# Patient Record
Sex: Male | Born: 1975 | Race: Black or African American | Hispanic: No | Marital: Single | Smoking: Current some day smoker
Health system: Southern US, Community
[De-identification: ages and names within clinical notes are randomized; demographics above are authoritative.]

## PROBLEM LIST (undated history)

## (undated) DIAGNOSIS — F1910 Other psychoactive substance abuse, uncomplicated: Secondary | ICD-10-CM

## (undated) DIAGNOSIS — F32A Depression, unspecified: Secondary | ICD-10-CM

## (undated) DIAGNOSIS — E669 Obesity, unspecified: Secondary | ICD-10-CM

## (undated) DIAGNOSIS — R7989 Other specified abnormal findings of blood chemistry: Secondary | ICD-10-CM

## (undated) DIAGNOSIS — F419 Anxiety disorder, unspecified: Secondary | ICD-10-CM

## (undated) DIAGNOSIS — F1410 Cocaine abuse, uncomplicated: Secondary | ICD-10-CM

## (undated) DIAGNOSIS — G35 Multiple sclerosis: Secondary | ICD-10-CM

## (undated) DIAGNOSIS — R079 Chest pain, unspecified: Secondary | ICD-10-CM

## (undated) DIAGNOSIS — G4730 Sleep apnea, unspecified: Secondary | ICD-10-CM

## (undated) DIAGNOSIS — I509 Heart failure, unspecified: Secondary | ICD-10-CM

## (undated) DIAGNOSIS — F17200 Nicotine dependence, unspecified, uncomplicated: Secondary | ICD-10-CM

## (undated) HISTORY — DX: Cocaine abuse, uncomplicated: F14.10

## (undated) HISTORY — DX: Sleep apnea, unspecified: G47.30

## (undated) HISTORY — DX: Other specified abnormal findings of blood chemistry: R79.89

## (undated) HISTORY — DX: Chest pain, unspecified: R07.9

## (undated) HISTORY — DX: Heart failure, unspecified: I50.9

## (undated) HISTORY — DX: Nicotine dependence, unspecified, uncomplicated: F17.200

## (undated) HISTORY — DX: Depression, unspecified: F32.A

## (undated) HISTORY — DX: Anxiety disorder, unspecified: F41.9

---

## 2001-06-05 ENCOUNTER — Emergency Department (HOSPITAL_COMMUNITY): Admission: EM | Admit: 2001-06-05 | Discharge: 2001-06-06 | Payer: Self-pay | Admitting: Emergency Medicine

## 2001-06-05 ENCOUNTER — Encounter: Payer: Self-pay | Admitting: Emergency Medicine

## 2001-08-30 ENCOUNTER — Emergency Department (HOSPITAL_COMMUNITY): Admission: EM | Admit: 2001-08-30 | Discharge: 2001-08-31 | Payer: Self-pay | Admitting: Emergency Medicine

## 2001-08-31 ENCOUNTER — Other Ambulatory Visit (HOSPITAL_COMMUNITY): Admission: RE | Admit: 2001-08-31 | Discharge: 2001-09-14 | Payer: Self-pay | Admitting: Psychiatry

## 2002-01-28 ENCOUNTER — Emergency Department (HOSPITAL_COMMUNITY): Admission: EM | Admit: 2002-01-28 | Discharge: 2002-01-28 | Payer: Self-pay | Admitting: Emergency Medicine

## 2002-01-28 ENCOUNTER — Encounter: Payer: Self-pay | Admitting: Emergency Medicine

## 2009-05-20 ENCOUNTER — Inpatient Hospital Stay: Payer: Self-pay | Admitting: Internal Medicine

## 2014-12-01 ENCOUNTER — Emergency Department: Payer: Self-pay | Admitting: Emergency Medicine

## 2015-05-25 ENCOUNTER — Encounter: Payer: Self-pay | Admitting: Emergency Medicine

## 2015-05-25 ENCOUNTER — Inpatient Hospital Stay
Admission: EM | Admit: 2015-05-25 | Discharge: 2015-05-28 | DRG: 287 | Disposition: A | Discharge status: Home / Self Care | Payer: Self-pay | Attending: Internal Medicine | Admitting: Internal Medicine

## 2015-05-25 ENCOUNTER — Inpatient Hospital Stay: Admit: 2015-05-25 | Payer: MEDICAID

## 2015-05-25 ENCOUNTER — Inpatient Hospital Stay (HOSPITAL_COMMUNITY)
Admit: 2015-05-25 | Discharge: 2015-05-25 | Disposition: A | Discharge status: Home / Self Care | Payer: Self-pay | Attending: Internal Medicine | Admitting: Internal Medicine

## 2015-05-25 ENCOUNTER — Emergency Department: Payer: Self-pay

## 2015-05-25 DIAGNOSIS — I739 Peripheral vascular disease, unspecified: Principal | ICD-10-CM | POA: Diagnosis present

## 2015-05-25 DIAGNOSIS — I214 Non-ST elevation (NSTEMI) myocardial infarction: Secondary | ICD-10-CM | POA: Diagnosis present

## 2015-05-25 DIAGNOSIS — J9801 Acute bronchospasm: Secondary | ICD-10-CM | POA: Diagnosis present

## 2015-05-25 DIAGNOSIS — Z8249 Family history of ischemic heart disease and other diseases of the circulatory system: Secondary | ICD-10-CM

## 2015-05-25 DIAGNOSIS — F17200 Nicotine dependence, unspecified, uncomplicated: Secondary | ICD-10-CM | POA: Insufficient documentation

## 2015-05-25 DIAGNOSIS — R079 Chest pain, unspecified: Secondary | ICD-10-CM | POA: Insufficient documentation

## 2015-05-25 DIAGNOSIS — R778 Other specified abnormalities of plasma proteins: Secondary | ICD-10-CM | POA: Insufficient documentation

## 2015-05-25 DIAGNOSIS — I209 Angina pectoris, unspecified: Secondary | ICD-10-CM | POA: Insufficient documentation

## 2015-05-25 DIAGNOSIS — I7389 Other specified peripheral vascular diseases: Secondary | ICD-10-CM | POA: Diagnosis present

## 2015-05-25 DIAGNOSIS — R7989 Other specified abnormal findings of blood chemistry: Secondary | ICD-10-CM | POA: Insufficient documentation

## 2015-05-25 DIAGNOSIS — F17210 Nicotine dependence, cigarettes, uncomplicated: Secondary | ICD-10-CM | POA: Diagnosis present

## 2015-05-25 DIAGNOSIS — Z6834 Body mass index (BMI) 34.0-34.9, adult: Secondary | ICD-10-CM

## 2015-05-25 DIAGNOSIS — R739 Hyperglycemia, unspecified: Secondary | ICD-10-CM | POA: Diagnosis present

## 2015-05-25 DIAGNOSIS — I10 Essential (primary) hypertension: Secondary | ICD-10-CM | POA: Diagnosis present

## 2015-05-25 DIAGNOSIS — G35 Multiple sclerosis: Secondary | ICD-10-CM | POA: Diagnosis present

## 2015-05-25 DIAGNOSIS — R748 Abnormal levels of other serum enzymes: Secondary | ICD-10-CM | POA: Diagnosis present

## 2015-05-25 DIAGNOSIS — Z716 Tobacco abuse counseling: Secondary | ICD-10-CM | POA: Diagnosis present

## 2015-05-25 DIAGNOSIS — F1410 Cocaine abuse, uncomplicated: Secondary | ICD-10-CM | POA: Diagnosis present

## 2015-05-25 DIAGNOSIS — F1210 Cannabis abuse, uncomplicated: Secondary | ICD-10-CM | POA: Diagnosis present

## 2015-05-25 HISTORY — DX: Obesity, unspecified: E66.9

## 2015-05-25 HISTORY — DX: Other psychoactive substance abuse, uncomplicated: F19.10

## 2015-05-25 HISTORY — DX: Multiple sclerosis: G35

## 2015-05-25 HISTORY — DX: Non-ST elevation (NSTEMI) myocardial infarction: I21.4

## 2015-05-25 LAB — COMPREHENSIVE METABOLIC PANEL
ALT: 22 U/L (ref 17–63)
AST: 26 U/L (ref 15–41)
Albumin: 3.4 g/dL — ABNORMAL LOW (ref 3.5–5.0)
Alkaline Phosphatase: 51 U/L (ref 38–126)
Anion gap: 13 (ref 5–15)
BUN: 12 mg/dL (ref 6–20)
CO2: 21 mmol/L — ABNORMAL LOW (ref 22–32)
Calcium: 8.7 mg/dL — ABNORMAL LOW (ref 8.9–10.3)
Chloride: 105 mmol/L (ref 101–111)
Creatinine, Ser: 1.07 mg/dL (ref 0.61–1.24)
GFR calc Af Amer: >60 mL/min (ref >60)
GFR calc non Af Amer: >60 mL/min (ref >60)
Glucose, Bld: 168 mg/dL — ABNORMAL HIGH (ref 65–99)
Potassium: 3.2 mmol/L — ABNORMAL LOW (ref 3.5–5.1)
Sodium: 139 mmol/L (ref 135–145)
Total Bilirubin: 0.3 mg/dL (ref 0.3–1.2)
Total Protein: 6.4 g/dL — ABNORMAL LOW (ref 6.5–8.1)

## 2015-05-25 LAB — URINE DRUG SCREEN, QUALITATIVE (ARMC ONLY)
Amphetamines, Ur Screen: NOT DETECTED
BARBITURATES, UR SCREEN: NOT DETECTED
Benzodiazepine, Ur Scrn: NOT DETECTED
COCAINE METABOLITE, UR ~~LOC~~: POSITIVE — AB
Cannabinoid 50 Ng, Ur ~~LOC~~: POSITIVE — AB
MDMA (ECSTASY) UR SCREEN: NOT DETECTED
Methadone Scn, Ur: NOT DETECTED
OPIATE, UR SCREEN: NOT DETECTED
Phencyclidine (PCP) Ur S: NOT DETECTED
Tricyclic, Ur Screen: NOT DETECTED

## 2015-05-25 LAB — TROPONIN I
TROPONIN I: 7.85 ng/mL — AB (ref <0.031)
Troponin I: <0.03 ng/mL (ref <0.031)
Troponin I: 0.13 ng/mL — ABNORMAL HIGH (ref <0.031)

## 2015-05-25 LAB — CBC
HCT: 46.9 % (ref 40.0–52.0)
Hemoglobin: 15.5 g/dL (ref 13.0–18.0)
MCH: 28.1 pg (ref 26.0–34.0)
MCHC: 33 g/dL (ref 32.0–36.0)
MCV: 85 fL (ref 80.0–100.0)
Platelets: 274 10*3/uL (ref 150–440)
RBC: 5.52 MIL/uL (ref 4.40–5.90)
RDW: 14.2 % (ref 11.5–14.5)
WBC: 14.3 10*3/uL — ABNORMAL HIGH (ref 3.8–10.6)

## 2015-05-25 LAB — HEMOGLOBIN A1C: HEMOGLOBIN A1C: 5.2 % (ref 4.0–6.0)

## 2015-05-25 LAB — HEPARIN LEVEL (UNFRACTIONATED): Heparin Unfractionated: 0.18 IU/mL — ABNORMAL LOW (ref 0.30–0.70)

## 2015-05-25 LAB — GLUCOSE, CAPILLARY
Glucose-Capillary: 100 mg/dL — ABNORMAL HIGH (ref 65–99)
Glucose-Capillary: 93 mg/dL (ref 65–99)

## 2015-05-25 LAB — PROTIME-INR
INR: 0.9
PROTHROMBIN TIME: 12.4 s (ref 11.4–15.0)

## 2015-05-25 LAB — APTT: aPTT: 34 seconds (ref 24–36)

## 2015-05-25 MED ORDER — METOPROLOL SUCCINATE ER 25 MG PO TB24
25.0000 mg | ORAL_TABLET | Freq: Every day | ORAL | Status: DC
  Administered 2015-05-25 (×2): 25 mg via ORAL
  Filled 2015-05-25 (×2): qty 1

## 2015-05-25 MED ORDER — ACETAMINOPHEN 650 MG RE SUPP: 650.0000 mg | Freq: Four times a day (QID) | RECTAL | Status: DC | PRN

## 2015-05-25 MED ORDER — POTASSIUM CHLORIDE CRYS ER 20 MEQ PO TBCR
40.0000 meq | EXTENDED_RELEASE_TABLET | ORAL | Status: AC
  Administered 2015-05-25 (×2): 40 meq via ORAL
  Filled 2015-05-25 (×2): qty 2

## 2015-05-25 MED ORDER — NITROGLYCERIN 2 % TD OINT
1.0000 [in_us] | TOPICAL_OINTMENT | Freq: Once | TRANSDERMAL | Status: AC
  Administered 2015-05-25: 1 [in_us] via TOPICAL
  Filled 2015-05-25: qty 1

## 2015-05-25 MED ORDER — HEPARIN BOLUS VIA INFUSION
4000.0000 [IU] | Freq: Once | INTRAVENOUS | Status: AC
  Administered 2015-05-25: 4000 [IU] via INTRAVENOUS
  Filled 2015-05-25: qty 4000

## 2015-05-25 MED ORDER — ACETAMINOPHEN 325 MG PO TABS: 650.0000 mg | ORAL_TABLET | Freq: Four times a day (QID) | ORAL | Status: DC | PRN

## 2015-05-25 MED ORDER — HEPARIN (PORCINE) IN NACL 100-0.45 UNIT/ML-% IJ SOLN
1850.0000 [IU]/h | INTRAMUSCULAR | Status: DC
  Administered 2015-05-25 (×2): 1450 [IU]/h via INTRAVENOUS
  Filled 2015-05-25 (×5): qty 250

## 2015-05-25 MED ORDER — ASPIRIN EC 81 MG PO TBEC
81.0000 mg | DELAYED_RELEASE_TABLET | Freq: Every day | ORAL | Status: DC
  Administered 2015-05-26 – 2015-05-28 (×3): 81 mg via ORAL
  Filled 2015-05-25 (×3): qty 1

## 2015-05-25 MED ORDER — DOCUSATE SODIUM 100 MG PO CAPS
100.0000 mg | ORAL_CAPSULE | Freq: Two times a day (BID) | ORAL | Status: DC
  Administered 2015-05-25 – 2015-05-28 (×3): 100 mg via ORAL
  Filled 2015-05-25 (×4): qty 1

## 2015-05-25 MED ORDER — ZOLPIDEM TARTRATE 5 MG PO TABS
5.0000 mg | ORAL_TABLET | Freq: Every evening | ORAL | Status: DC | PRN
  Administered 2015-05-25: 5 mg via ORAL
  Filled 2015-05-25: qty 1

## 2015-05-25 MED ORDER — SODIUM CHLORIDE 0.9 % IJ SOLN
3.0000 mL | Freq: Two times a day (BID) | INTRAMUSCULAR | Status: DC
  Administered 2015-05-28: 3 mL via INTRAVENOUS

## 2015-05-25 MED ORDER — FAMOTIDINE 20 MG PO TABS
20.0000 mg | ORAL_TABLET | Freq: Once | ORAL | Status: AC
  Administered 2015-05-25: 20 mg via ORAL
  Filled 2015-05-25: qty 1

## 2015-05-25 MED ORDER — INSULIN ASPART 100 UNIT/ML ~~LOC~~ SOLN
0.0000 [IU] | Freq: Three times a day (TID) | SUBCUTANEOUS | Status: DC
  Filled 2015-05-25: qty 3

## 2015-05-25 MED ORDER — GI COCKTAIL ~~LOC~~
30.0000 mL | Freq: Once | ORAL | Status: AC
  Administered 2015-05-25: 30 mL via ORAL
  Filled 2015-05-25: qty 30

## 2015-05-25 MED ORDER — MORPHINE SULFATE 2 MG/ML IJ SOLN: 2.0000 mg | INTRAMUSCULAR | Status: DC | PRN

## 2015-05-25 MED ORDER — HEPARIN BOLUS VIA INFUSION
3400.0000 [IU] | Freq: Once | INTRAVENOUS | Status: AC
  Administered 2015-05-25: 3400 [IU] via INTRAVENOUS
  Filled 2015-05-25: qty 3400

## 2015-05-25 MED ORDER — SODIUM CHLORIDE 0.9 % IV SOLN: 250.0000 mL | INTRAVENOUS | Status: DC | PRN

## 2015-05-25 MED ORDER — NITROGLYCERIN 0.4 MG SL SUBL: 0.4000 mg | SUBLINGUAL_TABLET | SUBLINGUAL | Status: DC | PRN

## 2015-05-25 MED ORDER — ONDANSETRON HCL 4 MG PO TABS: 4.0000 mg | ORAL_TABLET | Freq: Four times a day (QID) | ORAL | Status: DC | PRN

## 2015-05-25 MED ORDER — SODIUM CHLORIDE 0.9 % IJ SOLN: 3.0000 mL | INTRAMUSCULAR | Status: DC | PRN

## 2015-05-25 MED ORDER — PRAVASTATIN SODIUM 20 MG PO TABS
40.0000 mg | ORAL_TABLET | Freq: Every day | ORAL | Status: DC
  Administered 2015-05-25 – 2015-05-26 (×2): 40 mg via ORAL
  Filled 2015-05-25 (×2): qty 2

## 2015-05-25 MED ORDER — ONDANSETRON HCL 4 MG/2ML IJ SOLN: 4.0000 mg | Freq: Four times a day (QID) | INTRAMUSCULAR | Status: DC | PRN

## 2015-05-25 MED ORDER — ASPIRIN 81 MG PO CHEW
324.0000 mg | CHEWABLE_TABLET | Freq: Once | ORAL | Status: AC
  Administered 2015-05-25: 324 mg via ORAL
  Filled 2015-05-25: qty 4

## 2015-05-25 MED ORDER — ALBUTEROL SULFATE (2.5 MG/3ML) 0.083% IN NEBU: 2.5000 mg | INHALATION_SOLUTION | RESPIRATORY_TRACT | Status: DC | PRN

## 2015-05-25 NOTE — ED Provider Notes (Addendum)
[facility] Emergency Department Provider Note  ____________________________________________  Time seen: 8:15 AM  I have reviewed the triage vital signs and the nursing notes.   HISTORY  Chief Complaint Chest Pain     HPI Ricardo Yang is a 38 y.o. male who awoke with chest pain at 5 AM this morning.He was feeling well before bed last night as well as yesterday. The pain is in the center of his chest. It radiates to his back. It is waxing and waning some, but remains present since onset this morning. The severity waxes and wanes between mild and moderate. The patient describes this as a burning and dull ache. It became worse when he lay back on his bed flat. It is not worsened by being upright, ambulating, or moving his arms. He does have some mild nausea but no shortness of breath.  The patient reports he was diagnosed with "possible multiple sclerosis" 5 years ago when he had paresthesias for a few days time. He has had no symptoms since.  Past Medical History  Diagnosis Date  . Multiple sclerosis     There are no active problems to display for this patient.   History reviewed. No pertinent past surgical history.  No current outpatient prescriptions on file.  Allergies Review of patient's allergies indicates no known allergies.  History reviewed. No pertinent family history.  Social History History  Substance Use Topics  . Smoking status: Current Every Day Smoker  . Smokeless tobacco: Not on file  . Alcohol Use: No    Review of Systems  Constitutional: Negative for fever. ENT: Negative for sore throat. Cardiovascular: Positive for chest pain. See history of present illness Respiratory: Negative for shortness of breath. Gastrointestinal: Negative for abdominal pain, vomiting and diarrhea. Genitourinary: Negative for dysuria. Musculoskeletal: No myalgias or injuries. Skin: Negative for rash. Neurological: Negative for  headaches   10-point ROS otherwise negative.  ____________________________________________   PHYSICAL EXAM:  VITAL SIGNS: ED Triage Vitals  Enc Vitals Group     BP 05/25/15 0627 154/94 mmHg     Pulse Rate 05/25/15 0627 81     Resp 05/25/15 0627 28     Temp 05/25/15 0627 97.3 F (36.3 C)     Temp Source 05/25/15 0627 Oral     SpO2 05/25/15 0627 100 %     Weight 05/25/15 0627 281 lb (127.461 kg)     Height 05/25/15 0627  (1.93 m)     Head Cir --      Peak Flow --      Pain Score 05/25/15 0628 8     Pain Loc --      Pain Edu? --      Excl. in GC? --     Constitutional:  Alert and oriented. Calm and communicative with mild discomfort.Marland Kitchen ENT   Head: Normocephalic and atraumatic.   Nose: No congestion/rhinnorhea.   Mouth/Throat: Mucous membranes are moist. Cardiovascular: Normal rate, regular rhythm, no murmur noted Respiratory:  Normal respiratory effort, no tachypnea.    Breath sounds are clear and equal bilaterally.  Chest wall: Nontender. Gastrointestinal: Soft and nontender. No distention.  Back: No muscle spasm, no tenderness, no CVA tenderness. Musculoskeletal: No deformity noted. Nontender with normal range of motion in all extremities.  No noted edema. Neurologic:  Normal speech and language. No gross focal neurologic deficits are appreciated.  Skin:  Skin is warm, dry. No rash noted. Psychiatric: Mood and affect are normal. Speech and behavior are normal.  ____________________________________________    LABS (pertinent positives/negatives)  Labs Reviewed  CBC - Abnormal; Notable for the following:    WBC 14.3 (*)    All other components within normal limits  COMPREHENSIVE METABOLIC PANEL - Abnormal; Notable for the following:    Potassium 3.2 (*)    CO2 21 (*)    Glucose, Bld 168 (*)    Calcium 8.7 (*)    Total Protein 6.4 (*)    Albumin 3.4 (*)    All other components within normal limits  TROPONIN I - Abnormal; Notable for the  following:    Troponin I 0.13 (*)    All other components within normal limits  TROPONIN I     ____________________________________________   EKG  ED ECG REPORT I, Makaelyn Aponte W, the attending physician, personally viewed and interpreted this ECG.   Date: 05/25/2015  EKG Time: 6:33 AM  Rate: 77  Rhythm: Normal sinus rhythm with sinus arrhythmia and incomplete right bundle-branch block  Axis: Normal  Intervals: Normal  ST&T Change: None noted   ED ECG REPORT I, Deniz Eskridge W, the attending physician, personally viewed and interpreted this ECG.   Date: 05/25/2015  EKG Time: 10:54 AM  Rate: 65  Rhythm: Normal sinus rhythm  Axis: Normal  Intervals: Normal  ST&T Change: None noted   ____________________________________________    RADIOLOGY  Chest x-ray:  IMPRESSION: No acute pulmonary process.  ____________________________________________   PROCEDURES  CRITICAL CARE Performed by: Darien Ramus   Total critical care time: 30 min due to the increase involvement and critical care of a non-STEMI situation including discussions with cardiology, internal medicine, and arrangements for admission.  Critical care time was exclusive of separately billable procedures and treating other patients.  Critical care was necessary to treat or prevent imminent or life-threatening deterioration.  Critical care was time spent personally by me on the following activities: development of treatment plan with patient and/or surrogate as well as nursing, discussions with consultants, evaluation of patient's response to treatment, examination of patient, obtaining history from patient or surrogate, ordering and performing treatments and interventions, ordering and review of laboratory studies, ordering and review of radiographic studies, pulse oximetry and re-evaluation of patient's condition.   ____________________________________________   INITIAL IMPRESSION / ASSESSMENT  AND PLAN / ED COURSE  Pertinent labs & imaging results that were available during my care of the patient were reviewed by me and considered in my medical decision making (see chart for details).  39 year old male with chest pain that began while he was sleeping and noted when he awoke at 5 AM. It was worse when he lay down again. It is not worse with activity or ambulation. I suspect the patient may have esophageal irritation.  The patient does smoke cigarettes. We have discussed this as a risk factor for cardiac disease and I have encouraged him to quit.  The patient's father had some heart problems began when he was 41 years old.  The patient's initial EKG did not show any worrisome signs for cardiac ischemia. His initial troponin was negative. We will obtain a second troponin, 6 hours after the onset of symptoms, at 11 AM this morning.  We will treat the patient with a GI cocktail for the suspected esophageal irritation.   ----------------------------------------- 11:46 AM on 05/25/2015 -----------------------------------------  The patient felt better initially after GI cocktail. He slept for a little bit. I reassessed him last at approximately 11:15. At that time he said his chest pain began to come back after  swallowing the famotidine tablet. A repeat EKG at 1054 was normal. A serial troponin measurement was pending at that time.  The second troponin has now returned elevated compared to the first at 0.13.  We will provide aspirin and moved towards admission for this chest pain situation which now more clearly appears to be cardiac.  ----------------------------------------- 12:02 PM on 05/25/2015 -----------------------------------------  Discussed case with Dr. Rachel Moulds. He advises to start heparin and admitted to the hospital as planned. He will discuss the case with Dr. Mariah Milling.  I've called and spoke with Dr. Marlene Bast. He will see the patient the emergency department for  admission. ____________________________________________   FINAL CLINICAL IMPRESSION(S) / ED DIAGNOSES  Final diagnoses:  Non-STEMI (non-ST elevated myocardial infarction)  Ischemic chest pain      Darien Ramus, MD 05/25/15 1204  Darien Ramus, MD 05/25/15 [phone]

## 2015-05-25 NOTE — Consult Note (Signed)
ANTICOAGULATION CONSULT NOTE - Initial Consult  Pharmacy Consult for Heparin Indication: chest pain/ACS  No Known Allergies  Patient Measurements: Height: 6\' 4"  (193 cm) Weight: 281 lb (127.461 kg) IBW/kg (Calculated) : 86.8 Heparin Dosing Weight: 114kg  Vital Signs: Temp: 97.3 F (36.3 C) (07/29 0627) Temp Source: Oral (07/29 0627) BP: 155/94 mmHg (07/29 1153) Pulse Rate: 62 (07/29 1153)  Labs:  Recent Labs  05/25/15 0637 05/25/15 1059  HGB 15.5  --   HCT 46.9  --   PLT 274  --   CREATININE 1.07  --   TROPONINI <0.03 0.13*    Estimated Creatinine Clearance: 132.5 mL/min (by C-G formula based on Cr of 1.07).   Medical History: Past Medical History  Diagnosis Date  . Multiple sclerosis     Medications:  Scheduled:  . [START ON 05/26/2015] aspirin EC  81 mg Oral Daily  . docusate sodium  100 mg Oral BID  . metoprolol succinate  25 mg Oral Daily  . potassium chloride  40 mEq Oral 6 times per day  . pravastatin  40 mg Oral q1800  . sodium chloride  3 mL Intravenous Q12H   Infusions:  . sodium chloride      Assessment: Pt is a 39 year old male with ACS/STEMI Goal of Therapy:  Heparin level 0.3-0.7 units/ml Monitor platelets by anticoagulation protocol: Yes   Plan:  Give 4000 units bolus x 1 Start heparin infusion at 1450 units/hr Check anti-Xa level in 6 hours and daily while on heparin Continue to monitor H&H and platelets  Ricardo Yang D Ricardo Yang 05/25/2015,12:05 PM

## 2015-05-25 NOTE — Progress Notes (Signed)
*  PRELIMINARY RESULTS* Echocardiogram 2D Echocardiogram has been performed.  Garrel Ridgel Stills 05/25/2015, 3:57 PM

## 2015-05-25 NOTE — Progress Notes (Signed)
Notified Dr. Clint Guy of troponin of 7.85. Patient already on heparin drip.

## 2015-05-25 NOTE — Consult Note (Addendum)
Cardiology Consultation Note  Patient ID: Ricardo Yang, MRN: [id number], DOB/AGE: 41/16/1977 41 y.o. Admit date: 05/25/2015   Date of Consult: 05/25/2015 Primary Physician: No PCP Per Patient Primary Cardiologist: New to [facility]  Chief Complaint: Chest pain Reason for Consult: NSTEMI  HPI: 39 y.o. male with h/o polysubstance abuse, obesity, and possible MS who presented to [facility] ED on 7/29 with sudden onset of chest pain and was found to have NSTEMI.   He does not have any previously known cardiac history and has never seen a cardiologist before. No prior echo, stress test, or cardiac catheterization. 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 He does have a history of polysubstance abuse with history of crack/cocaine abuse, most recently snorting cocaine 2 weeks ago. He also has a history ongoing marijuana abuse, most recently using this morning. He has a remote history of ecstasy and pills and reports he would previously use anything other than injection drugs. He previously smoked 2 packs of cigarettes daily and now smokes less than 1 pack daily. He has not seen a doctor in the outpatient setting since at least his high school years.   He was woken up this morning with substernal chest pain that radiated to his left arm. There was associated nausea, vomiting, and diaphoresis. Pain has been at a constant 3/10 and will intermittently increase to an 8/10. Pain is described as a dull ache. He also reports  Some mild nasal congestion that is now improved. Because of this he presented to [facility].   Upon his arrival to [facility] ED he was found to have a troponin of <0.03-->0.13. EKG NSR, 77 bpm, incomplete RBBB, TWI III, aVF, nonspecific st/t changes lateral leads, K+ 3.2, SCr 1.07, WBC 14.3. CXR no acute process. He was given potassium repletion and started on heparin gtt. He as also started on metoprolol, aspirin,  pravastatin, and SSI. His chest pain has currently resolved.       Past Medical History  Diagnosis Date  . Multiple sclerosis     a. question possible MS  . Obesity   . Polysubstance abuse     a. tobacco, cocaine, crack, ecstasy, pills, "anything but injections"      Most Recent Cardiac Studies: None   Surgical History: History reviewed. No pertinent past surgical history.   Home Meds: Prior to Admission medications   Not on File    Inpatient Medications:  . [START ON 05/26/2015] aspirin EC  81 mg Oral Daily  . docusate sodium  100 mg Oral BID  . heparin  4,000 Units Intravenous Once  . insulin aspart  0-9 Units Subcutaneous TID WC  . metoprolol succinate  25 mg Oral Daily  . potassium chloride  40 mEq Oral 6 times per day  . pravastatin  40 mg Oral q1800  . sodium chloride  3 mL Intravenous Q12H   . heparin      Allergies: No Known Allergies  History   Social History  . Marital Status: Single    Spouse Name: N/A  . Number of Children: N/A  . Years of Education: N/A   Occupational History  . Not on file.   Social History Main Topics  . Smoking status: Current Every Day Smoker  . Smokeless tobacco: Not on file  . Alcohol Use: No  . Drug Use: 1.00 per week    Special: "Crack" cocaine, Amphetamines, Cocaine, Hashish, Marijuana,  MDMA (Ecstacy)  . Sexual Activity:    Partners: Female   Other Topics Concern  . Not on file   Social History Narrative     Family History  Problem Relation Age of Onset  . Heart disease Father      Review of Systems: Review of Systems  Constitutional: Positive for weight loss, malaise/fatigue and diaphoresis. Negative for fever and chills.  HENT: Positive for congestion.   Eyes: Negative for blurred vision, discharge and redness.  Respiratory: Negative for cough, hemoptysis, sputum production, shortness of breath and wheezing.   Cardiovascular: Positive for chest pain. Negative for palpitations, orthopnea, claudication,  leg swelling and PND.  Gastrointestinal: Positive for nausea and vomiting. Negative for heartburn and abdominal pain.  Musculoskeletal: Negative for myalgias and falls.  Skin: Negative for rash.  Neurological: Positive for tingling and weakness. Negative for dizziness, tremors, sensory change, speech change, focal weakness, seizures, loss of consciousness and headaches.       Bilateral upper extremity paraesthesias   Endo/Heme/Allergies: Does not bruise/bleed easily.  Psychiatric/Behavioral: Positive for substance abuse. The patient is not nervous/anxious.   All other systems reviewed and are negative.    Labs:  Recent Labs  05/25/15 0637 05/25/15 1059  TROPONINI <0.03 0.13*   Lab Results  Component Value Date   WBC 14.3* 05/25/2015   HGB 15.5 05/25/2015   HCT 46.9 05/25/2015   MCV 85.0 05/25/2015   PLT 274 05/25/2015     Recent Labs Lab 05/25/15 0637  NA 139  K 3.2*  CL 105  CO2 21*  BUN 12  CREATININE 1.07  CALCIUM 8.7*  PROT 6.4*  BILITOT 0.3  ALKPHOS 51  ALT 22  AST 26  GLUCOSE 168*   No results found for: CHOL, HDL, LDLCALC, TRIG No results found for: DDIMER  Radiology/Studies:  Dg Chest 2 View  05/25/2015   CLINICAL DATA:  Chest pain for 2-1/2 hours. Pain radiating to the back. Left arm weakness.  EXAM: CHEST  2 VIEW  COMPARISON:  05/20/2009  FINDINGS: The cardiomediastinal contours are normal. The lungs are clear. Pulmonary vasculature is normal. No consolidation, pleural effusion, or pneumothorax. No acute osseous abnormalities are seen.  IMPRESSION: No acute pulmonary process.   Electronically Signed   By: Rubye Oaks M.D.   On: 05/25/2015 06:58    EKG: NSR, 77 bpm, incomplete RBBB, TWI III, aVF, nonspecific st/t changes lateral leads  Weights: Filed Weights   05/25/15 0627  Weight: 281 lb (127.461 kg)     Physical Exam: Blood pressure 157/90, pulse 83, temperature 98.2 F (36.8 C), temperature source Oral, resp. rate 17, height 6\' 4"   (1.93 m), weight 281 lb (127.461 kg), SpO2 99 %. Body mass index is 34.22 kg/(m^2). General: Well developed, well nourished, in no acute distress. Head: Normocephalic, atraumatic, sclera non-icteric, no xanthomas, nares are without discharge.  Neck: Negative for carotid bruits. JVD not elevated. Lungs: Clear bilaterally to auscultation without wheezes, rales, or rhonchi. Breathing is unlabored. Heart: RRR with S1 S2. No murmurs, rubs, or gallops appreciated. Abdomen: Obese,soft, non-tender, non-distended with normoactive bowel sounds. No hepatomegaly. No rebound/guarding. No obvious abdominal masses. Msk:  Strength and tone appear normal for age. Extremities: No clubbing or cyanosis. No edema.  Distal pedal pulses are 2+ and equal bilaterally. Neuro: Alert and oriented X 3. No facial asymmetry. No focal deficit. Moves all extremities spontaneously. Psych:  Responds to questions appropriately with a normal affect.    Assessment and Plan:  39 y.o. male with  h/o polysubstance abuse, obesity, and possible MS who presented to [facility] ED on 7/29 with sudden onset of chest pain and was found to have NSTEMI.  1. NSTEMI: -Troponin <0.03-->0.13 -Patient with history of cocaine abuse as recent as 2 weeks ago and ongoing marijuana abuse -Discussed with Dr. Kirke Corin, plan for cardiac catheterization per Dr. Windell Hummingbird schedule to evaluate for ACS given patient's presenting symptoms -Other possible etiologies include his polysubstance abuse with remote cocaine abuse and ongoing marijuana abuse and possible cocaine laced marijuana cigarette  -Check stat urine drug screen -Check echo to evaluate LV function and wall motion  -Heparin gtt -Aspirin  -Currently chest pain free -Toprol XL 25 mg daily, titrate as pulse allows  -Add ACEi post cardiac cath -Continue pravastatin  -Check fasting lipid panel and A1C  2. Polysubstance abuse: -Long discussion with patient regarding cessation, he agrees it is time to  quit -Including tobacco, cocaine, marijuana, and remote MDMA, and pills  3. HTN: -Uncontrolled -Toprol XL 25 mg has been added this afternoon, titrate as pulse allows -Follow  4. Hyperglycemia: -A1C -SSI  5. Leukocytosis: -Likely inflammatory  -No signs of infection   6. Obesity: -Weight loss advised   Elinor Dodge, PA-C Pager: [phone] 05/25/2015, 12:50 PM    Attending Note Patient seen and examined, agree with detailed note above,  Patient presentation and plan discussed on rounds.   Acute onset of chest pain starting at 4:30 this morning Minimal elevation of his cardiac enzymes Positive tox screen for marijuana and cocaine Currently pain-free Discussed the various treatment options with him. He prefers examination of his heart is stress testing. He prefers to avoid a invasive procedure. He does have risk factors including more than 20 years of smoking 2 packs a day No significant family history. Denies having diabetes. --Third set of cardiac enzymes pending --We will arrange a treadmill Myoview for tomorrow to rule out ischemia by suspect bump in his cardiac enzymes possibly from drug abuse/cocaine and coronary spasm --Strongly recommended abstaining from drug use. Unable to provide him the details of his positive cocaine study given family in the room  Signed: Dossie Arbour  M.D., Ph.D. [facility]

## 2015-05-25 NOTE — ED Notes (Signed)
Pt states hx of drug abuse, reports smoked weed this morning.

## 2015-05-25 NOTE — H&P (Signed)
[facility] - [place] at [facility]   PATIENT NAME: Ricardo Yang    MR#:  [id number]  DATE OF BIRTH:  31-Aug-1976  DATE OF ADMISSION:  05/25/2015  PRIMARY CARE PHYSICIAN: No PCP Per Patient   REQUESTING/REFERRING PHYSICIAN: Dr. Carollee Massed - ED  CHIEF COMPLAINT:   Chief Complaint  Patient presents with  . Chest Pain    Pt presents to ER alert and in NAD. Pt states SOB this morning and chest pain that started this morning. Pt states he thinks he might be having a panic attack. Pt breathing rapidly.    HISTORY OF PRESENT ILLNESS:  Ricardo Prosser  is a 39 y.o. male with a known history of multiple sclerosis and tobacco abuse presented to the emergency room with on and off chest pain since morning. Symptoms seem to have improved with a GI cocktail. But with persistent symptoms second troponin was done after his first troponin was normal. This has increased to 0.13 suggesting an NSTEMI and patient is being admitted to the hospital. Positive for shortness of breath/nausea and vomiting. No history of CAD. Patient initially thought it was a panic attack. Uses cocaine and marijuana. Last use of cocaine 2 weeks back.  PAST MEDICAL HISTORY:   Past Medical History  Diagnosis Date  . Multiple sclerosis     PAST SURGICAL HISTORY:  History reviewed. No pertinent past surgical history.  SOCIAL HISTORY:   History  Substance Use Topics  . Smoking status: Current Every Day Smoker  . Smokeless tobacco: Not on file  . Alcohol Use: No   Cocaine and Marijuana FAMILY HISTORY:  History reviewed. No pertinent family history.  Father- heart problems - Unknown kind. DRUG ALLERGIES:  No Known Allergies  REVIEW OF SYSTEMS:   Review of Systems  Constitutional: Positive for malaise/fatigue. Negative for fever, chills and weight loss.  HENT: Negative for hearing loss and nosebleeds.   Eyes: Negative for blurred vision, double vision and pain.  Respiratory: Positive for  shortness of breath. Negative for cough, hemoptysis, sputum production and wheezing.   Cardiovascular: Positive for chest pain. Negative for palpitations, orthopnea and leg swelling.  Gastrointestinal: Negative for nausea, vomiting, abdominal pain, diarrhea and constipation.  Genitourinary: Negative for dysuria and hematuria.  Musculoskeletal: Negative for myalgias, back pain and falls.  Skin: Negative for rash.  Neurological: Negative for dizziness, tremors, sensory change, speech change, focal weakness, seizures and headaches.  Endo/Heme/Allergies: Does not bruise/bleed easily.  Psychiatric/Behavioral: Negative for depression and memory loss. The patient is not nervous/anxious.     MEDICATIONS AT HOME:   Prior to Admission medications   Not on File   No home meds    VITAL SIGNS:  Blood pressure 155/94, pulse 62, temperature 97.3 F (36.3 C), temperature source Oral, resp. rate 16, height  (1.93 m), weight 127.461 kg (281 lb), SpO2 96 %.  PHYSICAL EXAMINATION:  Physical Exam  GENERAL:  39 y.o.-year-old patient lying in the bed with no acute distress. Obese. EYES: Pupils equal, round, reactive to light and accommodation. No scleral icterus. Extraocular muscles intact.  HEENT: Head atraumatic, normocephalic. Oropharynx and nasopharynx clear. No oropharyngeal erythema, moist oral mucosa  NECK:  Supple, no jugular venous distention. No thyroid enlargement, no tenderness.  LUNGS: Normal breath sounds bilaterally, no wheezing, rales, rhonchi. No use of accessory muscles of respiration.  CARDIOVASCULAR: S1, S2 normal. No murmurs, rubs, or gallops.  ABDOMEN: Soft, nontender, nondistended. Bowel sounds present. No organomegaly or mass.  EXTREMITIES: No pedal edema, cyanosis,  or clubbing. + 2 pedal & radial pulses b/l.   NEUROLOGIC: Cranial nerves II through XII are intact. No focal Motor or sensory deficits appreciated b/l PSYCHIATRIC: The patient is alert and oriented x 3. Good  affect.  SKIN: No obvious rash, lesion, or ulcer.   LABORATORY PANEL:   CBC  Recent Labs Lab 05/25/15 0637  WBC 14.3*  HGB 15.5  HCT 46.9  PLT 274   ------------------------------------------------------------------------------------------------------------------  Chemistries   Recent Labs Lab 05/25/15 0637  NA 139  K 3.2*  CL 105  CO2 21*  GLUCOSE 168*  BUN 12  CREATININE 1.07  CALCIUM 8.7*  AST 26  ALT 22  ALKPHOS 51  BILITOT 0.3   ------------------------------------------------------------------------------------------------------------------  Cardiac Enzymes  Recent Labs Lab 05/25/15 1059  TROPONINI 0.13*   ------------------------------------------------------------------------------------------------------------------  RADIOLOGY:  Dg Chest 2 View  05/25/2015   CLINICAL DATA:  Chest pain for 2-1/2 hours. Pain radiating to the back. Left arm weakness.  EXAM: CHEST  2 VIEW  COMPARISON:  05/20/2009  FINDINGS: The cardiomediastinal contours are normal. The lungs are clear. Pulmonary vasculature is normal. No consolidation, pleural effusion, or pneumothorax. No acute osseous abnormalities are seen.  IMPRESSION: No acute pulmonary process.   Electronically Signed   By: Rubye Oaks M.D.   On: 05/25/2015 06:58     IMPRESSION AND PLAN:   * NSTEMI ASA, Statin, Toprol, Heparin drip. Echo Consult cardiology for cath.  Cardiac rehabilitation consultation after discharge.  * HTN Start Toprol.  * Hyperglycemia Check HbA1c SSI  * Obesity Needs weight loss. Lifestyle changes counseled  * Leukocytosis, mild Likely reactive No signs of infection  * Tobacco abuse Counseled to quit > 3 minutes  All the records are reviewed and case discussed with ED provider. Management plans discussed with the patient, family and they are in agreement.  CODE STATUS: FULL  TOTAL TIME TAKING CARE OF THIS PATIENT: 40 minutes.    Milagros Loll R M.D on  05/25/2015 at 12:18 PM  Between 7am to 6pm - Pager - [phone]  After 6pm go to [email or web address] - password [email or web address] [facility]  [place]  [facility]  Office  [phone]  CC: Primary care physician; No PCP Per Patient

## 2015-05-25 NOTE — ED Notes (Signed)
MD at the bedside upon arrival of the pt to exam room 25

## 2015-05-25 NOTE — Progress Notes (Signed)
ANTICOAGULATION CONSULT NOTE - Follow Up Consult  Pharmacy Consult for Heparin Indication: chest pain/ACS  No Known Allergies  Patient Measurements: Height: 6\' 4"  (193 cm) Weight: 283 lb 11.2 oz (128.685 kg) IBW/kg (Calculated) : 86.8 Heparin Dosing Weight: 114 kg  Vital Signs: Temp: 98.8 F (37.1 C) (07/29 1917) Temp Source: Oral (07/29 1917) BP: 121/67 mmHg (07/29 1917) Pulse Rate: 75 (07/29 1917)  Labs:  Recent Labs  05/25/15 0637 05/25/15 1059 05/25/15 1235 05/25/15 1854  HGB 15.5  --   --   --   HCT 46.9  --   --   --   PLT 274  --   --   --   APTT  --   --  34  --   LABPROT  --   --  12.4  --   INR  --   --  0.90  --   HEPARINUNFRC  --   --   --  0.18*  CREATININE 1.07  --   --   --   TROPONINI <0.03 0.13*  --   --     Estimated Creatinine Clearance: 133.1 mL/min (by C-G formula based on Cr of 1.07).   Medications:  Scheduled:  . [START ON 05/26/2015] aspirin EC  81 mg Oral Daily  . docusate sodium  100 mg Oral BID  . heparin  3,400 Units Intravenous Once  . insulin aspart  0-9 Units Subcutaneous TID WC  . metoprolol succinate  25 mg Oral Daily  . pravastatin  40 mg Oral q1800  . sodium chloride  3 mL Intravenous Q12H   Infusions:  . heparin 1,450 Units/hr (05/25/15 1300)   PRN: sodium chloride, acetaminophen **OR** acetaminophen, albuterol, morphine injection, nitroGLYCERIN, ondansetron **OR** ondansetron (ZOFRAN) IV, sodium chloride  Assessment: 39 y/o M admitted with ACS/STEMI with heparin level below goal.   Goal of Therapy:  Heparin level 0.3-0.7 units/ml Monitor platelets by anticoagulation protocol: Yes   Plan:  Will bolus heparin 3400 units and increase infusion to 1840 units/hr. Will recheck a HL in 6 hours.   Luisa Hart D 05/25/2015,7:31 PM

## 2015-05-25 NOTE — ED Notes (Signed)
Pt presents to ER alert and in NAD. Pt states SOB this morning and chest pain that started this morning. Pt states he thinks he might be having a panic attack. Pt breathing rapidly.

## 2015-05-25 NOTE — Plan of Care (Signed)
Problem: Consults Goal: Diabetes Guidelines if Diabetic/Glucose > 140 If diabetic or lab glucose is > 140 mg/dl - Initiate Diabetes/Hyperglycemia Guidelines & Document Interventions  Outcome: Completed/Met Date Met:  05/25/15 Pt is alert and oriented x 4, denies chest pain, heparin drip infusing, up in room independently, lives at home with fiance, admits to daily marijuana usage, good appetite, vital signs stable, 2nd troponin elevated, awaiting on third troponin results. A1c within normal range, pt reports that he is otherwise healthy. Fiance at bedside, awaiting on cardiology to consult.

## 2015-05-26 DIAGNOSIS — I214 Non-ST elevation (NSTEMI) myocardial infarction: Secondary | ICD-10-CM

## 2015-05-26 DIAGNOSIS — R079 Chest pain, unspecified: Secondary | ICD-10-CM | POA: Insufficient documentation

## 2015-05-26 LAB — GLUCOSE, CAPILLARY
GLUCOSE-CAPILLARY: 119 mg/dL — AB (ref 65–99)
GLUCOSE-CAPILLARY: 93 mg/dL (ref 65–99)
GLUCOSE-CAPILLARY: 102 mg/dL — AB (ref 65–99)
Glucose-Capillary: 101 mg/dL — ABNORMAL HIGH (ref 65–99)

## 2015-05-26 LAB — HEPARIN LEVEL (UNFRACTIONATED)
Heparin Unfractionated: 0.68 IU/mL (ref 0.30–0.70)
Heparin Unfractionated: 0.78 IU/mL — ABNORMAL HIGH (ref 0.30–0.70)
Heparin Unfractionated: 0.48 IU/mL (ref 0.30–0.70)

## 2015-05-26 LAB — LIPID PANEL
CHOL/HDL RATIO: 4.5 ratio
Cholesterol: 138 mg/dL (ref 0–200)
HDL: 31 mg/dL — ABNORMAL LOW (ref >40)
LDL Cholesterol: 85 mg/dL (ref 0–99)
Triglycerides: 109 mg/dL (ref <150)
VLDL: 22 mg/dL (ref 0–40)

## 2015-05-26 LAB — CBC
HCT: 42.8 % (ref 40.0–52.0)
HEMOGLOBIN: 13.9 g/dL (ref 13.0–18.0)
MCH: 28 pg (ref 26.0–34.0)
MCHC: 32.6 g/dL (ref 32.0–36.0)
MCV: 85.9 fL (ref 80.0–100.0)
PLATELETS: 257 10*3/uL (ref 150–440)
RBC: 4.98 MIL/uL (ref 4.40–5.90)
RDW: 14 % (ref 11.5–14.5)
WBC: 13.6 10*3/uL — ABNORMAL HIGH (ref 3.8–10.6)

## 2015-05-26 LAB — HEMOGLOBIN A1C: HEMOGLOBIN A1C: 5.7 % (ref 4.0–6.0)

## 2015-05-26 MED ORDER — SODIUM CHLORIDE 0.9 % IJ SOLN: 3.0000 mL | INTRAMUSCULAR | Status: DC | PRN

## 2015-05-26 MED ORDER — SODIUM CHLORIDE 0.9 % WEIGHT BASED INFUSION
1.0000 mL/kg/h | INTRAVENOUS | Status: DC
  Administered 2015-05-28: 1 mL/kg/h via INTRAVENOUS

## 2015-05-26 MED ORDER — ASPIRIN 81 MG PO CHEW: 81.0000 mg | CHEWABLE_TABLET | ORAL | Status: AC

## 2015-05-26 MED ORDER — SODIUM CHLORIDE 0.9 % IJ SOLN
3.0000 mL | Freq: Two times a day (BID) | INTRAMUSCULAR | Status: DC
  Administered 2015-05-28: 3 mL via INTRAVENOUS

## 2015-05-26 MED ORDER — HEPARIN (PORCINE) IN NACL 100-0.45 UNIT/ML-% IJ SOLN
1950.0000 [IU]/h | INTRAMUSCULAR | Status: DC
  Administered 2015-05-27 – 2015-05-28 (×2): 1700 [IU]/h via INTRAVENOUS
  Filled 2015-05-26 (×8): qty 250

## 2015-05-26 MED ORDER — SODIUM CHLORIDE 0.9 % IV SOLN: 250.0000 mL | INTRAVENOUS | Status: DC | PRN

## 2015-05-26 MED ORDER — SODIUM CHLORIDE 0.9 % WEIGHT BASED INFUSION
3.0000 mL/kg/h | INTRAVENOUS | Status: AC
  Administered 2015-05-28: 3 mL/kg/h via INTRAVENOUS

## 2015-05-26 NOTE — Progress Notes (Addendum)
Patient: Ricardo Yang / Admit Date: 05/25/2015 / Date of Encounter: 05/26/2015, 11:50 AM   Subjective: No further chest pain. Troponin up to 7.85 this morning. Stress test canceled. Echo normal study. On heparin gtt. Drug screen positive for marijuana and cocaine.   Review of Systems: Review of Systems  Constitutional: Negative.  Negative for fever, chills, weight loss, malaise/fatigue and diaphoresis.  Eyes: Negative for discharge and redness.  Respiratory: Negative.  Negative for cough, hemoptysis, sputum production, shortness of breath and wheezing.   Cardiovascular: Negative.  Negative for chest pain, palpitations, orthopnea, claudication, leg swelling and PND.  Gastrointestinal: Negative.  Negative for heartburn, nausea, vomiting, blood in stool and melena.  Genitourinary: Negative for hematuria.  Musculoskeletal: Negative.  Negative for falls.  Skin: Negative for rash.  Neurological: Negative.  Negative for sensory change, speech change, focal weakness and weakness.  Endo/Heme/Allergies: Does not bruise/bleed easily.  Psychiatric/Behavioral: Positive for substance abuse. The patient is not nervous/anxious.   All other systems reviewed and are negative.    Objective: Telemetry: NSR70's Physical Exam: Blood pressure 132/79, pulse 69, temperature 98.5 F (36.9 C), temperature source Oral, resp. rate 20, height 6\' 4"  (1.93 m), weight 283 lb 4.8 oz (128.504 kg), SpO2 99 %. Body mass index is 34.5 kg/(m^2). General: Well developed, well nourished, in no acute distress. Head: Normocephalic, atraumatic, sclera non-icteric, no xanthomas, nares are without discharge. Neck: Negative for carotid bruits. JVP not elevated. Lungs: Clear bilaterally to auscultation without wheezes, rales, or rhonchi. Breathing is unlabored. Heart: RRR S1 S2 without murmurs, rubs, or gallops.  Abdomen: Soft, non-tender, non-distended with normoactive bowel sounds. No rebound/guarding. Extremities: No  clubbing or cyanosis. No edema. Distal pedal pulses are 2+ and equal bilaterally. Neuro: Alert and oriented X 3. Moves all extremities spontaneously. Psych:  Responds to questions appropriately with a normal affect.   Intake/Output Summary (Last 24 hours) at 05/26/15 1150 Last data filed at 05/26/15 0830  Gross per 24 hour  Intake 649.91 ml  Output    550 ml  Net  99.91 ml    Inpatient Medications:  . aspirin EC  81 mg Oral Daily  . docusate sodium  100 mg Oral BID  . insulin aspart  0-9 Units Subcutaneous TID WC  . pravastatin  40 mg Oral q1800  . sodium chloride  3 mL Intravenous Q12H   Infusions:  . heparin 1,850 Units/hr (05/25/15 1931)    Labs:  Recent Labs  05/25/15 0637  NA 139  K 3.2*  CL 105  CO2 21*  GLUCOSE 168*  BUN 12  CREATININE 1.07  CALCIUM 8.7*    Recent Labs  05/25/15 0637  AST 26  ALT 22  ALKPHOS 51  BILITOT 0.3  PROT 6.4*  ALBUMIN 3.4*    Recent Labs  05/25/15 0637 05/26/15 0505  WBC 14.3* 13.6*  HGB 15.5 13.9  HCT 46.9 42.8  MCV 85.0 85.9  PLT 274 257    Recent Labs  05/25/15 0637 05/25/15 1059 05/25/15 2116  TROPONINI <0.03 0.13* 7.85*   Invalid input(s): POCBNP  Recent Labs  05/25/15 0637  HGBA1C 5.2     Weights: Filed Weights   05/25/15 0627 05/25/15 1253 05/26/15 0600  Weight: 281 lb (127.461 kg) 283 lb 11.2 oz (128.685 kg) 283 lb 4.8 oz (128.504 kg)     Radiology/Studies:  Dg Chest 2 View  05/25/2015   CLINICAL DATA:  Chest pain for 2-1/2 hours. Pain radiating to the back. Left arm weakness.  EXAM: CHEST  2 VIEW  COMPARISON:  05/20/2009  FINDINGS: The cardiomediastinal contours are normal. The lungs are clear. Pulmonary vasculature is normal. No consolidation, pleural effusion, or pneumothorax. No acute osseous abnormalities are seen.  IMPRESSION: No acute pulmonary process.   Electronically Signed   By: Rubye Oaks M.D.   On: 05/25/2015 06:58     Assessment and Plan  39 y.o. male with h/o  polysubstance abuse, obesity, and possible MS who presented to [facility] ED on 7/29 with sudden onset of chest pain and was found to have NSTEMI.  1. NSTEMI: -Troponin <0.03-->0.13-->7.85 -Plan for cardiac cath on 8/1 -Urine drug screen positive for cocaine and marijuana  -Patient with history of cocaine abuse as recent as 2 weeks ago and ongoing marijuana abuse -Possible etiologies include ACS/ischemia vs coronary vasospasm in the setting of cocaine abuse- -Echo normal  -Heparin gtt -Aspirin  -Currently chest pain free -Toprol XL 25 mg daily, titrate as pulse allows  -Add ACEi post cardiac cath -Continue pravastatin  -Lipid panel with LDL of 85 -A1C pending -Risks and benefits of cardiac catheterization have been discussed with the patient including risks of bleeding, bruising, infection, kidney damage, stroke, heart attack, and death. The patient understands these risks and is willing to proceed with the procedure. All questions have been answered and concerns listened to.   2. Polysubstance abuse: -Long discussion with patient regarding cessation, he agrees it is time to quit -Including tobacco, cocaine, marijuana, and remote MDMA, and pills  3. HTN: -Improving -Toprol XL 25 mg has been added this afternoon, titrate as pulse allows -Follow  4. Hyperglycemia: -A1C -SSI  5. Leukocytosis: -Likely inflammatory  -No signs of infection   6. Obesity: -Weight loss advised   SignedEula Listen, PA-C Pager: [phone] 05/26/2015, 11:50 AM    Attending Note Patient seen and examined, agree with detailed note above,  Patient presentation and plan discussed on rounds.    Third troponin of 7 Stress test for this morning was canceled given the climb in cardiac enzyme Currently with no significant chest pain overnight or this morning Scheduled for cardiac catheterization on Monday, August 1 first case We'll make npo that morning Risk and benefit discussed with him. He  is aware of risk of stroke and heart attack. Previously discussed risk and benefit yesterday as well --Suspect he may have underlying coronary disease, exacerbated by recent cocaine use. --Would continue heparin until cardiac enzymes trending down   Signed: Dossie Arbour  M.D., Ph.D. [facility]

## 2015-05-26 NOTE — Progress Notes (Signed)
ANTICOAGULATION CONSULT NOTE - Follow Up Consult  Pharmacy Consult for Heparin Indication: chest pain/ACS  No Known Allergies  Patient Measurements: Height:  (193 cm) Weight: 283 lb 11.2 oz (128.685 kg) IBW/kg (Calculated) : 86.8 Heparin Dosing Weight: 114 kg  Vital Signs: Temp: 98.8 F (37.1 C) (07/29 1917) Temp Source: Oral (07/29 1917) BP: 121/67 mmHg (07/29 1917) Pulse Rate: 75 (07/29 1917)  Labs:  Recent Labs  05/25/15 0637 05/25/15 1059 05/25/15 1235 05/25/15 1854 05/25/15 2116 05/26/15 0505  HGB 15.5  --   --   --   --  13.9  HCT 46.9  --   --   --   --  42.8  PLT 274  --   --   --   --  257  APTT  --   --  34  --   --   --   LABPROT  --   --  12.4  --   --   --   INR  --   --  0.90  --   --   --   HEPARINUNFRC  --   --   --  0.18*  --  0.68  CREATININE 1.07  --   --   --   --   --   TROPONINI <0.03 0.13*  --   --  7.85*  --     Estimated Creatinine Clearance: 133.1 mL/min (by C-G formula based on Cr of 1.07).   Medications:  Scheduled:  . aspirin EC  81 mg Oral Daily  . docusate sodium  100 mg Oral BID  . insulin aspart  0-9 Units Subcutaneous TID WC  . pravastatin  40 mg Oral q1800  . sodium chloride  3 mL Intravenous Q12H   Infusions:  . heparin 1,850 Units/hr (05/25/15 1931)   PRN: sodium chloride, acetaminophen **OR** acetaminophen, albuterol, morphine injection, nitroGLYCERIN, ondansetron **OR** ondansetron (ZOFRAN) IV, sodium chloride, zolpidem  Assessment: 39 y/o M admitted with ACS/STEMI with heparin level below goal.   Goal of Therapy:  Heparin level 0.3-0.7 units/ml Monitor platelets by anticoagulation protocol: Yes   Plan:  Will bolus heparin 3400 units and increase infusion to 1840 units/hr. Will recheck a HL in 6 hours.   7/30 AM anti-Xa 0.68.  Continue current regimen. Recheck in 6 hours to confirm.  Vue Pavon S 05/26/2015,5:34 AM

## 2015-05-26 NOTE — Progress Notes (Addendum)
ANTICOAGULATION CONSULT NOTE - Follow Up Consult  Pharmacy Consult for Heparin Indication: chest pain/ACS  No Known Allergies  Patient Measurements: Height:  (193 cm) Weight: 283 lb 4.8 oz (128.504 kg) IBW/kg (Calculated) : 86.8 Heparin Dosing Weight: 114 kg  Vital Signs: Temp: 98.2 F (36.8 C) (07/30 1939) Temp Source: Oral (07/30 1939) BP: 141/88 mmHg (07/30 1939) Pulse Rate: 76 (07/30 1939)  Labs:  Recent Labs  05/25/15 0637 05/25/15 1059 05/25/15 1235  05/25/15 2116 05/26/15 0505 05/26/15 1258 05/26/15 2045  HGB 15.5  --   --   --   --  13.9  --   --   HCT 46.9  --   --   --   --  42.8  --   --   PLT 274  --   --   --   --  257  --   --   APTT  --   --  34  --   --   --   --   --   LABPROT  --   --  12.4  --   --   --   --   --   INR  --   --  0.90  --   --   --   --   --   HEPARINUNFRC  --   --   --   < >  --  0.68 0.78* 0.48  CREATININE 1.07  --   --   --   --   --   --   --   TROPONINI <0.03 0.13*  --   --  7.85*  --   --   --   < > = values in this interval not displayed.  Estimated Creatinine Clearance: 133 mL/min (by C-G formula based on Cr of 1.07).   Medications:  Scheduled:  . [START ON 05/27/2015] aspirin  81 mg Oral Pre-Cath  . aspirin EC  81 mg Oral Daily  . docusate sodium  100 mg Oral BID  . insulin aspart  0-9 Units Subcutaneous TID WC  . pravastatin  40 mg Oral q1800  . sodium chloride  3 mL Intravenous Q12H  . [START ON 05/28/2015] sodium chloride  3 mL Intravenous Q12H   Infusions:  . [START ON 05/28/2015] sodium chloride     Followed by  . [START ON 05/28/2015] sodium chloride    . heparin 1,700 Units/hr (05/26/15 1415)   PRN: sodium chloride, [START ON 05/28/2015] sodium chloride, acetaminophen **OR** acetaminophen, albuterol, morphine injection, nitroGLYCERIN, ondansetron **OR** ondansetron (ZOFRAN) IV, sodium chloride, sodium chloride, zolpidem  Assessment: 39 y/o M admitted with ACS/STEMI with heparin level below goal.   Goal of  Therapy:  Heparin level 0.3-0.7 units/ml Monitor platelets by anticoagulation protocol: Yes   Plan:  Will bolus heparin 3400 units and increase infusion to 1840 units/hr. Will recheck a HL in 6 hours.   7/30 @ 12:58, anti-Xa = 0.78. Decreased drip rate to 1700 units/hr.  Will recheck in 6 hours at 20:15.       HL @ 20:15 = 0.48.   Will continue this pt on current drip rate on 1700 units/hr and draw confirmation level on 7/31 @ 2:00.  Robbins,Jason D 05/26/2015,9:32 PM     7/31 anti-Xa 0.4. Continue current regimen. CBC and anti-Xa tomorrow AM.  Fulton Reek, PharmD, BCPS  05/27/2015

## 2015-05-26 NOTE — Progress Notes (Signed)
[facility] Physicians - [place] at [facility]                                                                                                                                                                                            Patient Demographics   Ricardo Yang, is a 39 y.o. male, DOB - 1976-07-13, FAO:[id number]  Admit date - 05/25/2015   Admitting Physician Milagros Loll, MD  Outpatient Primary MD for the patient is No PCP Per Patient   LOS - 1  Subjective: Patient admitted with chest pain noted to have troponin in the 7 range. He was also cocaine positive No further chest pains    Review of Systems:   CONSTITUTIONAL: No documented fever. No fatigue, weakness. No weight gain, no weight loss.  EYES: No blurry or double vision.  ENT: No tinnitus. No postnasal drip. No redness of the oropharynx.  RESPIRATORY: No cough, no wheeze, no hemoptysis. No dyspnea.  CARDIOVASCULAR: No chest pain. No orthopnea. No palpitations. No syncope.  GASTROINTESTINAL: No nausea, no vomiting or diarrhea. No abdominal pain. No melena or hematochezia.  GENITOURINARY: No dysuria or hematuria.  ENDOCRINE: No polyuria or nocturia. No heat or cold intolerance.  HEMATOLOGY: No anemia. No bruising. No bleeding.  INTEGUMENTARY: No rashes. No lesions.  MUSCULOSKELETAL: No arthritis. No swelling. No gout.  NEUROLOGIC: No numbness, tingling, or ataxia. No seizure-type activity.  PSYCHIATRIC: No anxiety. No insomnia. No ADD.    Vitals:   Filed Vitals:   05/26/15 0600 05/26/15 0640 05/26/15 0745 05/26/15 1201  BP:  120/77 132/79 140/80  Pulse:  70 69 71  Temp:  98.5 F (36.9 C)  98 F (36.7 C)  TempSrc:  Oral  Oral  Resp:  Height:      Weight: 128.504 kg (283 lb 4.8 oz)     SpO2:  96% 99% 100%    Wt Readings from Last 3 Encounters:  05/26/15 128.504 kg (283 lb 4.8 oz)     Intake/Output Summary (Last 24 hours) at 05/26/15 1224 Last data filed at 05/26/15 1100  Gross  per 24 hour  Intake 649.91 ml  Output    550 ml  Net  99.91 ml    Physical Exam:   GENERAL: Pleasant-appearing in no apparent distress.  HEAD, EYES, EARS, NOSE AND THROAT: Atraumatic, normocephalic. Extraocular muscles are intact. Pupils equal and reactive to light. Sclerae anicteric. No conjunctival injection. No oro-pharyngeal erythema.  NECK: Supple. There is no jugular venous distention. No bruits, no lymphadenopathy, no thyromegaly.  HEART: Regular rate and rhythm, tachycardic. No murmurs, no rubs, no clicks.  LUNGS: Clear to auscultation bilaterally. No rales or rhonchi. No wheezes.  ABDOMEN: Soft, flat, nontender, nondistended. Has good bowel sounds. No hepatosplenomegaly appreciated.  EXTREMITIES: No evidence of any cyanosis, clubbing, or peripheral edema.  +2 pedal and radial pulses bilaterally.  NEUROLOGIC: The patient is alert, awake, and oriented x3 with no focal motor or sensory deficits appreciated bilaterally.  SKIN: Moist and warm with no rashes appreciated.  Psych: Not anxious, depressed LN: No inguinal LN enlargement    Antibiotics   Anti-infectives    None      Medications   Scheduled Meds: . [START ON 05/27/2015] aspirin  81 mg Oral Pre-Cath  . aspirin EC  81 mg Oral Daily  . docusate sodium  100 mg Oral BID  . insulin aspart  0-9 Units Subcutaneous TID WC  . pravastatin  40 mg Oral q1800  . sodium chloride  3 mL Intravenous Q12H  . [START ON 05/28/2015] sodium chloride  3 mL Intravenous Q12H   Continuous Infusions: . [START ON 05/28/2015] sodium chloride     Followed by  . [START ON 05/28/2015] sodium chloride    . heparin 1,850 Units/hr (05/25/15 1931)   PRN Meds:.sodium chloride, [START ON 05/28/2015] sodium chloride, acetaminophen **OR** acetaminophen, albuterol, morphine injection, nitroGLYCERIN, ondansetron **OR** ondansetron (ZOFRAN) IV, sodium chloride, sodium chloride, zolpidem   Data Review:   Micro Results No results found for this or any  previous visit (from the past 240 hour(s)).  Radiology Reports Dg Chest 2 View  05/25/2015   CLINICAL DATA:  Chest pain for 2-1/2 hours. Pain radiating to the back. Left arm weakness.  EXAM: CHEST  2 VIEW  COMPARISON:  05/20/2009  FINDINGS: The cardiomediastinal contours are normal. The lungs are clear. Pulmonary vasculature is normal. No consolidation, pleural effusion, or pneumothorax. No acute osseous abnormalities are seen.  IMPRESSION: No acute pulmonary process.   Electronically Signed   By: Rubye Oaks M.D.   On: 05/25/2015 06:58     CBC  Recent Labs Lab 05/25/15 0637 05/26/15 0505  WBC 14.3* 13.6*  HGB 15.5 13.9  HCT 46.9 42.8  PLT 274 257  MCV 85.0 85.9  MCH 28.1 28.0  MCHC 33.0 32.6  RDW 14.2 14.0    Chemistries   Recent Labs Lab 05/25/15 0637  NA 139  K 3.2*  CL 105  CO2 21*  GLUCOSE 168*  BUN 12  CREATININE 1.07  CALCIUM 8.7*  AST 26  ALT 22  ALKPHOS 51  BILITOT 0.3   ------------------------------------------------------------------------------------------------------------------ estimated creatinine clearance is 133 mL/min (by C-G formula based on Cr of 1.07). ------------------------------------------------------------------------------------------------------------------  Recent Labs  05/25/15 0637  HGBA1C 5.2   ------------------------------------------------------------------------------------------------------------------  Recent Labs  05/26/15 0505  CHOL 138  HDL 31*  LDLCALC 85  TRIG 078  CHOLHDL 4.5   ------------------------------------------------------------------------------------------------------------------ No results for input(s): TSH, T4TOTAL, T3FREE, THYROIDAB in the last 72 hours.  Invalid input(s): FREET3 ------------------------------------------------------------------------------------------------------------------ No results for input(s): VITAMINB12, FOLATE, FERRITIN, TIBC, IRON, RETICCTPCT in the last 72  hours.  Coagulation profile  Recent Labs Lab 05/25/15 1235  INR 0.90    No results for input(s): DDIMER in the last 72 hours.  Cardiac Enzymes  Recent Labs Lab 05/25/15 6754 05/25/15 1059 05/25/15 2116  TROPONINI <0.03 0.13* 7.85*   ------------------------------------------------------------------------------------------------------------------ Invalid input(s): POCBNP    Assessment & Plan   * NSTEMI Continue ASA, Statin, Toprol, Heparin drip. Echo shows normal EF Cardiac catheter on Monday Possible cocaine induced vasospasm  * HTN Continue Toprol.  *  Hyperglycemia Hemoglobin A1c pending SSI  * Obesity Needs weight loss. Lifestyle changes counseled  * Leukocytosis, mild Likely reactive No signs of infection  * Tobacco abuse     Code Status Orders        Start     Ordered   05/25/15 1204  Full code   Continuous     05/25/15 1205           Consults cardiology   DVT Prophylaxis heparin  Lab Results  Component Value Date   PLT 257 05/26/2015     Time Spent in minutes   Greater than 50% of time spent in care coordination and counseling.   [name] M.D on 05/26/2015 at 12:24 PM  Between 7am to 6pm - Pager - [phone]  After 6pm go to [email or web address] - password [email or web address] [facility]  [facility] Hospitalists   Office  [phone]

## 2015-05-26 NOTE — Progress Notes (Signed)
ANTICOAGULATION CONSULT NOTE - Follow Up Consult  Pharmacy Consult for Heparin Indication: chest pain/ACS  No Known Allergies  Patient Measurements: Height:  (193 cm) Weight: 283 lb 4.8 oz (128.504 kg) IBW/kg (Calculated) : 86.8 Heparin Dosing Weight: 114 kg  Vital Signs: Temp: 98 F (36.7 C) (07/30 1201) Temp Source: Oral (07/30 1201) BP: 140/80 mmHg (07/30 1201) Pulse Rate: 71 (07/30 1201)  Labs:  Recent Labs  05/25/15 0637 05/25/15 1059 05/25/15 1235 05/25/15 1854 05/25/15 2116 05/26/15 0505 05/26/15 1258  HGB 15.5  --   --   --   --  13.9  --   HCT 46.9  --   --   --   --  42.8  --   PLT 274  --   --   --   --  257  --   APTT  --   --  34  --   --   --   --   LABPROT  --   --  12.4  --   --   --   --   INR  --   --  0.90  --   --   --   --   HEPARINUNFRC  --   --   --  0.18*  --  0.68 0.78*  CREATININE 1.07  --   --   --   --   --   --   TROPONINI <0.03 0.13*  --   --  7.85*  --   --     Estimated Creatinine Clearance: 133 mL/min (by C-G formula based on Cr of 1.07).   Medications:  Scheduled:  . [START ON 05/27/2015] aspirin  81 mg Oral Pre-Cath  . aspirin EC  81 mg Oral Daily  . docusate sodium  100 mg Oral BID  . insulin aspart  0-9 Units Subcutaneous TID WC  . pravastatin  40 mg Oral q1800  . sodium chloride  3 mL Intravenous Q12H  . [START ON 05/28/2015] sodium chloride  3 mL Intravenous Q12H   Infusions:  . [START ON 05/28/2015] sodium chloride     Followed by  . [START ON 05/28/2015] sodium chloride    . heparin     PRN: sodium chloride, [START ON 05/28/2015] sodium chloride, acetaminophen **OR** acetaminophen, albuterol, morphine injection, nitroGLYCERIN, ondansetron **OR** ondansetron (ZOFRAN) IV, sodium chloride, sodium chloride, zolpidem  Assessment: 39 y/o M admitted with ACS/STEMI with heparin level below goal.   Goal of Therapy:  Heparin level 0.3-0.7 units/ml Monitor platelets by anticoagulation protocol: Yes   Plan:  Will bolus  heparin 3400 units and increase infusion to 1840 units/hr. Will recheck a HL in 6 hours.   7/30 @ 12:58, anti-Xa = 0.78. Decreased drip rate to 1700 units/hr.  Will recheck in 6 hours at 20:15.  Ira Busbin K 05/26/2015,2:12 PM

## 2015-05-26 NOTE — Progress Notes (Signed)
A&O. Independent. No complaints through the night. Consent for stress test in chart. Heparin drip infusing. RA. SR on tele. Telemetry box verified.

## 2015-05-27 LAB — GLUCOSE, CAPILLARY
GLUCOSE-CAPILLARY: 97 mg/dL (ref 65–99)
Glucose-Capillary: 91 mg/dL (ref 65–99)
Glucose-Capillary: 105 mg/dL — ABNORMAL HIGH (ref 65–99)
Glucose-Capillary: 108 mg/dL — ABNORMAL HIGH (ref 65–99)

## 2015-05-27 LAB — TROPONIN I: Troponin I: 2.09 ng/mL — ABNORMAL HIGH (ref <0.031)

## 2015-05-27 LAB — HEPARIN LEVEL (UNFRACTIONATED): HEPARIN UNFRACTIONATED: 0.4 [IU]/mL (ref 0.30–0.70)

## 2015-05-27 NOTE — Progress Notes (Signed)
Patient: Ricardo Yang / Admit Date: 05/25/2015 / Date of Encounter: 05/27/2015, 7:34 AM   Subjective: Feels well this morning. No further chest pain. Troponin pending this morning. He is for cardiac cath on 8/1. On heparin gtt.   Review of Systems: Review of Systems  Constitutional: Negative for fever, chills, weight loss, malaise/fatigue and diaphoresis.  HENT: Negative for congestion.   Eyes: Negative for blurred vision, double vision, photophobia, discharge and redness.  Respiratory: Negative for cough, hemoptysis, sputum production, shortness of breath and wheezing.   Cardiovascular: Negative for chest pain, palpitations, orthopnea, claudication, leg swelling and PND.  Gastrointestinal: Negative for heartburn, nausea, vomiting, abdominal pain, blood in stool and melena.  Genitourinary: Negative for hematuria.  Musculoskeletal: Negative for myalgias and falls.  Skin: Negative for rash.  Neurological: Negative for sensory change, speech change, focal weakness, weakness and headaches.  Endo/Heme/Allergies: Does not bruise/bleed easily.  Psychiatric/Behavioral: Positive for substance abuse. The patient is not nervous/anxious.   All other systems reviewed and are negative.    Objective: Telemetry: NSR, 70's, 4 beats NSVT Physical Exam: Blood pressure 117/61, pulse 70, temperature 98 F (36.7 C), temperature source Oral, resp. rate 22, height 6\' 4"  (1.93 m), weight 287 lb 1.6 oz (130.228 kg), SpO2 99 %. Body mass index is 34.96 kg/(m^2). General: Well developed, well nourished, in no acute distress. Head: Normocephalic, atraumatic, sclera non-icteric, no xanthomas, nares are without discharge. Neck: Negative for carotid bruits. JVP not elevated. Lungs: Clear bilaterally to auscultation without wheezes, rales, or rhonchi. Breathing is unlabored. Heart: RRR S1 S2 without murmurs, rubs, or gallops.  Abdomen: Soft, non-tender, non-distended with normoactive bowel sounds. No  rebound/guarding. Extremities: No clubbing or cyanosis. No edema. Distal pedal pulses are 2+ and equal bilaterally. Neuro: Alert and oriented X 3. Moves all extremities spontaneously. Psych:  Responds to questions appropriately with a normal affect.   Intake/Output Summary (Last 24 hours) at 05/27/15 0734 Last data filed at 05/27/15 0640  Gross per 24 hour  Intake 582.02 ml  Output   1000 ml  Net -417.98 ml    Inpatient Medications:  . aspirin  81 mg Oral Pre-Cath  . aspirin EC  81 mg Oral Daily  . docusate sodium  100 mg Oral BID  . insulin aspart  0-9 Units Subcutaneous TID WC  . pravastatin  40 mg Oral q1800  . sodium chloride  3 mL Intravenous Q12H  . [START ON 05/28/2015] sodium chloride  3 mL Intravenous Q12H   Infusions:  . [START ON 05/28/2015] sodium chloride     Followed by  . [START ON 05/28/2015] sodium chloride    . heparin 1,700 Units/hr (05/26/15 1415)    Labs:  Recent Labs  05/25/15 0637  NA 139  K 3.2*  CL 105  CO2 21*  GLUCOSE 168*  BUN 12  CREATININE 1.07  CALCIUM 8.7*    Recent Labs  05/25/15 0637  AST 26  ALT 22  ALKPHOS 51  BILITOT 0.3  PROT 6.4*  ALBUMIN 3.4*    Recent Labs  05/25/15 0637 05/26/15 0505  WBC 14.3* 13.6*  HGB 15.5 13.9  HCT 46.9 42.8  MCV 85.0 85.9  PLT 274 257    Recent Labs  05/25/15 0637 05/25/15 1059 05/25/15 2116  TROPONINI <0.03 0.13* 7.85*   Invalid input(s): POCBNP  Recent Labs  05/26/15 0505  HGBA1C 5.7     Weights: Filed Weights   05/25/15 1253 05/26/15 0600 05/27/15 0422  Weight: 283 lb 11.2  oz (128.685 kg) 283 lb 4.8 oz (128.504 kg) 287 lb 1.6 oz (130.228 kg)     Radiology/Studies:  Dg Chest 2 View  05/25/2015   CLINICAL DATA:  Chest pain for 2-1/2 hours. Pain radiating to the back. Left arm weakness.  EXAM: CHEST  2 VIEW  COMPARISON:  05/20/2009  FINDINGS: The cardiomediastinal contours are normal. The lungs are clear. Pulmonary vasculature is normal. No consolidation, pleural  effusion, or pneumothorax. No acute osseous abnormalities are seen.  IMPRESSION: No acute pulmonary process.   Electronically Signed   By: Rubye Oaks M.D.   On: 05/25/2015 06:58     Assessment and Plan  39 y.o. male with h/o polysubstance abuse, obesity, and possible MS who presented to [facility] ED on 7/29 with sudden onset of chest pain and was found to have NSTEMI.  1. NSTEMI: -Troponin <0.03-->0.13-->7.85-->pending -Plan for cardiac cath on 8/1 -Urine drug screen positive for cocaine and marijuana  -Patient with history of cocaine abuse as recent as 2 weeks ago and ongoing marijuana abuse -Possible etiologies include ACS/ischemia vs coronary vasospasm in the setting of cocaine abuse- -Echo normal  -Heparin gtt, continue at this time -Aspirin  -Currently chest pain free -Toprol XL 25 mg daily, titrate as pulse allows  -Add ACEi post cardiac cath -Continue pravastatin  -Lipid panel with LDL of 85 -A1C pending -Risks and benefits of cardiac catheterization have been discussed with the patient including risks of bleeding, bruising, infection, kidney damage, stroke, heart attack, and death. The patient understands these risks and is willing to proceed with the procedure. All questions have been answered and concerns listened to.   2. Polysubstance abuse: -Long discussion with patient regarding cessation, he agrees it is time to quit -Including tobacco, cocaine, marijuana, and remote MDMA, and pills  3. HTN: -Improving -Toprol XL 25 mg has been added this afternoon, titrate as pulse allows -Follow  4. Hyperglycemia: -A1C -SSI  5. Leukocytosis: -Likely inflammatory  -No signs of infection   6. Obesity: -Weight loss advised  Elinor Dodge, PA-C Pager: [phone] 05/27/2015, 7:34 AM

## 2015-05-27 NOTE — Progress Notes (Signed)
[facility] Physicians - [place] at [facility]                                                                                                                                                                                            Patient Demographics   Ricardo Yang, is a 39 y.o. male, DOB - 1976/04/30, ZOX:[id number]  Admit date - 05/25/2015   Admitting Physician Milagros Loll, MD  Outpatient Primary MD for the patient is No PCP Per Patient   LOS - 2  Subjective: Denies any chest pain or shortness of breath  Review of Systems:   CONSTITUTIONAL: No documented fever. No fatigue, weakness. No weight gain, no weight loss.  EYES: No blurry or double vision.  ENT: No tinnitus. No postnasal drip. No redness of the oropharynx.  RESPIRATORY: No cough, no wheeze, no hemoptysis. No dyspnea.  CARDIOVASCULAR: No chest pain. No orthopnea. No palpitations. No syncope.  GASTROINTESTINAL: No nausea, no vomiting or diarrhea. No abdominal pain. No melena or hematochezia.  GENITOURINARY: No dysuria or hematuria.  ENDOCRINE: No polyuria or nocturia. No heat or cold intolerance.  HEMATOLOGY: No anemia. No bruising. No bleeding.  INTEGUMENTARY: No rashes. No lesions.  MUSCULOSKELETAL: No arthritis. No swelling. No gout.  NEUROLOGIC: No numbness, tingling, or ataxia. No seizure-type activity.  PSYCHIATRIC: No anxiety. No insomnia. No ADD.    Vitals:   Filed Vitals:   05/26/15 0745 05/26/15 1201 05/26/15 1939 05/27/15 0422  BP: 132/79 140/80 141/88 117/61  Pulse: 69 71 76 70  Temp:  98 F (36.7 C) 98.2 F (36.8 C) 98 F (36.7 C)  TempSrc:  Oral Oral Oral  Resp: 20 18 18 22   Height:      Weight:    130.228 kg (287 lb 1.6 oz)  SpO2: 99% 100% 100% 99%    Wt Readings from Last 3 Encounters:  05/27/15 130.228 kg (287 lb 1.6 oz)     Intake/Output Summary (Last 24 hours) at 05/27/15 1100 Last data filed at 05/27/15 0830  Gross per 24 hour  Intake 462.02 ml  Output   1000 ml   Net -537.98 ml    Physical Exam:   GENERAL: Pleasant-appearing in no apparent distress.  HEAD, EYES, EARS, NOSE AND THROAT: Atraumatic, normocephalic. Extraocular muscles are intact. Pupils equal and reactive to light. Sclerae anicteric. No conjunctival injection. No oro-pharyngeal erythema.  NECK: Supple. There is no jugular venous distention. No bruits, no lymphadenopathy, no thyromegaly.  HEART: Regular rate and rhythm, tachycardic. No murmurs, no rubs, no clicks.  LUNGS: Clear to auscultation bilaterally. No rales or rhonchi. No wheezes.  ABDOMEN: Soft, flat,  nontender, nondistended. Has good bowel sounds. No hepatosplenomegaly appreciated.  EXTREMITIES: No evidence of any cyanosis, clubbing, or peripheral edema.  +2 pedal and radial pulses bilaterally.  NEUROLOGIC: The patient is alert, awake, and oriented x3 with no focal motor or sensory deficits appreciated bilaterally.  SKIN: Moist and warm with no rashes appreciated.  Psych: Not anxious, depressed LN: No inguinal LN enlargement    Antibiotics   Anti-infectives    None      Medications   Scheduled Meds: . aspirin  81 mg Oral Pre-Cath  . aspirin EC  81 mg Oral Daily  . docusate sodium  100 mg Oral BID  . insulin aspart  0-9 Units Subcutaneous TID WC  . pravastatin  40 mg Oral q1800  . sodium chloride  3 mL Intravenous Q12H  . [START ON 05/28/2015] sodium chloride  3 mL Intravenous Q12H   Continuous Infusions: . [START ON 05/28/2015] sodium chloride     Followed by  . [START ON 05/28/2015] sodium chloride    . heparin 1,700 Units/hr (05/27/15 0812)   PRN Meds:.sodium chloride, [START ON 05/28/2015] sodium chloride, acetaminophen **OR** acetaminophen, albuterol, morphine injection, nitroGLYCERIN, ondansetron **OR** ondansetron (ZOFRAN) IV, sodium chloride, sodium chloride, zolpidem   Data Review:   Micro Results No results found for this or any previous visit (from the past 240 hour(s)).  Radiology Reports Dg Chest  2 View  05/25/2015   CLINICAL DATA:  Chest pain for 2-1/2 hours. Pain radiating to the back. Left arm weakness.  EXAM: CHEST  2 VIEW  COMPARISON:  05/20/2009  FINDINGS: The cardiomediastinal contours are normal. The lungs are clear. Pulmonary vasculature is normal. No consolidation, pleural effusion, or pneumothorax. No acute osseous abnormalities are seen.  IMPRESSION: No acute pulmonary process.   Electronically Signed   By: Rubye Oaks M.D.   On: 05/25/2015 06:58     CBC  Recent Labs Lab 05/25/15 0637 05/26/15 0505  WBC 14.3* 13.6*  HGB 15.5 13.9  HCT 46.9 42.8  PLT 274 257  MCV 85.0 85.9  MCH 28.1 28.0  MCHC 33.0 32.6  RDW 14.2 14.0    Chemistries   Recent Labs Lab 05/25/15 0637  NA 139  K 3.2*  CL 105  CO2 21*  GLUCOSE 168*  BUN 12  CREATININE 1.07  CALCIUM 8.7*  AST 26  ALT 22  ALKPHOS 51  BILITOT 0.3   ------------------------------------------------------------------------------------------------------------------ estimated creatinine clearance is 133.9 mL/min (by C-G formula based on Cr of 1.07). ------------------------------------------------------------------------------------------------------------------  Recent Labs  05/25/15 0637 05/26/15 0505  HGBA1C 5.2 5.7   ------------------------------------------------------------------------------------------------------------------  Recent Labs  05/26/15 0505  CHOL 138  HDL 31*  LDLCALC 85  TRIG 409  CHOLHDL 4.5   ------------------------------------------------------------------------------------------------------------------ No results for input(s): TSH, T4TOTAL, T3FREE, THYROIDAB in the last 72 hours.  Invalid input(s): FREET3 ------------------------------------------------------------------------------------------------------------------ No results for input(s): VITAMINB12, FOLATE, FERRITIN, TIBC, IRON, RETICCTPCT in the last 72 hours.  Coagulation profile  Recent Labs Lab  05/25/15 1235  INR 0.90    No results for input(s): DDIMER in the last 72 hours.  Cardiac Enzymes  Recent Labs Lab 05/25/15 1059 05/25/15 2116 05/27/15 0752  TROPONINI 0.13* 7.85* 2.09*   ------------------------------------------------------------------------------------------------------------------ Invalid input(s): POCBNP    Assessment & Plan   * NSTEMI Continue ASA, Statin, Toprol, Heparin drip. Echo shows normal EF Cardiac catheter tomorrow Possible cocaine induced vasospasm  * HTN Continue Toprol.  * Hyperglycemia Hemoglobin A1c 5.7 SSI  * Obesity Needs weight loss. Lifestyle changes counseled  *  Leukocytosis, mild Likely reactive no fevers No signs of infection  * Tobacco abuse     Code Status Orders        Start     Ordered   05/25/15 1204  Full code   Continuous     05/25/15 1205           Consults cardiology   DVT Prophylaxis heparin  Lab Results  Component Value Date   PLT 257 05/26/2015     Time Spent in minutes  to 32 min    [name] M.D on 05/27/2015 at 11:00 AM  Between 7am to 6pm - Pager - [phone]  After 6pm go to [email or web address] - password [email or web address] [facility]  [facility] Hospital[facility]   Office  [phone]

## 2015-05-28 ENCOUNTER — Encounter
Admission: EM | Disposition: A | Discharge status: Home / Self Care | Payer: Self-pay | Source: Home / Self Care | Attending: Internal Medicine

## 2015-05-28 ENCOUNTER — Encounter: Payer: Self-pay | Admitting: *Deleted

## 2015-05-28 HISTORY — PX: CARDIAC CATHETERIZATION: SHX172

## 2015-05-28 LAB — BASIC METABOLIC PANEL
Anion gap: 6 (ref 5–15)
BUN: 11 mg/dL (ref 6–20)
CO2: 26 mmol/L (ref 22–32)
Calcium: 8.6 mg/dL — ABNORMAL LOW (ref 8.9–10.3)
Chloride: 106 mmol/L (ref 101–111)
Creatinine, Ser: 0.88 mg/dL (ref 0.61–1.24)
GLUCOSE: 95 mg/dL (ref 65–99)
Potassium: 3.6 mmol/L (ref 3.5–5.1)
SODIUM: 138 mmol/L (ref 135–145)

## 2015-05-28 LAB — HEPARIN LEVEL (UNFRACTIONATED): Heparin Unfractionated: 0.24 IU/mL — ABNORMAL LOW (ref 0.30–0.70)

## 2015-05-28 LAB — CBC
HCT: 44.2 % (ref 40.0–52.0)
HEMOGLOBIN: 14.5 g/dL (ref 13.0–18.0)
MCH: 28.3 pg (ref 26.0–34.0)
MCHC: 32.7 g/dL (ref 32.0–36.0)
MCV: 86.3 fL (ref 80.0–100.0)
Platelets: 260 10*3/uL (ref 150–440)
RBC: 5.12 MIL/uL (ref 4.40–5.90)
RDW: 13.9 % (ref 11.5–14.5)
WBC: 10.9 10*3/uL — AB (ref 3.8–10.6)

## 2015-05-28 LAB — GLUCOSE, CAPILLARY: Glucose-Capillary: 76 mg/dL (ref 65–99)

## 2015-05-28 SURGERY — LEFT HEART CATH AND CORONARY ANGIOGRAPHY
Anesthesia: Moderate Sedation

## 2015-05-28 MED ORDER — IOHEXOL 300 MG/ML  SOLN
INTRAMUSCULAR | Status: DC | PRN
  Administered 2015-05-28: 100 mL via INTRA_ARTERIAL

## 2015-05-28 MED ORDER — SODIUM CHLORIDE 0.9 % IJ SOLN: 3.0000 mL | INTRAMUSCULAR | Status: DC | PRN

## 2015-05-28 MED ORDER — FENTANYL CITRATE (PF) 100 MCG/2ML IJ SOLN
INTRAMUSCULAR | Status: DC | PRN
  Administered 2015-05-28: 50 ug via INTRAVENOUS

## 2015-05-28 MED ORDER — ACETAMINOPHEN 325 MG PO TABS: 650.0000 mg | ORAL_TABLET | ORAL | Status: DC | PRN

## 2015-05-28 MED ORDER — MIDAZOLAM HCL 2 MG/2ML IJ SOLN
INTRAMUSCULAR | Status: AC
  Filled 2015-05-28: qty 2

## 2015-05-28 MED ORDER — SODIUM CHLORIDE 0.9 % IJ SOLN: 3.0000 mL | Freq: Two times a day (BID) | INTRAMUSCULAR | Status: DC

## 2015-05-28 MED ORDER — HEPARIN BOLUS VIA INFUSION
1700.0000 [IU] | Freq: Once | INTRAVENOUS | Status: AC
  Administered 2015-05-28: 1700 [IU] via INTRAVENOUS
  Filled 2015-05-28: qty 1700

## 2015-05-28 MED ORDER — ONDANSETRON HCL 4 MG/2ML IJ SOLN: 4.0000 mg | Freq: Four times a day (QID) | INTRAMUSCULAR | Status: DC | PRN

## 2015-05-28 MED ORDER — SODIUM CHLORIDE 0.9 % WEIGHT BASED INFUSION: 3.0000 mL/kg/h | INTRAVENOUS | Status: DC

## 2015-05-28 MED ORDER — HEPARIN (PORCINE) IN NACL 2-0.9 UNIT/ML-% IJ SOLN
INTRAMUSCULAR | Status: AC
  Filled 2015-05-28: qty 1000

## 2015-05-28 MED ORDER — MIDAZOLAM HCL 2 MG/2ML IJ SOLN
INTRAMUSCULAR | Status: DC | PRN
  Administered 2015-05-28: 1 mg via INTRAVENOUS

## 2015-05-28 MED ORDER — SODIUM CHLORIDE 0.9 % IV SOLN: 250.0000 mL | INTRAVENOUS | Status: DC | PRN

## 2015-05-28 MED ORDER — FENTANYL CITRATE (PF) 100 MCG/2ML IJ SOLN
INTRAMUSCULAR | Status: AC
  Filled 2015-05-28: qty 2

## 2015-05-28 SURGICAL SUPPLY — 10 items
CATH INFINITI 5FR ANG PIGTAIL (CATHETERS) ×3 IMPLANT
CATH INFINITI 5FR JL4 (CATHETERS) ×3 IMPLANT
CATH INFINITI JR4 5F (CATHETERS) ×3 IMPLANT
DEVICE CLOSURE MYNXGRIP 5F (Vascular Products) ×3 IMPLANT
KIT MANI 3VAL PERCEP (MISCELLANEOUS) ×3 IMPLANT
NEEDLE PERC 18GX7CM (NEEDLE) ×3 IMPLANT
NEEDLE SMART 18G ACCESS (NEEDLE) IMPLANT
PACK CARDIAC CATH (CUSTOM PROCEDURE TRAY) ×3 IMPLANT
SHEATH AVANTI 5FR X 11CM (SHEATH) ×3 IMPLANT
WIRE EMERALD 3MM-J .035X150CM (WIRE) ×3 IMPLANT

## 2015-05-28 NOTE — Progress Notes (Signed)
Report to simone telemetry.  Check right groin for bleeding or hematoma.  Patient will be on bedrest for 2 hours post sheath pull---out of bed at 09:50.  Bilateral pulses are 1's DP's.Marland Kitchen

## 2015-05-28 NOTE — Progress Notes (Signed)
Patient was discharge home as per order, discharge instruction provided, IV removed tele removed  

## 2015-05-28 NOTE — Progress Notes (Signed)
Patient returned to unit from specials, alert and oriented, denies any pain , VSS , right groin minx closure dry and intact , will continue to monitor.

## 2015-05-28 NOTE — Progress Notes (Signed)
[facility] Physicians - [place] at [facility]        [name] was admitted to the Hospital on 05/25/2015 and Discharged  05/28/2015 and should be excused from work/school   for7  days starting 05/25/2015 , may return to work/school without any restrictions.  Call  [employer] with questions.  [name] M.D on 05/28/2015,at 10:36 AM  [facility] Physicians - [place] at [facility]  [phone]

## 2015-05-28 NOTE — Progress Notes (Signed)
ANTICOAGULATION CONSULT NOTE - Follow Up Consult  Pharmacy Consult for Heparin Indication: chest pain/ACS  No Known Allergies  Patient Measurements: Height:  (193 cm) Weight: 287 lb 1.6 oz (130.228 kg) IBW/kg (Calculated) : 86.8 Heparin Dosing Weight: 114 kg  Vital Signs: Temp: 98.1 F (36.7 C) (08/01 0355) Temp Source: Oral (07/31 1942) BP: 121/66 mmHg (08/01 0355) Pulse Rate: 69 (08/01 0355)  Labs:  Recent Labs  05/25/15 1610 05/25/15 1059 05/25/15 1235  05/25/15 2116 05/26/15 0505  05/26/15 2045 05/27/15 0309 05/27/15 0752 05/28/15 0348  HGB 15.5  --   --   --   --  13.9  --   --   --   --  14.5  HCT 46.9  --   --   --   --  42.8  --   --   --   --  44.2  PLT 274  --   --   --   --  257  --   --   --   --  260  APTT  --   --  34  --   --   --   --   --   --   --   --   LABPROT  --   --  12.4  --   --   --   --   --   --   --   --   INR  --   --  0.90  --   --   --   --   --   --   --   --   HEPARINUNFRC  --   --   --   < >  --  0.68  < > 0.48 0.40  --  0.24*  CREATININE 1.07  --   --   --   --   --   --   --   --   --   --   TROPONINI <0.03 0.13*  --   --  7.85*  --   --   --   --  2.09*  --   < > = values in this interval not displayed.  Estimated Creatinine Clearance: 133.9 mL/min (by C-G formula based on Cr of 1.07).   Medications:  Scheduled:  . aspirin  81 mg Oral Pre-Cath  . aspirin EC  81 mg Oral Daily  . docusate sodium  100 mg Oral BID  . heparin  1,700 Units Intravenous Once  . insulin aspart  0-9 Units Subcutaneous TID WC  . pravastatin  40 mg Oral q1800  . sodium chloride  3 mL Intravenous Q12H  . sodium chloride  3 mL Intravenous Q12H   Infusions:  . sodium chloride 1 mL/kg/hr (05/28/15 0324)  . heparin 1,700 Units/hr (05/28/15 0201)   PRN: sodium chloride, sodium chloride, acetaminophen **OR** acetaminophen, albuterol, morphine injection, nitroGLYCERIN, ondansetron **OR** ondansetron (ZOFRAN) IV, sodium chloride, sodium chloride,  zolpidem  Assessment: 39 y/o M admitted with ACS/STEMI with heparin level below goal.   Goal of Therapy:  Heparin level 0.3-0.7 units/ml Monitor platelets by anticoagulation protocol: Yes   Plan:  Will bolus heparin 3400 units and increase infusion to 1840 units/hr. Will recheck a HL in 6 hours.   7/30 @ 12:58, anti-Xa = 0.78. Decreased drip rate to 1700 units/hr.  Will recheck in 6 hours at 20:15.       HL @ 20:15 = 0.48.   Will continue this pt on  current drip rate on 1700 units/hr and draw confirmation level on 7/31 @ 2:00.  Tacara Hadlock S 05/28/2015,4:51 AM     7/31 anti-Xa 0.4. Continue current regimen. CBC and anti-Xa tomorrow AM.  8/1 AM anti-Xa 0.24. 1700 unit bolus and increase rate to 1950 units/hr. Recheck in 6 hours.   Fulton Reek, PharmD, BCPS  05/28/2015

## 2015-05-28 NOTE — Discharge Summary (Signed)
Ricardo Yang, 39 y.o., DOB [birth year], MRN [id number]. Admission date: 05/25/2015 Discharge Date 05/28/2015 Primary MD No PCP Per Patient Admitting Physician Milagros Loll, MD  Admission Diagnosis  Non-STEMI (non-ST elevated myocardial infarction) [I21.4] Ischemic chest pain [I20.9]  Discharge Diagnosis   Active Problems:    Elevated troponin felt to be due to bronchospasm from cocaine use no evidence of obstructive disease on cardiac catheter   Elevated troponin   Vasospasm due to cocaine induced chest pain   Cocaine abuse   Heavy smoker  Abnormal lipid profile  Morbid obesity Multiple sclerosis      Hospital Course  Ricardo Yang is a 39 y.o. male with a known history of multiple sclerosis and tobacco abuse as well as cocaine use presented with the complaint of chest pain. Patient's troponin was elevated in the emergency room. His troponin continued to trend upwards it was some concern about non-ST MI. Patient was admitted to the hospital and and was seen by cardiology. He underwent cardiac catheterization this morning. Which shows no significant coronary artery disease to explain the elevated troponin. His chest pain and troponin elevation was felt to be due to vasospasm as result of cocaine use. Patient was counseled strongly regarding this cocaine abuse and nicotine addiction. He was also recommended for weight loss           Consults  cardiology  Significant Tests:  See full reports for all details    Dg Chest 2 View  05/25/2015   CLINICAL DATA:  Chest pain for 2-1/2 hours. Pain radiating to the back. Left arm weakness.  EXAM: CHEST  2 VIEW  COMPARISON:  05/20/2009  FINDINGS: The cardiomediastinal contours are normal. The lungs are clear. Pulmonary vasculature is normal. No consolidation, pleural effusion, or pneumothorax. No acute osseous abnormalities are seen.  IMPRESSION: No acute pulmonary process.   Electronically Signed   By: Rubye Oaks M.D.   On: 05/25/2015 06:58        Today   Subjective:   Ricardo Yang  feels well today denies any chest pain or shortness of breath  Objective:   Blood pressure 156/97, pulse 60, temperature 98.1 F (36.7 C), temperature source Oral, resp. rate 19, height  (1.93 m), weight 129.729 kg (286 lb), SpO2 100 %.  .  Intake/Output Summary (Last 24 hours) at 05/28/15 1151 Last data filed at 05/27/15 1748  Gross per 24 hour  Intake    240 ml  Output    300 ml  Net    -60 ml    Exam VITAL SIGNS: Blood pressure 156/97, pulse 60, temperature 98.1 F (36.7 C), temperature source Oral, resp. rate 19, height  (1.93 m), weight 129.729 kg (286 lb), SpO2 100 %.  GENERAL:  39 y.o.-year-old patient lying in the bed with no acute distress.  EYES: Pupils equal, round, reactive to light and accommodation. No scleral icterus. Extraocular muscles intact.  HEENT: Head atraumatic, normocephalic. Oropharynx and nasopharynx clear.  NECK:  Supple, no jugular venous distention. No thyroid enlargement, no tenderness.  LUNGS: Normal breath sounds bilaterally, no wheezing, rales,rhonchi or crepitation. No use of accessory muscles of respiration.  CARDIOVASCULAR: S1, S2 normal. No murmurs, rubs, or gallops.  ABDOMEN: Soft, nontender, nondistended. Bowel sounds present. No organomegaly or mass.  EXTREMITIES: No pedal edema, cyanosis, or clubbing.  NEUROLOGIC: Cranial nerves II through XII are intact. Muscle strength 5/5 in all extremities. Sensation intact. Gait not checked.  PSYCHIATRIC: The patient is alert and oriented x 3.  SKIN: No obvious rash, lesion, or ulcer.   Data Review     CBC w Diff: Lab Results  Component Value Date   WBC 10.9* 05/28/2015   HGB 14.5 05/28/2015   HCT 44.2 05/28/2015   PLT 260 05/28/2015   CMP: Lab Results  Component Value Date   NA 138 05/28/2015   K 3.6 05/28/2015   CL 106 05/28/2015   CO2 26 05/28/2015   BUN 11 05/28/2015   CREATININE 0.88 05/28/2015   PROT 6.4* 05/25/2015    ALBUMIN 3.4* 05/25/2015   BILITOT 0.3 05/25/2015   ALKPHOS 51 05/25/2015   AST 26 05/25/2015   ALT 22 05/25/2015  .  Micro Results No results found for this or any previous visit (from the past 240 hour(s)).      Code Status Orders        Start     Ordered   05/28/15 1016  Full code   Continuous     05/28/15 1020          Follow-up Information    Follow up with pcp In 7 days.      Discharge Medications     Medication List    Notice    You have not been prescribed any medications.         Total Time in preparing paper work, data evaluation and todays exam - 35 minutes  [name] M.D on 05/28/2015 at 11:51 AM  [facility]   Office  [phone]

## 2015-05-28 NOTE — Discharge Instructions (Signed)
°  DIET:  Regular diet  DISCHARGE CONDITION:  Good  ACTIVITY:  Activity as tolerated  OXYGEN:  Home Oxygen: No.   Oxygen Delivery: room air  DISCHARGE LOCATION:  home    ADDITIONAL DISCHARGE INSTRUCTION:stay off work for 5 days   If you experience worsening of your admission symptoms, develop shortness of breath, life threatening emergency, suicidal or homicidal thoughts you must seek medical attention immediately by calling 911 or calling your MD immediately  if symptoms less severe.  You Must read complete instructions/literature along with all the possible adverse reactions/side effects for all the Medicines you take and that have been prescribed to you. Take any new Medicines after you have completely understood and accpet all the possible adverse reactions/side effects.   Please note  You were cared for by a hospitalist during your hospital stay. If you have any questions about your discharge medications or the care you received while you were in the hospital after you are discharged, you can call the unit and asked to speak with the hospitalist on call if the hospitalist that took care of you is not available. Once you are discharged, your primary care physician will handle any further medical issues. Please note that NO REFILLS for any discharge medications will be authorized once you are discharged, as it is imperative that you return to your primary care physician (or establish a relationship with a primary care physician if you do not have one) for your aftercare needs so that they can reassess your need for medications and monitor your lab values.

## 2015-05-28 NOTE — Progress Notes (Signed)
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 Referring from smoking and recreational drugs as explained to him. Outpatient follow-up with primary care

## 2015-05-29 ENCOUNTER — Encounter: Payer: Self-pay | Admitting: Cardiovascular Disease

## 2017-11-09 ENCOUNTER — Other Ambulatory Visit: Payer: Self-pay

## 2017-11-09 ENCOUNTER — Encounter: Payer: Self-pay | Admitting: Emergency Medicine

## 2017-11-09 ENCOUNTER — Emergency Department
Admission: EM | Admit: 2017-11-09 | Discharge: 2017-11-09 | Disposition: A | Discharge status: Home / Self Care | Payer: Self-pay | Attending: Emergency Medicine | Admitting: Emergency Medicine

## 2017-11-09 DIAGNOSIS — I252 Old myocardial infarction: Secondary | ICD-10-CM | POA: Insufficient documentation

## 2017-11-09 DIAGNOSIS — F17200 Nicotine dependence, unspecified, uncomplicated: Secondary | ICD-10-CM | POA: Insufficient documentation

## 2017-11-09 DIAGNOSIS — K047 Periapical abscess without sinus: Secondary | ICD-10-CM | POA: Insufficient documentation

## 2017-11-09 MED ORDER — IBUPROFEN 600 MG PO TABS: 600.0000 mg | ORAL_TABLET | Freq: Three times a day (TID) | ORAL | 0 refills | Status: DC | PRN

## 2017-11-09 MED ORDER — AMOXICILLIN 500 MG PO CAPS: 500.0000 mg | ORAL_CAPSULE | Freq: Three times a day (TID) | ORAL | 0 refills | Status: DC

## 2017-11-09 NOTE — ED Provider Notes (Signed)
[facility] Emergency Department Provider Note  ____________________________________________   First MD Initiated Contact with Patient 11/09/17 [phone]     (approximate)  I have reviewed the triage vital signs and the nursing notes.   HISTORY  Chief Complaint No chief complaint on file.   HPI Ricardo Yang is a 42 y.o. male is here with complaint of left upper facial and dental pain.  He also complains of his upper lip being swollen with his facial pain.  He states that he broke his front tooth a couple weeks ago and the swelling began today.  He denies any fever or chills.   Past Medical History:  Diagnosis Date  . Multiple sclerosis (HCC)    a. question possible MS  . Obesity   . Polysubstance abuse (HCC)    a. tobacco, cocaine, crack, ecstasy, pills, "anything but injections"    Patient Active Problem List   Diagnosis Date Noted  . Chest pain   . NSTEMI (non-ST elevated myocardial infarction) (HCC) 05/25/2015  . Elevated troponin   . Ischemic chest pain   . Cocaine abuse (HCC)   . Heavy smoker     Past Surgical History:  Procedure Laterality Date  . CARDIAC CATHETERIZATION N/A 05/28/2015   Procedure: Left Heart Cath and Coronary Angiography;  Surgeon: Antonieta Iba, MD;  Location: [facility] INVASIVE CV LAB;  Service: Cardiovascular;  Laterality: N/A;    Prior to Admission medications   Medication Sig Start Date End Date Taking? Authorizing Provider  amoxicillin (AMOXIL) 500 MG capsule Take 1 capsule (500 mg total) by mouth 3 (three) times daily. 11/09/17   Tommi Rumps, PA-C  ibuprofen (ADVIL,MOTRIN) 600 MG tablet Take 1 tablet (600 mg total) by mouth every 8 (eight) hours as needed. 11/09/17   Tommi Rumps, PA-C    Allergies Patient has no known allergies.  Family History  Problem Relation Age of Onset  . Heart disease Father     Social History Social History   Tobacco Use  . Smoking status: Current Every Day Smoker  .  Smokeless tobacco: Never Used  Substance Use Topics  . Alcohol use: No    Alcohol/week: 0.0 oz  . Drug use: Yes    Frequency: 1.0 times per week    Types: "Crack" cocaine, Amphetamines, Cocaine, Hashish, Marijuana, MDMA (Ecstacy)    Review of Systems Constitutional: No fever/chills Eyes: No visual changes. ENT: Positive dental pain. Cardiovascular: Denies chest pain. Respiratory: Denies shortness of breath. ____________________________________________   PHYSICAL EXAM:  VITAL SIGNS: ED Triage Vitals  Enc Vitals Group     BP 11/09/17 0815 (!) 181/110     Pulse Rate 11/09/17 0815 76     Resp 11/09/17 0815 20     Temp 11/09/17 0815 98.4 F (36.9 C)     Temp Source 11/09/17 0815 Oral     SpO2 11/09/17 0815 97 %     Weight 11/09/17 0815 270 lb (122.5 kg)     Height 11/09/17 0815 6' 3.75" (1.924 m)     Head Circumference --      Peak Flow --      Pain Score 11/09/17 0824 0     Pain Loc --      Pain Edu? --      Excl. in GC? --    Constitutional: Alert and oriented. Well appearing and in no acute distress. Eyes: Conjunctivae are normal.  Head: Atraumatic. Nose: No congestion/rhinnorhea. Mouth/Throat: Mucous membranes are moist.  Oropharynx  non-erythematous.  #8 tooth is broken off at the gum.  Gum is swollen but no obvious drainage is noted. Neck: No stridor.  Hematological/Lymphatic/Immunilogical: No cervical lymphadenopathy. Cardiovascular: Normal rate, regular rhythm. Grossly normal heart sounds.  Good peripheral circulation. Respiratory: Normal respiratory effort.  No retractions. Lungs CTAB. Neurologic:  Normal speech and language. No gross focal neurologic deficits are appreciated.  Skin:  Skin is warm, dry and intact.  Psychiatric: Mood and affect are normal. Speech and behavior are normal.  ____________________________________________   LABS (all labs ordered are listed, but only abnormal results are displayed)  Labs Reviewed - No data to  display  PROCEDURES  Procedure(s) performed: None  Procedures  Critical Care performed: No  ____________________________________________   INITIAL IMPRESSION / ASSESSMENT AND PLAN / ED COURSE Patient was given a list of dental clinics to follow-up with.  He was discharged with a prescription for amoxicillin 500 mg 3 times daily for 10 days and ibuprofen 600 mg 3 times daily with food.  Patient's blood pressure was elevated and he is instructed [facility] or 1 of the many clinics listed on his discharge papers for follow-up of his blood pressure.  Patient currently does not take any medication. ____________________________________________   FINAL CLINICAL IMPRESSION(S) / ED DIAGNOSES  Final diagnoses:  Dental abscess     ED Discharge Orders        Ordered    amoxicillin (AMOXIL) 500 MG capsule  3 times daily     11/09/17 0921    ibuprofen (ADVIL,MOTRIN) 600 MG tablet  Every 8 hours PRN     11/09/17 9191       Note:  This document was prepared using Dragon voice recognition software and may include unintentional dictation errors.    Tommi Rumps, PA-C 11/09/17 1235    Tommi Rumps, PA-C 11/09/17 1235    Schaevitz, Myra Rude, MD 11/09/17 1330

## 2017-11-09 NOTE — Discharge Instructions (Signed)
[facility] to have your [facility] rechecked. Begin antibiotics until completely finished and ibuprofen 3 times daily with food. Contact 1 of the dental clinics listed on your discharge papers.  Also [facility] takes walk-in patients.  These clinics are based on your income.  OPTIONS FOR DENTAL FOLLOW UP CARE  [place] [facility] - Local Safety Net Dental Clinics [email or web address]   [facility] [phone])  [facility] [phone])  [place] [phone])  [facility] [phone])  [facility] [phone]) This clinic caters to the indigent population and is on a lottery system. Location: [employer] [facility], [employer], [address], [place] Clinic Hours: Wednesdays from 6pm - 9pm, patients seen by a lottery system. For dates, call or go to [email or web address] Services: Cleanings, fillings and simple extractions. Payment Options: DENTAL WORK IS FREE OF CHARGE. Bring proof of income or support. Best way to get seen: Arrive at 5:15 pm - this is a lottery, NOT first come/first serve, so arriving earlier will not increase your chances of being seen.     [facility] [phone] Select option 1 for emergencies   Location: [facility], [place], [address] [place] Clinic Hours: No walk-ins accepted - call the day before to schedule an appointment. Check in times are 9:30 am and 1:30 pm. Services: Simple extractions, temporary fillings, pulpectomy/pulp debridement, uncomplicated abscess drainage. Payment Options: PAYMENT IS DUE AT THE TIME OF SERVICE.  Fee is usually $100-200, additional surgical procedures (e.g. abscess drainage) may be extra. Cash, checks, Visa/MasterCard accepted.  Can file Medicaid if patient  is covered for dental - patient should call case worker to check. No discount for [facility] patients. Best way to get seen: MUST call the day before and get onto the schedule. Can usually be seen the next 1-2 days. No walk-ins accepted.     [facility] [phone]   Location: [facility], [address] [place] Clinic Hours: M, W, Th, F 8am or 1:30pm, Tues 9a or 1:30 - first come/first served. Services: Simple extractions, temporary fillings, uncomplicated abscess drainage.  You do not need to be an [facility] resident. Payment Options: PAYMENT IS DUE AT THE TIME OF SERVICE. Dental insurance, otherwise sliding scale - bring proof of income or support. Depending on income and treatment needed, cost is usually $50-200. Best way to get seen: Arrive early as it is first come/first served.     [facility] [phone]   Location: [address] Clinic Hours: Mon-Thu 8a-5p Services: Most basic dental services including extractions and fillings. Payment Options: PAYMENT IS DUE AT THE TIME OF SERVICE. Sliding scale, up to 50% off - bring proof if income or support. Medicaid with dental option accepted. Best way to get seen: Call to schedule an appointment, can usually be seen within 2 weeks OR they will try to see walk-ins - show up at 8a or 2p (you may have to wait).     [facility] [phone] [place] RESIDENTS ONLY   Location: [facility], [address], [place], [place] [address] Clinic Hours: By appointment only. Monday - Thursday 8am-5pm, Friday 8am-12pm Services: Cleanings, fillings, extractions. Payment Options: PAYMENT IS DUE AT THE TIME OF SERVICE. Cash, Visa or MasterCard. Sliding scale - $30 minimum per service. Best way to get seen: Come in to  office, complete packet and make an appointment - need proof of income or support  monies for each household member and proof of [place] residence. Usually takes about a month to get in.     [facility] [phone]   Location: [address] [facility] Hours: Walk-in Urgent Care Dental Services are offered Monday-Friday mornings only. The numbers of emergencies accepted daily is limited to the number of providers available. Maximum 15 - Mondays, Wednesdays & Thursdays Maximum 10 - Tuesdays & Fridays Services: You do not need to be a [place] resident to be seen for a dental emergency. Emergencies are defined as pain, swelling, abnormal bleeding, or dental trauma. Walkins will receive x-rays if needed. NOTE: Dental cleaning is not an emergency. Payment Options: PAYMENT IS DUE AT THE TIME OF SERVICE. Minimum co-pay is $40.00 for uninsured patients. Minimum co-pay is $3.00 for Medicaid with dental coverage. Dental Insurance is accepted and must be presented at time of visit. Medicare does not cover dental. Forms of payment: Cash, credit card, checks. Best way to get seen: If not previously registered with the clinic, walk-in dental registration begins at 7:15 am and is on a first come/first serve basis. If previously registered with the clinic, call to make an appointment.     [facility] [phone] [place] RESIDENTS ONLY   Location: [address], [place], [place] Clinic Hours: Mon-Thu 10a-2p Services: Extractions only! Payment Options: FREE (donations accepted) - bring proof of income or support Best way to get seen: Call and schedule an appointment OR come at 8am on the 1st Monday of every month (except for holidays) when it is first come/first served.     [facility] [phone]   Location: [address], [place] Clinic Hours: Friday mornings Services, Payment Options, Best way to get seen: Call for info

## 2017-11-09 NOTE — ED Notes (Signed)
See triage note  Presents with lip swelling since yesterday  Denies any trauma  No diff swallowing or speaking

## 2017-11-09 NOTE — ED Triage Notes (Signed)
Upper lip and left facial swelling.  Onset this morning. Patient states he had broken his front tooth a couple of week ago, but swelling started today.

## 2019-08-06 ENCOUNTER — Encounter
Admission: EM | Disposition: A | Discharge status: Home / Self Care | Payer: Self-pay | Source: Home / Self Care | Attending: Internal Medicine

## 2019-08-06 ENCOUNTER — Other Ambulatory Visit: Payer: Self-pay

## 2019-08-06 ENCOUNTER — Inpatient Hospital Stay
Admission: EM | Admit: 2019-08-06 | Discharge: 2019-08-08 | DRG: 246 | Disposition: A | Discharge status: Home / Self Care | Payer: Self-pay | Attending: Internal Medicine | Admitting: Internal Medicine

## 2019-08-06 ENCOUNTER — Emergency Department: Payer: Self-pay

## 2019-08-06 DIAGNOSIS — F1410 Cocaine abuse, uncomplicated: Secondary | ICD-10-CM | POA: Diagnosis present

## 2019-08-06 DIAGNOSIS — I213 ST elevation (STEMI) myocardial infarction of unspecified site: Secondary | ICD-10-CM | POA: Diagnosis present

## 2019-08-06 DIAGNOSIS — I255 Ischemic cardiomyopathy: Secondary | ICD-10-CM | POA: Diagnosis present

## 2019-08-06 DIAGNOSIS — Z23 Encounter for immunization: Secondary | ICD-10-CM

## 2019-08-06 DIAGNOSIS — F1910 Other psychoactive substance abuse, uncomplicated: Secondary | ICD-10-CM

## 2019-08-06 DIAGNOSIS — F17200 Nicotine dependence, unspecified, uncomplicated: Secondary | ICD-10-CM | POA: Diagnosis present

## 2019-08-06 DIAGNOSIS — I2102 ST elevation (STEMI) myocardial infarction involving left anterior descending coronary artery: Principal | ICD-10-CM | POA: Diagnosis present

## 2019-08-06 DIAGNOSIS — I252 Old myocardial infarction: Secondary | ICD-10-CM

## 2019-08-06 DIAGNOSIS — U071 COVID-19: Secondary | ICD-10-CM | POA: Diagnosis present

## 2019-08-06 DIAGNOSIS — G35 Multiple sclerosis: Secondary | ICD-10-CM | POA: Diagnosis present

## 2019-08-06 DIAGNOSIS — I5021 Acute systolic (congestive) heart failure: Secondary | ICD-10-CM | POA: Diagnosis present

## 2019-08-06 DIAGNOSIS — F1210 Cannabis abuse, uncomplicated: Secondary | ICD-10-CM | POA: Diagnosis present

## 2019-08-06 DIAGNOSIS — I25110 Atherosclerotic heart disease of native coronary artery with unstable angina pectoris: Secondary | ICD-10-CM | POA: Diagnosis present

## 2019-08-06 DIAGNOSIS — N179 Acute kidney failure, unspecified: Secondary | ICD-10-CM | POA: Diagnosis present

## 2019-08-06 HISTORY — PX: LEFT HEART CATH AND CORONARY ANGIOGRAPHY: CATH118249

## 2019-08-06 HISTORY — PX: CORONARY/GRAFT ACUTE MI REVASCULARIZATION: CATH118305

## 2019-08-06 HISTORY — DX: ST elevation (STEMI) myocardial infarction involving left anterior descending coronary artery: I21.02

## 2019-08-06 HISTORY — DX: ST elevation (STEMI) myocardial infarction of unspecified site: I21.3

## 2019-08-06 LAB — BASIC METABOLIC PANEL
Anion gap: 15 (ref 5–15)
BUN: 22 mg/dL — ABNORMAL HIGH (ref 6–20)
CO2: 20 mmol/L — ABNORMAL LOW (ref 22–32)
Calcium: 9.7 mg/dL (ref 8.9–10.3)
Chloride: 105 mmol/L (ref 98–111)
Creatinine, Ser: 1.67 mg/dL — ABNORMAL HIGH (ref 0.61–1.24)
GFR calc Af Amer: 56 mL/min — ABNORMAL LOW (ref >60)
GFR calc non Af Amer: 49 mL/min — ABNORMAL LOW (ref >60)
Glucose, Bld: 198 mg/dL — ABNORMAL HIGH (ref 70–99)
Potassium: 3.6 mmol/L (ref 3.5–5.1)
Sodium: 140 mmol/L (ref 135–145)

## 2019-08-06 LAB — CBC
HCT: 40.3 % (ref 39.0–52.0)
Hemoglobin: 13.6 g/dL (ref 13.0–17.0)
MCH: 28.3 pg (ref 26.0–34.0)
MCHC: 33.7 g/dL (ref 30.0–36.0)
MCV: 83.8 fL (ref 80.0–100.0)
Platelets: 352 10*3/uL (ref 150–400)
RBC: 4.81 MIL/uL (ref 4.22–5.81)
RDW: 12.5 % (ref 11.5–15.5)
WBC: 13.9 10*3/uL — ABNORMAL HIGH (ref 4.0–10.5)
nRBC: 0 % (ref 0.0–0.2)

## 2019-08-06 LAB — TROPONIN I (HIGH SENSITIVITY): Troponin I (High Sensitivity): 5 ng/L (ref <18)

## 2019-08-06 LAB — POCT ACTIVATED CLOTTING TIME
Activated Clotting Time: 312 seconds
Activated Clotting Time: 477 seconds

## 2019-08-06 LAB — SARS CORONAVIRUS 2 BY RT PCR (HOSPITAL ORDER, PERFORMED IN ~~LOC~~ HOSPITAL LAB): SARS Coronavirus 2: POSITIVE — AB

## 2019-08-06 LAB — ETHANOL: Alcohol, Ethyl (B): <10 mg/dL (ref <10)

## 2019-08-06 SURGERY — CORONARY/GRAFT ACUTE MI REVASCULARIZATION
Anesthesia: Moderate Sedation

## 2019-08-06 MED ORDER — SODIUM CHLORIDE 0.9% FLUSH: 3.0000 mL | Freq: Two times a day (BID) | INTRAVENOUS | Status: DC

## 2019-08-06 MED ORDER — NITROGLYCERIN 0.4 MG SL SUBL
0.4000 mg | SUBLINGUAL_TABLET | SUBLINGUAL | Status: DC | PRN
  Administered 2019-08-06: 0.4 mg via SUBLINGUAL

## 2019-08-06 MED ORDER — ONDANSETRON HCL 4 MG/2ML IJ SOLN
4.0000 mg | Freq: Once | INTRAMUSCULAR | Status: DC
  Filled 2019-08-06: qty 2

## 2019-08-06 MED ORDER — IOHEXOL 300 MG/ML  SOLN
INTRAMUSCULAR | Status: DC | PRN
  Administered 2019-08-06: 23:00:00 118 mL

## 2019-08-06 MED ORDER — HEPARIN SODIUM (PORCINE) 1000 UNIT/ML IJ SOLN
INTRAMUSCULAR | Status: DC | PRN
  Administered 2019-08-06: 5000 [IU] via INTRAVENOUS

## 2019-08-06 MED ORDER — ONDANSETRON HCL 4 MG/2ML IJ SOLN: 4.0000 mg | Freq: Four times a day (QID) | INTRAMUSCULAR | Status: DC | PRN

## 2019-08-06 MED ORDER — ONDANSETRON HCL 4 MG/2ML IJ SOLN
4.0000 mg | Freq: Four times a day (QID) | INTRAMUSCULAR | Status: DC | PRN
  Administered 2019-08-07: 4 mg via INTRAVENOUS

## 2019-08-06 MED ORDER — NOREPINEPHRINE 4 MG/250ML-% IV SOLN: 0.0000 ug/min | INTRAVENOUS | Status: DC

## 2019-08-06 MED ORDER — ACETAMINOPHEN 325 MG PO TABS: 650.0000 mg | ORAL_TABLET | ORAL | Status: DC | PRN

## 2019-08-06 MED ORDER — SODIUM CHLORIDE 0.9 % IV SOLN: 250.0000 mL | INTRAVENOUS | Status: DC | PRN

## 2019-08-06 MED ORDER — VERAPAMIL HCL 2.5 MG/ML IV SOLN
INTRAVENOUS | Status: DC | PRN
  Administered 2019-08-06: 22:00:00 via INTRA_ARTERIAL

## 2019-08-06 MED ORDER — ASPIRIN 81 MG PO CHEW: 324.0000 mg | CHEWABLE_TABLET | ORAL | Status: DC

## 2019-08-06 MED ORDER — HYDRALAZINE HCL 20 MG/ML IJ SOLN: 10.0000 mg | INTRAMUSCULAR | Status: DC | PRN

## 2019-08-06 MED ORDER — ASPIRIN 81 MG PO CHEW
81.0000 mg | CHEWABLE_TABLET | Freq: Every day | ORAL | Status: DC
  Administered 2019-08-07 – 2019-08-08 (×2): 81 mg via ORAL
  Filled 2019-08-06 (×2): qty 1

## 2019-08-06 MED ORDER — MORPHINE SULFATE (PF) 4 MG/ML IV SOLN
4.0000 mg | Freq: Once | INTRAVENOUS | Status: AC
  Administered 2019-08-06: 22:00:00 4 mg via INTRAVENOUS

## 2019-08-06 MED ORDER — ONDANSETRON HCL 4 MG/2ML IJ SOLN
INTRAMUSCULAR | Status: AC
  Filled 2019-08-06: qty 2

## 2019-08-06 MED ORDER — ASPIRIN 81 MG PO CHEW
324.0000 mg | CHEWABLE_TABLET | Freq: Once | ORAL | Status: AC
  Administered 2019-08-06: 324 mg via ORAL

## 2019-08-06 MED ORDER — SODIUM CHLORIDE 0.9% FLUSH: 3.0000 mL | INTRAVENOUS | Status: DC | PRN

## 2019-08-06 MED ORDER — SODIUM CHLORIDE 0.9 % WEIGHT BASED INFUSION
1.0000 mL/kg/h | INTRAVENOUS | Status: DC
  Administered 2019-08-06 – 2019-08-07 (×2): 1 mL/kg/h via INTRAVENOUS

## 2019-08-06 MED ORDER — TICAGRELOR 90 MG PO TABS
ORAL_TABLET | ORAL | Status: AC
  Filled 2019-08-06: qty 2

## 2019-08-06 MED ORDER — TICAGRELOR 90 MG PO TABS
90.0000 mg | ORAL_TABLET | Freq: Two times a day (BID) | ORAL | Status: DC
  Administered 2019-08-07 – 2019-08-08 (×3): 90 mg via ORAL
  Filled 2019-08-06 (×3): qty 1

## 2019-08-06 MED ORDER — ACETAMINOPHEN 325 MG PO TABS
650.0000 mg | ORAL_TABLET | ORAL | Status: DC | PRN
  Administered 2019-08-07 – 2019-08-08 (×2): 650 mg via ORAL
  Filled 2019-08-06 (×2): qty 2

## 2019-08-06 MED ORDER — TICAGRELOR 90 MG PO TABS
ORAL_TABLET | ORAL | Status: DC | PRN
  Administered 2019-08-06: 180 mg via ORAL

## 2019-08-06 MED ORDER — VERAPAMIL HCL 2.5 MG/ML IV SOLN
INTRAVENOUS | Status: AC
  Filled 2019-08-06: qty 2

## 2019-08-06 MED ORDER — ASPIRIN EC 81 MG PO TBEC: 81.0000 mg | DELAYED_RELEASE_TABLET | Freq: Every day | ORAL | Status: DC

## 2019-08-06 MED ORDER — ATORVASTATIN CALCIUM 20 MG PO TABS
80.0000 mg | ORAL_TABLET | Freq: Every day | ORAL | Status: DC
  Administered 2019-08-07: 80 mg via ORAL
  Filled 2019-08-06: qty 4

## 2019-08-06 MED ORDER — ATORVASTATIN CALCIUM 20 MG PO TABS: 20.0000 mg | ORAL_TABLET | Freq: Every day | ORAL | Status: DC

## 2019-08-06 MED ORDER — NITROGLYCERIN 1 MG/10 ML FOR IR/CATH LAB
INTRA_ARTERIAL | Status: AC
  Filled 2019-08-06: qty 10

## 2019-08-06 MED ORDER — MORPHINE SULFATE (PF) 4 MG/ML IV SOLN
INTRAVENOUS | Status: AC
  Administered 2019-08-06: 22:00:00 4 mg via INTRAVENOUS
  Filled 2019-08-06: qty 1

## 2019-08-06 MED ORDER — SODIUM CHLORIDE 0.9% FLUSH
3.0000 mL | Freq: Once | INTRAVENOUS | Status: AC
  Administered 2019-08-06: 3 mL via INTRAVENOUS

## 2019-08-06 MED ORDER — ASPIRIN 300 MG RE SUPP
300.0000 mg | RECTAL | Status: DC
  Filled 2019-08-06: qty 1

## 2019-08-06 MED ORDER — LABETALOL HCL 5 MG/ML IV SOLN: 10.0000 mg | INTRAVENOUS | Status: DC | PRN

## 2019-08-06 MED ORDER — SODIUM CHLORIDE 0.9 % IV BOLUS
1000.0000 mL | Freq: Once | INTRAVENOUS | Status: AC
  Administered 2019-08-06: 1000 mL via INTRAVENOUS

## 2019-08-06 MED ORDER — SODIUM CHLORIDE 0.9 % IV SOLN: INTRAVENOUS | Status: DC

## 2019-08-06 MED ORDER — CHLORHEXIDINE GLUCONATE CLOTH 2 % EX PADS
6.0000 | MEDICATED_PAD | Freq: Every day | CUTANEOUS | Status: DC
  Administered 2019-08-07 – 2019-08-08 (×2): 6 via TOPICAL

## 2019-08-06 MED ORDER — NITROGLYCERIN 0.4 MG SL SUBL
0.4000 mg | SUBLINGUAL_TABLET | SUBLINGUAL | Status: AC | PRN
  Administered 2019-08-07 (×3): 0.4 mg via SUBLINGUAL
  Filled 2019-08-06: qty 1

## 2019-08-06 MED ORDER — HEPARIN SODIUM (PORCINE) 10000 UNIT/ML IJ SOLN
7680.0000 [IU] | Freq: Once | INTRAMUSCULAR | Status: AC
  Administered 2019-08-06: 7680 [IU] via INTRAVENOUS
  Filled 2019-08-06: qty 1

## 2019-08-06 MED ORDER — HEPARIN (PORCINE) IN NACL 1000-0.9 UT/500ML-% IV SOLN
INTRAVENOUS | Status: DC | PRN
  Administered 2019-08-06: 1000 mL

## 2019-08-06 MED ORDER — HEPARIN SODIUM (PORCINE) 1000 UNIT/ML IJ SOLN
INTRAMUSCULAR | Status: AC
  Filled 2019-08-06: qty 1

## 2019-08-06 SURGICAL SUPPLY — 17 items
BALLN EUPHORA RX 3.0X20 (BALLOONS) ×3
BALLN ~~LOC~~ EUPHORA RX 5.0X12 (BALLOONS) ×3
BALLOON EUPHORA RX 3.0X20 (BALLOONS) ×1 IMPLANT
BALLOON ~~LOC~~ EUPHORA RX 5.0X12 (BALLOONS) ×1 IMPLANT
CATH INFINITI 5FR ANG PIGTAIL (CATHETERS) ×3 IMPLANT
CATH INFINITI JR4 5F (CATHETERS) ×3 IMPLANT
CATH VISTA GUIDE 6FR JL3.5 (CATHETERS) ×3 IMPLANT
DEVICE INFLAT 30 PLUS (MISCELLANEOUS) ×3 IMPLANT
DEVICE RAD COMP TR BAND LRG (VASCULAR PRODUCTS) ×3 IMPLANT
GLIDESHEATH SLEND SS 6F .021 (SHEATH) ×3 IMPLANT
KIT MANI 3VAL PERCEP (MISCELLANEOUS) ×3 IMPLANT
PACK CARDIAC CATH (CUSTOM PROCEDURE TRAY) ×3 IMPLANT
PROTECTION STATION PRESSURIZED (MISCELLANEOUS) ×3
STATION PROTECTION PRESSURIZED (MISCELLANEOUS) ×1 IMPLANT
STENT RESOLUTE ONYX 4.5X22 (Permanent Stent) ×3 IMPLANT
WIRE ASAHI PROWATER 180CM (WIRE) ×3 IMPLANT
WIRE ROSEN-J .035X260CM (WIRE) ×3 IMPLANT

## 2019-08-06 NOTE — Consult Note (Signed)
[facility] Cardiology  CARDIOLOGY CONSULT NOTE  Patient ID: Ricardo Yang MRN: [id number] DOB/AGE: 1976-01-25 43 y.o.  Admit date: 08/06/2019 Referring Physician [facility] Primary Physician  Primary Cardiologist  Reason for Consultation anterior ST elevation myocardial infarction  HPI: 43 year old gentleman with history of polysubstance abuse, presents to [facility] with chest pain and anterior ST elevation myocardial infarction.  Patient was driving home, and approximately 45 minutes upon arrival he started to experience substernal chest pain.  EMS was called, patient was brought to [facility] emergency room.  ECG revealed anterior ST elevation in leads V2, 3, 4, 5 and 1 and aVL consistent with acute anterior and anterior lateral ST elevation myocardial infarction.  The patient has a history of polysubstance abuse including tobacco, alcohol, cocaine, and crack.  Review of systems complete and found to be negative unless listed above     Past Medical History:  Diagnosis Date  . Multiple sclerosis (HCC)    a. question possible MS  . Obesity   . Polysubstance abuse (HCC)    a. tobacco, cocaine, crack, ecstasy, pills, "anything but injections"    Past Surgical History:  Procedure Laterality Date  . CARDIAC CATHETERIZATION N/A 05/28/2015   Procedure: Left Heart Cath and Coronary Angiography;  Surgeon: Antonieta Iba, MD;  Location: [facility] INVASIVE CV LAB;  Service: Cardiovascular;  Laterality: N/A;    Medications Prior to Admission  Medication Sig Dispense Refill Last Dose  . amoxicillin (AMOXIL) 500 MG capsule Take 1 capsule (500 mg total) by mouth 3 (three) times daily. 30 capsule 0   . ibuprofen (ADVIL,MOTRIN) 600 MG tablet Take 1 tablet (600 mg total) by mouth every 8 (eight) hours as needed. 30 tablet 0    Social History   Socioeconomic History  . Marital status: Single    Spouse name: Not on file  . Number of children: Not on file  . Years of education: Not on file  . Highest education level: Not  on file  Occupational History  . Not on file  Social Needs  . Financial resource strain: Not on file  . Food insecurity    Worry: Not on file    Inability: Not on file  . Transportation needs    Medical: Not on file    Non-medical: Not on file  Tobacco Use  . Smoking status: Current Every Day Smoker  . Smokeless tobacco: Never Used  Substance and Sexual Activity  . Alcohol use: No    Alcohol/week: 0.0 standard drinks  . Drug use: Yes    Frequency: 1.0 times per week    Types: "Crack" cocaine, Amphetamines, Cocaine, Hashish, Marijuana, MDMA (Ecstacy)  . Sexual activity: Yes    Partners: Female  Lifestyle  . Physical activity    Days per week: Not on file    Minutes per session: Not on file  . Stress: Not on file  Relationships  . Social Musician on phone: Not on file    Gets together: Not on file    Attends religious service: Not on file    Active member of club or organization: Not on file    Attends meetings of clubs or organizations: Not on file    Relationship status: Not on file  . Intimate partner violence    Fear of current or ex partner: Not on file    Emotionally abused: Not on file    Physically abused: Not on file    Forced sexual activity: Not on file  Other  Topics Concern  . Not on file  Social History Narrative  . Not on file    Family History  Problem Relation Age of Onset  . Heart disease Father       Review of systems complete and found to be negative unless listed above      PHYSICAL EXAM  General: Well developed, well nourished, in no acute distress HEENT:  Normocephalic and atramatic Neck:  No JVD.  Lungs: Clear bilaterally to auscultation and percussion. Heart: HRRR . Normal S1 and S2 without gallops or murmurs.  Abdomen: Bowel sounds are positive, abdomen soft and non-tender  Msk:  Back normal, normal gait. Normal strength and tone for age. Extremities: No clubbing, cyanosis or edema.   Neuro: Alert and oriented X  3. Psych:  Good affect, responds appropriately  Labs:   Lab Results  Component Value Date   WBC 13.9 (H) 08/06/2019   HGB 13.6 08/06/2019   HCT 40.3 08/06/2019   MCV 83.8 08/06/2019   PLT 352 08/06/2019    Recent Labs  Lab 08/06/19 2133  NA 140  K 3.6  CL 105  CO2 20*  BUN 22*  CREATININE 1.67*  CALCIUM 9.7  GLUCOSE 198*   Lab Results  Component Value Date   TROPONINI 2.09 (H) 05/27/2015    Lab Results  Component Value Date   CHOL 138 05/26/2015   Lab Results  Component Value Date   HDL 31 (L) 05/26/2015   Lab Results  Component Value Date   LDLCALC 85 05/26/2015   Lab Results  Component Value Date   TRIG 109 05/26/2015   Lab Results  Component Value Date   CHOLHDL 4.5 05/26/2015   No results found for: LDLDIRECT    Radiology: Dg Chest Portable 1 View  Result Date: 08/06/2019 CLINICAL DATA:  Chest pain. EXAM: PORTABLE CHEST 1 VIEW COMPARISON:  Chest x-ray dated May 25, 2015. FINDINGS: The heart size and mediastinal contours are within normal limits. Both lungs are clear. The visualized skeletal structures are unremarkable. IMPRESSION: No active disease. Electronically Signed   By: Titus Dubin M.D.   On: 08/06/2019 22:01    EKG: Sinus rhythm with ST elevation in leads V2, 3, 4, 5, 1 and aVL  ASSESSMENT AND PLAN:   1.  Acute anterior lateral ST elevation myocardial infarction  Recommendations  1.  Emergent cardiac catheterization and probable primary PCI  Signed: Isaias Cowman MD,PhD, [facility] 08/06/2019, 11:29 PM

## 2019-08-06 NOTE — Progress Notes (Signed)
   08/06/19 2100  Clinical Encounter Type  Visited With Health care provider  Visit Type Initial;Code  Referral From Nurse  Consult/Referral To Chaplain  Spiritual Encounters  Spiritual Needs Other (Comment)  Chaplain was page for code stemi and arrived at patient's room made her pastoral presence known. Care team was working with patient . Patient has had a heart attack. Chaplain went with charge nurse to speak with patient's father. Charge nurse explained what has happened and the patient would be going to the Cath. Father understood procedure and stated he would call patient's mother because he had to leave to go take care of his wife. Chaplain told charge nurse to call if Mother returned.

## 2019-08-06 NOTE — ED Triage Notes (Signed)
PT to ED Code STEMI. Sudden onset of centralized chest pain 45 minutes ago. Hx of 1 MI without stent placement. Pt reports he has had some alcohol and marijuana. No other drug use.   Nausea. No cough or fevers.

## 2019-08-06 NOTE — H&P (Signed)
[facility] NAME: Ricardo Yang    MR#:  [id number]  DATE OF BIRTH:  05/06/1976  DATE OF ADMISSION:  08/06/2019  PRIMARY CARE PHYSICIAN: Patient, No Pcp Per   REQUESTING/REFERRING PHYSICIAN: Blake Divine, MD  CHIEF COMPLAINT:   Chief Complaint  Patient presents with  . Code STEMI    HISTORY OF PRESENT ILLNESS:  43 y.o. male with pertinent past medical history pertinent for polysubstance abuse, and myocardial infarction without stent placement presenting to the ED with acute onset chest pain.  Patient report onset of symptoms approximately 45 minutes prior to arrival to the ED.  Describes symptoms of sudden onset of central chest pain that felt like a squeezing sensation with associated nausea without vomiting, and lightheadedness.  Denies radiation to arm or back, diaphoresis, abdominal pain or vomiting.  Patient states that he had just been released from jail about 2 days ago and went out to drink with his friends to celebrate his release.  He admits to smoking marijuana but denied cocaine use.  On EMS arrival, patient had ECG that revealed anterior ST elevation in leads V2, 3, 4, 5 and 1 and aVL consistent with acute anterior and anterior lateral STEMI.  Code STEMI called on route.  On arrival to the ED, code STEMI was called and EKG obtained again consistent with STEMI.  Given ongoing chest pain in the ED patient received single dose of nitroglycerin with drop in blood pressure.  He was given morphine IV for additional chest pain as well plus aspirin load.  Initial chest x-ray negative for acute process therefore get patient was given heparin bolus.  On-call cardiologist Dr. Saralyn Pilar was notified and patient was taken to the Cath Lab for emergent heart catheterization.  Initial labs drawn in the ED revealed glucose 198, BUN 22, creatinine 1.67, WBC 13.9, troponin 5 repeat troponin >27,0000.  COVID-19 positive. UDS positive for  cannabinoid.  Hospitalist asked to admit to ICU for further monitoring.  PAST MEDICAL HISTORY:   Past Medical History:  Diagnosis Date  . Multiple sclerosis ([place])    a. question possible MS  . Obesity   . Polysubstance abuse ([place])    a. tobacco, cocaine, crack, ecstasy, pills, "anything but injections"    PAST SURGICAL HISTORY:   Past Surgical History:  Procedure Laterality Date  . CARDIAC CATHETERIZATION N/A 05/28/2015   Procedure: Left Heart Cath and Coronary Angiography;  Surgeon: Minna Merritts, MD;  Location: [place] CV LAB;  Service: Cardiovascular;  Laterality: N/A;    SOCIAL HISTORY:   Social History   Tobacco Use  . Smoking status: Current Every Day Smoker  . Smokeless tobacco: Never Used  Substance Use Topics  . Alcohol use: No    Alcohol/week: 0.0 standard drinks    FAMILY HISTORY:   Family History  Problem Relation Age of Onset  . Heart disease Father     DRUG ALLERGIES:  No Known Allergies  REVIEW OF SYSTEMS:   Review of Systems  Constitutional: Negative for chills, fever, malaise/fatigue and weight loss.  HENT: Negative for congestion, hearing loss and sore throat.   Eyes: Negative for blurred vision and double vision.  Respiratory: Negative for cough, shortness of breath and wheezing.   Cardiovascular: Positive for chest pain. Negative for palpitations, orthopnea and leg swelling.  Gastrointestinal: Positive for nausea. Negative for abdominal pain, diarrhea and vomiting.  Genitourinary: Negative for dysuria and urgency.  Musculoskeletal: Negative for myalgias.  Skin:  Negative for rash.  Neurological: Positive for dizziness. Negative for sensory change, speech change, focal weakness and headaches.  Psychiatric/Behavioral: Positive for substance abuse. Negative for depression.   MEDICATIONS AT HOME:   Prior to Admission medications   Medication Sig Start Date End Date Taking? Authorizing Provider  amoxicillin (AMOXIL) 500 MG capsule  Take 1 capsule (500 mg total) by mouth 3 (three) times daily. 11/09/17   Tommi Rumps, PA-C  ibuprofen (ADVIL,MOTRIN) 600 MG tablet Take 1 tablet (600 mg total) by mouth every 8 (eight) hours as needed. 11/09/17   Tommi Rumps, PA-C      VITAL SIGNS:  Blood pressure 126/68, pulse 79, resp. rate (!) 33, weight 128.4 kg, SpO2 (!) 23 %.  PHYSICAL EXAMINATION:   Physical Exam  GENERAL:  43 y.o.-year-old patient lying in the bed with no acute distress.  EYES: Pupils equal, round, reactive to light and accommodation. No scleral icterus. Extraocular muscles intact.  HEENT: Head atraumatic, normocephalic. Oropharynx and nasopharynx clear.  NECK:  Supple, no jugular venous distention. No thyroid enlargement, no tenderness.  LUNGS: Normal breath sounds bilaterally, no wheezing, rales,rhonchi or crepitation. No use of accessory muscles of respiration.  CARDIOVASCULAR: S1, S2 normal. No murmurs, rubs, or gallops.  ABDOMEN: Soft, nontender, nondistended. Bowel sounds present. No organomegaly or mass.  EXTREMITIES: No pedal edema, cyanosis, or clubbing.  NEUROLOGIC: Cranial nerves II through XII are intact. Muscle strength 5/5 in all extremities. Sensation intact. Gait not checked.  PSYCHIATRIC: The patient is alert and oriented x 3.  SKIN: No obvious rash, lesion, or ulcer.   DATA REVIEWED:  LABORATORY PANEL:   CBC Recent Labs  Lab 08/06/19 2133  WBC 13.9*  HGB 13.6  HCT 40.3  PLT 352   ------------------------------------------------------------------------------------------------------------------  Chemistries  Recent Labs  Lab 08/06/19 2133  NA 140  K 3.6  CL 105  CO2 20*  GLUCOSE 198*  BUN 22*  CREATININE 1.67*  CALCIUM 9.7   ------------------------------------------------------------------------------------------------------------------  Cardiac Enzymes No results for input(s): TROPONINI in the last 168 hours.  ------------------------------------------------------------------------------------------------------------------  RADIOLOGY:  Dg Chest Portable 1 View  Result Date: 08/06/2019 CLINICAL DATA:  Chest pain. EXAM: PORTABLE CHEST 1 VIEW COMPARISON:  Chest x-ray dated May 25, 2015. FINDINGS: The heart size and mediastinal contours are within normal limits. Both lungs are clear. The visualized skeletal structures are unremarkable. IMPRESSION: No active disease. Electronically Signed   By: Obie Dredge M.D.   On: 08/06/2019 22:01    EKG:  EKG: Sinus rhythm with ST elevation in leads V2, 3, 4, 5, 1 and aVL  IMPRESSION AND PLAN:   43 y.o. male with pertinent past medical history pertinent for polysubstance abuse, and myocardial infarction without stent placement presenting to the ED with acute onset chest pain.  1. STEMI / Unstable Angina -patient presenting with chest pain noted with ST elevation in leads V2, 3, 4, 5, 1 and aVL with abnormal cardiac enzyme. s/p catheterization with PCI with DES proximal LAD - Admit to ICU - Continue ASA 81mg  PO daily + Ticagrelor - Start high-dose statin Atorvastatin 80mg  PO daily, check lipid panel - Holding Beta-blocker for low BP - NTG + morphine PRN chest pain  - Check echocardiogram - Trend troponin - Cardiology input appreciated  2.  Acute kidney injury- likely cardiorenal syndrome - Avoid nephrotoxins - IV Fluids hydration - Continue to monitor renal function  3. Acute COVID-19 illness - Leuckocytosis without respiratory symptoms - Trend troponin levels and add CK.  - Follow  CBC and CMP.   - We will place him on vitamin D, vitamin C, zinc sulfate and Pepcid.   - Check inflammatory markers including CRP, ferritin and LDH - Check Echo as above - ID consult  4. Polysubstance abuse - Counseling provided  5. DVT prophylaxis - Lovenox  All the records are reviewed and case discussed with ED provider. Management plans discussed with the  patient, family and they are in agreement.  CODE STATUS: FULL  TOTAL TIME TAKING CARE OF THIS PATIENT: 50 minutes.    on 08/06/2019 at 10:19 PM   Webb SilversmithElizabeth Talha Iser, DNP, FNP-BC [facility] Nurse Practitioner Between 7am to 6pm - Pager [phone]- [phone]  After 6pm go to [email or web address] - password [email or web address]  [facility] [place] [facility]  Office  [phone]  CC: Primary care physician; Patient, No Pcp Per

## 2019-08-06 NOTE — ED Provider Notes (Addendum)
[facility] Emergency Department Provider Note   ____________________________________________   First MD Initiated Contact with Patient 08/06/19 2142     (approximate)  I have reviewed the triage vital signs and the nursing notes.   HISTORY  Chief Complaint Code STEMI    HPI Ricardo Yang is a 43 y.o. male with past medical history of polysubstance abuse presents to the ED complaining of chest pain.  Patient reports he was driving home approximately 45 minutes prior to arrival when he had sudden onset of central chest squeezing.  He describes the pain as constant and severe, EMS was called and performed EKG concerning for STEMI.  Patient complains of some shortness of breath following onset of chest pain, but states he was feeling well earlier in the day with no fevers, chills, or cough.  He does admit to drinking alcohol this evening and smoking marijuana, but adamantly denies any cocaine abuse.  Father does report that he recently got out of jail.  Per chart, patient had one prior NSTEMI in 2016 that was attributed to cocaine abuse.        Past Medical History:  Diagnosis Date   Multiple sclerosis ([place])    a. question possible MS   Obesity    Polysubstance abuse ([place])    a. tobacco, cocaine, crack, ecstasy, pills, "anything but injections"    Patient Active Problem List   Diagnosis Date Noted   Chest pain    NSTEMI (non-ST elevated myocardial infarction) ([place]) 05/25/2015   Elevated troponin    Ischemic chest pain ([place])    Cocaine abuse ([place])    Heavy smoker     Past Surgical History:  Procedure Laterality Date   CARDIAC CATHETERIZATION N/A 05/28/2015   Procedure: Left Heart Cath and Coronary Angiography;  Surgeon: Minna Merritts, MD;  Location: [place] CV LAB;  Service: Cardiovascular;  Laterality: N/A;    Prior to Admission medications   Medication Sig Start Date End Date Taking? Authorizing Provider  amoxicillin (AMOXIL)  500 MG capsule Take 1 capsule (500 mg total) by mouth 3 (three) times daily. 11/09/17   Johnn Hai, PA-C  ibuprofen (ADVIL,MOTRIN) 600 MG tablet Take 1 tablet (600 mg total) by mouth every 8 (eight) hours as needed. 11/09/17   Johnn Hai, PA-C    Allergies Patient has no known allergies.  Family History  Problem Relation Age of Onset   Heart disease Father     Social History Social History   Tobacco Use   Smoking status: Current Every Day Smoker   Smokeless tobacco: Never Used  Substance Use Topics   Alcohol use: No    Alcohol/week: 0.0 standard drinks   Drug use: Yes    Frequency: 1.0 times per week    Types: "Crack" cocaine, Amphetamines, Cocaine, Hashish, Marijuana, MDMA (Ecstacy)    Review of Systems  Constitutional: No fever/chills Eyes: No visual changes. ENT: No sore throat. Cardiovascular: Positive for chest pain. Respiratory: Positive for shortness of breath. Gastrointestinal: No abdominal pain.  No nausea, no vomiting.  No diarrhea.  No constipation. Genitourinary: Negative for dysuria. Musculoskeletal: Negative for back pain. Skin: Negative for rash. Neurological: Negative for headaches, focal weakness or numbness.  ____________________________________________   PHYSICAL EXAM:  VITAL SIGNS: ED Triage Vitals  Enc Vitals Group     BP 08/06/19 2132 126/68     Pulse Rate 08/06/19 2132 79     Resp 08/06/19 2132 (!) 33     Temp --  Temp src --      SpO2 08/06/19 2132 (!) 23 %     Weight 08/06/19 2139 283 lb (128.4 kg)     Height --      Head Circumference --      Peak Flow --      Pain Score --      Pain Loc --      Pain Edu? --      Excl. in GC? --     Constitutional: Somnolent but arousable, oriented. Eyes: Conjunctivae are normal. Head: Atraumatic. Nose: No congestion/rhinnorhea. Mouth/Throat: Mucous membranes are moist. Neck: Normal ROM Cardiovascular: Normal rate, regular rhythm. Grossly normal heart  sounds. Respiratory: Normal respiratory effort.  No retractions. Lungs CTAB. Gastrointestinal: Soft and nontender. No distention. Genitourinary: deferred Musculoskeletal: No lower extremity tenderness nor edema. Neurologic:  Normal speech and language. No gross focal neurologic deficits are appreciated. Skin:  Skin is warm, dry and intact. No rash noted. Psychiatric: Mood and affect are normal. Speech and behavior are normal.  ____________________________________________   LABS (all labs ordered are listed, but only abnormal results are displayed)  Labs Reviewed  CBC - Abnormal; Notable for the following components:      Result Value   WBC 13.9 (*)    All other components within normal limits  SARS CORONAVIRUS 2 BY RT PCR (HOSPITAL ORDER, PERFORMED IN [facility])  ETHANOL  BASIC METABOLIC PANEL  URINE DRUG SCREEN, QUALITATIVE ([facility] ONLY)  TROPONIN I (HIGH SENSITIVITY)   ____________________________________________  EKG  ED ECG REPORT I, Chesley Noonharles Marquest Gunkel, the attending physician, personally viewed and interpreted this ECG.   Date: 08/06/2019  EKG Time: 21:13  Rate: 74  Rhythm: normal sinus rhythm  Axis: Normal  Intervals:none  ST&T Change: ST elevation anterolaterally with reciprocal ST depressions inferiorly    PROCEDURES  Procedure(s) performed (including Critical Care):  .Critical Care Performed by: Chesley NoonJessup, Jacaden Forbush, MD Authorized by: Chesley NoonJessup, Aaima Gaddie, MD   Critical care provider statement:    Critical care time (minutes):  35   Critical care time was exclusive of:  Separately billable procedures and treating other patients and teaching time   Critical care was necessary to treat or prevent imminent or life-threatening deterioration of the following conditions:  Circulatory failure   Critical care was time spent personally by me on the following activities:  Discussions with consultants, evaluation of patient's response to treatment, examination of  patient, ordering and performing treatments and interventions, ordering and review of laboratory studies, ordering and review of radiographic studies, pulse oximetry, re-evaluation of patient's condition, obtaining history from patient or surrogate and review of old charts   I assumed direction of critical care for this patient from another provider in my specialty: no       ____________________________________________   INITIAL IMPRESSION / ASSESSMENT AND PLAN / ED COURSE       43 year old male with past medical history of polysubstance abuse and end STEMI attributed to cocaine abuse presents to the ED complaining of 45 minutes of constant squeezing chest pain, onset while driving.  Prehospital EKG consistent with STEMI given anterolateral ST elevations with inferior reciprocal changes.  Code STEMI was called and EKG obtained on patient's arrival was again consistent with STEMI.  He continued to have ongoing chest pain and was normotensive, given single dose of nitroglycerin with drop in blood pressure.  He was given morphine for additional chest pain as well as aspirin load.  Chest x-ray negative for acute process and patient given  heparin bolus.  Dr. Darrold Junker at bedside and plan is for emergent heart catheterization.  Patient reporting improvement of chest pain at this time, however he is persistently hypertensive, will start on Levophed.  Patient subsequently transferred to Cath Lab.      ____________________________________________   FINAL CLINICAL IMPRESSION(S) / ED DIAGNOSES  Final diagnoses:  ST elevation myocardial infarction (STEMI), unspecified artery (HCC)  Polysubstance abuse [facility])     ED Discharge Orders    None       Note:  This document was prepared using Dragon voice recognition software and may include unintentional dictation errors.   Chesley Noon, MD 08/06/19 2330    Chesley Noon, MD 08/17/19 1248

## 2019-08-06 NOTE — Progress Notes (Signed)
eLink Physician-Brief Progress Note Patient Name: Mali L Crispo DOB: September 06, 1976 MRN: [id number]   Date of Service  08/06/2019  HPI/Events of Note  43 year old man with substance abuse, now with STEMI  eICU Interventions  S/p cardiac cath and stable on camera Bedside NP notified and admission being done Please call if needed        Margaretmary Lombard 08/06/2019, 11:59 PM

## 2019-08-07 ENCOUNTER — Inpatient Hospital Stay: Admit: 2019-08-07 | Payer: Self-pay

## 2019-08-07 ENCOUNTER — Inpatient Hospital Stay
Admit: 2019-08-07 | Discharge: 2019-08-07 | Disposition: A | Discharge status: Home / Self Care | Payer: Self-pay | Attending: Nurse Practitioner | Admitting: Nurse Practitioner

## 2019-08-07 DIAGNOSIS — I2102 ST elevation (STEMI) myocardial infarction involving left anterior descending coronary artery: Principal | ICD-10-CM

## 2019-08-07 DIAGNOSIS — I2101 ST elevation (STEMI) myocardial infarction involving left main coronary artery: Secondary | ICD-10-CM

## 2019-08-07 LAB — BASIC METABOLIC PANEL
Anion gap: 9 (ref 5–15)
Anion gap: 8 (ref 5–15)
BUN: 21 mg/dL — ABNORMAL HIGH (ref 6–20)
BUN: 21 mg/dL — ABNORMAL HIGH (ref 6–20)
CO2: 27 mmol/L (ref 22–32)
CO2: 26 mmol/L (ref 22–32)
Calcium: 9.3 mg/dL (ref 8.9–10.3)
Calcium: 8.8 mg/dL — ABNORMAL LOW (ref 8.9–10.3)
Chloride: 104 mmol/L (ref 98–111)
Chloride: 104 mmol/L (ref 98–111)
Creatinine, Ser: 1.29 mg/dL — ABNORMAL HIGH (ref 0.61–1.24)
Creatinine, Ser: 1.07 mg/dL (ref 0.61–1.24)
GFR calc Af Amer: >60 mL/min (ref >60)
GFR calc Af Amer: >60 mL/min (ref >60)
GFR calc non Af Amer: >60 mL/min (ref >60)
GFR calc non Af Amer: >60 mL/min (ref >60)
Glucose, Bld: 146 mg/dL — ABNORMAL HIGH (ref 70–99)
Glucose, Bld: 130 mg/dL — ABNORMAL HIGH (ref 70–99)
Potassium: 3.8 mmol/L (ref 3.5–5.1)
Potassium: 3.9 mmol/L (ref 3.5–5.1)
Sodium: 140 mmol/L (ref 135–145)
Sodium: 138 mmol/L (ref 135–145)

## 2019-08-07 LAB — URINE DRUG SCREEN, QUALITATIVE (ARMC ONLY)
Amphetamines, Ur Screen: NOT DETECTED
Barbiturates, Ur Screen: NOT DETECTED
Benzodiazepine, Ur Scrn: NOT DETECTED
Cannabinoid 50 Ng, Ur ~~LOC~~: POSITIVE — AB
Cocaine Metabolite,Ur ~~LOC~~: NOT DETECTED
MDMA (Ecstasy)Ur Screen: NOT DETECTED
Methadone Scn, Ur: NOT DETECTED
Opiate, Ur Screen: POSITIVE — AB
Phencyclidine (PCP) Ur S: NOT DETECTED
Tricyclic, Ur Screen: NOT DETECTED

## 2019-08-07 LAB — PHOSPHORUS: Phosphorus: 3.6 mg/dL (ref 2.5–4.6)

## 2019-08-07 LAB — CBC
HCT: 38.3 % — ABNORMAL LOW (ref 39.0–52.0)
Hemoglobin: 13 g/dL (ref 13.0–17.0)
MCH: 29 pg (ref 26.0–34.0)
MCHC: 33.9 g/dL (ref 30.0–36.0)
MCV: 85.3 fL (ref 80.0–100.0)
Platelets: 271 10*3/uL (ref 150–400)
RBC: 4.49 MIL/uL (ref 4.22–5.81)
RDW: 12.6 % (ref 11.5–15.5)
WBC: 17.6 10*3/uL — ABNORMAL HIGH (ref 4.0–10.5)
nRBC: 0 % (ref 0.0–0.2)

## 2019-08-07 LAB — FIBRIN DERIVATIVES D-DIMER (ARMC ONLY): Fibrin derivatives D-dimer (ARMC): 452.24 ng/mL (FEU) (ref 0.00–499.00)

## 2019-08-07 LAB — C-REACTIVE PROTEIN: CRP: <0.8 mg/dL (ref <1.0)

## 2019-08-07 LAB — FERRITIN: Ferritin: 296 ng/mL (ref 24–336)

## 2019-08-07 LAB — TROPONIN I (HIGH SENSITIVITY)
Troponin I (High Sensitivity): >27000 ng/L (ref <18)
Troponin I (High Sensitivity): >27000 ng/L (ref <18)

## 2019-08-07 LAB — ECHOCARDIOGRAM COMPLETE
Height: 76 in
Weight: 4186.98 oz

## 2019-08-07 LAB — LACTATE DEHYDROGENASE: LDH: 534 U/L — ABNORMAL HIGH (ref 98–192)

## 2019-08-07 LAB — TSH: TSH: 0.419 u[IU]/mL (ref 0.350–4.500)

## 2019-08-07 LAB — HIV ANTIBODY (ROUTINE TESTING W REFLEX): HIV Screen 4th Generation wRfx: NONREACTIVE

## 2019-08-07 LAB — GLUCOSE, CAPILLARY: Glucose-Capillary: 124 mg/dL — ABNORMAL HIGH (ref 70–99)

## 2019-08-07 LAB — MRSA PCR SCREENING: MRSA by PCR: NEGATIVE

## 2019-08-07 LAB — CK: Total CK: 4410 U/L — ABNORMAL HIGH (ref 49–397)

## 2019-08-07 LAB — MAGNESIUM: Magnesium: 2.2 mg/dL (ref 1.7–2.4)

## 2019-08-07 MED ORDER — ZINC SULFATE 220 (50 ZN) MG PO CAPS
220.0000 mg | ORAL_CAPSULE | Freq: Every day | ORAL | Status: DC
  Administered 2019-08-07 – 2019-08-08 (×2): 220 mg via ORAL
  Filled 2019-08-07 (×2): qty 1

## 2019-08-07 MED ORDER — MORPHINE SULFATE (PF) 2 MG/ML IV SOLN
INTRAVENOUS | Status: AC
  Filled 2019-08-07: qty 1

## 2019-08-07 MED ORDER — VITAMIN C 500 MG PO TABS
1000.0000 mg | ORAL_TABLET | Freq: Every day | ORAL | Status: DC
  Administered 2019-08-07: 1000 mg via ORAL
  Filled 2019-08-07: qty 2

## 2019-08-07 MED ORDER — LISINOPRIL 5 MG PO TABS
2.5000 mg | ORAL_TABLET | Freq: Every day | ORAL | Status: DC
  Administered 2019-08-07 – 2019-08-08 (×2): 2.5 mg via ORAL
  Filled 2019-08-07 (×2): qty 1

## 2019-08-07 MED ORDER — HEPARIN SODIUM (PORCINE) 5000 UNIT/ML IJ SOLN
5000.0000 [IU] | Freq: Three times a day (TID) | INTRAMUSCULAR | Status: DC
  Administered 2019-08-07 – 2019-08-08 (×4): 5000 [IU] via SUBCUTANEOUS
  Filled 2019-08-07 (×4): qty 1

## 2019-08-07 MED ORDER — MORPHINE SULFATE (PF) 2 MG/ML IV SOLN
2.0000 mg | INTRAVENOUS | Status: DC | PRN
  Administered 2019-08-07: 2 mg via INTRAVENOUS

## 2019-08-07 MED ORDER — SODIUM CHLORIDE 0.9% FLUSH
10.0000 mL | Freq: Two times a day (BID) | INTRAVENOUS | Status: DC
  Administered 2019-08-08: 10 mL via INTRAVENOUS

## 2019-08-07 MED ORDER — METOPROLOL SUCCINATE ER 50 MG PO TB24
25.0000 mg | ORAL_TABLET | Freq: Every day | ORAL | Status: DC
  Administered 2019-08-07 – 2019-08-08 (×2): 25 mg via ORAL
  Filled 2019-08-07 (×2): qty 1

## 2019-08-07 MED ORDER — VITAMIN D 25 MCG (1000 UNIT) PO TABS
1000.0000 [IU] | ORAL_TABLET | Freq: Every day | ORAL | Status: DC
  Administered 2019-08-07 – 2019-08-08 (×2): 1000 [IU] via ORAL
  Filled 2019-08-07 (×2): qty 1

## 2019-08-07 MED ORDER — SODIUM CHLORIDE 0.9% FLUSH: 10.0000 mL | INTRAVENOUS | Status: DC | PRN

## 2019-08-07 MED ORDER — FAMOTIDINE 20 MG PO TABS
20.0000 mg | ORAL_TABLET | Freq: Every day | ORAL | Status: DC
  Administered 2019-08-07 – 2019-08-08 (×2): 20 mg via ORAL
  Filled 2019-08-07 (×2): qty 1

## 2019-08-07 NOTE — Progress Notes (Signed)
[facility] Cardiology  SUBJECTIVE: Patient laying in bed, reports feeling much better, with mild residual chest discomfort   Vitals:   08/07/19 0630 08/07/19 0700 08/07/19 0800 08/07/19 0900  BP: 110/71 120/72 119/72 115/84  Pulse: 72 77 77 80  Resp: 19 12 10    Temp:   97.9 F (36.6 C)   TempSrc:   Oral   SpO2:   95%   Weight:      Height:         Intake/Output Summary (Last 24 hours) at 08/07/2019 0953 Last data filed at 08/07/2019 0900 Gross per 24 hour  Intake 940.39 ml  Output 1050 ml  Net -109.61 ml      PHYSICAL EXAM  General: Well developed, well nourished, in no acute distress HEENT:  Normocephalic and atramatic Neck:  No JVD.  Lungs: Clear bilaterally to auscultation and percussion. Heart: HRRR . Normal S1 and S2 without gallops or murmurs.  Abdomen: Bowel sounds are positive, abdomen soft and non-tender  Msk:  Back normal, normal gait. Normal strength and tone for age. Extremities: No clubbing, cyanosis or edema.   Neuro: Alert and oriented X 3. Psych:  Good affect, responds appropriately   LABS: Basic Metabolic Panel: Recent Labs    08/07/19 0145 08/07/19 0359  NA 140 138  K 3.8 3.9  CL 104 104  CO2 27 26  GLUCOSE 146* 130*  BUN 21* 21*  CREATININE 1.29* 1.07  CALCIUM 9.3 8.8*  MG 2.2  --   PHOS 3.6  --    Liver Function Tests: No results for input(s): AST, ALT, ALKPHOS, BILITOT, PROT, ALBUMIN in the last 72 hours. No results for input(s): LIPASE, AMYLASE in the last 72 hours. CBC: Recent Labs    08/06/19 2133 08/07/19 0359  WBC 13.9* 17.6*  HGB 13.6 13.0  HCT 40.3 38.3*  MCV 83.8 85.3  PLT 352 271   Cardiac Enzymes: Recent Labs    08/07/19 0533  CKTOTAL 4,410*   BNP: Invalid input(s): POCBNP D-Dimer: No results for input(s): DDIMER in the last 72 hours. Hemoglobin A1C: No results for input(s): HGBA1C in the last 72 hours. Fasting Lipid Panel: No results for input(s): CHOL, HDL, LDLCALC, TRIG, CHOLHDL, LDLDIRECT in the last 72  hours. Thyroid Function Tests: Recent Labs    08/06/19 2347  TSH 0.419   Anemia Panel: Recent Labs    08/07/19 0533  FERRITIN 296    Dg Chest Portable 1 View  Result Date: 08/06/2019 CLINICAL DATA:  Chest pain. EXAM: PORTABLE CHEST 1 VIEW COMPARISON:  Chest x-ray dated May 25, 2015. FINDINGS: The heart size and mediastinal contours are within normal limits. Both lungs are clear. The visualized skeletal structures are unremarkable. IMPRESSION: No active disease. Electronically Signed   By: May 27, 2015 M.D.   On: 08/06/2019 22:01     Echo pending  TELEMETRY: Sinus rhythm:  ASSESSMENT AND PLAN:  Active Problems:   STEMI (ST elevation myocardial infarction) (HCC)   Acute ST elevation myocardial infarction (STEMI) involving left anterior descending (LAD) coronary artery (HCC)    1.  Acute anterolateral ST elevation myocardial infarction, status post primary PCI with DES proximal LAD 2.  Mild ischemic cardiomyopathy 3.  Polysubstance abuse, and tox screen positive for marijuana, negative for cocaine 4.  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  COVID-19 with elevated white count, otherwise asymptomatic  Recommendations  1.  Continue current medications 2.  Continue dual antiplatelet therapy 3.  Start low-dose metoprolol succinate  25 mg daily 4.  Start low-dose lisinopril 2.5 mg daily 5.  2D echocardiogram 6.  Wrongly advised patient to abstain from illicit drugs 7.  Patient stable for discharge 08/08/2019 from cardiovascular perspective   Isaias Cowman, MD, PhD, [facility] 08/07/2019 9:53 AM

## 2019-08-07 NOTE — Progress Notes (Signed)
Patient admitted for STEMI.  COVID 19+. Followed by cardiology and ICU team at this time. We will see patient wants transfer to medical floor.

## 2019-08-07 NOTE — Consult Note (Signed)
PULMONARY / CRITICAL CARE MEDICINE  Name: Ricardo Yang MRN: [id number] DOB: Jun 17, 1976    LOS: 1  Referring Provider:  Dr Darrold Junker Reason for Referral:  STEMI  HPI:  This is a 43 y/o male with a medical history as indicated below who presented with chest pain; ruled in for an STEMI and was emergently taken to the cath lab and found to have a complete LAD occlusion. He is now s/p DES placement in the LAD and doing well post procedure  Past Medical History:  Diagnosis Date  . Multiple sclerosis (HCC)    a. question possible MS  . Obesity   . Polysubstance abuse (HCC)    a. tobacco, cocaine, crack, ecstasy, pills, "anything but injections"   Past Surgical History:  Procedure Laterality Date  . CARDIAC CATHETERIZATION N/A 05/28/2015   Procedure: Left Heart Cath and Coronary Angiography;  Surgeon: Antonieta Iba, MD;  Location: [facility] INVASIVE CV LAB;  Service: Cardiovascular;  Laterality: N/A;   No current facility-administered medications on file prior to encounter.    Current Outpatient Medications on File Prior to Encounter  Medication Sig  . amoxicillin (AMOXIL) 500 MG capsule Take 1 capsule (500 mg total) by mouth 3 (three) times daily.  Marland Kitchen ibuprofen (ADVIL,MOTRIN) 600 MG tablet Take 1 tablet (600 mg total) by mouth every 8 (eight) hours as needed.    Allergies No Known Allergies  Family History Family History  Problem Relation Age of Onset  . Heart disease Father    Social History  reports that he has been smoking. He has never used smokeless tobacco. He reports current drug use. Frequency: 1.00 time per week. Drugs: "Crack" cocaine, Amphetamines, Cocaine, Hashish, Marijuana, and MDMA (Ecstacy). He reports that he does not drink alcohol.  Review Of Systems:   Constitutional: Negative for fever and chills.  HENT: Negative for congestion and rhinorrhea.  Eyes: Negative for redness and visual disturbance.  Respiratory: Negative for shortness of breath and wheezing.   Cardiovascular: Chest pain and dyspnea have resolved  Gastrointestinal: Negative  for nausea , vomiting and abdominal pain and  Loose stools Genitourinary: Negative for dysuria and urgency.  Endocrine: Denies polyuria, polyphagia and heat intolerance Musculoskeletal: Negative for myalgias and arthralgias.  Skin: Negative for pallor and wound.  Neurological: Negative for dizziness and headaches   VITAL SIGNS: BP 118/74   Pulse 76   Temp 98.1 F (36.7 C) (Oral)   Resp 19   Ht 6\' 4"  (1.93 m)   Wt 118.7 kg   SpO2 98%   BMI 31.85 kg/m   HEMODYNAMICS:    VENTILATOR SETTINGS:    INTAKE / OUTPUT: No intake/output data recorded.  PHYSICAL EXAMINATION: Unable to obtain. Patient is COVID +. Per [place] policy, this writer cannot have direct contact with patient   LABS:  BMET Recent Labs  Lab 08/06/19 2133 08/07/19 0145  NA 140 140  K 3.6 3.8  CL 105 104  CO2 20* 27  BUN 22* 21*  CREATININE 1.67* 1.29*  GLUCOSE 198* 146*    Electrolytes Recent Labs  Lab 08/06/19 2133 08/07/19 0145  CALCIUM 9.7 9.3  MG  --  2.2  PHOS  --  3.6    CBC Recent Labs  Lab 08/06/19 2133  WBC 13.9*  HGB 13.6  HCT 40.3  PLT 352    Coag's No results for input(s): APTT, INR in the last 168 hours.  Sepsis Markers No results for input(s): LATICACIDVEN, PROCALCITON, O2SATVEN in the last 168 hours.  ABG No results for input(s): PHART, PCO2ART, PO2ART in the last 168 hours.  Liver Enzymes No results for input(s): AST, ALT, ALKPHOS, BILITOT, ALBUMIN in the last 168 hours.  Cardiac Enzymes No results for input(s): TROPONINI, PROBNP in the last 168 hours.  Glucose Recent Labs  Lab 08/06/19 2343  GLUCAP 124*    Imaging Dg Chest Portable 1 View  Result Date: 08/06/2019 CLINICAL DATA:  Chest pain. EXAM: PORTABLE CHEST 1 VIEW COMPARISON:  Chest x-ray dated May 25, 2015. FINDINGS: The heart size and mediastinal contours are within normal limits. Both lungs are clear.  The visualized skeletal structures are unremarkable. IMPRESSION: No active disease. Electronically Signed   By: Titus Dubin M.D.   On: 08/06/2019 22:01    STUDIES:  Conclusion    Prox LAD lesion is 100% stenosed.  A drug-eluting stent was successfully placed using a STENT RESOLUTE ONYX 4.5X22.  Post intervention, there is a 0% residual stenosis.   1.  Anterolateral ST elevation myocardial infarction 2.  Acute 100% occlusion proximal LAD 3.  Mildly reduced left ventricular function, with anterior wall hypokinesis 4.  Successful primary PCI with DES proximal LAD     CULTURES: None  ANTIBIOTICS: none  SIGNIFICANT EVENTS: 08/06/2019: Admitted with anterolateral wall ST elevated MI  LINES/TUBES: PIVs  ASSESSMENT  Anterolateral ST elevation myocardial infarction Polysubstance abuse H/O coronary spasm from cocaine in 2016  PLAN Hemodynamics per ICU protocol Urine drug screen Post LHC labs BP control Smoking cessation Rest of the treatment plan per cardiology   Best Practice: Code Status:full cose Diet: heart healthy GI prophylaxis: not indicated VTE prophylaxis: SCDs and SQ heaprin   FAMILY  - Updates: Patient and family updated by cardiologist. Will update with any new changes in treatment plan   S. Tukov-Yual, ANP-BC Pulmonary and [place] Pager [phone] or [phone]  NB: This document was prepared using Dragon voice recognition software and may include unintentional dictation errors.    08/07/2019, 3:55 AM

## 2019-08-07 NOTE — Consult Note (Signed)
NAME: Ricardo Yang  DOB: 07/27/1976  MRN: [id number]  Date/Time: 08/07/2019 9:42 AM  REQUESTING PROVIDER: Anna Genreuma Subjective:  REASON FOR CONSULT: covid ?Virtual consult  Ricardo Yang is a 43 y.o. male with a history of HTN,tested covid in the jail sept 1st week admitted with sudden onset chest pain. Pt says he was in [place] [facility] for the past 3 months and was released 2 days ago. To celebrae his release he went and had a few drinks and smoked weed with his friends. He developed sudden onset central chest pain for nearly 30 min and when EMS arrived he had ST elevation in anterior leads. CODE STEMI called enroute- He was taken to Cardiac cath and found to have 100% LAD stenosis and underwent DES. His covid test was positive. Pt is a smoker and prior to going to jil found to have high BP and was prescribed lisinopril by a doctor friend. He does not ave insurance. 4 years ago he was diagnosed with MI due to vasospasm from cocaine. He has not been taking any meds. He has no fever or cough. While in the jail he tested positive for COVID 19 , the first week of September and was in quarantine for 14 days He says he lost 30 pound sin jail and was running there  Past Medical History:  Diagnosis Date  . Multiple sclerosis (HCC)    a. question possible MS  . Obesity   . Polysubstance abuse (HCC)    a. tobacco, cocaine, crack, ecstasy, pills, "anything but injections"    Past Surgical History:  Procedure Laterality Date  . CARDIAC CATHETERIZATION N/A 05/28/2015   Procedure: Left Heart Cath and Coronary Angiography;  Surgeon: Antonieta Ibaimothy J Gollan, MD;  Location: [facility] INVASIVE CV LAB;  Service: Cardiovascular;  Laterality: N/A;    Social History   Socioeconomic History  . Marital status: Single    Spouse name: Not on file  . Number of children: Not on file  . Years of education: Not on file  . Highest education level: Not on file  Occupational History  . Not on file  Social Needs  . Financial  resource strain: Not on file  . Food insecurity    Worry: Not on file    Inability: Not on file  . Transportation needs    Medical: Not on file    Non-medical: Not on file  Tobacco Use  . Smoking status: Current Every Day Smoker  . Smokeless tobacco: Never Used  Substance and Sexual Activity  . Alcohol use: No    Alcohol/week: 0.0 standard drinks  . Drug use: Yes    Frequency: 1.0 times per week    Types: "Crack" cocaine, Amphetamines, Cocaine, Hashish, Marijuana, MDMA (Ecstacy)  . Sexual activity: Yes    Partners: Female  Lifestyle  . Physical activity    Days per week: Not on file    Minutes per session: Not on file  . Stress: Not on file  Relationships  . Social Musicianconnections    Talks on phone: Not on file    Gets together: Not on file    Attends religious service: Not on file    Active member of club or organization: Not on file    Attends meetings of clubs or organizations: Not on file    Relationship status: Not on file  . Intimate partner violence    Fear of current or ex partner: Not on file    Emotionally abused: Not on file  Physically abused: Not on file    Forced sexual activity: Not on file  Other Topics Concern  . Not on file  Social History Narrative  . Not on file    Family History  Problem Relation Age of Onset  . Heart disease Father    No Known Allergies  ? Current Facility-Administered Medications  Medication Dose Route Frequency Provider Last Rate Last Dose  . 0.9 %  sodium chloride infusion  250 mL Intravenous PRN Paraschos, Alexander, MD      . 0.9% sodium chloride infusion  1 mL/kg/hr Intravenous Continuous Paraschos, Alexander, MD 128.4 mL/hr at 08/07/19 0506 1 mL/kg/hr at 08/07/19 0506  . acetaminophen (TYLENOL) tablet 650 mg  650 mg Oral Q4H PRN Jimmye Norman, NP   650 mg at 08/07/19 0844  . aspirin chewable tablet 81 mg  81 mg Oral Daily Paraschos, Alexander, MD   81 mg at 08/07/19 0843  . atorvastatin (LIPITOR) tablet 80 mg   80 mg Oral q1800 Paraschos, Alexander, MD      . Chlorhexidine Gluconate Cloth 2 % PADS 6 each  6 each Topical Q0600 Tukov-Yual, Magdalene S, NP   6 each at 08/07/19 0000  . cholecalciferol (VITAMIN D3) tablet 1,000 Units  1,000 Units Oral Daily Jimmye Norman, NP   1,000 Units at 08/07/19 0843  . famotidine (PEPCID) tablet 20 mg  20 mg Oral Daily Jimmye Norman, NP   20 mg at 08/07/19 0843  . heparin injection 5,000 Units  5,000 Units Subcutaneous Q8H Tukov-Yual, Magdalene S, NP   5,000 Units at 08/07/19 0509  . morphine 2 MG/ML injection 2 mg  2 mg Intravenous Q3H PRN Tukov-Yual, Magdalene S, NP   2 mg at 08/07/19 0533  . norepinephrine (LEVOPHED) 4mg  in premix infusion  0-40 mcg/min Intravenous Continuous , MD 9.38 mL/hr at 08/06/19 2317 2.5 mcg/min at 08/06/19 2317  . ondansetron (ZOFRAN) injection 4 mg  4 mg Intravenous Once 10/06/19, MD      . ondansetron [facility]) injection 4 mg  4 mg Intravenous Q6H PRN Paraschos, Alexander, MD   4 mg at 08/07/19 0535  . sodium chloride flush (NS) 0.9 % injection 3 mL  3 mL Intravenous Q12H Paraschos, Alexander, MD      . sodium chloride flush (NS) 0.9 % injection 3 mL  3 mL Intravenous PRN Paraschos, Alexander, MD      . ticagrelor (BRILINTA) tablet 90 mg  90 mg Oral BID 10/07/19, MD   90 mg at 08/07/19 0843  . vitamin C (ASCORBIC ACID) tablet 1,000 mg  1,000 mg Oral Daily 10/07/19, NP   1,000 mg at 08/07/19 0842  . zinc sulfate capsule 220 mg  220 mg Oral Daily 10/07/19, NP   220 mg at 08/07/19 0844     Abtx:  Anti-infectives (From admission, onward)   None      REVIEW OF SYSTEMS:  Const: negative fever, negative chills, intentional  weight loss Eyes: negative diplopia or visual changes, negative eye pain ENT: negative coryza, negative sore throat Resp: negative cough, hemoptysis, dyspnea Cards:  chest pain, palpitations,no lower extremity edema GU: negative  for frequency, dysuria and hematuria GI: Negative for abdominal pain, diarrhea, bleeding, constipation Skin: negative for rash and pruritus Heme: negative for easy bruising and gum/nose bleeding MS: negative for myalgias, arthralgias, back pain and muscle weakness Neurolo:negative for headaches, dizziness, vertigo, memory problems  Psych: negative for feelings of anxiety, depression  Endocrine:  negative for thyroid, diabetes Allergy/Immunology- negative for any medication or food allergies ? Pertinent Positives include : Objective:  VITALS:  BP 115/84   Pulse 80   Temp 97.9 F (36.6 C) (Oral)   Resp 10   Ht 6\' 4"  (1.93 m)   Wt 118.7 kg   SpO2 95%   BMI 31.85 kg/m  PHYSICAL EXAM:  Not done Pertinent Labs Lab Results CBC    Component Value Date/Time   WBC 17.6 (H) 08/07/2019 0359   RBC 4.49 08/07/2019 0359   HGB 13.0 08/07/2019 0359   HCT 38.3 (L) 08/07/2019 0359   PLT 271 08/07/2019 0359   MCV 85.3 08/07/2019 0359   MCH 29.0 08/07/2019 0359   MCHC 33.9 08/07/2019 0359   RDW 12.6 08/07/2019 0359    CMP Latest Ref Rng & Units 08/07/2019 08/07/2019 08/06/2019  Glucose 70 - 99 mg/dL 130(H) 146(H) 198(H)  BUN 6 - 20 mg/dL 21(H) 21(H) 22(H)  Creatinine 0.61 - 1.24 mg/dL 1.07 1.29(H) 1.67(H)  Sodium 135 - 145 mmol/L 138 140 140  Potassium 3.5 - 5.1 mmol/L 3.9 3.8 3.6  Chloride 98 - 111 mmol/L 104 104 105  CO2 22 - 32 mmol/L 26 27 20(L)  Calcium 8.9 - 10.3 mg/dL 8.8(L) 9.3 9.7  Total Protein 6.5 - 8.1 g/dL - - -  Total Bilirubin 0.3 - 1.2 mg/dL - - -  Alkaline Phos 38 - 126 U/L - - -  AST 15 - 41 U/L - - -  ALT 17 - 63 U/L - - -      Microbiology: Recent Results (from the past 240 hour(s))  SARS Coronavirus 2 by RT PCR (hospital order, performed in [place] hospital lab) Nasopharyngeal Nasopharyngeal Swab     Status: Abnormal   Collection Time: 08/06/19  9:33 PM   Specimen: Nasopharyngeal Swab  Result Value Ref Range Status   SARS Coronavirus 2 POSITIVE (A)  NEGATIVE Final    Comment: RESULT CALLED TO, READ BACK BY AND VERIFIED WITH: ANN PRIDE @2304  08/06/2019 TTG  (NOTE) If result is NEGATIVE SARS-CoV-2 target nucleic acids are NOT DETECTED. The SARS-CoV-2 RNA is generally detectable in upper and lower  respiratory specimens during the acute phase of infection. The lowest  concentration of SARS-CoV-2 viral copies this assay can detect is 250  copies / mL. A negative result does not preclude SARS-CoV-2 infection  and should not be used as the sole basis for treatment or other  patient management decisions.  A negative result may occur with  improper specimen collection / handling, submission of specimen other  than nasopharyngeal swab, presence of viral mutation(s) within the  areas targeted by this assay, and inadequate number of viral copies  (<250 copies / mL). A negative result must be combined with clinical  observations, patient history, and epidemiological information. If result is POSITIVE SARS-CoV-2 target nucleic acids are DETECTED. The S ARS-CoV-2 RNA is generally detectable in upper and lower  respiratory specimens during the acute phase of infection.  Positive  results are indicative of active infection with SARS-CoV-2.  Clinical  correlation with patient history and other diagnostic information is  necessary to determine patient infection status.  Positive results do  not rule out bacterial infection or co-infection with other viruses. If result is PRESUMPTIVE POSTIVE SARS-CoV-2 nucleic acids MAY BE PRESENT.   A presumptive positive result was obtained on the submitted specimen  and confirmed on repeat testing.  While 2019 novel coronavirus  (SARS-CoV-2) nucleic acids may be present  in the submitted sample  additional confirmatory testing may be necessary for epidemiological  and / or clinical management purposes  to differentiate between  SARS-CoV-2 and other Sarbecovirus currently known to infect humans.  If clinically  indicated additional testing with an alternate test  methodology [phone]) is adv ised. The SARS-CoV-2 RNA is generally  detectable in upper and lower respiratory specimens during the acute  phase of infection. The expected result is Negative. Fact Sheet for Patients:  BoilerBrush.com.cy Fact Sheet for Healthcare Providers: https://pope.com/ This test is not yet approved or cleared by the [place] FDA and has been authorized for detection and/or diagnosis of SARS-CoV-2 by FDA under an Emergency Use Authorization (EUA).  This EUA will remain in effect (meaning this test can be used) for the duration of the COVID-19 declaration under Section 564(b)(1) of the Act, 21 U.S.C. section 360bbb-3(b)(1), unless the authorization is terminated or revoked sooner. Performed at [facility], [address] [place], [place] [address]   MRSA PCR Screening     Status: None   Collection Time: 08/07/19 12:03 AM   Specimen: Nasopharyngeal  Result Value Ref Range Status   MRSA by PCR NEGATIVE NEGATIVE Final    Comment:        The GeneXpert MRSA Assay (FDA approved for NASAL specimens only), is one component of a comprehensive MRSA colonization surveillance program. It is not intended to diagnose MRSA infection nor to guide or monitor treatment for MRSA infections. Performed at [facility], [address] [place], [place] [address]     IMAGING RESULTS: I have personally reviewed the films ? Impression/Recommendation ? SARS cov 2 test positive - first tested positive  sept 1st week in jail- he was always asymptomatic.  The Ct value is E zero and N2 is 41 indicating he is not infectious and this is a remote infection.HE is not infectious and will not need airborne isolation.So no treatment for COVID  is needed.  ACUTE STEMI: Anterolateral MI S/p DES to LAD-on ticegrelor, atorvastatin and aspirin Explained to him that  he will have to take the medication every day after discharge , otherwise he can thrombose the stent. [place]- counseled on quitting- he did not smoke for 3 months while in the [place] ? ID will sign off __________________________________________________ Discussed with patient, and hospitalist

## 2019-08-08 ENCOUNTER — Encounter: Payer: Self-pay | Admitting: Cardiology

## 2019-08-08 LAB — TROPONIN I (HIGH SENSITIVITY): Troponin I (High Sensitivity): >27000 ng/L (ref <18)

## 2019-08-08 LAB — BASIC METABOLIC PANEL
Anion gap: 4 — ABNORMAL LOW (ref 5–15)
BUN: 12 mg/dL (ref 6–20)
CO2: 26 mmol/L (ref 22–32)
Calcium: 8.4 mg/dL — ABNORMAL LOW (ref 8.9–10.3)
Chloride: 108 mmol/L (ref 98–111)
Creatinine, Ser: 0.79 mg/dL (ref 0.61–1.24)
GFR calc Af Amer: >60 mL/min (ref >60)
GFR calc non Af Amer: >60 mL/min (ref >60)
Glucose, Bld: 106 mg/dL — ABNORMAL HIGH (ref 70–99)
Potassium: 3.6 mmol/L (ref 3.5–5.1)
Sodium: 138 mmol/L (ref 135–145)

## 2019-08-08 LAB — PHOSPHORUS: Phosphorus: 2.8 mg/dL (ref 2.5–4.6)

## 2019-08-08 LAB — MAGNESIUM: Magnesium: 2 mg/dL (ref 1.7–2.4)

## 2019-08-08 MED ORDER — TICAGRELOR 90 MG PO TABS: 90.0000 mg | ORAL_TABLET | Freq: Two times a day (BID) | ORAL | 0 refills | Status: DC

## 2019-08-08 MED ORDER — ATORVASTATIN CALCIUM 80 MG PO TABS: 80.0000 mg | ORAL_TABLET | Freq: Every day | ORAL | 0 refills | Status: DC

## 2019-08-08 MED ORDER — LISINOPRIL 2.5 MG PO TABS: 2.5000 mg | ORAL_TABLET | Freq: Every day | ORAL | 0 refills | Status: DC

## 2019-08-08 MED ORDER — INFLUENZA VAC SPLIT QUAD 0.5 ML IM SUSY
0.5000 mL | PREFILLED_SYRINGE | Freq: Once | INTRAMUSCULAR | Status: AC
  Administered 2019-08-08: 0.5 mL via INTRAMUSCULAR
  Filled 2019-08-08: qty 0.5

## 2019-08-08 MED ORDER — ASPIRIN 81 MG PO CHEW: 81.0000 mg | CHEWABLE_TABLET | Freq: Every day | ORAL | 0 refills | Status: DC

## 2019-08-08 MED ORDER — METOPROLOL SUCCINATE ER 25 MG PO TB24: 25.0000 mg | ORAL_TABLET | Freq: Every day | ORAL | 0 refills | Status: DC

## 2019-08-08 NOTE — Discharge Instructions (Signed)
It was so nice to meet you during this hospitalization!  You came into the hospital with chest pain. We found hat you had a heart attack. 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  We have started you on the following medications: 1. Aspirin 81mg  daily- this is a medicine to help protect your heart from future heart attacks and prevent your stent from getting clogged. 2. Brilinta 90mg  twice a day- this is also a medicine to help your stent from getting clogged 3. Lipitor 80mg  daily- this is a cholesterol medicine 4. Lisinopril 2.5mg  daily- this is a medicine to help your blood pressure and help protect your heart 5. Metoprolol 25mg  daily- this is also a medicine to help protect your heart  Take care, Dr. Nancy Marus  Exercise Guidelines During Cardiac Rehabilitation When you are recovering from a heart condition, such as from heart surgery, heart attack, or heart failure, it is important to have heart-healthy habits, including exercise routines. Discuss an appropriate exercise program with your heart specialist (cardiologist) and rehabilitation therapist. It is important to design a program that is safe and effective for you. The program should meet your specific abilities and needs. Walking, biking, jogging, and swimming are all good aerobic activities. These take light to moderate effort. Adding some light resistance training is also important. Even simple lifestyle changes can help. These lifestyle changes may include parking farther from the store or taking the stairs instead of the elevator. At first, you may begin exercising under supervision, such as at a hospital or clinic. Over time, you may begin exercising at home, with your health care provider's approval. Types of exercise Aerobic exercise During cardiac rehabilitation, it is important to do aerobic activities. Aerobic exercise keeps joints and muscles moving. It involves large muscle groups. It is also rhythmic and must be  done for a longer period of time. Doing these exercises improves circulation and endurance. Examples of aerobic exercise include:  Swimming.  Walking.  Hiking.  Jogging.  Cross-country skiing.  Biking.  Dancing.  Static exercise Static exercise (isometric exercise) uses muscles at high intensities without moving the joints. Some examples of static exercise include pushing against a heavy couch that does not move, doing a wall sit, or holding a plank position. Static exercise improves strength but also quickly increases blood pressure. Follow these guidelines:  If you have circulation problems or high blood pressure, talk with your health care provider before starting any static exercise routines. Do not do static exercises if your health care provider tells you not to.  Do not hold your breath while doing static exercises. Holding your breath during static exercises can raise your blood pressure to a dangerously high level.  Weight-resistance exercise Weight-resistance exercises are another important part of rehabilitation. These exercises strengthen your muscles by making them work against resistance. Resistance exercises may help you return to activities of daily living sooner and improve your quality of life. They also help reduce cardiac risk factors. Examples of weight-resistance exercise include using:  Free weights.  Weight-lifting machines.  Large, specially designed rubber bands. You will usually do weight-resistance exercises 2 times a week with a 2-day rest period between workouts. Stretching Stretching before you exercise warms up your muscles and prevents injury. Stretching also improves your flexibility, balance, coordination, and range of motion. Follow these guidelines:  Stretch both before and after exercising.  Do not force a muscle or joint into a painful angle. Stretching should be a relaxing part  of your exercise routine.  Once you feel resistance in your  muscle, hold the stretch for a few seconds. Make sure you keep breathing while you hold the stretch.  Go slowly when doing all stretches. Setting a pace  Choose a pace that is comfortable for you. ? You should be able to talk while exercising. If you are short of breath or unable to speak while you exercise, slow down. ? If you are able to sing while exercising, you are not exercising hard enough.  Keep track of how hard you are working as you exercise (exertion level). Your rehabilitation therapist can teach you to use a mental scale to measure your level of exertion (perceived exertion). Using a mental scale, you will think about your exertion level and rate it in a range from 6 to 20. ? A rating of 6 to 9. This means that you are doing "very light" exercise and are not exerting yourself enough. For a healthy person, this may be walking at a slow pace. ? A rating of 11 to 15. This is exercise that is "somewhat hard." For a healthy exercise session, you should aim for an exertion rate that is within this range. ? A rating of 16 to 17. This is considered "very hard" or strenuous. For a healthy person, exercise at this rating may start to feel heavy and difficult. ? A rating of 19 to 20. This means that you are working "extremely hard." For most people, these numbers represent the hardest you've ever worked to exercise.  Your health care provider or cardiac rehabilitation specialist may also recommend that you wear a heart rate monitor while you exercise. This will help you keep track of your heart rate zones and how hard your heart is working. Frequency As you are recovering, it is important to start exercising slowly and to gradually work up to your goal. Work with your health care provider to set up an exercise routine that works for you. Generally, cardiac rehabilitation exercise should include:  40 minutes of aerobic activity 3 - 4 days a week.  Stretching and strength exercises 2 - 3 days a  week. Contact a doctor if:  You have any of the following symptoms while exercising: ? Pain, pressure, or burning in your chest, jaw, shoulder, or back (angina). ? Lightheadedness. ? Dizziness. ? Irregular or fast heartbeat. ? Shortness of breath.  You are extremely tired after exercising. Get help right away if:  You have angina that does not get better with medicine and lasts for more than 5 minutes.  You have nausea or you vomit.  You have excessive sweating that is not caused by exercise. Summary  When you are recovering from a heart condition, it is important to have heart-healthy habits, including exercise routines.  At first, you may begin exercising under supervision, such as at a hospital or clinic. Over time, you may begin exercising at home, with your health care provider's approval.  Aim for 40 minutes of aerobic exercises 3 - 4 days a week.  Aim to do stretching and strength exercises 2 - 3 days a week.  Choose a pace that is comfortable for you. You should be able to talk while exercising. This information is not intended to replace advice given to you by your health care provider. Make sure you discuss any questions you have with your health care provider. Document Released: 10/18/2013 Document Revised: 09/25/2017 Document Reviewed: 09/12/2016 Elsevier Patient Education  2020 [employer].  Heart Attack A heart attack occurs when blood and oxygen supply to the heart is cut off. A heart attack causes damage to the heart that cannot be fixed. A heart attack is also called a myocardial infarction, or MI. If you think you are having a heart attack, do not wait to see if the symptoms will go away. Get medical help right away. What are the causes? This condition may be caused by:  A fatty substance (plaque) in the blood vessels (arteries). This can block the flow of blood to the heart.  A blood clot in the blood vessels that go to the heart. The blood clot blocks  blood flow.  Low blood pressure.  An abnormal heartbeat.  Some diseases, such as problems in red blood cells (anemia)orproblems in breathing (respiratory failure).  Tightening (spasm) of a blood vessel that cuts off blood to the heart.  A tear in a blood vessel of the heart.  High blood pressure. What increases the risk? The following factors may make you more likely to develop this condition:  Aging. The older you are, the higher your risk.  Having a personal or family history of chest pain, heart attack, stroke, or narrowing of the arteries in the legs, arms, head, or stomach (peripheral artery disease).  Being male.  Smoking.  Not getting regular exercise.  Being overweight or obese.  Having high blood pressure.  Having high cholesterol.  Having diabetes.  Drinking too much alcohol.  Using illegal drugs, such as cocaine or methamphetamine. What are the signs or symptoms? Symptoms of this condition include:  Chest pain. It may feel like: ? Crushing or squeezing. ? Tightness, pressure, fullness, or heaviness.  Pain in the arm, neck, jaw, back, or upper body.  Shortness of breath.  Heartburn.  Upset stomach (indigestion).  Feeling like you may vomit (nauseous).  Cold sweats.  Feeling tired.  Sudden light-headedness. How is this treated? A heart attack must be treated as soon as possible. Treatment may include:  Medicines to: ? Break up or dissolve blood clots. ? Thin blood and help prevent blood clots. ? Treat blood pressure. ? Improve blood flow to the heart. ? Reduce pain. ? Reduce cholesterol.  Procedures to widen a blocked artery and keep it open.  Open heart surgery.  Receiving oxygen.  Making your heart strong again (cardiac rehabilitation) through exercise, education, and counseling. Follow these instructions at home: Medicines  Take over-the-counter and prescription medicines only as told by your doctor. You may need to take  medicine: ? To keep your blood from clotting too easily. ? To control blood pressure. ? To lower cholesterol. ? To control heart rhythms.  Do not take these medicines unless your doctor says it is okay: ? NSAIDs, such as ibuprofen. ? Supplements that have vitamin A, vitamin E, or both. ? Hormone replacement therapy that has estrogen with or without progestin. Lifestyle      Do not use any products that have nicotine or tobacco, such as cigarettes, e-cigarettes, and chewing tobacco. If you need help quitting, ask your doctor.  Avoid secondhand smoke.  Exercise regularly. Ask your doctor about a cardiac rehab program.  Eat heart-healthy foods. Your doctor will tell you what foods to eat.  Stay at a healthy weight.  Lower your stress level.  Do not use illegal drugs. Alcohol use  Do not drink alcohol if: ? Your doctor tells you not to drink. ? You are pregnant, may be pregnant, or are planning to become  pregnant.  If you drink alcohol: ? Limit how much you use to:  0-1 drink a day for women.  0-2 drinks a day for men. ? Know how much alcohol is in your drink. In the [place] one drink equals one 12 oz bottle of beer (355 mL), one 5 oz glass of wine (148 mL), or one 1 oz glass of hard liquor (44 mL). General instructions  Work with your doctor to treat other problems you may have, such as diabetes or high blood pressure.  Get screened for depression. Get treatment if needed.  Keep your vaccines up to date. Get the flu shot (influenza vaccine) every year.  Keep all follow-up visits as told by your doctor. This is important. Contact a doctor if:  You feel very sad.  You have trouble doing your daily activities. Get help right away if:  You have sudden, unexplained discomfort in your chest, arms, back, neck, jaw, or upper body.  You have shortness of breath.  You have sudden sweating or clammy skin.  You feel like you may vomit.  You vomit.  You feel tired or  weak.  You get light-headed or dizzy.  You feel your heart beating fast.  You feel your heart skipping beats.  You have blood pressure that is higher than 180/120. These symptoms may be an emergency. Do not wait to see if the symptoms will go away. Get medical help right away. Call your local emergency services (911 in the [place] Do not drive yourself to the hospital. Summary  A heart attack occurs when blood and oxygen supply to the heart is cut off.  Do not take NSAIDs unless your doctor says it is okay.  Do not smoke. Avoid secondhand smoke.  Exercise regularly. Ask your doctor about a cardiac rehab program. This information is not intended to replace advice given to you by your health care provider. Make sure you discuss any questions you have with your health care provider. Document Released: 04/13/2012 Document Revised: 01/24/2019 Document Reviewed: 01/24/2019 Elsevier Patient Education  2020 [employer].

## 2019-08-08 NOTE — Progress Notes (Signed)
[facility] Cardiology  SUBJECTIVE: Patient laying in bed, denies chest pain   Vitals:   08/08/19 0000 08/08/19 0400 08/08/19 0600 08/08/19 0800  BP: 116/76 (!) 131/97 111/80 106/81  Pulse: 78 78 70 (!) 37  Resp:      Temp:    98.1 F (36.7 C)  TempSrc:    Oral  SpO2:    100%  Weight:      Height:         Intake/Output Summary (Last 24 hours) at 08/08/2019 3300 Last data filed at 08/08/2019 0800 Gross per 24 hour  Intake 260 ml  Output 1475 ml  Net -1215 ml      PHYSICAL EXAM  General: Well developed, well nourished, in no acute distress HEENT:  Normocephalic and atramatic Neck:  No JVD.  Lungs: Clear bilaterally to auscultation and percussion. Heart: HRRR . Normal S1 and S2 without gallops or murmurs.  Abdomen: Bowel sounds are positive, abdomen soft and non-tender  Msk:  Back normal, normal gait. Normal strength and tone for age. Extremities: No clubbing, cyanosis or edema.   Neuro: Alert and oriented X 3. Psych:  Good affect, responds appropriately   LABS: Basic Metabolic Panel: Recent Labs    08/07/19 0145 08/07/19 0359 08/08/19 0606  NA 140 138 138  K 3.8 3.9 3.6  CL 104 104 108  CO2 27 26 26   GLUCOSE 146* 130* 106*  BUN 21* 21* 12  CREATININE 1.29* 1.07 0.79  CALCIUM 9.3 8.8* 8.4*  MG 2.2  --  2.0  PHOS 3.6  --  2.8   Liver Function Tests: No results for input(s): AST, ALT, ALKPHOS, BILITOT, PROT, ALBUMIN in the last 72 hours. No results for input(s): LIPASE, AMYLASE in the last 72 hours. CBC: Recent Labs    08/06/19 2133 08/07/19 0359  WBC 13.9* 17.6*  HGB 13.6 13.0  HCT 40.3 38.3*  MCV 83.8 85.3  PLT 352 271   Cardiac Enzymes: Recent Labs    08/07/19 0533  CKTOTAL 4,410*   BNP: Invalid input(s): POCBNP D-Dimer: No results for input(s): DDIMER in the last 72 hours. Hemoglobin A1C: No results for input(s): HGBA1C in the last 72 hours. Fasting Lipid Panel: No results for input(s): CHOL, HDL, LDLCALC, TRIG, CHOLHDL, LDLDIRECT in the  last 72 hours. Thyroid Function Tests: Recent Labs    08/06/19 2347  TSH 0.419   Anemia Panel: Recent Labs    08/07/19 0533  FERRITIN 296    Dg Chest Portable 1 View  Result Date: 08/06/2019 CLINICAL DATA:  Chest pain. EXAM: PORTABLE CHEST 1 VIEW COMPARISON:  Chest x-ray dated May 25, 2015. FINDINGS: The heart size and mediastinal contours are within normal limits. Both lungs are clear. The visualized skeletal structures are unremarkable. IMPRESSION: No active disease. Electronically Signed   By: May 27, 2015 M.D.   On: 08/06/2019 22:01     Echo LVEF 45 to 50%  TELEMETRY: Sinus rhythm:  ASSESSMENT AND PLAN:  Active Problems:   STEMI (ST elevation myocardial infarction) (HCC)   Acute ST elevation myocardial infarction (STEMI) involving left anterior descending (LAD) coronary artery (HCC)    1.  Acute anterolateral ST elevation myocardial infarction 2.  Successful primary PCI with DES proximal LAD 3.  Mild ischemic cardiomyopathy 4.  Polysubstance abuse 4.  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  Positive for COVID-19, asymptomatic  Recommendations  1.  Continue current medical therapy 2.  Continue dual antiplatelet therapy uninterrupted for 1 year  3.  Strongly advised patient to abstain from cocaine and tobacco abuse 4.  May discharge home, follow-up with me in 1 week   Isaias Cowman, MD, PhD, [facility] 08/08/2019 8:29 AM

## 2019-08-08 NOTE — Plan of Care (Signed)
Pt ready for discharge home. Declines chest pain, SOB, N/V. Has ambulated around room this morning and ate full breakfast. Given tylenol for dental pain.

## 2019-08-08 NOTE — Discharge Summary (Addendum)
[facility] [place] NAME: Ricardo Yang    MR#:  [id number]  DATE OF BIRTH:  1976-10-12  DATE OF ADMISSION:  08/06/2019   ADMITTING PHYSICIAN: Lang Snow, NP  DATE OF DISCHARGE: 08/08/2019 12:23 PM  PRIMARY CARE PHYSICIAN: Patient, No Pcp Per   ADMISSION DIAGNOSIS:  Acute ST elevation myocardial infarction (STEMI) involving left anterior descending (LAD) coronary artery (HCC) [I21.02] DISCHARGE DIAGNOSIS:  Active Problems:   STEMI (ST elevation myocardial infarction) ([place])   Acute ST elevation myocardial infarction (STEMI) involving left anterior descending (LAD) coronary artery ([place])  SECONDARY DIAGNOSIS:   Past Medical History:  Diagnosis Date  . Multiple sclerosis ([place])    a. question possible MS  . Obesity   . Polysubstance abuse ([place])    a. tobacco, cocaine, crack, ecstasy, pills, "anything but injections"   HOSPITAL COURSE:   Ricardo is a 43 year old male who presented to the ED with chest pain.  The ED, EKG showed an anteriolateral STEMI.  Code STEMI was called and patient was taken emergently to the cath lab for heart catheterization.  Initial troponin was > 27,000.  He was found to be COVID-19 positive.  He was admitted for further management.  Acute STEMI -Underwent cardiac cath 10/10 with DES to the proximal LAD -Started on dual antiplatelet therapy with aspirin and Brilinta- will need to continue this uninterrupted for 1 year -Started on Lipitor 80 mg daily -Started on lisinopril 2.5 mg daily and metoprolol 25 mg daily this admission -ECHO with EF 45-50% -Patient will need to follow-up with cardiology as an outpatient in 1 week  Acute kidney injury- likely cardiorenal syndrome.  Resolved.  Acute congestive heart failure with mid-range EF -Started on lisinopril and metporlol -Needs to f/u with cardiology in 1 week  History of COVID-19 illness- asymptomatic. Tested positive on 06/28/19. -No treatment  needed  Polysubstance abuse with tobacco and cocaine -Substance cessation counseling provided  DISCHARGE CONDITIONS:  Acute STEMI History of COVID-19  Polysubstance abuse CONSULTS OBTAINED:  Treatment Team:  Tsosie Billing, MD Isaias Cowman, MD DRUG ALLERGIES:  No Known Allergies DISCHARGE MEDICATIONS:   Allergies as of 08/08/2019   No Known Allergies     Medication List    STOP taking these medications   amoxicillin 500 MG capsule Commonly known as: AMOXIL   ibuprofen 600 MG tablet Commonly known as: ADVIL     TAKE these medications   aspirin 81 MG chewable tablet Chew 1 tablet (81 mg total) by mouth daily.   atorvastatin 80 MG tablet Commonly known as: LIPITOR Take 1 tablet (80 mg total) by mouth daily at 6 PM.   lisinopril 2.5 MG tablet Commonly known as: ZESTRIL Take 1 tablet (2.5 mg total) by mouth daily.   metoprolol succinate 25 MG 24 hr tablet Commonly known as: TOPROL-XL Take 1 tablet (25 mg total) by mouth daily. Take with or immediately following a meal.   ticagrelor 90 MG Tabs tablet Commonly known as: BRILINTA Take 1 tablet (90 mg total) by mouth 2 (two) times daily.       DISCHARGE INSTRUCTIONS:  1. F/u with PCP in 5 days 2. F/u with cardiology in 1 week 3. Take aspirin, brilinta, metoprolol, lipitor, and lisinopril as prescribed DIET:  Cardiac diet DISCHARGE CONDITION:  Stable ACTIVITY:  Activity as tolerated OXYGEN:  Home Oxygen: No.  Oxygen Delivery: room air DISCHARGE LOCATION:  home   If you experience worsening of your admission symptoms, develop shortness  of breath, life threatening emergency, suicidal or homicidal thoughts you must seek medical attention immediately by calling 911 or calling your MD immediately  if symptoms less severe.  You Must read complete instructions/literature along with all the possible adverse reactions/side effects for all the Medicines you take and that have been prescribed to  you. Take any new Medicines after you have completely understood and accpet all the possible adverse reactions/side effects.   Please note  You were cared for by a hospitalist during your hospital stay. If you have any questions about your discharge medications or the care you received while you were in the hospital after you are discharged, you can call the unit and asked to speak with the hospitalist on call if the hospitalist that took care of you is not available. Once you are discharged, your primary care physician will handle any further medical issues. Please note that NO REFILLS for any discharge medications will be authorized once you are discharged, as it is imperative that you return to your primary care physician (or establish a relationship with a primary care physician if you do not have one) for your aftercare needs so that they can reassess your need for medications and monitor your lab values.    On the day of Discharge:  VITAL SIGNS:  Blood pressure 112/69, pulse (!) 37, temperature 98.4 F (36.9 C), temperature source Oral, resp. rate 16, height 6\' 4"  (1.93 m), weight 118.7 kg, SpO2 100 %. PHYSICAL EXAMINATION:  GENERAL:  43 y.o.-year-old patient lying in the bed with no acute distress. Well-appearing. EYES: Pupils equal, round, reactive to light and accommodation. No scleral icterus. Extraocular muscles intact.  HEENT: Head atraumatic, normocephalic. Oropharynx and nasopharynx clear.  NECK:  Supple, no jugular venous distention. No thyroid enlargement, no tenderness.  LUNGS: Normal breath sounds bilaterally, no wheezing, rales,rhonchi or crepitation. No use of accessory muscles of respiration.  CARDIOVASCULAR: S1, S2 normal. No murmurs, rubs, or gallops.  ABDOMEN: Soft, non-tender, non-distended. Bowel sounds present. No organomegaly or mass.  EXTREMITIES: No pedal edema, cyanosis, or clubbing.  NEUROLOGIC: Cranial nerves II through XII are intact. Muscle strength 5/5 in all  extremities. Sensation intact. Gait not checked.  PSYCHIATRIC: The patient is alert and oriented x 3.  SKIN: No obvious rash, lesion, or ulcer.  DATA REVIEW:   CBC Recent Labs  Lab 08/07/19 0359  WBC 17.6*  HGB 13.0  HCT 38.3*  PLT 271    Chemistries  Recent Labs  Lab 08/08/19 0606  NA 138  K 3.6  CL 108  CO2 26  GLUCOSE 106*  BUN 12  CREATININE 0.79  CALCIUM 8.4*  MG 2.0     Microbiology Results  Results for orders placed or performed during the hospital encounter of 08/06/19  SARS Coronavirus 2 by RT PCR (hospital order, performed in [facility] hospital lab) Nasopharyngeal Nasopharyngeal Swab     Status: Abnormal   Collection Time: 08/06/19  9:33 PM   Specimen: Nasopharyngeal Swab  Result Value Ref Range Status   SARS Coronavirus 2 POSITIVE (A) NEGATIVE Final    Comment: RESULT CALLED TO, READ BACK BY AND VERIFIED WITH: ANN PRIDE @2304  08/06/2019 TTG  (NOTE) If result is NEGATIVE SARS-CoV-2 target nucleic acids are NOT DETECTED. The SARS-CoV-2 RNA is generally detectable in upper and lower  respiratory specimens during the acute phase of infection. The lowest  concentration of SARS-CoV-2 viral copies this assay can detect is 250  copies / mL. A negative result does not  preclude SARS-CoV-2 infection  and should not be used as the sole basis for treatment or other  patient management decisions.  A negative result may occur with  improper specimen collection / handling, submission of specimen other  than nasopharyngeal swab, presence of viral mutation(s) within the  areas targeted by this assay, and inadequate number of viral copies  (<250 copies / mL). A negative result must be combined with clinical  observations, patient history, and epidemiological information. If result is POSITIVE SARS-CoV-2 target nucleic acids are DETECTED. The S ARS-CoV-2 RNA is generally detectable in upper and lower  respiratory specimens during the acute phase of infection.   Positive  results are indicative of active infection with SARS-CoV-2.  Clinical  correlation with patient history and other diagnostic information is  necessary to determine patient infection status.  Positive results do  not rule out bacterial infection or co-infection with other viruses. If result is PRESUMPTIVE POSTIVE SARS-CoV-2 nucleic acids MAY BE PRESENT.   A presumptive positive result was obtained on the submitted specimen  and confirmed on repeat testing.  While 2019 novel coronavirus  (SARS-CoV-2) nucleic acids may be present in the submitted sample  additional confirmatory testing may be necessary for epidemiological  and / or clinical management purposes  to differentiate between  SARS-CoV-2 and other Sarbecovirus currently known to infect humans.  If clinically indicated additional testing with an alternate test  methodology [phone]) is adv ised. The SARS-CoV-2 RNA is generally  detectable in upper and lower respiratory specimens during the acute  phase of infection. The expected result is Negative. Fact Sheet for Patients:  BoilerBrush.com.cy Fact Sheet for Healthcare Providers: https://pope.com/ This test is not yet approved or cleared by the [place] FDA and has been authorized for detection and/or diagnosis of SARS-CoV-2 by FDA under an Emergency Use Authorization (EUA).  This EUA will remain in effect (meaning this test can be used) for the duration of the COVID-19 declaration under Section 564(b)(1) of the Act, 21 U.S.C. section 360bbb-3(b)(1), unless the authorization is terminated or revoked sooner. Performed at [facility], [address] [place], [place] [address]   MRSA PCR Screening     Status: None   Collection Time: 08/07/19 12:03 AM   Specimen: Nasopharyngeal  Result Value Ref Range Status   MRSA by PCR NEGATIVE NEGATIVE Final    Comment:        The GeneXpert MRSA Assay (FDA approved  for NASAL specimens only), is one component of a comprehensive MRSA colonization surveillance program. It is not intended to diagnose MRSA infection nor to guide or monitor treatment for MRSA infections. Performed at [facility], [address] [place], [place] [address]     RADIOLOGY:  No results found.   Management plans discussed with the patient, family and they are in agreement.  CODE STATUS: Full Code   TOTAL TIME TAKING CARE OF THIS PATIENT: 40 minutes.    Jinny Blossom Andrianna Manalang M.D on 08/08/2019 at 3:02 PM  Between 7am to 6pm - Pager [phone]  After 6pm go to [email or web address] - Social research officer, government  [facility]  Office  [phone]  CC: Primary care physician; Patient, No Pcp Per   Note: This dictation was prepared with Dragon dictation along with smaller phrase technology. Any transcriptional errors that result from this process are unintentional.

## 2019-08-08 NOTE — Progress Notes (Signed)
Discharge instructions and medications reviewed with patient, questions answered. Pt voiced concerns over cost of brilinta, this RN spoke with pharmacy who got a coupon from [place], provided to patient. 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 Pt appreciative of resources. IVs removed, pt's belongings returned. Pt taken out via wheelchair by Holli Humbles, RN without incidence.

## 2021-04-20 ENCOUNTER — Encounter: Payer: Self-pay | Admitting: Emergency Medicine

## 2021-04-20 ENCOUNTER — Inpatient Hospital Stay
Admission: EM | Admit: 2021-04-20 | Discharge: 2021-04-23 | DRG: 282 | Disposition: A | Discharge status: Home / Self Care | Payer: Self-pay | Attending: Internal Medicine | Admitting: Internal Medicine

## 2021-04-20 ENCOUNTER — Other Ambulatory Visit: Payer: Self-pay

## 2021-04-20 ENCOUNTER — Emergency Department: Payer: Self-pay

## 2021-04-20 DIAGNOSIS — E663 Overweight: Secondary | ICD-10-CM | POA: Diagnosis present

## 2021-04-20 DIAGNOSIS — I252 Old myocardial infarction: Secondary | ICD-10-CM

## 2021-04-20 DIAGNOSIS — F1510 Other stimulant abuse, uncomplicated: Secondary | ICD-10-CM | POA: Diagnosis present

## 2021-04-20 DIAGNOSIS — Z7982 Long term (current) use of aspirin: Secondary | ICD-10-CM

## 2021-04-20 DIAGNOSIS — F1210 Cannabis abuse, uncomplicated: Secondary | ICD-10-CM | POA: Diagnosis present

## 2021-04-20 DIAGNOSIS — Z79899 Other long term (current) drug therapy: Secondary | ICD-10-CM

## 2021-04-20 DIAGNOSIS — Z955 Presence of coronary angioplasty implant and graft: Secondary | ICD-10-CM

## 2021-04-20 DIAGNOSIS — F1410 Cocaine abuse, uncomplicated: Secondary | ICD-10-CM | POA: Diagnosis present

## 2021-04-20 DIAGNOSIS — I10 Essential (primary) hypertension: Secondary | ICD-10-CM | POA: Diagnosis present

## 2021-04-20 DIAGNOSIS — I214 Non-ST elevation (NSTEMI) myocardial infarction: Principal | ICD-10-CM | POA: Diagnosis present

## 2021-04-20 DIAGNOSIS — G35 Multiple sclerosis: Secondary | ICD-10-CM | POA: Diagnosis present

## 2021-04-20 DIAGNOSIS — I200 Unstable angina: Secondary | ICD-10-CM

## 2021-04-20 DIAGNOSIS — Z7902 Long term (current) use of antithrombotics/antiplatelets: Secondary | ICD-10-CM

## 2021-04-20 DIAGNOSIS — Z8249 Family history of ischemic heart disease and other diseases of the circulatory system: Secondary | ICD-10-CM

## 2021-04-20 DIAGNOSIS — Z6829 Body mass index (BMI) 29.0-29.9, adult: Secondary | ICD-10-CM

## 2021-04-20 DIAGNOSIS — F17200 Nicotine dependence, unspecified, uncomplicated: Secondary | ICD-10-CM | POA: Diagnosis present

## 2021-04-20 DIAGNOSIS — Z20822 Contact with and (suspected) exposure to covid-19: Secondary | ICD-10-CM | POA: Diagnosis present

## 2021-04-20 DIAGNOSIS — I25110 Atherosclerotic heart disease of native coronary artery with unstable angina pectoris: Secondary | ICD-10-CM | POA: Diagnosis present

## 2021-04-20 LAB — BASIC METABOLIC PANEL
Anion gap: 9 (ref 5–15)
BUN: 12 mg/dL (ref 6–20)
CO2: 26 mmol/L (ref 22–32)
Calcium: 9.5 mg/dL (ref 8.9–10.3)
Chloride: 105 mmol/L (ref 98–111)
Creatinine, Ser: 0.96 mg/dL (ref 0.61–1.24)
GFR, Estimated: >60 mL/min (ref >60)
Glucose, Bld: 123 mg/dL — ABNORMAL HIGH (ref 70–99)
Potassium: 3.6 mmol/L (ref 3.5–5.1)
Sodium: 140 mmol/L (ref 135–145)

## 2021-04-20 LAB — CBC
HCT: 45.7 % (ref 39.0–52.0)
Hemoglobin: 15.4 g/dL (ref 13.0–17.0)
MCH: 29.4 pg (ref 26.0–34.0)
MCHC: 33.7 g/dL (ref 30.0–36.0)
MCV: 87.2 fL (ref 80.0–100.0)
Platelets: 298 10*3/uL (ref 150–400)
RBC: 5.24 MIL/uL (ref 4.22–5.81)
RDW: 13.5 % (ref 11.5–15.5)
WBC: 16.6 10*3/uL — ABNORMAL HIGH (ref 4.0–10.5)
nRBC: 0 % (ref 0.0–0.2)

## 2021-04-20 LAB — PROTIME-INR
INR: 1 (ref 0.8–1.2)
Prothrombin Time: 12.9 seconds (ref 11.4–15.2)

## 2021-04-20 LAB — APTT: aPTT: 80 seconds — ABNORMAL HIGH (ref 24–36)

## 2021-04-20 LAB — RESP PANEL BY RT-PCR (FLU A&B, COVID) ARPGX2
Influenza A by PCR: NEGATIVE
Influenza B by PCR: NEGATIVE
SARS Coronavirus 2 by RT PCR: NEGATIVE

## 2021-04-20 LAB — TROPONIN I (HIGH SENSITIVITY)
Troponin I (High Sensitivity): 5090 ng/L (ref <18)
Troponin I (High Sensitivity): 8789 ng/L (ref <18)

## 2021-04-20 LAB — HEPARIN LEVEL (UNFRACTIONATED): Heparin Unfractionated: 0.3 IU/mL (ref 0.30–0.70)

## 2021-04-20 MED ORDER — ONDANSETRON HCL 4 MG/2ML IJ SOLN: 4.0000 mg | Freq: Four times a day (QID) | INTRAMUSCULAR | Status: DC | PRN

## 2021-04-20 MED ORDER — ASPIRIN 300 MG RE SUPP: 300.0000 mg | RECTAL | Status: AC

## 2021-04-20 MED ORDER — LISINOPRIL 5 MG PO TABS
2.5000 mg | ORAL_TABLET | Freq: Every day | ORAL | Status: DC
  Administered 2021-04-20 – 2021-04-23 (×3): 2.5 mg via ORAL
  Filled 2021-04-20 (×3): qty 1

## 2021-04-20 MED ORDER — HEPARIN (PORCINE) 25000 UT/250ML-% IV SOLN
2050.0000 [IU]/h | INTRAVENOUS | Status: DC
  Administered 2021-04-20 – 2021-04-22 (×3): 1400 [IU]/h via INTRAVENOUS
  Filled 2021-04-20 (×3): qty 250

## 2021-04-20 MED ORDER — ASPIRIN 81 MG PO CHEW: 324.0000 mg | CHEWABLE_TABLET | ORAL | Status: AC

## 2021-04-20 MED ORDER — NITROGLYCERIN 0.4 MG SL SUBL
0.4000 mg | SUBLINGUAL_TABLET | SUBLINGUAL | Status: DC | PRN
  Administered 2021-04-20: 0.4 mg via SUBLINGUAL
  Filled 2021-04-20: qty 1

## 2021-04-20 MED ORDER — ACETAMINOPHEN 325 MG PO TABS
650.0000 mg | ORAL_TABLET | ORAL | Status: DC | PRN
  Administered 2021-04-20: 650 mg via ORAL
  Filled 2021-04-20: qty 2

## 2021-04-20 MED ORDER — SODIUM CHLORIDE 0.9 % IV SOLN: INTRAVENOUS | Status: DC

## 2021-04-20 MED ORDER — HEPARIN SODIUM (PORCINE) 5000 UNIT/ML IJ SOLN
4000.0000 [IU] | Freq: Once | INTRAMUSCULAR | Status: AC
  Administered 2021-04-20: 4000 [IU] via INTRAVENOUS
  Filled 2021-04-20: qty 1

## 2021-04-20 MED ORDER — ASPIRIN EC 81 MG PO TBEC
81.0000 mg | DELAYED_RELEASE_TABLET | Freq: Every day | ORAL | Status: DC
  Administered 2021-04-21 – 2021-04-23 (×2): 81 mg via ORAL
  Filled 2021-04-20 (×2): qty 1

## 2021-04-20 MED ORDER — METOPROLOL TARTRATE 25 MG PO TABS
12.5000 mg | ORAL_TABLET | Freq: Two times a day (BID) | ORAL | Status: DC
  Administered 2021-04-20 – 2021-04-23 (×6): 12.5 mg via ORAL
  Filled 2021-04-20 (×6): qty 1

## 2021-04-20 MED ORDER — ASPIRIN 81 MG PO CHEW
243.0000 mg | CHEWABLE_TABLET | Freq: Once | ORAL | Status: AC
  Administered 2021-04-20: 243 mg via ORAL
  Filled 2021-04-20: qty 3

## 2021-04-20 MED ORDER — NITROGLYCERIN 0.4 MG SL SUBL: 0.4000 mg | SUBLINGUAL_TABLET | SUBLINGUAL | Status: DC | PRN

## 2021-04-20 MED ORDER — ATORVASTATIN CALCIUM 80 MG PO TABS
80.0000 mg | ORAL_TABLET | Freq: Every day | ORAL | Status: DC
  Administered 2021-04-20 – 2021-04-22 (×3): 80 mg via ORAL
  Filled 2021-04-20 (×2): qty 1

## 2021-04-20 NOTE — H&P (Signed)
History and Physical    Ricardo L Reesor IBB:[id number] DOB: 1976/10/20 DOA: 04/20/2021  PCP: Patient, No Pcp Per (Inactive) (Confirm with patient/family/NH records and if not entered, this has to be entered at [facility] point of entry) Patient coming from: home  I have personally briefly reviewed patient's old medical records in [facility]  Chief Complaint: chest pain  HPI: Ricardo Yang is a 45 y.o. male with medical history significant of CAD s/p STEMI October 2020: had cath with PCI DES LAD, Echo at that time with EF 45-50%. He has not had any medical/cardiology follow up. He has not continued medications including BB, ACE or statin. He also stopped ticagrelor at some point.  This AM he experience chest pain that got worse after physical exertion and persisted. He describes a feeling like his heart is being squeezed. Denies N/V, radiation to arm or neck, denies SOB. Due to his persistent chest pain he presents to [facility]ED.   ED Course: T 99, 147/90. EKG w/ NSR w/o ST elevation, question of septal and lateral ischemia. Initial troponin 5,090. Intravenous heparin given and continuous infusion ordered. SL NTG given with some relief. Patient does continue to c/o chest pain rated as 2/3. TRH called to admit patient for NSTEM.  Review of Systems: As per HPI otherwise 10 point review of systems negative.    Past Medical History:  Diagnosis Date   Multiple sclerosis (HCC)    a. question possible MS   Obesity    Polysubstance abuse (HCC)    a. tobacco, cocaine, crack, ecstasy, pills, "anything but injections"    Past Surgical History:  Procedure Laterality Date   CARDIAC CATHETERIZATION N/A 05/28/2015   Procedure: Left Heart Cath and Coronary Angiography;  Surgeon: Antonieta Iba, MD;  Location: [facility] INVASIVE CV LAB;  Service: Cardiovascular;  Laterality: N/A;   CORONARY/GRAFT ACUTE MI REVASCULARIZATION N/A 08/06/2019   Procedure: Coronary/Graft Acute MI Revascularization;  Surgeon:  Marcina Millard, MD;  Location: [facility] INVASIVE CV LAB;  Service: Cardiovascular;  Laterality: N/A;   LEFT HEART CATH AND CORONARY ANGIOGRAPHY N/A 08/06/2019   Procedure: LEFT HEART CATH AND CORONARY ANGIOGRAPHY;  Surgeon: Marcina Millard, MD;  Location: [facility] INVASIVE CV LAB;  Service: Cardiovascular;  Laterality: N/A;    Soc Hx - 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    reports that he has been smoking. He has never used smokeless tobacco. He reports current drug use. Frequency: 1.00 time per week. Drugs: "Crack" cocaine, Amphetamines, Cocaine, Hashish, Marijuana, and MDMA (Ecstacy). He reports that he does not drink alcohol.  No Known Allergies  Family History  Problem Relation Age of Onset   Heart disease Father      Prior to Admission medications   Medication Sig Start Date End Date Taking? Authorizing Provider  aspirin 81 MG chewable tablet Chew 1 tablet (81 mg total) by mouth daily. 08/08/19  Yes Mayo, Allyn Kenner, MD  atorvastatin (LIPITOR) 80 MG tablet Take 1 tablet (80 mg total) by mouth daily at 6 PM. Patient not taking: Reported on 04/20/2021 08/08/19   Mayo, Allyn Kenner, MD  lisinopril (ZESTRIL) 2.5 MG tablet Take 1 tablet (2.5 mg total) by mouth daily. Patient not taking: Reported on 04/20/2021 08/08/19   MayoAllyn Kenner, MD  metoprolol succinate (TOPROL-XL) 25 MG 24 hr tablet Take 1 tablet (25 mg total) by mouth daily. Take with or immediately following a meal. Patient not taking: Reported on 04/20/2021 08/08/19   Willadean Carol  Pollie Friar, MD  ticagrelor (BRILINTA) 90 MG TABS tablet Take 1 tablet (90 mg total) by mouth 2 (two) times daily. Patient not taking: Reported on 04/20/2021 08/08/19   Campbell Stall, MD    Physical Exam: Vitals:   04/20/21 1100 04/20/21 1101 04/20/21 1235  BP:  (!) 147/90 (!) 151/79  Pulse:  89 88  Resp:  20 14  Temp:  99 F (37.2 C) 98.2 F (36.8 C)  TempSrc:  Oral Oral  SpO2:  98% 98%  Weight: 111.1 kg    Height:  6\' 4"  (1.93 m)       Vitals:   04/20/21 1100 04/20/21 1101 04/20/21 1235  BP:  (!) 147/90 (!) 151/79  Pulse:  89 88  Resp:  20 14  Temp:  99 F (37.2 C) 98.2 F (36.8 C)  TempSrc:  Oral Oral  SpO2:  98% 98%  Weight: 111.1 kg    Height: 6\' 4"  (1.93 m)     General:  overweight man in no acute distress Eyes: PERRL, lids and conjunctivae normal ENMT: Mucous membranes are moist. Posterior pharynx clear of any exudate or lesions.Normal dentition.  Neck: normal, supple, no masses, no thyromegaly Respiratory: clear to auscultation bilaterally, no wheezing, no crackles. Normal respiratory effort. No accessory muscle use.  Cardiovascular: Regular rate and rhythm, no murmurs / rubs / gallops. No extremity edema. 2+ pedal pulses. No carotid bruits.  Abdomen: no tenderness, no masses palpated. No hepatosplenomegaly. Bowel sounds positive.  Musculoskeletal: no clubbing / cyanosis. No joint deformity upper and lower extremities. Normal muscle tone.  Skin: no rashes, lesions, ulcers. No induration Neurologic: CN 2-12 grossly intact. Sensation intact, DTR normal. Strength 5/5 in all 4.  Psychiatric: Normal judgment and insight. Alert and oriented x 3. Normal mood.     Labs on Admission: I have personally reviewed following labs and imaging studies  CBC: Recent Labs  Lab 04/20/21 1104  WBC 16.6*  HGB 15.4  HCT 45.7  MCV 87.2  PLT 298   Basic Metabolic Panel: Recent Labs  Lab 04/20/21 1104  NA 140  K 3.6  CL 105  CO2 26  GLUCOSE 123*  BUN 12  CREATININE 0.96  CALCIUM 9.5   GFR: Estimated Creatinine Clearance: 131.2 mL/min (by C-G formula based on SCr of 0.96 mg/dL). Liver Function Tests: No results for input(s): AST, ALT, ALKPHOS, BILITOT, PROT, ALBUMIN in the last 168 hours. No results for input(s): LIPASE, AMYLASE in the last 168 hours. No results for input(s): AMMONIA in the last 168 hours. Coagulation Profile: No results for input(s): INR, PROTIME in the last 168  hours. Cardiac Enzymes: No results for input(s): CKTOTAL, CKMB, CKMBINDEX, TROPONINI in the last 168 hours. BNP (last 3 results) No results for input(s): PROBNP in the last 8760 hours. HbA1C: No results for input(s): HGBA1C in the last 72 hours. CBG: No results for input(s): GLUCAP in the last 168 hours. Lipid Profile: No results for input(s): CHOL, HDL, LDLCALC, TRIG, CHOLHDL, LDLDIRECT in the last 72 hours. Thyroid Function Tests: No results for input(s): TSH, T4TOTAL, FREET4, T3FREE, THYROIDAB in the last 72 hours. Anemia Panel: No results for input(s): VITAMINB12, FOLATE, FERRITIN, TIBC, IRON, RETICCTPCT in the last 72 hours. Urine analysis: No results found for: COLORURINE, APPEARANCEUR, LABSPEC, PHURINE, GLUCOSEU, HGBUR, BILIRUBINUR, KETONESUR, PROTEINUR, UROBILINOGEN, NITRITE, LEUKOCYTESUR  Radiological Exams on Admission: DG Chest 2 View  Result Date: 04/20/2021 CLINICAL DATA:  Chest pain EXAM: CHEST - 2 VIEW COMPARISON:  08/06/2019 FINDINGS: The heart size and mediastinal  contours are within normal limits. Both lungs are clear. The visualized skeletal structures are unremarkable. IMPRESSION: Negative chest. Electronically Signed   By: Marnee Spring M.D.   On: 04/20/2021 11:48    EKG: Independently reviewed. NSR, no acute ST elevation. Question of septal/lateral ischemia  Assessment/Plan Active Problems:   NSTEMI (non-ST elevated myocardial infarction) (HCC)   Heavy smoker    ACS - patient with typical chest pain, elevated troponin and h/o STEM in 07/2019 who is off medications.  Plan Progressive cardia admission  Heparin infusion  NTG prn  Cardiology consult  DVT prophylaxis: full dose heparin  Code Status: full code  Family Communication: patient preferred that we not call family at this time  Disposition Plan: TBD  Consults called: cardiology - Dr. Darrold Junker by secure chat  Admission status: inpatient - cardiac progressive    Illene Regulus MD [facility] Pager [phone]  If 7PM-7AM, please contact night-coverage [email or web address] Password [facility]  04/20/2021, 12:48 PM

## 2021-04-20 NOTE — ED Notes (Signed)
Informed RN bed assigned 

## 2021-04-20 NOTE — Consult Note (Signed)
[facility] Cardiology  CARDIOLOGY CONSULT NOTE  Patient ID: Ricardo Yang MRN: [id number] DOB/AGE: 1975-10-30 45 y.o.  Admit date: 04/20/2021 Referring Physician [place] Primary Physician  Primary Cardiologist  Reason for Consultation non-ST elevation myocardial infarction  HPI: 45 year old gentleman referred for evaluation of non-ST elevation myocardial infarction.  The patient status post anterolateral ST elevation myocardial infarction 08/06/2019 at which time he underwent primary PCI with DES proximal LAD.  The patient has been lost to follow-up, has not taken his medications.  He awoke this morning with substernal chest discomfort.  He was changing his father's tire and developed worsening chest pain.  He was brought to [facility] emergency room where ECG revealed sinus rhythm with evidence of old anteroseptal and lateral MI without acute ischemic ST-T wave changes.  Admission labs were notable for elevated troponin 5090, 8789.  Patient was treated with sublingual nitroglycerin overall improvement of chest pain, rated 2/10.  The patient continues smoke tobacco, marijuana, and does cocaine and drinks alcohol.  Review of systems complete and found to be negative unless listed above     Past Medical History:  Diagnosis Date   Multiple sclerosis (HCC)    a. question possible MS   Obesity    Polysubstance abuse (HCC)    a. tobacco, cocaine, crack, ecstasy, pills, "anything but injections"    Past Surgical History:  Procedure Laterality Date   CARDIAC CATHETERIZATION N/A 05/28/2015   Procedure: Left Heart Cath and Coronary Angiography;  Surgeon: Antonieta Iba, MD;  Location: [facility] INVASIVE CV LAB;  Service: Cardiovascular;  Laterality: N/A;   CORONARY/GRAFT ACUTE MI REVASCULARIZATION N/A 08/06/2019   Procedure: Coronary/Graft Acute MI Revascularization;  Surgeon: Marcina Millard, MD;  Location: [facility] INVASIVE CV LAB;  Service: Cardiovascular;  Laterality: N/A;   LEFT HEART CATH AND CORONARY  ANGIOGRAPHY N/A 08/06/2019   Procedure: LEFT HEART CATH AND CORONARY ANGIOGRAPHY;  Surgeon: Marcina Millard, MD;  Location: [facility] INVASIVE CV LAB;  Service: Cardiovascular;  Laterality: N/A;    (Not in a hospital admission)  Social History   Socioeconomic History   Marital status: Single    Spouse name: Not on file   Number of children: Not on file   Years of education: Not on file   Highest education level: Not on file  Occupational History   Not on file  Tobacco Use   Smoking status: Every Day    Pack years: 0.00   Smokeless tobacco: Never  Substance and Sexual Activity   Alcohol use: No    Alcohol/week: 0.0 standard drinks   Drug use: Yes    Frequency: 1.0 times per week    Types: "Crack" cocaine, Amphetamines, Cocaine, Hashish, Marijuana, MDMA (Ecstacy)   Sexual activity: Yes    Partners: Female  Other Topics Concern   Not on file  Social History Narrative   Not on file   Social Determinants of Health   Financial Resource Strain: Not on file  Food Insecurity: Not on file  Transportation Needs: Not on file  Physical Activity: Not on file  Stress: Not on file  Social Connections: Not on file  Intimate Partner Violence: Not on file    Family History  Problem Relation Age of Onset   Heart disease Father       Review of systems complete and found to be negative unless listed above      PHYSICAL EXAM  General: Well developed, well nourished, in no acute distress HEENT:  Normocephalic and atramatic Neck:  No JVD.  Lungs:  Clear bilaterally to auscultation and percussion. Heart: HRRR . Normal S1 and S2 without gallops or murmurs.  Abdomen: Bowel sounds are positive, abdomen soft and non-tender  Msk:  Back normal, normal gait. Normal strength and tone for age. Extremities: No clubbing, cyanosis or edema.   Neuro: Alert and oriented X 3. Psych:  Good affect, responds appropriately  Labs:   Lab Results  Component Value Date   WBC 16.6 (H) 04/20/2021    HGB 15.4 04/20/2021   HCT 45.7 04/20/2021   MCV 87.2 04/20/2021   PLT 298 04/20/2021    Recent Labs  Lab 04/20/21 1104  NA 140  K 3.6  CL 105  CO2 26  BUN 12  CREATININE 0.96  CALCIUM 9.5  GLUCOSE 123*   Lab Results  Component Value Date   CKTOTAL 4,410 (H) 08/07/2019   TROPONINI 2.09 (H) 05/27/2015    Lab Results  Component Value Date   CHOL 138 05/26/2015   Lab Results  Component Value Date   HDL 31 (L) 05/26/2015   Lab Results  Component Value Date   LDLCALC 85 05/26/2015   Lab Results  Component Value Date   TRIG 109 05/26/2015   Lab Results  Component Value Date   CHOLHDL 4.5 05/26/2015   No results found for: LDLDIRECT    Radiology: DG Chest 2 View  Result Date: 04/20/2021 CLINICAL DATA:  Chest pain EXAM: CHEST - 2 VIEW COMPARISON:  08/06/2019 FINDINGS: The heart size and mediastinal contours are within normal limits. Both lungs are clear. The visualized skeletal structures are unremarkable. IMPRESSION: Negative chest. Electronically Signed   By: Marnee Spring M.D.   On: 04/20/2021 11:48    EKG: Sinus rhythm with old anteroseptal and lateral MI  ASSESSMENT AND PLAN:   1.  Non-ST elevation myocardial infarction with elevated troponin 5090 and 8789, without ischemic ST-T wave changes on ECG, improved after sublingual nitroglycerin and heparin drip 2.  Coronary artery disease, status post anterolateral STEMI 08/06/2019, with primary PCI and drug-eluting stent proximal LAD 3.  Polysubstance abuse  Recommendations  1.  Agree with current therapy 2.  Continue heparin drip 3.  Review 2D echocardiogram 4.  We will likely proceed with cardiac catheterization during this hospitalization  Signed: Marcina Millard MD,PhD, [facility] 04/20/2021, 2:21 PM

## 2021-04-20 NOTE — Progress Notes (Addendum)
ANTICOAGULATION CONSULT NOTE  Pharmacy Consult for heparin infusion Indication:  ACS/STEMI  No Known Allergies  Patient Measurements: Height: 6\' 4"  (193 cm) Weight: 111.1 kg (245 lb) IBW/kg (Calculated) : 86.8 Heparin Dosing Weight: 109.3 kg   Vital Signs: Temp: 98.2 F (36.8 C) (06/25 1235) Temp Source: Oral (06/25 1235) BP: 151/79 (06/25 1235) Pulse Rate: 88 (06/25 1235)  Labs: Recent Labs    04/20/21 1104  HGB 15.4  HCT 45.7  PLT 298  CREATININE 0.96  TROPONINIHS 5,090*    Estimated Creatinine Clearance: 131.2 mL/min (by C-G formula based on SCr of 0.96 mg/dL).   Medical History: Past Medical History:  Diagnosis Date   Multiple sclerosis (HCC)    a. question possible MS   Obesity    Polysubstance abuse (HCC)    a. tobacco, cocaine, crack, ecstasy, pills, "anything but injections"    Medications:  Per chart review and med rec tech confirmed pt not on anticoagulation prior to admission  Assessment: 45 yo male with history of multiple sclerosis and polysubstance abuse who presented to the ED for chest pain. Pt had LHC 07/2019 with acute LAD occlusion requiring DES. HS Trop 5090. Pharmacy has been consulted for heparin dosing and monitoring.   Baseline labs: Hgb 15.4, Hct 45.7, plt 298, aPTT and PT-INR pending  Goal of Therapy:  Heparin level 0.3-0.7 units/ml Monitor platelets by anticoagulation protocol: Yes   Plan:  Give 4000 units bolus x 1 Start heparin infusion at 1400 units/hr Check anti-Xa level in 6 hours and daily while on heparin Continue to monitor H&H and platelets  08/2019, PharmD 04/20/2021,12:43 PM

## 2021-04-20 NOTE — ED Triage Notes (Signed)
Pt reports CP that is tight in nature and constant with intermittent increases in severity. Pt reports some nausea as well

## 2021-04-20 NOTE — ED Provider Notes (Signed)
Emergency Medicine Provider Triage Evaluation Note  Ricardo Yang , a 45 y.o. male  was evaluated in triage.  Pt complains of centralized chest pain with nausea. Hx of 2 MI's with previous stent placement. No current medications for cardiac symptoms. No dizziness, LOC, SOB, fever or other symptoms. Pain worse after changing his father's tire  Review of Systems  Positive: Chest pain Negative: Fever, shortness of breath, cough  Physical Exam  Ht 6\' 4"  (1.93 m)   Wt 111.1 kg   BMI 29.82 kg/m  Gen:   Awake, no distress  Resp:  Normal effort MSK:   Moves extremities without difficulty Other:    Medical Decision Making  Medically screening exam initiated at 11:01 AM.  Appropriate orders placed.  Ricardo Yang was informed that the remainder of the evaluation will be completed by another provider, this initial triage assessment does not replace that evaluation, and the importance of remaining in the ED until their evaluation is complete.    [place], PA 04/20/21 1102    04/22/21, MD 04/20/21 [phone]

## 2021-04-20 NOTE — ED Notes (Addendum)
Patients mother to desk asking why patient is not in room and hooked up to monitor if he is having CP. She reports his CP has worsened. 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 EKG showed to MD Terrilee Files and signed off. Mother was also informed she would need to wait outside due to no visitors in the lobby at this time. She agreed to policy and stepped outside

## 2021-04-20 NOTE — Plan of Care (Signed)

## 2021-04-20 NOTE — ED Provider Notes (Signed)
[facility] Emergency Department Provider Note ____________________________________________   Event Date/Time   First MD Initiated Contact with Patient 04/20/21 1209     (approximate)  I have reviewed the triage vital signs and the nursing notes.  HISTORY  Chief Complaint Chest Pain   HPI Ricardo Yang is a 45 y.o. male who presents to the ED for evaluation of chest pain.   Chart review indicates hx CAD, left heart cath 07/2019 with acute LAD occlusion requiring DES.  Polysubstance abuse.  Patient presents to the ED for evaluation of chest discomfort since awakening this morning.  He reports a mild discomfort this morning that was quite benign and he did not think much of.  He reports helping his father change a flat tire on father's collar, and after coming inside from this, patient reports feeling nauseous with significant chest tightness and pain.  Denies syncopal episodes, shortness of breath, fevers or recent illnesses.  Denies abdominal pain.  Reports continued 6/10 chest tightness. he is only taken 1 baby aspirin today.  Past Medical History:  Diagnosis Date   Multiple sclerosis (HCC)    a. question possible MS   Obesity    Polysubstance abuse (HCC)    a. tobacco, cocaine, crack, ecstasy, pills, "anything but injections"    Patient Active Problem List   Diagnosis Date Noted   STEMI (ST elevation myocardial infarction) (HCC) 08/06/2019   Acute ST elevation myocardial infarction (STEMI) involving left anterior descending (LAD) coronary artery (HCC) 08/06/2019   Chest pain    NSTEMI (non-ST elevated myocardial infarction) (HCC) 05/25/2015   Elevated troponin    Ischemic chest pain (HCC)    Cocaine abuse (HCC)    Heavy smoker     Past Surgical History:  Procedure Laterality Date   CARDIAC CATHETERIZATION N/A 05/28/2015   Procedure: Left Heart Cath and Coronary Angiography;  Surgeon: Antonieta Iba, MD;  Location: [facility] INVASIVE CV LAB;   Service: Cardiovascular;  Laterality: N/A;   CORONARY/GRAFT ACUTE MI REVASCULARIZATION N/A 08/06/2019   Procedure: Coronary/Graft Acute MI Revascularization;  Surgeon: Marcina Millard, MD;  Location: [facility] INVASIVE CV LAB;  Service: Cardiovascular;  Laterality: N/A;   LEFT HEART CATH AND CORONARY ANGIOGRAPHY N/A 08/06/2019   Procedure: LEFT HEART CATH AND CORONARY ANGIOGRAPHY;  Surgeon: Marcina Millard, MD;  Location: [facility] INVASIVE CV LAB;  Service: Cardiovascular;  Laterality: N/A;    Prior to Admission medications   Medication Sig Start Date End Date Taking? Authorizing Provider  aspirin 81 MG chewable tablet Chew 1 tablet (81 mg total) by mouth daily. 08/08/19   Mayo, Allyn Kenner, MD  atorvastatin (LIPITOR) 80 MG tablet Take 1 tablet (80 mg total) by mouth daily at 6 PM. 08/08/19   Mayo, Allyn Kenner, MD  lisinopril (ZESTRIL) 2.5 MG tablet Take 1 tablet (2.5 mg total) by mouth daily. 08/08/19   Mayo, Allyn Kenner, MD  metoprolol succinate (TOPROL-XL) 25 MG 24 hr tablet Take 1 tablet (25 mg total) by mouth daily. Take with or immediately following a meal. 08/08/19   Mayo, Allyn Kenner, MD  ticagrelor (BRILINTA) 90 MG TABS tablet Take 1 tablet (90 mg total) by mouth 2 (two) times daily. 08/08/19   Mayo, Allyn Kenner, MD    Allergies Patient has no known allergies.  Family History  Problem Relation Age of Onset   Heart disease Father     Social History Social History   Tobacco Use   Smoking status: Every Day    Pack years: 0.00  Smokeless tobacco: Never  Substance Use Topics   Alcohol use: No    Alcohol/week: 0.0 standard drinks   Drug use: Yes    Frequency: 1.0 times per week    Types: "Crack" cocaine, Amphetamines, Cocaine, Hashish, Marijuana, MDMA (Ecstacy)    Review of Systems  Constitutional: No fever/chills Eyes: No visual changes. ENT: No sore throat. Cardiovascular: Positive for chest pain. Respiratory: Denies shortness of breath. Gastrointestinal: No abdominal pain.   no vomiting.  No diarrhea.  No constipation. Positive for nausea Genitourinary: Negative for dysuria. Musculoskeletal: Negative for back pain. Skin: Negative for rash. Neurological: Negative for headaches, focal weakness or numbness.  ____________________________________________   PHYSICAL EXAM:  VITAL SIGNS: Vitals:   04/20/21 1101  BP: (!) 147/90  Pulse: 89  Resp: 20  Temp: 99 F (37.2 C)  SpO2: 98%     Constitutional: Alert and oriented. Well appearing and in no acute distress. Eyes: Conjunctivae are normal. PERRL. EOMI. Head: Atraumatic. Nose: No congestion/rhinnorhea. Mouth/Throat: Mucous membranes are moist.  Oropharynx non-erythematous. Neck: No stridor. No cervical spine tenderness to palpation. Cardiovascular: Normal rate, regular rhythm. Grossly normal heart sounds.  Good peripheral circulation. Respiratory: Normal respiratory effort.  No retractions. Lungs CTAB. Gastrointestinal: Soft , nondistended, nontender to palpation. No CVA tenderness. Musculoskeletal: No lower extremity tenderness nor edema.  No joint effusions. No signs of acute trauma. Neurologic:  Normal speech and language. No gross focal neurologic deficits are appreciated. No gait instability noted. Skin:  Skin is warm, dry and intact. No rash noted. Psychiatric: Mood and affect are normal. Speech and behavior are normal.  ____________________________________________   LABS (all labs ordered are listed, but only abnormal results are displayed)  Labs Reviewed  BASIC METABOLIC PANEL - Abnormal; Notable for the following components:      Result Value   Glucose, Bld 123 (*)    All other components within normal limits  CBC - Abnormal; Notable for the following components:   WBC 16.6 (*)    All other components within normal limits  TROPONIN I (HIGH SENSITIVITY) - Abnormal; Notable for the following components:   Troponin I (High Sensitivity) 5,090 (*)    All other components within normal  limits  RESP PANEL BY RT-PCR (FLU A&B, COVID) ARPGX2  APTT  PROTIME-INR   ____________________________________________  12 Lead EKG  Sinus rhythm, rate of 80 bpm.  Rightward axis.  Normal intervals.  Nonspecific lateral and inferior ST changes without STEMI criteria. ____________________________________________  RADIOLOGY  ED MD interpretation: 2 view CXR reviewed by me without evidence of acute cardiopulmonary pathology.  Official radiology report(s): DG Chest 2 View  Result Date: 04/20/2021 CLINICAL DATA:  Chest pain EXAM: CHEST - 2 VIEW COMPARISON:  08/06/2019 FINDINGS: The heart size and mediastinal contours are within normal limits. Both lungs are clear. The visualized skeletal structures are unremarkable. IMPRESSION: Negative chest. Electronically Signed   By: Marnee Spring M.D.   On: 04/20/2021 11:48    ____________________________________________   PROCEDURES and INTERVENTIONS  Procedure(s) performed (including Critical Care):  .1-3 Lead EKG Interpretation  Date/Time: 04/20/2021 12:22 PM Performed by: Delton Prairie, MD Authorized by: Delton Prairie, MD     Interpretation: normal     ECG rate:  86   ECG rate assessment: normal     Rhythm: sinus rhythm     Ectopy: none     Conduction: normal   .Critical Care  Date/Time: 04/20/2021 12:22 PM Performed by: Delton Prairie, MD Authorized by: Delton Prairie, MD   Critical care  provider statement:    Critical care time (minutes):  45   Critical care was necessary to treat or prevent imminent or life-threatening deterioration of the following conditions:  Cardiac failure   Critical care was time spent personally by me on the following activities:  Discussions with consultants, evaluation of patient's response to treatment, examination of patient, ordering and performing treatments and interventions, ordering and review of laboratory studies, ordering and review of radiographic studies, pulse oximetry, re-evaluation of  patient's condition, obtaining history from patient or surrogate and review of old charts  Medications  aspirin chewable tablet 243 mg (has no administration in time range)  nitroGLYCERIN (NITROSTAT) SL tablet 0.4 mg (has no administration in time range)  heparin injection 4,000 Units (4,000 Units Intravenous Given 04/20/21 1215)    ____________________________________________   MDM / ED COURSE   45 year old male with history of CAD presents to the ED with chest pain, with evidence of non-STEMI, requiring heparinization medical admission.  Presents hemodynamically stable.  Ongoing chest pain that we will treat with aspirin and nitroglycerin.  Blood work with elevated troponin over 5000.  We will provide remainder of aspirin dosing for the day.  EKG without STEMI criteria.  CXR without infiltrate or PTX.     ____________________________________________   FINAL CLINICAL IMPRESSION(S) / ED DIAGNOSES  Final diagnoses:  Unstable angina pectoris (HCC)  NSTEMI (non-ST elevated myocardial infarction) [facility])     ED Discharge Orders     None        Damian Buckles   Note:  This document was prepared using Dragon voice recognition software and may include unintentional dictation errors.    Delton Prairie, MD 04/20/21 [phone]

## 2021-04-21 ENCOUNTER — Other Ambulatory Visit: Payer: Self-pay

## 2021-04-21 ENCOUNTER — Inpatient Hospital Stay
Admit: 2021-04-21 | Discharge: 2021-04-21 | Disposition: A | Discharge status: Home / Self Care | Payer: Self-pay | Attending: Internal Medicine | Admitting: Internal Medicine

## 2021-04-21 LAB — CBC
HCT: 42.7 % (ref 39.0–52.0)
Hemoglobin: 14.2 g/dL (ref 13.0–17.0)
MCH: 29.2 pg (ref 26.0–34.0)
MCHC: 33.3 g/dL (ref 30.0–36.0)
MCV: 87.7 fL (ref 80.0–100.0)
Platelets: 267 10*3/uL (ref 150–400)
RBC: 4.87 MIL/uL (ref 4.22–5.81)
RDW: 13.6 % (ref 11.5–15.5)
WBC: 14.8 10*3/uL — ABNORMAL HIGH (ref 4.0–10.5)
nRBC: 0 % (ref 0.0–0.2)

## 2021-04-21 LAB — LIPID PANEL
Cholesterol: 163 mg/dL (ref 0–200)
HDL: 39 mg/dL — ABNORMAL LOW (ref >40)
LDL Cholesterol: 100 mg/dL — ABNORMAL HIGH (ref 0–99)
Total CHOL/HDL Ratio: 4.2 RATIO
Triglycerides: 118 mg/dL (ref <150)
VLDL: 24 mg/dL (ref 0–40)

## 2021-04-21 LAB — BASIC METABOLIC PANEL
Anion gap: 7 (ref 5–15)
BUN: 10 mg/dL (ref 6–20)
CO2: 29 mmol/L (ref 22–32)
Calcium: 8.9 mg/dL (ref 8.9–10.3)
Chloride: 104 mmol/L (ref 98–111)
Creatinine, Ser: 1.05 mg/dL (ref 0.61–1.24)
GFR, Estimated: >60 mL/min (ref >60)
Glucose, Bld: 120 mg/dL — ABNORMAL HIGH (ref 70–99)
Potassium: 3.7 mmol/L (ref 3.5–5.1)
Sodium: 140 mmol/L (ref 135–145)

## 2021-04-21 LAB — ETHANOL: Alcohol, Ethyl (B): <10 mg/dL (ref <10)

## 2021-04-21 LAB — HEPARIN LEVEL (UNFRACTIONATED): Heparin Unfractionated: 0.3 IU/mL (ref 0.30–0.70)

## 2021-04-21 MED ORDER — SODIUM CHLORIDE 0.9 % WEIGHT BASED INFUSION: 1.0000 mL/kg/h | INTRAVENOUS | Status: DC

## 2021-04-21 MED ORDER — SODIUM CHLORIDE 0.9% FLUSH: 3.0000 mL | INTRAVENOUS | Status: DC | PRN

## 2021-04-21 MED ORDER — SODIUM CHLORIDE 0.9 % IV SOLN: 250.0000 mL | INTRAVENOUS | Status: DC | PRN

## 2021-04-21 MED ORDER — ASPIRIN 81 MG PO CHEW
81.0000 mg | CHEWABLE_TABLET | ORAL | Status: AC
  Administered 2021-04-22: 81 mg via ORAL
  Filled 2021-04-21: qty 1

## 2021-04-21 MED ORDER — SODIUM CHLORIDE 0.9 % WEIGHT BASED INFUSION: 3.0000 mL/kg/h | INTRAVENOUS | Status: AC

## 2021-04-21 MED ORDER — SODIUM CHLORIDE 0.9% FLUSH
3.0000 mL | Freq: Two times a day (BID) | INTRAVENOUS | Status: DC
  Administered 2021-04-21 – 2021-04-23 (×3): 3 mL via INTRAVENOUS

## 2021-04-21 NOTE — Progress Notes (Signed)
*  PRELIMINARY RESULTS* Echocardiogram 2D Echocardiogram has been performed.  Ricardo Yang 04/21/2021, 5:00 PM

## 2021-04-21 NOTE — Progress Notes (Signed)
[facility] Cardiology  SUBJECTIVE: Patient laying in bed, denies chest pain or shortness of breath   Vitals:   04/20/21 2014 04/20/21 2358 04/21/21 0459 04/21/21 0735  BP:  (!) 138/98 130/87 (!) 132/97  Pulse:  71 73 68  Resp:  14 14 18   Temp: 98 F (36.7 C)  98.8 F (37.1 C) 98.7 F (37.1 C)  TempSrc:      SpO2:  97% 97% 100%  Weight:      Height:         Intake/Output Summary (Last 24 hours) at 04/21/2021 0946 Last data filed at 04/20/2021 2232 Gross per 24 hour  Intake 480 ml  Output --  Net 480 ml      PHYSICAL EXAM  General: Well developed, well nourished, in no acute distress HEENT:  Normocephalic and atramatic Neck:  No JVD.  Lungs: Clear bilaterally to auscultation and percussion. Heart: HRRR . Normal S1 and S2 without gallops or murmurs.  Abdomen: Bowel sounds are positive, abdomen soft and non-tender  Msk:  Back normal, normal gait. Normal strength and tone for age. Extremities: No clubbing, cyanosis or edema.   Neuro: Alert and oriented X 3. Psych:  Good affect, responds appropriately   LABS: Basic Metabolic Panel: Recent Labs    04/20/21 1104 04/21/21 0205  NA 140 140  K 3.6 3.7  CL 105 104  CO2 26 29  GLUCOSE 123* 120*  BUN 12 10  CREATININE 0.96 1.05  CALCIUM 9.5 8.9   Liver Function Tests: No results for input(s): AST, ALT, ALKPHOS, BILITOT, PROT, ALBUMIN in the last 72 hours. No results for input(s): LIPASE, AMYLASE in the last 72 hours. CBC: Recent Labs    04/20/21 1104 04/21/21 0205  WBC 16.6* 14.8*  HGB 15.4 14.2  HCT 45.7 42.7  MCV 87.2 87.7  PLT 298 267   Cardiac Enzymes: No results for input(s): CKTOTAL, CKMB, CKMBINDEX, TROPONINI in the last 72 hours. BNP: Invalid input(s): POCBNP D-Dimer: No results for input(s): DDIMER in the last 72 hours. Hemoglobin A1C: No results for input(s): HGBA1C in the last 72 hours. Fasting Lipid Panel: Recent Labs    04/21/21 0205  CHOL 163  HDL 39*  LDLCALC 100*  TRIG 118  CHOLHDL 4.2    Thyroid Function Tests: No results for input(s): TSH, T4TOTAL, T3FREE, THYROIDAB in the last 72 hours.  Invalid input(s): FREET3 Anemia Panel: No results for input(s): VITAMINB12, FOLATE, FERRITIN, TIBC, IRON, RETICCTPCT in the last 72 hours.  DG Chest 2 View  Result Date: 04/20/2021 CLINICAL DATA:  Chest pain EXAM: CHEST - 2 VIEW COMPARISON:  08/06/2019 FINDINGS: The heart size and mediastinal contours are within normal limits. Both lungs are clear. The visualized skeletal structures are unremarkable. IMPRESSION: Negative chest. Electronically Signed   By: 10/06/2019 M.D.   On: 04/20/2021 11:48     Echo pending  TELEMETRY: Sinus rhythm 72 bpm:  ASSESSMENT AND PLAN:  Active Problems:   NSTEMI (non-ST elevated myocardial infarction) (HCC)   Heavy smoker    1.  Non-ST elevation myocardial infarction with elevated troponin 5090 and 8789, without ischemic ST-T wave changes on ECG, improved after sublingual nitroglycerin and heparin drip, chest pain resolved 2.  Coronary artery disease, status post anterolateral STEMI 08/06/2019, with primary PCI and drug-eluting stent proximal LAD 3.  Polysubstance abuse   Recommendations   1.  Agree with current therapy 2.  Continue heparin drip 3.  Review 2D echocardiogram 4.  Left heart cardiac catheterization and possible PCI  scheduled for the a.m.  The risk, benefits and alternatives of cardiac catheterization and PCI were explained to the patient and informed written consent was obtained.   Marcina Millard, MD, PhD, [facility] 04/21/2021 9:46 AM

## 2021-04-21 NOTE — Progress Notes (Addendum)
[facility] Progress Note  Patient: Ricardo Yang    FUX:[id number]  DOA: 04/20/2021     Date of Service: the patient was seen and examined on 04/21/2021  Chief Complaint  Patient presents with   Chest Pain   Brief hospital course: Ricardo L Kasler is a 45 y.o. male with medical history significant of CAD s/p STEMI October 2020: had cath with PCI DES LAD, Echo at that time with EF 45-50%. He has not had any medical/cardiology follow up. He has not continued medications including BB, ACE or statin. He also stopped ticagrelor at some point.   This AM he experience chest pain that got worse after physical exertion and persisted. He describes a feeling like his heart is being squeezed. Denies N/V, radiation to arm or neck, denies SOB. Due to his persistent chest pain he presents to [facility].    ED Course: T 99, 147/90. EKG w/ NSR w/o ST elevation, question of septal and lateral ischemia. Initial troponin 5,090. Intravenous heparin given and continuous infusion ordered. SL NTG given with some relief. Patient does continue to c/o chest pain rated as 2/3. TR[facility] called to admit patient for NSTEM.    Assessment and Plan:    NSTEMI, ACS - patient with typical chest pain, elevated troponin and h/o STEM in 07/2019 who is off medications. Continue Heparin infusion Aspirin 81 mg daily, statin 80 mg daily, and NTG prn Cardiology consulted, plan for cardiac cath tomorrow a.m. Keep n.p.o. after midnight   # Hypertension Started lisinopril and metoprolol Monitor BP and titrate medication accordingly   # History of drug abuse, patient used cocaine the night before chest pain Denies alcohol abuse, drinks occasionally, not on CIWA protocol at this time, continue to watch Still smokes marijuana, drug abuse abstinence counseling done. Smoking cessation counseling done Follow UDS    Body mass index is 33.25 kg/m.  Interventions: Patient is trying to lose body weight       Diet: Heart  healthy DVT Prophylaxis: Therapeutic Anticoagulation with heparin IV infusion    Advance goals of care discussion: Full code  Family Communication: family was present at bedside, at the time of interview.  The pt provided permission to discuss medical plan with the family. Opportunity was given to ask question and all questions were answered satisfactorily.   Disposition:  Pt is from home, admitted with chest pain, plan for cardiac cath tomorrow a.m., which precludes a safe discharge. Discharge to home, when needed by cardiology.  Subjective: Patient was admitted overnight due to chest pain which has been resolved.  Patient asymptomatic at this time. Patient agreed for cardiac cath which will be done tomorrow a.m.    Physical Exam: General:  alert oriented to time, place, and person.  Appear in no distress, affect appropriate Eyes: PERRLA ENT: Oral Mucosa Clear, moist  Neck: no JVD,  Cardiovascular: S1 and S2 Present, no Murmur,  Respiratory: good respiratory effort, Bilateral Air entry equal and Decreased, no Crackles, no wheezes Abdomen: Bowel Sound present, Soft and no tenderness,  Skin: no rashes Extremities: no Pedal edema, no calf tenderness Neurologic: without any new focal findings Gait not checked due to patient safety concerns  Vitals:   04/20/21 2358 04/21/21 0459 04/21/21 0735 04/21/21 1128  BP: (!) 138/98 130/87 (!) 132/97 134/90  Pulse: 71 73 68 67  Resp: 14 14 18 18   Temp:  98.8 F (37.1 C) 98.7 F (37.1 C) 98.8 F (37.1 C)  TempSrc:      SpO2:  97% 97% 100% 99%  Weight:      Height:        Intake/Output Summary (Last 24 hours) at 04/21/2021 1430 Last data filed at 04/21/2021 1358 Gross per 24 hour  Intake 720 ml  Output --  Net 720 ml   Filed Weights   04/20/21 1100 04/20/21 2003  Weight: 111.1 kg 120.7 kg    Data Reviewed: I have personally reviewed and interpreted daily labs, tele strips, imagings as discussed above. I reviewed all nursing  notes, pharmacy notes, vitals, pertinent old records I have discussed plan of care as described above with RN and patient/family.  CBC: Recent Labs  Lab 04/20/21 1104 04/21/21 0205  WBC 16.6* 14.8*  HGB 15.4 14.2  HCT 45.7 42.7  MCV 87.2 87.7  PLT 298 267   Basic Metabolic Panel: Recent Labs  Lab 04/20/21 1104 04/21/21 0205  NA 140 140  K 3.6 3.7  CL 105 104  CO2 26 29  GLUCOSE 123* 120*  BUN 12 10  CREATININE 0.96 1.05  CALCIUM 9.5 8.9    Studies: No results found.  Scheduled Meds:  aspirin EC  81 mg Oral Daily   atorvastatin  80 mg Oral q1800   lisinopril  2.5 mg Oral Daily   metoprolol tartrate  12.5 mg Oral BID   sodium chloride flush  3 mL Intravenous Q12H   Continuous Infusions:  sodium chloride 50 mL/hr at 04/20/21 2232   heparin 1,400 Units/hr (04/21/21 0502)   PRN Meds: acetaminophen, nitroGLYCERIN, ondansetron (ZOFRAN) IV  Time spent: 35 minutes  Author: Gillis Yang. MD [facility] 04/21/2021 2:30 PM  To reach On-call, see care teams to locate the attending and reach out to them via [email or web address]. If 7PM-7AM, please contact night-coverage If you still have difficulty reaching the attending provider, please page the [facility] (Director on Call) for [facility] on amion for assistance.

## 2021-04-21 NOTE — Progress Notes (Signed)
ANTICOAGULATION CONSULT NOTE  Pharmacy Consult for heparin infusion Indication:  ACS/STEMI  No Known Allergies  Patient Measurements: Height: 6\' 3"  (190.5 cm) Weight: 120.7 kg (266 lb) IBW/kg (Calculated) : 84.5 Heparin Dosing Weight: 109.3 kg   Vital Signs: Temp: 98 F (36.7 C) (06/25 2014) BP: 138/98 (06/25 2358) Pulse Rate: 71 (06/25 2358)  Labs: Recent Labs    04/20/21 1104 04/20/21 1301 04/20/21 2021 04/21/21 0205  HGB 15.4  --   --  14.2  HCT 45.7  --   --  42.7  PLT 298  --   --  267  APTT  --   --  80*  --   LABPROT  --   --  12.9  --   INR  --   --  1.0  --   HEPARINUNFRC  --   --  0.30 0.30  CREATININE 0.96  --   --  1.05  TROPONINIHS 5,090* 04/23/21*  --   --      Estimated Creatinine Clearance: 123.1 mL/min (by C-G formula based on SCr of 1.05 mg/dL).   Medical History: Past Medical History:  Diagnosis Date   Multiple sclerosis (HCC)    a. question possible MS   Obesity    Polysubstance abuse (HCC)    a. tobacco, cocaine, crack, ecstasy, pills, "anything but injections"    Medications:  Per chart review and med rec tech confirmed pt not on anticoagulation prior to admission  Assessment: 45 yo male with history of multiple sclerosis and polysubstance abuse who presented to the ED for chest pain. Pt had LHC 07/2019 with acute LAD occlusion requiring DES. HS Trop 5090. Pharmacy has been consulted for heparin dosing and monitoring.   Baseline labs: Hgb 15.4, Hct 45.7, plt 298, aPTT and PT-INR pending  Goal of Therapy:  Heparin level 0.3-0.7 units/ml Monitor platelets by anticoagulation protocol: Yes   Plan:  6/26:  HL @ 0205 = 0.3, therapeutic X 2  Will recheck HL on 6/27 with AM labs.   Zaia Carre D, PharmD 04/21/2021,2:48 AM

## 2021-04-22 ENCOUNTER — Encounter: Payer: Self-pay | Admitting: Internal Medicine

## 2021-04-22 ENCOUNTER — Encounter
Admission: EM | Disposition: A | Discharge status: Home / Self Care | Payer: Self-pay | Source: Home / Self Care | Attending: Internal Medicine

## 2021-04-22 DIAGNOSIS — I10 Essential (primary) hypertension: Secondary | ICD-10-CM

## 2021-04-22 HISTORY — PX: LEFT HEART CATH: CATH118248

## 2021-04-22 HISTORY — PX: CORONARY ANGIOGRAPHY: CATH118303

## 2021-04-22 LAB — HEPARIN LEVEL (UNFRACTIONATED)
Heparin Unfractionated: 0.1 IU/mL — ABNORMAL LOW (ref 0.30–0.70)
Heparin Unfractionated: 0.13 IU/mL — ABNORMAL LOW (ref 0.30–0.70)

## 2021-04-22 LAB — BASIC METABOLIC PANEL
Anion gap: 5 (ref 5–15)
BUN: 12 mg/dL (ref 6–20)
CO2: 28 mmol/L (ref 22–32)
Calcium: 8.7 mg/dL — ABNORMAL LOW (ref 8.9–10.3)
Chloride: 104 mmol/L (ref 98–111)
Creatinine, Ser: 0.89 mg/dL (ref 0.61–1.24)
GFR, Estimated: >60 mL/min (ref >60)
Glucose, Bld: 100 mg/dL — ABNORMAL HIGH (ref 70–99)
Potassium: 3.8 mmol/L (ref 3.5–5.1)
Sodium: 137 mmol/L (ref 135–145)

## 2021-04-22 LAB — CBC
HCT: 42.8 % (ref 39.0–52.0)
Hemoglobin: 14.1 g/dL (ref 13.0–17.0)
MCH: 28.6 pg (ref 26.0–34.0)
MCHC: 32.9 g/dL (ref 30.0–36.0)
MCV: 86.8 fL (ref 80.0–100.0)
Platelets: 269 10*3/uL (ref 150–400)
RBC: 4.93 MIL/uL (ref 4.22–5.81)
RDW: 13.5 % (ref 11.5–15.5)
WBC: 16.1 10*3/uL — ABNORMAL HIGH (ref 4.0–10.5)
nRBC: 0 % (ref 0.0–0.2)

## 2021-04-22 LAB — HEMOGLOBIN A1C
Hgb A1c MFr Bld: 5.4 % (ref 4.8–5.6)
Mean Plasma Glucose: 108 mg/dL

## 2021-04-22 LAB — PHOSPHORUS: Phosphorus: 2.4 mg/dL — ABNORMAL LOW (ref 2.5–4.6)

## 2021-04-22 LAB — MAGNESIUM: Magnesium: 2.1 mg/dL (ref 1.7–2.4)

## 2021-04-22 SURGERY — LEFT HEART CATH
Anesthesia: Moderate Sedation

## 2021-04-22 MED ORDER — HEPARIN BOLUS VIA INFUSION
3000.0000 [IU] | Freq: Once | INTRAVENOUS | Status: DC
  Filled 2021-04-22: qty 3000

## 2021-04-22 MED ORDER — HEPARIN BOLUS VIA INFUSION
3000.0000 [IU] | Freq: Once | INTRAVENOUS | Status: AC
  Administered 2021-04-22: 3000 [IU] via INTRAVENOUS
  Filled 2021-04-22: qty 3000

## 2021-04-22 MED ORDER — HEPARIN (PORCINE) IN NACL 1000-0.9 UT/500ML-% IV SOLN
INTRAVENOUS | Status: AC
  Filled 2021-04-22: qty 1000

## 2021-04-22 MED ORDER — HYDRALAZINE HCL 20 MG/ML IJ SOLN: 10.0000 mg | INTRAMUSCULAR | Status: AC | PRN

## 2021-04-22 MED ORDER — HEPARIN SODIUM (PORCINE) 1000 UNIT/ML IJ SOLN
INTRAMUSCULAR | Status: AC
  Filled 2021-04-22: qty 1

## 2021-04-22 MED ORDER — ONDANSETRON HCL 4 MG/2ML IJ SOLN: 4.0000 mg | Freq: Four times a day (QID) | INTRAMUSCULAR | Status: DC | PRN

## 2021-04-22 MED ORDER — FENTANYL CITRATE (PF) 100 MCG/2ML IJ SOLN
INTRAMUSCULAR | Status: AC
  Filled 2021-04-22: qty 2

## 2021-04-22 MED ORDER — SODIUM CHLORIDE 0.9% FLUSH: 3.0000 mL | INTRAVENOUS | Status: DC | PRN

## 2021-04-22 MED ORDER — ASPIRIN EC 325 MG PO TBEC
325.0000 mg | DELAYED_RELEASE_TABLET | Freq: Every day | ORAL | Status: DC
  Administered 2021-04-22: 325 mg via ORAL
  Filled 2021-04-22: qty 1

## 2021-04-22 MED ORDER — MIDAZOLAM HCL 2 MG/2ML IJ SOLN
INTRAMUSCULAR | Status: AC
  Filled 2021-04-22: qty 2

## 2021-04-22 MED ORDER — FENTANYL CITRATE (PF) 100 MCG/2ML IJ SOLN
INTRAMUSCULAR | Status: DC | PRN
  Administered 2021-04-22: 50 ug via INTRAVENOUS

## 2021-04-22 MED ORDER — VERAPAMIL HCL 2.5 MG/ML IV SOLN
INTRAVENOUS | Status: AC
  Filled 2021-04-22: qty 2

## 2021-04-22 MED ORDER — LABETALOL HCL 5 MG/ML IV SOLN: 10.0000 mg | INTRAVENOUS | Status: AC | PRN

## 2021-04-22 MED ORDER — VERAPAMIL HCL 2.5 MG/ML IV SOLN
INTRAVENOUS | Status: DC | PRN
  Administered 2021-04-22: 2.5 mg via INTRA_ARTERIAL

## 2021-04-22 MED ORDER — IOHEXOL 300 MG/ML  SOLN
INTRAMUSCULAR | Status: DC | PRN
  Administered 2021-04-22: 130 mL

## 2021-04-22 MED ORDER — SODIUM CHLORIDE 0.9% FLUSH: 3.0000 mL | Freq: Two times a day (BID) | INTRAVENOUS | Status: DC

## 2021-04-22 MED ORDER — CLOPIDOGREL BISULFATE 75 MG PO TABS
75.0000 mg | ORAL_TABLET | Freq: Every day | ORAL | Status: DC
  Administered 2021-04-23: 75 mg via ORAL
  Filled 2021-04-22: qty 1

## 2021-04-22 MED ORDER — SODIUM CHLORIDE 0.9 % WEIGHT BASED INFUSION
1.0000 mL/kg/h | INTRAVENOUS | Status: AC
  Administered 2021-04-22: 1 mL/kg/h via INTRAVENOUS

## 2021-04-22 MED ORDER — HEPARIN (PORCINE) IN NACL 2000-0.9 UNIT/L-% IV SOLN
INTRAVENOUS | Status: DC | PRN
  Administered 2021-04-22: 1000 mL

## 2021-04-22 MED ORDER — HEPARIN SODIUM (PORCINE) 1000 UNIT/ML IJ SOLN
INTRAMUSCULAR | Status: DC | PRN
  Administered 2021-04-22: 5000 [IU] via INTRAVENOUS

## 2021-04-22 MED ORDER — LIDOCAINE HCL (PF) 1 % IJ SOLN
INTRAMUSCULAR | Status: AC
  Filled 2021-04-22: qty 30

## 2021-04-22 MED ORDER — ENOXAPARIN SODIUM 60 MG/0.6ML IJ SOSY
0.5000 mg/kg | PREFILLED_SYRINGE | INTRAMUSCULAR | Status: DC
  Administered 2021-04-22: 60 mg via SUBCUTANEOUS
  Filled 2021-04-22 (×2): qty 0.6

## 2021-04-22 MED ORDER — SODIUM CHLORIDE 0.9 % IV SOLN: 250.0000 mL | INTRAVENOUS | Status: DC | PRN

## 2021-04-22 MED ORDER — MIDAZOLAM HCL 2 MG/2ML IJ SOLN
INTRAMUSCULAR | Status: DC | PRN
  Administered 2021-04-22: 2 mg via INTRAVENOUS

## 2021-04-22 MED ORDER — ACETAMINOPHEN 325 MG PO TABS: 650.0000 mg | ORAL_TABLET | ORAL | Status: DC | PRN

## 2021-04-22 MED ORDER — LIDOCAINE HCL (PF) 1 % IJ SOLN
INTRAMUSCULAR | Status: DC | PRN
  Administered 2021-04-22: 2 mL

## 2021-04-22 SURGICAL SUPPLY — 15 items
CATH INFINITI 5 FR JL3.5 (CATHETERS) ×3 IMPLANT
CATH INFINITI 5FR ANG PIGTAIL (CATHETERS) ×3 IMPLANT
CATH INFINITI JR4 5F (CATHETERS) ×3 IMPLANT
COVER EZ STRL 42X30 (DRAPES) ×3 IMPLANT
DEVICE RAD COMP TR BAND LRG (VASCULAR PRODUCTS) ×3 IMPLANT
DRAPE BRACHIAL (DRAPES) ×3 IMPLANT
GLIDESHEATH SLEND SS 6F .021 (SHEATH) ×3 IMPLANT
GUIDEWIRE INQWIRE 1.5J.035X260 (WIRE) ×1 IMPLANT
INQWIRE 1.5J .035X260CM (WIRE) ×3
KIT SYRINGE INJ CVI SPIKEX1 (MISCELLANEOUS) ×3 IMPLANT
PACK CARDIAC CATH (CUSTOM PROCEDURE TRAY) ×3 IMPLANT
PROTECTION STATION PRESSURIZED (MISCELLANEOUS) ×3
SET ATX SIMPLICITY (MISCELLANEOUS) ×3 IMPLANT
STATION PROTECTION PRESSURIZED (MISCELLANEOUS) ×1 IMPLANT
TUBING CIL FLEX 10 FLL-RA (TUBING) ×3 IMPLANT

## 2021-04-22 NOTE — Progress Notes (Signed)
Pt went down for cath this AM.   Pt returned to ICU. Right radial dressing intact, no s/s of hematoma.   Pt eating and resting.

## 2021-04-22 NOTE — Progress Notes (Signed)
PROGRESS NOTE    Ricardo Yang  WUJ:[id number] DOB: 1975/11/09 DOA: 04/20/2021 PCP: Patient, No Pcp Per (Inactive)   Chief complaint.  Chest pain. Brief Narrative:  Ricardo Yang is a 45 y.o. male with medical history significant of CAD s/p STEMI October 2020: had cath with PCI DES LAD, Echo at that time with EF 45-50%. He has not had any medical/cardiology follow up. He has not continued medications including BB, ACE or statin. He also stopped ticagrelor at some point. His peak troponin was 8789, EKG did not show any ST elevation.  He was brought to the Cath Lab at 6/27, showed patent LAD stent, 100% stenosis in the second marginal artery.  The decision is made to treat medically.  Assessment & Plan:   Active Problems:   NSTEMI (non-ST elevated myocardial infarction) (HCC)   Heavy smoker  #1.  Non-STEMI. Patient has been seen by cardiology, cardiac cath results as above.  Patient is treated with aspirin, Lipitor. I will keep patient overnight, probably discharge home tomorrow.  2.  Essential hypertension. Continue home medicines.    DVT prophylaxis: Lovenox Code Status: full Family Communication:  Disposition Plan:    Status is: Inpatient  Remains inpatient appropriate because:Inpatient level of care appropriate due to severity of illness  Dispo: The patient is from: Home              Anticipated d/c is to: Home              Patient currently is not medically stable to d/c.   Difficult to place patient No        I/O last 3 completed shifts: In: 2374.9 [P.O.:720; I.V.:1654.9] Out: 500 [Urine:500] Total I/O In: -  Out: 500 [Urine:500]     Consultants:  Cardiology  Procedures: heart cath  Antimicrobials: None  Subjective: Patient feels well today, denies any chest pain or short of breath. No abdominal pain or nausea vomiting. No fever chills No dysuria hematuria.  Objective: Vitals:   04/22/21 1200 04/22/21 1230 04/22/21 1258 04/22/21 1519  BP:  119/82 114/77 108/72 126/72  Pulse: 68 70 75 79  Resp: 16 17 20 18   Temp:   98.4 F (36.9 C) 98.4 F (36.9 C)  TempSrc:      SpO2: 97% 97% 98%   Weight:      Height:        Intake/Output Summary (Last 24 hours) at 04/22/2021 1530 Last data filed at 04/22/2021 1100 Gross per 24 hour  Intake --  Output 500 ml  Net -500 ml   Filed Weights   04/20/21 1100 04/20/21 2003 04/22/21 0953  Weight: 111.1 kg 120.7 kg 120.2 kg    Examination:  General exam: Appears calm and comfortable  Respiratory system: Clear to auscultation. Respiratory effort normal. Cardiovascular system: S1 & S2 heard, RRR. No JVD, murmurs, rubs, gallops or clicks. No pedal edema. Gastrointestinal system: Abdomen is nondistended, soft and nontender. No organomegaly or masses felt. Normal bowel sounds heard. Central nervous system: Alert and oriented. No focal neurological deficits. Extremities: Symmetric 5 x 5 power. Skin: No rashes, lesions or ulcers Psychiatry: Judgement and insight appear normal. Mood & affect appropriate.     Data Reviewed: I have personally reviewed following labs and imaging studies  CBC: Recent Labs  Lab 04/20/21 1104 04/21/21 0205 04/22/21 0511  WBC 16.6* 14.8* 16.1*  HGB 15.4 14.2 14.1  HCT 45.7 42.7 42.8  MCV 87.2 87.7 86.8  PLT 298 267 269  Basic Metabolic Panel: Recent Labs  Lab 04/20/21 1104 04/21/21 0205 04/22/21 0511  NA 140 140 137  K 3.6 3.7 3.8  CL 105 104 104  CO2 26 29 28   GLUCOSE 123* 120* 100*  BUN 12 10 12   CREATININE 0.96 1.05 0.89  CALCIUM 9.5 8.9 8.7*  MG  --   --  2.1  PHOS  --   --  2.4*   GFR: Estimated Creatinine Clearance: 144.9 mL/min (by C-G formula based on SCr of 0.89 mg/dL). Liver Function Tests: No results for input(s): AST, ALT, ALKPHOS, BILITOT, PROT, ALBUMIN in the last 168 hours. No results for input(s): LIPASE, AMYLASE in the last 168 hours. No results for input(s): AMMONIA in the last 168 hours. Coagulation Profile: Recent  Labs  Lab 04/20/21 2021  INR 1.0   Cardiac Enzymes: No results for input(s): CKTOTAL, CKMB, CKMBINDEX, TROPONINI in the last 168 hours. BNP (last 3 results) No results for input(s): PROBNP in the last 8760 hours. HbA1C: Recent Labs    04/20/21 2021  HGBA1C 5.4   CBG: No results for input(s): GLUCAP in the last 168 hours. Lipid Profile: Recent Labs    04/21/21 0205  CHOL 163  HDL 39*  LDLCALC 100*  TRIG 118  CHOLHDL 4.2   Thyroid Function Tests: No results for input(s): TSH, T4TOTAL, FREET4, T3FREE, THYROIDAB in the last 72 hours. Anemia Panel: No results for input(s): VITAMINB12, FOLATE, FERRITIN, TIBC, IRON, RETICCTPCT in the last 72 hours. Sepsis Labs: No results for input(s): PROCALCITON, LATICACIDVEN in the last 168 hours.  Recent Results (from the past 240 hour(s))  Resp Panel by RT-PCR (Flu A&B, Covid) Nasopharyngeal Swab     Status: None   Collection Time: 04/20/21 12:10 PM   Specimen: Nasopharyngeal Swab; Nasopharyngeal(NP) swabs in vial transport medium  Result Value Ref Range Status   SARS Coronavirus 2 by RT PCR NEGATIVE NEGATIVE Final    Comment: (NOTE) SARS-CoV-2 target nucleic acids are NOT DETECTED.  The SARS-CoV-2 RNA is generally detectable in upper respiratory specimens during the acute phase of infection. The lowest concentration of SARS-CoV-2 viral copies this assay can detect is 138 copies/mL. A negative result does not preclude SARS-Cov-2 infection and should not be used as the sole basis for treatment or other patient management decisions. A negative result may occur with  improper specimen collection/handling, submission of specimen other than nasopharyngeal swab, presence of viral mutation(s) within the areas targeted by this assay, and inadequate number of viral copies(<138 copies/mL). A negative result must be combined with clinical observations, patient history, and epidemiological information. The expected result is Negative.  Fact  Sheet for Patients:  04/23/21  Fact Sheet for Healthcare Providers:  04/22/21  This test is no t yet approved or cleared by the [employer] FDA and  has been authorized for detection and/or diagnosis of SARS-CoV-2 by FDA under an Emergency Use Authorization (EUA). This EUA will remain  in effect (meaning this test can be used) for the duration of the COVID-19 declaration under Section 564(b)(1) of the Act, 21 U.S.C.section 360bbb-3(b)(1), unless the authorization is terminated  or revoked sooner.       Influenza A by PCR NEGATIVE NEGATIVE Final   Influenza B by PCR NEGATIVE NEGATIVE Final    Comment: (NOTE) The Xpert Xpress SARS-CoV-2/FLU/RSV plus assay is intended as an aid in the diagnosis of influenza from Nasopharyngeal swab specimens and should not be used as a sole basis for treatment. Nasal washings and aspirates are unacceptable for  Xpert Xpress SARS-CoV-2/FLU/RSV testing.  Fact Sheet for Patients: [employer]Course[employer]com  Fact Sheet for Healthcare Providers: SeriousBroker.it  This test is not yet approved or cleared by the [place] FDA and has been authorized for detection and/or diagnosis of SARS-CoV-2 by FDA under an Emergency Use Authorization (EUA). This EUA will remain in effect (meaning this test can be used) for the duration of the COVID-19 declaration under Section 564(b)(1) of the Act, 21 U.S.C. section 360bbb-3(b)(1), unless the authorization is terminated or revoked.  Performed at [facility], [address] [place], [place] [address]          Radiology Studies: CARDIAC CATHETERIZATION  Result Date: 04/22/2021  Mid RCA lesion is 50% stenosed.  2nd Mrg lesion is 100% stenosed.  Ost LAD to Prox LAD lesion is 20% stenosed.  1.  Non-ST elevation myocardial infarction 2.  Patent stent proximal LAD 3.  Distal occlusion  small to moderate caliber OM 2 4.  Mild to moderate reduced left ventricular function with apical dyskinesis Recommendations 1.  Dual antiplatelet therapy 2.  Continue metoprolol to tartrate and lisinopril 3.  Continue high intensity atorvastatin 4.  May discharge home later today, follow-up in 1 to 2 weeks        Scheduled Meds:  aspirin EC  81 mg Oral Daily   atorvastatin  80 mg Oral q1800   [START ON 04/23/2021] clopidogrel  75 mg Oral Q breakfast   lisinopril  2.5 mg Oral Daily   metoprolol tartrate  12.5 mg Oral BID   sodium chloride flush  3 mL Intravenous Q12H   sodium chloride flush  3 mL Intravenous Q12H   Continuous Infusions:  sodium chloride 50 mL/hr at 04/21/21 2106   sodium chloride     sodium chloride 1 mL/kg/hr (04/22/21 1323)     LOS: 2 days    Time spent: 27 minutes    Marrion Coy, MD [facility]   To contact the attending provider between 7A-7P or the covering provider during after hours 7P-7A, please log into the web site [email or web address] and access using universal [place] password for that web site. If you do not have the password, please call the hospital operator.  04/22/2021, 3:30 PM

## 2021-04-22 NOTE — Progress Notes (Signed)
ANTICOAGULATION CONSULT NOTE  Pharmacy Consult for heparin infusion Indication:  ACS/STEMI  No Known Allergies  Patient Measurements: Height: 6\' 3"  (190.5 cm) Weight: 120.2 kg (265 lb) IBW/kg (Calculated) : 84.5 Heparin Dosing Weight: 109.3 kg   Vital Signs: Temp: 98.4 F (36.9 C) (06/27 1258) Temp Source: Oral (06/27 0732) BP: 108/72 (06/27 1258) Pulse Rate: 75 (06/27 1258)  Labs: Recent Labs    04/20/21 1104 04/20/21 1301 04/20/21 2021 04/20/21 2021 04/21/21 0205 04/22/21 0511 04/22/21 1319  HGB 15.4  --   --   --  14.2 14.1  --   HCT 45.7  --   --   --  42.7 42.8  --   PLT 298  --   --   --  267 269  --   APTT  --   --  80*  --   --   --   --   LABPROT  --   --  12.9  --   --   --   --   INR  --   --  1.0  --   --   --   --   HEPARINUNFRC  --   --  0.30   < > 0.30 0.10* 0.13*  CREATININE 0.96  --   --   --  1.05 0.89  --   TROPONINIHS 5,09006/29/22*  --   --   --   --   --    < > = values in this interval not displayed.     Estimated Creatinine Clearance: 144.9 mL/min (by C-G formula based on SCr of 0.89 mg/dL).   Medical History: Past Medical History:  Diagnosis Date   Multiple sclerosis (HCC)    a. question possible MS   Obesity    Polysubstance abuse (HCC)    a. tobacco, cocaine, crack, ecstasy, pills, "anything but injections"    Medications: NKDA PTA: No AC. Pt on ASA 81mg  QD and reports no longer taking Brilinta. Inpatient: +heparin gtt. - ASA 81mg  +Plavix 75mg   Heparin Dosing Weight: 109.3 kg   Assessment: 45 yo male with history of STEMI 07/2019 with PCI DES to prox-LAD, multiple sclerosis and polysubstance abuse who presented to the ED for chest pain/NSTEMI trops . Pt had LHC 07/2019 with acute LAD occlusion requiring DES. HS Trop 5090. Pharmacy has been consulted for heparin dosing and monitoring.   6/27: planning cardiac cath today.  Date Time aPTT/HL Rate/Comment 6/25 2021 0.3  1400 un/hr 6/26 0205 0.3  1400 un/hr 6/27 0511   0.1  1400>1700 un/hr 6/27 1319 0.13  (Heparin stopped 0952 cath lab)   Baseline Labs: aPTT - collected after gtt started INR - 1.0 Hgb - 15.4>14.1 Plts - 298>269 Trop: 8789  Goal of Therapy:  Heparin level 0.3-0.7 units/ml Monitor platelets by anticoagulation protocol: Yes   Plan:  Will manage now with DAPT post-cath today. ASA325mg  x1; then 81mg  QD in addition to Plavix as outlined in Cardiology consult note. Heparin gtt held [phone] 6/27 for cath. Not resumed on floor. Confirmed with specials and floor RN. Will d/c orders, consult, and labs.   Thank you for the opportunity for pharmacy to be involved in the care of this patient. Pharmacy will sign-off at this time.  7/27, PharmD 04/22/2021,1:47 PM

## 2021-04-22 NOTE — Progress Notes (Signed)
ANTICOAGULATION CONSULT NOTE  Pharmacy Consult for heparin infusion Indication:  ACS/STEMI  No Known Allergies  Patient Measurements: Height: 6\' 3"  (190.5 cm) Weight: 120.7 kg (266 lb) IBW/kg (Calculated) : 84.5 Heparin Dosing Weight: 109.3 kg   Vital Signs: Temp: 98.7 F (37.1 C) (06/27 0459) Temp Source: Oral (06/27 0023) BP: 101/62 (06/27 0459) Pulse Rate: 75 (06/27 0459)  Labs: Recent Labs    04/20/21 1104 04/20/21 1301 04/20/21 2021 04/21/21 0205 04/22/21 0511  HGB 15.4  --   --  14.2 14.1  HCT 45.7  --   --  42.7 42.8  PLT 298  --   --  267 269  APTT  --   --  80*  --   --   LABPROT  --   --  12.9  --   --   INR  --   --  1.0  --   --   HEPARINUNFRC  --   --  0.30 0.30 0.10*  CREATININE 0.96  --   --  1.05 0.89  TROPONINIHS 5,090* 04/24/21*  --   --   --      Estimated Creatinine Clearance: 145.2 mL/min (by C-G formula based on SCr of 0.89 mg/dL).   Medical History: Past Medical History:  Diagnosis Date   Multiple sclerosis (HCC)    a. question possible MS   Obesity    Polysubstance abuse (HCC)    a. tobacco, cocaine, crack, ecstasy, pills, "anything but injections"    Medications:  Per chart review and med rec tech confirmed pt not on anticoagulation prior to admission  Assessment: 45 yo male with history of multiple sclerosis and polysubstance abuse who presented to the ED for chest pain. Pt had LHC 07/2019 with acute LAD occlusion requiring DES. HS Trop 5090. Pharmacy has been consulted for heparin dosing and monitoring.   Baseline labs: Hgb 15.4, Hct 45.7, plt 298, aPTT and PT-INR pending  Goal of Therapy:  Heparin level 0.3-0.7 units/ml Monitor platelets by anticoagulation protocol: Yes   Plan:  6/27:  HL @ 0511 = 0.1 Will heparin 3000 units IV X 1 bolus and increase drip rate to 1700 units/hr. Will recheck HL 6 hrs after rate change.   Evalise Abruzzese D, PharmD 04/22/2021,6:54 AM

## 2021-04-22 NOTE — Progress Notes (Signed)
PHARMACIST - PHYSICIAN COMMUNICATION  CONCERNING:  Enoxaparin (Lovenox) for DVT Prophylaxis    RECOMMENDATION: Patient was prescribed enoxaparin 40mg  q24 hours for VTE prophylaxis.   Filed Weights   04/20/21 1100 04/20/21 2003 04/22/21 0953  Weight: 111.1 kg (245 lb) 120.7 kg (266 lb) 120.2 kg (265 lb)    Body mass index is 33.12 kg/m.  Estimated Creatinine Clearance: 144.9 mL/min (by C-G formula based on SCr of 0.89 mg/dL).   Based on [facility] policy patient is candidate for enoxaparin 0.5mg /kg TBW SQ every 24 hours based on BMI being >30.  DESCRIPTION: Pharmacy has adjusted enoxaparin dose per [facility] policy.  Patient is now receiving enoxaparin 60 mg every 24 hours    [facility] 04/22/2021 3:43 PM

## 2021-04-22 NOTE — Plan of Care (Signed)

## 2021-04-23 ENCOUNTER — Other Ambulatory Visit: Payer: Self-pay

## 2021-04-23 LAB — CBC
HCT: 43.8 % (ref 39.0–52.0)
Hemoglobin: 14.8 g/dL (ref 13.0–17.0)
MCH: 28.9 pg (ref 26.0–34.0)
MCHC: 33.8 g/dL (ref 30.0–36.0)
MCV: 85.5 fL (ref 80.0–100.0)
Platelets: 255 10*3/uL (ref 150–400)
RBC: 5.12 MIL/uL (ref 4.22–5.81)
RDW: 13.2 % (ref 11.5–15.5)
WBC: 11.3 10*3/uL — ABNORMAL HIGH (ref 4.0–10.5)
nRBC: 0 % (ref 0.0–0.2)

## 2021-04-23 LAB — ECHOCARDIOGRAM COMPLETE
AR max vel: 2.84 cm2
AV Peak grad: 2.7 mmHg
Ao pk vel: 0.81 m/s
Area-P 1/2: 3.83 cm2
Height: 75 in
S' Lateral: 5.4 cm
Weight: 4256 oz

## 2021-04-23 LAB — BASIC METABOLIC PANEL
Anion gap: 7 (ref 5–15)
BUN: 12 mg/dL (ref 6–20)
CO2: 26 mmol/L (ref 22–32)
Calcium: 8.9 mg/dL (ref 8.9–10.3)
Chloride: 105 mmol/L (ref 98–111)
Creatinine, Ser: 0.77 mg/dL (ref 0.61–1.24)
GFR, Estimated: >60 mL/min (ref >60)
Glucose, Bld: 96 mg/dL (ref 70–99)
Potassium: 3.7 mmol/L (ref 3.5–5.1)
Sodium: 138 mmol/L (ref 135–145)

## 2021-04-23 LAB — HIV ANTIBODY (ROUTINE TESTING W REFLEX): HIV Screen 4th Generation wRfx: NONREACTIVE

## 2021-04-23 LAB — PHOSPHORUS: Phosphorus: 3 mg/dL (ref 2.5–4.6)

## 2021-04-23 LAB — MAGNESIUM: Magnesium: 2.1 mg/dL (ref 1.7–2.4)

## 2021-04-23 MED ORDER — ATORVASTATIN CALCIUM 80 MG PO TABS
80.0000 mg | ORAL_TABLET | Freq: Every day | ORAL | 0 refills | Status: DC
  Filled 2021-04-23: qty 30, 30d supply, fill #0

## 2021-04-23 MED ORDER — METOPROLOL SUCCINATE ER 25 MG PO TB24
25.0000 mg | ORAL_TABLET | Freq: Every day | ORAL | 0 refills | Status: DC
  Filled 2021-04-23: qty 30, 30d supply, fill #0

## 2021-04-23 MED ORDER — CLOPIDOGREL BISULFATE 75 MG PO TABS
75.0000 mg | ORAL_TABLET | Freq: Every day | ORAL | 0 refills | Status: DC
Start: 2021-04-24 — End: 2022-10-23
  Filled 2021-04-23: qty 30, 30d supply, fill #0

## 2021-04-23 MED ORDER — LISINOPRIL 5 MG PO TABS
2.5000 mg | ORAL_TABLET | Freq: Every day | ORAL | 0 refills | Status: DC
  Filled 2021-04-23: qty 15, 30d supply, fill #0

## 2021-04-23 MED ORDER — METOPROLOL SUCCINATE ER 25 MG PO TB24: 25.0000 mg | ORAL_TABLET | Freq: Every day | ORAL | 0 refills | Status: DC

## 2021-04-23 MED ORDER — ATORVASTATIN CALCIUM 80 MG PO TABS: 80.0000 mg | ORAL_TABLET | Freq: Every day | ORAL | 0 refills | Status: DC

## 2021-04-23 MED ORDER — LISINOPRIL 2.5 MG PO TABS: 2.5000 mg | ORAL_TABLET | Freq: Every day | ORAL | 0 refills | Status: DC

## 2021-04-23 MED ORDER — CLOPIDOGREL BISULFATE 75 MG PO TABS: 75.0000 mg | ORAL_TABLET | Freq: Every day | ORAL | 0 refills | Status: DC

## 2021-04-23 NOTE — Progress Notes (Signed)
Patient discharged today, requested meds be sent to med management. Patient has discharged by the time CSW has attempted to see, called patient no answer and not able to lvm. Called patient's mother, lvm with name and number for her to call back should patient experience questions/concerns with getting his medications.   [place], [place] [phone]

## 2021-04-23 NOTE — Discharge Summary (Signed)
Physician Discharge Summary  Patient ID: Ricardo Yang MRN: [id number] DOB/AGE: 47-Jul-1977 47 y.o.  Admit date: 04/20/2021 Discharge date: 04/23/2021  Admission Diagnoses:  Discharge Diagnoses:  Active Problems:   NSTEMI (non-ST elevated myocardial infarction) [facility])   Heavy smoker   Essential hypertension   Discharged Condition: good  Hospital Course:  Ricardo L Kines is a 45 y.o. male with medical history significant of CAD s/p STEMI October 2020: had cath with PCI DES LAD, Echo at that time with EF 45-50%. He has not had any medical/cardiology follow up. He has not continued medications including BB, ACE or statin. He also stopped ticagrelor at some point. His peak troponin was 8789, EKG did not show any ST elevation.  He was brought to the Cath Lab at 6/27, showed patent LAD stent, 100% stenosis in the second marginal artery.  The decision is made to treat medically.  #1.  Non-STEMI. Patient has been seen by cardiology, cardiac cath results as above.  Patient is started on dual antiplatelet treatment and Lipitor.  He is also treated with metoprolol and lisinopril.  He will follow-up with cardiology in 2 weeks.  2.  Essential hypertension. Metoprolol and lisinopril.    Consults: cardiology  Significant Diagnostic Studies:  Heart cath: Mid RCA lesion is 50% stenosed. 2nd Mrg lesion is 100% stenosed. Ost LAD to Prox LAD lesion is 20% stenosed.   1.  Non-ST elevation myocardial infarction 2.  Patent stent proximal LAD 3.  Distal occlusion small to moderate caliber OM 2 4.  Mild to moderate reduced left ventricular function with apical dyskinesis   Recommendations   1.  Dual antiplatelet therapy 2.  Continue metoprolol to tartrate and lisinopril 3.  Continue high intensity atorvastatin 4.  May discharge home later today, follow-up in 1 to 2 weeks   Treatments: Heart cath, aspirin, plavix  Discharge Exam: Blood pressure 126/80, pulse 72, temperature 97.9 F (36.6 C),  resp. rate 18, height 6\' 3"  (1.905 m), weight 121.7 kg, SpO2 97 %. General appearance: alert and cooperative Resp: clear to auscultation bilaterally Cardio: regular rate and rhythm, S1, S2 normal, no murmur, click, rub or gallop GI: soft, non-tender; bowel sounds normal; no masses,  no organomegaly Extremities: extremities normal, atraumatic, no cyanosis or edema  Disposition: Discharge disposition: 01-Home or Self Care       Discharge Instructions     AMB Referral to Cardiac Rehabilitation - Phase II   Complete by: As directed    Diagnosis: NSTEMI   After initial evaluation and assessments completed: Virtual Based Care may be provided alone or in conjunction with Phase 2 Cardiac Rehab based on patient barriers.: Yes   Diet - low sodium heart healthy   Complete by: As directed    Increase activity slowly   Complete by: As directed       Allergies as of 04/23/2021   No Known Allergies      Medication List     STOP taking these medications    ticagrelor 90 MG Tabs tablet Commonly known as: BRILINTA       TAKE these medications    aspirin 81 MG chewable tablet Chew 1 tablet (81 mg total) by mouth daily.   atorvastatin 80 MG tablet Commonly known as: LIPITOR Take 1 tablet (80 mg total) by mouth daily at 6 PM.   clopidogrel 75 MG tablet Commonly known as: PLAVIX Take 1 tablet (75 mg total) by mouth daily with breakfast. Start taking on: April 24, 2021  lisinopril 2.5 MG tablet Commonly known as: ZESTRIL Take 1 tablet (2.5 mg total) by mouth daily.   metoprolol succinate 25 MG 24 hr tablet Commonly known as: TOPROL-XL Take 1 tablet (25 mg total) by mouth daily. Take with or immediately following a meal.        Follow-up Information     Paraschos, Alexander, MD Follow up in 2 week(s).   Specialty: Cardiology Contact information: [address] [facility] [place] [address] [phone]                  Signed: Marrion Coy 04/23/2021, 10:15 AM

## 2021-05-23 ENCOUNTER — Other Ambulatory Visit: Payer: Self-pay

## 2021-11-19 ENCOUNTER — Telehealth: Payer: Self-pay | Admitting: Pharmacy Technician

## 2021-11-19 NOTE — Telephone Encounter (Signed)
Patient failed to provide 2023 proof of income.  No additional medication assistance will be provided by [facility] without the required proof of income documentation.  Patient notified by letter.  [place], [place]  [id number]  November 18, 2021    Ricardo Yang [address] [place], [place]  [id number]  Dear Ricardo:  This is to inform you that you are no longer eligible to receive medication assistance at [facility].  The reason(s) are:    _____Your total gross monthly household income exceeds 300% of the Federal Poverty Level.   _____Tangible assets (savings, checking, stocks/bonds, pension, retirement, etc.) exceeds our limit  _____You are eligible to receive benefits from [facility], [facility] or HIV Medication              Assistance Program _____You are eligible to receive benefits from a Medicare Part D plan _____You have prescription insurance  _____You are not an [facility] resident __X__Failure to provide all requested documentation (proof of income for 2023, and/or Patient Intake Application, DOH Attestation, Contract, etc).    Medication assistance will resume once all requested documentation has been returned to our clinic.  If you have questions, please contact our clinic at [phone].    Thank you,  [facility]

## 2021-11-30 ENCOUNTER — Emergency Department: Payer: Self-pay

## 2021-11-30 ENCOUNTER — Emergency Department
Admission: EM | Admit: 2021-11-30 | Discharge: 2021-11-30 | Disposition: A | Discharge status: Home / Self Care | Payer: Self-pay | Attending: Emergency Medicine | Admitting: Emergency Medicine

## 2021-11-30 ENCOUNTER — Other Ambulatory Visit: Payer: Self-pay

## 2021-11-30 DIAGNOSIS — Y92009 Unspecified place in unspecified non-institutional (private) residence as the place of occurrence of the external cause: Secondary | ICD-10-CM | POA: Insufficient documentation

## 2021-11-30 DIAGNOSIS — Y9301 Activity, walking, marching and hiking: Secondary | ICD-10-CM | POA: Insufficient documentation

## 2021-11-30 DIAGNOSIS — W19XXXA Unspecified fall, initial encounter: Secondary | ICD-10-CM | POA: Insufficient documentation

## 2021-11-30 DIAGNOSIS — M25421 Effusion, right elbow: Secondary | ICD-10-CM | POA: Insufficient documentation

## 2021-11-30 DIAGNOSIS — S52124A Nondisplaced fracture of head of right radius, initial encounter for closed fracture: Secondary | ICD-10-CM | POA: Insufficient documentation

## 2021-11-30 DIAGNOSIS — M25429 Effusion, unspecified elbow: Secondary | ICD-10-CM

## 2021-11-30 DIAGNOSIS — I10 Essential (primary) hypertension: Secondary | ICD-10-CM | POA: Insufficient documentation

## 2021-11-30 MED ORDER — TRAMADOL HCL 50 MG PO TABS: 50.0000 mg | ORAL_TABLET | Freq: Four times a day (QID) | ORAL | 0 refills | Status: DC | PRN

## 2021-11-30 MED ORDER — NAPROXEN 500 MG PO TABS: 500.0000 mg | ORAL_TABLET | Freq: Two times a day (BID) | ORAL | 0 refills | Status: DC

## 2021-11-30 NOTE — ED Provider Notes (Signed)
[facility] Provider Note    Event Date/Time   First MD Initiated Contact with Patient 11/30/21 1333     (approximate)   History   Elbow Pain   HPI  [place] L Yang is a 46 y.o. male with history of NSTEMI, cocaine use, hypertension and as listed in EMR presents to the emergency department for treatment and evaluation of right elbow pain after mechanical, nonsyncopal fall while walking to a friend's house to watch ballgame.  Initially, elbow did not hurt but over a couple hours it became stiff and difficult to completely straighten.  No previous elbow fracture or injury..      Physical Exam   Triage Vital Signs: ED Triage Vitals  Enc Vitals Group     BP 11/30/21 1328 (!) 144/96     Pulse Rate 11/30/21 1328 83     Resp 11/30/21 1328 16     Temp 11/30/21 1328 98.3 F (36.8 C)     Temp Source 11/30/21 1328 Oral     SpO2 11/30/21 1328 97 %     Weight 11/30/21 1326 260 lb (117.9 kg)     Height 11/30/21 1326 6\' 3"  (1.905 m)     Head Circumference --      Peak Flow --      Pain Score 11/30/21 1326 7     Pain Loc --      Pain Edu? --      Excl. in GC? --     Most recent vital signs: Vitals:   11/30/21 1328  BP: (!) 144/96  Pulse: 83  Resp: 16  Temp: 98.3 F (36.8 C)  SpO2: 97%    General: Awake, no distress.  CV:  Good peripheral perfusion.  Resp:  Normal effort.  Abd:  No distention.  Other:  Right elbow swelling.  Unable to fully extend.  Radial pulse 2+.   ED Results / Procedures / Treatments   Labs (all labs ordered are listed, but only abnormal results are displayed) Labs Reviewed - No data to display   EKG    RADIOLOGY  Image and radiology report reviewed by me.  Image of the right elbow shows a subtle radial head fracture with a small joint effusion.  PROCEDURES:  Critical Care performed: No  Procedures   MEDICATIONS ORDERED IN ED: Medications - No data to display   IMPRESSION / MDM / ASSESSMENT AND PLAN /  ED COURSE  I reviewed the triage vital signs and the nursing notes.                              Differential diagnosis includes, but is not limited to, elbow strain, contusion, radial head fracture  46 year old male presenting to the emergency department for treatment and evaluation of right elbow pain after mechanical, nonsyncopal fall.  See HPI for further details.  Image and exam are consistent.  He has a subtle radial head fracture.  He will be treated with sling immobilization and encouraged to call and schedule follow-up appointment with orthopedics.  He was advised to return to the emergency department for symptoms of change or worsen if he is unable to schedule appointment.      FINAL CLINICAL IMPRESSION(S) / ED DIAGNOSES   Final diagnoses:  Closed nondisplaced fracture of head of right radius, initial encounter  Traumatic effusion of elbow joint     Rx / DC Orders   ED Discharge Orders  Ordered    naproxen (NAPROSYN) 500 MG tablet  2 times daily with meals        11/30/21 1447    traMADol (ULTRAM) 50 MG tablet  Every 6 hours PRN        11/30/21 1447             Note:  This document was prepared using Dragon voice recognition software and may include unintentional dictation errors.   Chinita Pester, FNP 12/01/21 1235    Jene Every, MD 12/01/21 1247

## 2021-11-30 NOTE — Discharge Instructions (Signed)
Follow up with orthopedics.  Return to the ER for symptoms of concern if unable to schedule an appointment.

## 2021-11-30 NOTE — ED Triage Notes (Signed)
Pt comes pov after falling on right elbow. States hard to move arm/swelling since then.

## 2021-12-04 ENCOUNTER — Other Ambulatory Visit: Payer: Self-pay

## 2022-10-04 ENCOUNTER — Emergency Department: Payer: Medicaid Other

## 2022-10-04 ENCOUNTER — Inpatient Hospital Stay: Payer: Medicaid Other

## 2022-10-04 ENCOUNTER — Inpatient Hospital Stay
Admission: EM | Admit: 2022-10-04 | Discharge: 2022-10-09 | DRG: 065 | Disposition: A | Discharge status: Rehabilitation Facility | Payer: Medicaid Other | Attending: Internal Medicine | Admitting: Internal Medicine

## 2022-10-04 DIAGNOSIS — J9811 Atelectasis: Secondary | ICD-10-CM | POA: Diagnosis present

## 2022-10-04 DIAGNOSIS — Z8249 Family history of ischemic heart disease and other diseases of the circulatory system: Secondary | ICD-10-CM

## 2022-10-04 DIAGNOSIS — Z7902 Long term (current) use of antithrombotics/antiplatelets: Secondary | ICD-10-CM | POA: Diagnosis not present

## 2022-10-04 DIAGNOSIS — G8191 Hemiplegia, unspecified affecting right dominant side: Secondary | ICD-10-CM | POA: Diagnosis present

## 2022-10-04 DIAGNOSIS — R29717 NIHSS score 17: Secondary | ICD-10-CM | POA: Diagnosis present

## 2022-10-04 DIAGNOSIS — I110 Hypertensive heart disease with heart failure: Secondary | ICD-10-CM | POA: Diagnosis present

## 2022-10-04 DIAGNOSIS — Z79899 Other long term (current) drug therapy: Secondary | ICD-10-CM | POA: Diagnosis not present

## 2022-10-04 DIAGNOSIS — I2510 Atherosclerotic heart disease of native coronary artery without angina pectoris: Secondary | ICD-10-CM | POA: Diagnosis present

## 2022-10-04 DIAGNOSIS — I5020 Unspecified systolic (congestive) heart failure: Secondary | ICD-10-CM | POA: Diagnosis not present

## 2022-10-04 DIAGNOSIS — F17200 Nicotine dependence, unspecified, uncomplicated: Secondary | ICD-10-CM | POA: Diagnosis present

## 2022-10-04 DIAGNOSIS — I5022 Chronic systolic (congestive) heart failure: Secondary | ICD-10-CM | POA: Diagnosis present

## 2022-10-04 DIAGNOSIS — G35 Multiple sclerosis: Secondary | ICD-10-CM | POA: Diagnosis present

## 2022-10-04 DIAGNOSIS — I6389 Other cerebral infarction: Secondary | ICD-10-CM | POA: Diagnosis not present

## 2022-10-04 DIAGNOSIS — R4701 Aphasia: Secondary | ICD-10-CM | POA: Diagnosis present

## 2022-10-04 DIAGNOSIS — I639 Cerebral infarction, unspecified: Secondary | ICD-10-CM

## 2022-10-04 DIAGNOSIS — F1910 Other psychoactive substance abuse, uncomplicated: Secondary | ICD-10-CM | POA: Diagnosis present

## 2022-10-04 DIAGNOSIS — Z7982 Long term (current) use of aspirin: Secondary | ICD-10-CM

## 2022-10-04 DIAGNOSIS — Z955 Presence of coronary angioplasty implant and graft: Secondary | ICD-10-CM

## 2022-10-04 DIAGNOSIS — Z8673 Personal history of transient ischemic attack (TIA), and cerebral infarction without residual deficits: Secondary | ICD-10-CM | POA: Diagnosis not present

## 2022-10-04 DIAGNOSIS — I252 Old myocardial infarction: Secondary | ICD-10-CM

## 2022-10-04 DIAGNOSIS — F1490 Cocaine use, unspecified, uncomplicated: Secondary | ICD-10-CM

## 2022-10-04 DIAGNOSIS — I10 Essential (primary) hypertension: Secondary | ICD-10-CM | POA: Diagnosis present

## 2022-10-04 DIAGNOSIS — I63512 Cerebral infarction due to unspecified occlusion or stenosis of left middle cerebral artery: Secondary | ICD-10-CM | POA: Diagnosis present

## 2022-10-04 DIAGNOSIS — G4733 Obstructive sleep apnea (adult) (pediatric): Secondary | ICD-10-CM | POA: Diagnosis present

## 2022-10-04 DIAGNOSIS — Z1152 Encounter for screening for COVID-19: Secondary | ICD-10-CM | POA: Diagnosis not present

## 2022-10-04 DIAGNOSIS — F1410 Cocaine abuse, uncomplicated: Secondary | ICD-10-CM | POA: Diagnosis present

## 2022-10-04 DIAGNOSIS — I340 Nonrheumatic mitral (valve) insufficiency: Secondary | ICD-10-CM | POA: Diagnosis not present

## 2022-10-04 DIAGNOSIS — I255 Ischemic cardiomyopathy: Secondary | ICD-10-CM | POA: Diagnosis present

## 2022-10-04 DIAGNOSIS — Z0189 Encounter for other specified special examinations: Secondary | ICD-10-CM | POA: Diagnosis not present

## 2022-10-04 DIAGNOSIS — Z9861 Coronary angioplasty status: Secondary | ICD-10-CM | POA: Diagnosis not present

## 2022-10-04 DIAGNOSIS — E669 Obesity, unspecified: Secondary | ICD-10-CM | POA: Diagnosis present

## 2022-10-04 DIAGNOSIS — Z6830 Body mass index (BMI) 30.0-30.9, adult: Secondary | ICD-10-CM

## 2022-10-04 DIAGNOSIS — I361 Nonrheumatic tricuspid (valve) insufficiency: Secondary | ICD-10-CM | POA: Diagnosis not present

## 2022-10-04 HISTORY — DX: Atherosclerotic heart disease of native coronary artery without angina pectoris: I25.10

## 2022-10-04 HISTORY — DX: Cerebral infarction, unspecified: I63.9

## 2022-10-04 LAB — CBC
HCT: 46.5 % (ref 39.0–52.0)
Hemoglobin: 15 g/dL (ref 13.0–17.0)
MCH: 28.2 pg (ref 26.0–34.0)
MCHC: 32.3 g/dL (ref 30.0–36.0)
MCV: 87.4 fL (ref 80.0–100.0)
Platelets: 255 10*3/uL (ref 150–400)
RBC: 5.32 MIL/uL (ref 4.22–5.81)
RDW: 13.7 % (ref 11.5–15.5)
WBC: 8.7 10*3/uL (ref 4.0–10.5)
nRBC: 0 % (ref 0.0–0.2)

## 2022-10-04 LAB — COMPREHENSIVE METABOLIC PANEL
ALT: 14 U/L (ref 0–44)
AST: 20 U/L (ref 15–41)
Albumin: 3.8 g/dL (ref 3.5–5.0)
Alkaline Phosphatase: 45 U/L (ref 38–126)
Anion gap: 8 (ref 5–15)
BUN: 13 mg/dL (ref 6–20)
CO2: 25 mmol/L (ref 22–32)
Calcium: 9.1 mg/dL (ref 8.9–10.3)
Chloride: 109 mmol/L (ref 98–111)
Creatinine, Ser: 1.1 mg/dL (ref 0.61–1.24)
GFR, Estimated: >60 mL/min (ref >60)
Glucose, Bld: 99 mg/dL (ref 70–99)
Potassium: 3.6 mmol/L (ref 3.5–5.1)
Sodium: 142 mmol/L (ref 135–145)
Total Bilirubin: 0.7 mg/dL (ref 0.3–1.2)
Total Protein: 7.1 g/dL (ref 6.5–8.1)

## 2022-10-04 LAB — URINE DRUG SCREEN, QUALITATIVE (ARMC ONLY)
Amphetamines, Ur Screen: NOT DETECTED
Barbiturates, Ur Screen: NOT DETECTED
Benzodiazepine, Ur Scrn: NOT DETECTED
Cannabinoid 50 Ng, Ur ~~LOC~~: POSITIVE — AB
Cocaine Metabolite,Ur ~~LOC~~: POSITIVE — AB
MDMA (Ecstasy)Ur Screen: NOT DETECTED
Methadone Scn, Ur: NOT DETECTED
Opiate, Ur Screen: NOT DETECTED
Phencyclidine (PCP) Ur S: NOT DETECTED
Tricyclic, Ur Screen: NOT DETECTED

## 2022-10-04 LAB — URINALYSIS, ROUTINE W REFLEX MICROSCOPIC
Bilirubin Urine: NEGATIVE
Glucose, UA: NEGATIVE mg/dL
Hgb urine dipstick: NEGATIVE
Ketones, ur: 5 mg/dL — AB
Leukocytes,Ua: NEGATIVE
Nitrite: NEGATIVE
Protein, ur: NEGATIVE mg/dL
Specific Gravity, Urine: >1.046 — ABNORMAL HIGH (ref 1.005–1.030)
pH: 7 (ref 5.0–8.0)

## 2022-10-04 LAB — ETHANOL: Alcohol, Ethyl (B): <10 mg/dL (ref <10)

## 2022-10-04 LAB — RESP PANEL BY RT-PCR (RSV, FLU A&B, COVID)  RVPGX2
Influenza A by PCR: NEGATIVE
Influenza B by PCR: NEGATIVE
Resp Syncytial Virus by PCR: NEGATIVE
SARS Coronavirus 2 by RT PCR: NEGATIVE

## 2022-10-04 LAB — DIFFERENTIAL
Abs Immature Granulocytes: 0.02 10*3/uL (ref 0.00–0.07)
Basophils Absolute: 0 10*3/uL (ref 0.0–0.1)
Basophils Relative: 0 %
Eosinophils Absolute: 0 10*3/uL (ref 0.0–0.5)
Eosinophils Relative: 1 %
Immature Granulocytes: 0 %
Lymphocytes Relative: 16 %
Lymphs Abs: 1.4 10*3/uL (ref 0.7–4.0)
Monocytes Absolute: 0.4 10*3/uL (ref 0.1–1.0)
Monocytes Relative: 5 %
Neutro Abs: 6.8 10*3/uL (ref 1.7–7.7)
Neutrophils Relative %: 78 %

## 2022-10-04 LAB — PROTIME-INR
INR: 1.1 (ref 0.8–1.2)
Prothrombin Time: 13.6 seconds (ref 11.4–15.2)

## 2022-10-04 LAB — HIV ANTIBODY (ROUTINE TESTING W REFLEX): HIV Screen 4th Generation wRfx: NONREACTIVE

## 2022-10-04 LAB — APTT: aPTT: 39 seconds — ABNORMAL HIGH (ref 24–36)

## 2022-10-04 LAB — CBG MONITORING, ED: Glucose-Capillary: 108 mg/dL — ABNORMAL HIGH (ref 70–99)

## 2022-10-04 MED ORDER — ASPIRIN 300 MG RE SUPP
300.0000 mg | Freq: Once | RECTAL | Status: AC
  Administered 2022-10-04: 300 mg via RECTAL
  Filled 2022-10-04: qty 1

## 2022-10-04 MED ORDER — ATORVASTATIN CALCIUM 80 MG PO TABS
80.0000 mg | ORAL_TABLET | Freq: Every day | ORAL | Status: DC
  Administered 2022-10-04 – 2022-10-08 (×5): 80 mg via ORAL
  Filled 2022-10-04 (×5): qty 1

## 2022-10-04 MED ORDER — ACETAMINOPHEN 160 MG/5ML PO SOLN: 650.0000 mg | ORAL | Status: DC | PRN

## 2022-10-04 MED ORDER — ASPIRIN 300 MG RE SUPP: 300.0000 mg | Freq: Every day | RECTAL | Status: DC

## 2022-10-04 MED ORDER — SODIUM CHLORIDE 0.9% FLUSH
3.0000 mL | Freq: Once | INTRAVENOUS | Status: AC
  Administered 2022-10-04: 3 mL via INTRAVENOUS

## 2022-10-04 MED ORDER — ACETAMINOPHEN 650 MG RE SUPP: 650.0000 mg | RECTAL | Status: DC | PRN

## 2022-10-04 MED ORDER — IOHEXOL 350 MG/ML SOLN
100.0000 mL | Freq: Once | INTRAVENOUS | Status: AC | PRN
  Administered 2022-10-04: 100 mL via INTRAVENOUS

## 2022-10-04 MED ORDER — SODIUM CHLORIDE 0.9 % IV SOLN: INTRAVENOUS | Status: AC

## 2022-10-04 MED ORDER — CLOPIDOGREL BISULFATE 75 MG PO TABS
75.0000 mg | ORAL_TABLET | Freq: Every day | ORAL | Status: DC
  Administered 2022-10-05 – 2022-10-09 (×4): 75 mg via ORAL
  Filled 2022-10-04 (×4): qty 1

## 2022-10-04 MED ORDER — ASPIRIN 81 MG PO CHEW: 81.0000 mg | CHEWABLE_TABLET | Freq: Every day | ORAL | Status: DC

## 2022-10-04 MED ORDER — STROKE: EARLY STAGES OF RECOVERY BOOK: Freq: Once | Status: AC

## 2022-10-04 MED ORDER — ACETAMINOPHEN 325 MG PO TABS: 650.0000 mg | ORAL_TABLET | ORAL | Status: DC | PRN

## 2022-10-04 NOTE — ED Notes (Signed)
RN spoke with MRI and made aware pt cannot answer MRI screening questions

## 2022-10-04 NOTE — Progress Notes (Signed)
Code Stroke Timeline  0430 Code stroke cart activated, pt in CT 0432 Pt returned from CT, EDP at bedside LKW 12/8 2200: MRS 1 0434 Neuro TSMD paged 0437 Neuro TSMD on camera NIHSS 15 0450 Pt left for CT 0514 Pt returned from CT 0517 Neuro TSMD notified images available for viewing. 0520 responded.  1898 Neuro TSMD notified of CTA/Perfusion results. 4210 Neuro TSMD acknowledged results and stated will call EDP to discuss tx recs.  3128 TSRN update primary RN and is off camera  -[place]

## 2022-10-04 NOTE — ED Notes (Signed)
Pt given water with ThickenUp at nectar consistency per diet order.

## 2022-10-04 NOTE — Evaluation (Signed)
Clinical/Bedside Swallow Evaluation Patient Details  Name: Ricardo Yang MRN: [id number] Date of Birth: January 24, 1976  Today's Date: 10/04/2022 Time: SLP Start Time (ACUTE ONLY): 1220 SLP Stop Time (ACUTE ONLY): 1304 SLP Time Calculation (min) (ACUTE ONLY): 44 min  Past Medical History:  Past Medical History:  Diagnosis Date   Multiple sclerosis (HCC)    a. question possible MS   Obesity    Polysubstance abuse (HCC)    a. tobacco, cocaine, crack, ecstasy, pills, "anything but injections"   Past Surgical History:  Past Surgical History:  Procedure Laterality Date   CARDIAC CATHETERIZATION N/A 05/28/2015   Procedure: Left Heart Cath and Coronary Angiography;  Surgeon: Antonieta Iba, MD;  Location: [facility] INVASIVE CV LAB;  Service: Cardiovascular;  Laterality: N/A;   CORONARY ANGIOGRAPHY N/A 04/22/2021   Procedure: CORONARY ANGIOGRAPHY;  Surgeon: Marcina Millard, MD;  Location: [facility] INVASIVE CV LAB;  Service: Cardiovascular;  Laterality: N/A;   CORONARY/GRAFT ACUTE MI REVASCULARIZATION N/A 08/06/2019   Procedure: Coronary/Graft Acute MI Revascularization;  Surgeon: Marcina Millard, MD;  Location: [facility] INVASIVE CV LAB;  Service: Cardiovascular;  Laterality: N/A;   LEFT HEART CATH N/A 04/22/2021   Procedure: Left Heart Cath;  Surgeon: Marcina Millard, MD;  Location: [facility] INVASIVE CV LAB;  Service: Cardiovascular;  Laterality: N/A;   LEFT HEART CATH AND CORONARY ANGIOGRAPHY N/A 08/06/2019   Procedure: LEFT HEART CATH AND CORONARY ANGIOGRAPHY;  Surgeon: Marcina Millard, MD;  Location: [facility] INVASIVE CV LAB;  Service: Cardiovascular;  Laterality: N/A;   HPI:  Per admitting H&P "Most of the history was obtained from EMR.  Patient is aphasic and unable to provide any history  HPI: Ricardo Yang is a 46 y.o. male with medical history significant for prior CVA with no deficits, coronary artery disease with complications of ischemic cardiomyopathy with last known LVEF of 25 to 30% from  a 2D echocardiogram which was done 06/23, hypertension and nicotine dependence who presented to the ER via EMS as a code stroke.  Patient was said to have gone to bed around 10 PM and was at his baseline.  His girlfriend woke up at around 4 AM and had a loud thud and found the patient on the floor.  Patient was unable to move the right side of his body and could not speak.  He was brought in emergently to the ER as a code stroke.  Teleneurology was consulted and patient deemed not a candidate for IV thrombolytics as he was outside the window.  CT angiogram showed no LVO and so no indication for acute NIR.  During my evaluation he is able to move his left upper and lower extremity but has right sided hemiparesis and remains aphasic but able to follow simple commands.  I am unable to do a review of systems on this patient due to his underlying aphasia  CT scan of the head without contrast shows leftward gaze, but no acute cortically based infarct or intracranial hemorrhage identified. And subtle if any left MCA vessel asymmetry.  ASPECTS 10.  CT angiogram of the head and neck shows sub optimal although adequate contrast bolus.  Strong evidence on CTP of a small posterior Left MCA territory infarct core (approximately 12 mL). CTP also suggests approximately 60 mL of oligemia in the Left MCA territory. However, there is NO large vessel occlusion, and no discrete MCA branch occlusion detected on CTA. No atherosclerosis, stenosis, or arterial abnormality identified  in the head or neck. There is a left coronary artery  stent visible in the upper chest. Mild upper lung atelectasis and small volume retained secretions in the trachea.  Twelve-lead EKG reviewed by me shows sinus rhythm with Q waves in the lateral leads    Assessment / Plan / Recommendation  Clinical Impression  Bedside swallow eval was limited secondary to Pt being taken to MRI. Pt was alert and cooperative, fiance was present and supportive. pt positioned  fully right in bed with assistance from Nsg. Oral mech exam revealed seceral missing teeth. Pt was able to open his mouth but not able to follow any other oral motor commands even with visual cues. Pt toelrated thin by tsp as well as ice chips but did present with coughing after sips of thin by cup. Pt tolerated 2 small bites of applesauce well. Given a small piece of graham cracker, Pt was able to masticate and swallow but did present with anterior right leakage and right sided oral residue after the swalow. He was unaware of the residue. No further boluses given as transport for MRI was ready. Recommend Dysphagia 2 chopped with nectar thick liquids with strict aspiration precautions. Meds crushed in applesauce. ST to follow up with toleration of diet and speech and language eval to determine the extent of Aphasia and or Apraxia. Educated Pt's fiance in the room during the exam. SLP Visit Diagnosis: Dysphagia, oropharyngeal phase (R13.12)    Aspiration Risk  Moderate aspiration risk    Diet Recommendation Dysphagia 2 (Fine chop);Nectar-thick liquid   Medication Administration: Crushed with puree Supervision: Full supervision/cueing for compensatory strategies Compensations: Minimize environmental distractions;Slow rate;Small sips/bites;Lingual sweep for clearance of pocketing Postural Changes: Seated upright at 90 degrees;Remain upright for at least 30 minutes after po intake    Other  Recommendations Oral Care Recommendations: Oral care QID    Recommendations for follow up therapy are one component of a multi-disciplinary discharge planning process, led by the attending physician.  Recommendations may be updated based on patient status, additional functional criteria and insurance authorization.  Follow up Recommendations Acute inpatient rehab (3hours/day)      Assistance Recommended at Discharge  Needs full supervision. PT and OT evals pending   Functional Status Assessment Patient has had  a recent decline in their functional status and demonstrates the ability to make significant improvements in function in a reasonable and predictable amount of time.  Frequency and Duration min 5x/week  2 weeks       Prognosis Prognosis for Safe Diet Advancement: Fair Barriers to Reach Goals: Language deficits      Swallow Study   General Date of Onset: 10/04/22 HPI: Per admitting H&P "Most of the history was obtained from EMR.  Patient is aphasic and unable to provide any history  HPI: Ricardo Yang is a 46 y.o. male with medical history significant for prior CVA with no deficits, coronary artery disease with complications of ischemic cardiomyopathy with last known LVEF of 25 to 30% from a 2D echocardiogram which was done 06/23, hypertension and nicotine dependence who presented to the ER via EMS as a code stroke.  Patient was said to have gone to bed around 10 PM and was at his baseline.  His girlfriend woke up at around 4 AM and had a loud thud and found the patient on the floor.  Patient was unable to move the right side of his body and could not speak.  He was brought in emergently to the ER as a code stroke.  Teleneurology was consulted and patient deemed  not a candidate for IV thrombolytics as he was outside the window.  CT angiogram showed no LVO and so no indication for acute NIR.  During my evaluation he is able to move his left upper and lower extremity but has right sided hemiparesis and remains aphasic but able to follow simple commands.  I am unable to do a review of systems on this patient due to his underlying aphasia  CT scan of the head without contrast shows leftward gaze, but no acute cortically based infarct or intracranial hemorrhage identified. And subtle if any left MCA vessel asymmetry.  ASPECTS 10.  CT angiogram of the head and neck shows sub optimal although adequate contrast bolus.  Strong evidence on CTP of a small posterior Left MCA territory infarct core (approximately 12  mL). CTP also suggests approximately 60 mL of oligemia in the Left MCA territory. However, there is NO large vessel occlusion, and no discrete MCA branch occlusion detected on CTA. No atherosclerosis, stenosis, or arterial abnormality identified  in the head or neck. There is a left coronary artery stent visible in the upper chest. Mild upper lung atelectasis and small volume retained secretions in the trachea.  Twelve-lead EKG reviewed by me shows sinus rhythm with Q waves in the lateral leads Type of Study: Bedside Swallow Evaluation Diet Prior to this Study: NPO Temperature Spikes Noted: No Respiratory Status: Room air History of Recent Intubation: No Behavior/Cognition: Alert;Cooperative;Pleasant mood (nonverbal. Difficulty following some directions) Oral Cavity Assessment: Within Functional Limits Oral Care Completed by SLP: No Oral Cavity - Dentition: Poor condition;Missing dentition Vision: Functional for self-feeding Self-Feeding Abilities: Needs assist Patient Positioning: Upright in bed Baseline Vocal Quality: Aphonic Volitional Cough: Cognitively unable to elicit    Oral/Motor/Sensory Function Overall Oral Motor/Sensory Function: Moderate impairment Facial ROM: Reduced right Facial Symmetry: Abnormal symmetry right   Ice Chips Ice chips: Within functional limits Presentation: Spoon   Thin Liquid Thin Liquid: Impaired Presentation: Cup;Spoon Pharyngeal  Phase Impairments: Cough - Immediate    Nectar Thick Nectar Thick Liquid: Not tested   Honey Thick Honey Thick Liquid: Not tested   Puree Puree: Within functional limits Presentation: Spoon   Solid     Solid: Impaired Presentation: Self Fed Oral Phase Impairments: Reduced labial seal;Poor awareness of bolus;Impaired mastication;Reduced lingual movement/coordination Oral Phase Functional Implications: Right anterior spillage      Eather Colas 10/04/2022,1:05 PM

## 2022-10-04 NOTE — H&P (Signed)
History and Physical    Patient: Ricardo Yang HUT:[id number] DOB: 02/14/1976 DOA: 10/04/2022 DOS: the patient was seen and examined on 10/04/2022 PCP: Patient, No Pcp Per  Patient coming from: Home  Chief Complaint:  Chief Complaint  Patient presents with   Code Stroke    LKN 10pm yesterday. At 350 am Girlfriend states heard a thud to floor, found patient in the floor. Non verbal, and unable to move R side with global aphasia. Last CVA 2 years ago with no deficit    Most of the history was obtained from EMR.  Patient is aphasic and unable to provide any history HPI: Ricardo Yang is a 46 y.o. male with medical history significant for prior CVA with no deficits, coronary artery disease with complications of ischemic cardiomyopathy with last known LVEF of 25 to 30% from a 2D echocardiogram which was done 06/23, hypertension and nicotine dependence who presented to the ER via EMS as a code stroke. Patient was said to have gone to bed around 10 PM and was at his baseline.  His girlfriend woke up at around 4 AM and had a loud thud and found the patient on the floor.  Patient was unable to move the right side of his body and could not speak. He was brought in emergently to the ER as a code stroke. Teleneurology was consulted and patient deemed not a candidate for IV thrombolytics as he was outside the window.  CT angiogram showed no LVO and so no indication for acute NIR. During my evaluation he is able to move his left upper and lower extremity but has right sided hemiparesis and remains aphasic but able to follow simple commands. I am unable to do a review of systems on this patient due to his underlying aphasia CT scan of the head without contrast shows leftward gaze, but no acute cortically based infarct or intracranial hemorrhage identified. And subtle if any left MCA vessel asymmetry. ASPECTS 10. CT angiogram of the head and neck shows sub optimal although adequate contrast bolus. Strong  evidence on CTP of a small posterior Left MCA territory infarct core (approximately 12 mL). CTP also suggests approximately 60 mL of oligemia in the Left MCA territory. However, there is NO large vessel occlusion, and no discrete MCA branch occlusion detected on CTA. No atherosclerosis, stenosis, or arterial abnormality identified in the head or neck. There is a left coronary artery stent visible in the upper chest. Mild upper lung atelectasis and small volume retained secretions in the trachea. Twelve-lead EKG reviewed by me shows sinus rhythm with Q waves in the lateral leads    Review of Systems: As mentioned in the history of present illness. All other systems reviewed and are negative. Past Medical History:  Diagnosis Date   Multiple sclerosis (HCC)    a. question possible MS   Obesity    Polysubstance abuse (HCC)    a. tobacco, cocaine, crack, ecstasy, pills, "anything but injections"   Past Surgical History:  Procedure Laterality Date   CARDIAC CATHETERIZATION N/A 05/28/2015   Procedure: Left Heart Cath and Coronary Angiography;  Surgeon: Antonieta Iba, MD;  Location: [facility] INVASIVE CV [facility];  Service: Cardiovascular;  Laterality: N/A;   CORONARY ANGIOGRAPHY N/A 04/22/2021   Procedure: CORONARY ANGIOGRAPHY;  Surgeon: Marcina Millard, MD;  Location: [facility] INVASIVE CV LAB;  Service: Cardiovascular;  Laterality: N/A;   CORONARY/GRAFT ACUTE MI REVASCULARIZATION N/A 08/06/2019   Procedure: Coronary/Graft Acute MI Revascularization;  Surgeon: Marcina Millard, MD;  Location: [facility]VASIVE CV LAB;  Service: Cardiovascular;  Laterality: N/A;   LEFT HEART CATH N/A 04/22/2021   Procedure: Left Heart Cath;  Surgeon: Marcina Millard, MD;  Location: [facility] INV[facility] CV [facility];  Service: Cardiovascular;  Laterality: N/A;   LEFT HEART CATH AND CORONARY ANGIOGRAPHY N/A 08/06/2019   Procedure: LEFT HEART CATH AND CORONARY ANGIOGRAPHY;  Surgeon: Marcina Millard, MD;  Location: [facility] INVASIVE CV  LAB;  Service: Cardiovascular;  Laterality: N/A;   Social History:  reports that he has been smoking. He has never used smokeless tobacco. He reports current drug use. Frequency: 1.00 time per week. Drugs: "Crack" cocaine, Amphetamines, Cocaine, Hashish, Marijuana, and MDMA (Ecstacy). He reports that he does not drink alcohol.  No Known Allergies  Family History  Problem Relation Age of Onset   Heart disease Father     Prior to Admission medications   Medication Sig Start Date End Date Taking? Authorizing Provider  aspirin 81 MG chewable tablet Chew 1 tablet (81 mg total) by mouth daily. 08/08/19   Mayo, Allyn Kenner, MD  atorvastatin (LIPITOR) 80 MG tablet Take 1 tablet (80 mg total) by mouth daily at 6 PM. 04/23/21   Marrion Coy, MD  clopidogrel (PLAVIX) 75 MG tablet Take 1 tablet (75 mg total) by mouth daily with breakfast. 04/24/21   Marrion Coy, MD  lisinopril (ZESTRIL) 5 MG tablet Take (1/2) tablet (2.5 mg total) by mouth daily. 04/23/21   Marrion Coy, MD  metoprolol succinate (TOPROL-XL) 25 MG 24 hr tablet Take 1 tablet (25 mg total) by mouth daily. Take with or immediately following a meal. 04/23/21   Marrion Coy, MD  naproxen (NAPROSYN) 500 MG tablet Take 1 tablet (500 mg total) by mouth 2 (two) times daily with a meal. 11/30/21   Triplett, Cari B, FNP  traMADol (ULTRAM) 50 MG tablet Take 1 tablet (50 mg total) by mouth every 6 (six) hours as needed. 11/30/21   Chinita Pester, FNP    Physical Exam: Vitals:   10/04/22 0620 10/04/22 0625 10/04/22 0700 10/04/22 0800  BP: 127/88 (!) 130/90 132/74 117/84  Pulse: 80 76 78 73  Resp: 15 14 15  (!) 25  Temp:      TempSrc:      SpO2: 95% 95% 99% 99%   Physical Exam Vitals and nursing note reviewed.  Constitutional:      Comments: Expressive aphasia.  Able to follow simple commands.  Right-sided hemiparesis   HENT:     Head: Normocephalic and atraumatic.     Nose: Nose normal.     Mouth/Throat:     Mouth: Mucous membranes are moist.   Eyes:     Conjunctiva/sclera: Conjunctivae normal.  Cardiovascular:     Rate and Rhythm: Normal rate and regular rhythm.  Pulmonary:     Effort: Pulmonary effort is normal.     Breath sounds: Normal breath sounds.  Abdominal:     General: Abdomen is flat. Bowel sounds are normal.     Palpations: Abdomen is soft.  Musculoskeletal:        General: Normal range of motion.     Cervical back: Normal range of motion and neck supple.  Skin:    General: Skin is warm and dry.  Neurological:     Mental Status: He is alert.     Comments: Expressive aphasia.  Right-sided hemiparesis.  No obvious facial asymmetry.  Able to move his left upper and lower extremity  Psychiatric:        Mood  and Affect: Mood normal.        Behavior: Behavior normal.     Data Reviewed: Relevant notes from primary care and specialist visits, past discharge summaries as available in EHR, including Care Everywhere. Prior diagnostic testing as pertinent to current admission diagnoses Updated medications and problem lists for reconciliation ED course, including vitals, labs, imaging, treatment and response to treatment Triage notes, nursing and pharmacy notes and ED provider's notes Notable results as noted in HPI Labs reviewed.  Sodium 142, potassium 3.6, chloride 109, bicarb 25, glucose 99, BUN 13, creatinine 1.10 calcium 9.1, total protein 7.1, bili 3.8, AST 20, ALT 14, alkaline phosphatase 45, total bilirubin 0.7, PT 13.6, INR 1.1, white count 8.7, hemoglobin 15, hematocrit 46.5, platelet count 255 There are no new results to review at this time.  Assessment and Plan: * Acute CVA (cerebrovascular accident) [facility]) Patient with a prior history of CVA who presents to the ER as a code stroke  Patient's last known well time was 10 PM and he had a fall at about 4 AM and was found by his girlfriend with right-sided weakness and aphasia. He was seen by teleneurology and was not a candidate for thrombolytics.  CT  angiogram of the head and neck was negative for any LVO Will obtain MRI of the brain for further evaluation Obtain 2D echocardiogram to rule out LV thrombus and assess LVEF We will request ST/PT/OT consult Consult neurology Strict aspiration precautions  Polysubstance abuse (HCC) History of polysubstance abuse per EMR Unable to get any history from patient and awaiting callback from his significant other Monitor closely for signs and symptoms of withdrawal  CAD (coronary artery disease) Patient has a known history of coronary artery disease status post PCI with stent angioplasty with complications of ischemic cardiomyopathy. Last 2D echocardiogram showed an LVEF of 25 to 30% from 06/23 Continue aspirin and statins Hold metoprolol and lisinopril for permissive hypertension due to acute stroke   Essential hypertension Allow for permissive hypertension since patient had an acute stroke Hold metoprolol and lisinopril Will treat hypertension if systolic blood pressure greater than      Advance Care Planning:   Code Status: Full Code   Consults: Neurology, ST/PT/OT  Family Communication: Attempted to reach patient's significant other Charlann Boxer over the phone.  Unable to leave a voicemail.  Severity of Illness: The appropriate patient status for this patient is INPATIENT. Inpatient status is judged to be reasonable and necessary in order to provide the required intensity of service to ensure the patient's safety. The patient's presenting symptoms, physical exam findings, and initial radiographic and laboratory data in the context of their chronic comorbidities is felt to place them at high risk for further clinical deterioration. Furthermore, it is not anticipated that the patient will be medically stable for discharge from the hospital within 2 midnights of admission.   * I certify that at the point of admission it is my clinical judgment that the patient will require  inpatient hospital care spanning beyond 2 midnights from the point of admission due to high intensity of service, high risk for further deterioration and high frequency of surveillance required.*  Author: Lucile Shutters, MD 10/04/2022 9:12 AM  For on call review [email or web address].

## 2022-10-04 NOTE — Evaluation (Signed)
Occupational Therapy Evaluation Patient Details Name: Ricardo Yang MRN: [id number] DOB: 02/21/1976 Today's Date: 10/04/2022   History of Present Illness Ricardo Yang is a 46 y.o. male who was last in his normal state of health at 2200 prior to going to bed.  He presented with severe aphasia and right-sided weakness and was evaluated as an extended window code stroke this morning given his significant symptoms.   Clinical Impression   Patient seen for OT evaluation, significant other and mother present. Pt presenting with decreased independence in self care, balance, functional mobility/transfers, and endurance. Prior to admission, pt was independent in ADLs, IADLs, and functional mobility without an AD. Pt asleep initially requiring multimodal cues to maintain alertness, family reports pt being very fatigued from events of today. Once sitting EOB, alertness improved. Pt with significant expressive aphasia and not attempting to speak any words. Utilized thumbs up for "yes" and thumbs down for "no" to get pt input on simple questions with good outcome. Pt will benefit from acute OT to increase overall independence in the areas of ADLs and functional mobility in order to safely discharge to next venue of care. Recommend AIR upon discharge due to pt motivation, significant change in baseline, and to facilitate return to independent PLOF.        Recommendations for follow up therapy are one component of a multi-disciplinary discharge planning process, led by the attending physician.  Recommendations may be updated based on patient status, additional functional criteria and insurance authorization.   Follow Up Recommendations  Acute inpatient rehab (3hours/day)     Assistance Recommended at Discharge Frequent or constant Supervision/Assistance  Patient can return home with the following Two people to help with walking and/or transfers;Two people to help with bathing/dressing/bathroom    Functional  Status Assessment  Patient has had a recent decline in their functional status and demonstrates the ability to make significant improvements in function in a reasonable and predictable amount of time.  Equipment Recommendations  Other (comment) (defer to next venue of care)    Recommendations for Other Services Rehab consult     Precautions / Restrictions Precautions Precautions: Shoulder;Fall Type of Shoulder Precautions: Patient does not have shoulder subluxation at this time, however has flaccid RUE, support shoulder joint appropriately. Precaution Booklet Issued: No Restrictions Weight Bearing Restrictions: No      Mobility Bed Mobility Overal bed mobility: Needs Assistance Bed Mobility: Supine to Sit, Sit to Supine     Supine to sit: HOB elevated, Max assist Sit to supine: Max assist   General bed mobility comments: +2 to scoot up towards [facility]    Transfers                          Balance Overall balance assessment: Needs assistance Sitting-balance support: Feet supported, Single extremity supported, No upper extremity supported Sitting balance-Leahy Scale: Fair Sitting balance - Comments: VC to self correct posture, use of LUE for support, progressed to Min guard for static sitting balance, once siting EOB alertness improved and pt motivated to continue sitting up Postural control: Right lateral lean, Posterior lean                                 ADL either performed or assessed with clinical judgement   ADL Overall ADL's : Needs assistance/impaired Eating/Feeding: Set up;Bed level Eating/Feeding Details (indicate cue type and reason): set up A  using non-dominant hand for self-feeding                 Lower Body Dressing: Maximal assistance;Bed level Lower Body Dressing Details (indicate cue type and reason): socks     Toileting- Clothing Manipulation and Hygiene: Maximal assistance;Bed level               Vision Patient  Visual Report: No change from baseline       Perception     Praxis      Pertinent Vitals/Pain Pain Assessment Pain Assessment: Faces Faces Pain Scale: Hurts a little bit Pain Location: chest Pain Descriptors / Indicators: Discomfort Pain Intervention(s): Repositioned, Monitored during session (RN present to assess pt)     Hand Dominance Right   Extremity/Trunk Assessment Upper Extremity Assessment Upper Extremity Assessment: RUE deficits/detail RUE Deficits / Details: flaccid, No tone noted in biceps or triceps with MAS, 0/5 for grip strength, full PROM of UE RUE Sensation: decreased light touch RUE Coordination: decreased fine motor;decreased gross motor   Lower Extremity Assessment Lower Extremity Assessment: RLE deficits/detail RLE Deficits / Details: MAS R hamstring 2, quadriceps 1 RLE Coordination: decreased gross motor       Communication Communication Communication: Expressive difficulties (Patient is 8/10 accurate with yes/no questions answering with thumbs up for yes and thumbs down for no)   Cognition Arousal/Alertness: Lethargic, Awake/alert Behavior During Therapy: WFL for tasks assessed/performed Overall Cognitive Status: Within Functional Limits for tasks assessed                                 General Comments: Pt asleep initially requiring multimodal cues to maintain alertness, family reports pt being very fatigued from events of today. Once sitting EOB, alertness improved. Utilized thumbs up for "yes" and thumbs down for "no" to get pt input on simple questions (i.e. "are we in a hospital?"), pt answered appropriately on 8/10 trials.     General Comments       Exercises Other Exercises Other Exercises: OT provided education re: role of OT, OT POC, post acute recs, sitting up for all meals, EOB/OOB mobility with assistance, home/fall safety, educated family on optimal positioning of affected side (R arm propped up on pillow with digits  extended)   Shoulder Instructions      Home Living Family/patient expects to be discharged to:: Private residence Living Arrangements: Spouse/significant other Available Help at Discharge: Family;Available PRN/intermittently Type of Home: House Home Access: Ramped entrance     Home Layout: One level     Bathroom Shower/Tub: Chief Strategy Officer: Standard Bathroom Accessibility: No              Prior Functioning/Environment Prior Level of Function : Independent/Modified Independent;History of Falls (last six months)             Mobility Comments: independent no AD ADLs Comments: independent        OT Problem List: Decreased strength;Decreased range of motion;Decreased activity tolerance;Impaired balance (sitting and/or standing);Pain;Impaired tone;Impaired UE functional use;Decreased knowledge of use of DME or AE;Impaired sensation;Decreased coordination      OT Treatment/Interventions: Self-care/ADL training;Splinting;Therapeutic exercise;Therapeutic activities;Neuromuscular education;Cognitive remediation/compensation;Energy conservation;Patient/family education;DME and/or AE instruction;Balance training    OT Goals(Current goals can be found in the care plan section) Acute Rehab OT Goals Patient Stated Goal: get stronger, return to PLOF OT Goal Formulation: With patient/family Time For Goal Achievement: 10/18/22 Potential to Achieve Goals: Good  OT Frequency: Min 4X/week    Co-evaluation              AM-PAC OT "6 Clicks" Daily Activity     Outcome Measure Help from another person eating meals?: A Lot Help from another person taking care of personal grooming?: A Lot Help from another person toileting, which includes using toliet, bedpan, or urinal?: A Lot Help from another person bathing (including washing, rinsing, drying)?: A Lot Help from another person to put on and taking off regular upper body clothing?: A Lot Help from another  person to put on and taking off regular lower body clothing?: A Lot 6 Click Score: 12   End of Session Nurse Communication: Mobility status;Other (comment) (chest pain; RN present to assist with mobility)  Activity Tolerance: Patient limited by fatigue;Patient tolerated treatment well Patient left: in bed;with call bell/phone within reach;with bed alarm set;with family/visitor present  OT Visit Diagnosis: Other abnormalities of gait and mobility (R26.89);Muscle weakness (generalized) (M62.81);Pain;Hemiplegia and hemiparesis Hemiplegia - Right/Left: Right Hemiplegia - dominant/non-dominant: Dominant Hemiplegia - caused by: Cerebral infarction                Time: 1657-1720 OT Time Calculation (min): 23 min Charges:  OT General Charges $OT Visit: 1 Visit OT Evaluation $OT Eval Moderate Complexity: 1 Mod  [facility] MS, OTR/L ascom [phone]  10/04/22, 6:05 PM

## 2022-10-04 NOTE — ED Notes (Signed)
Code stroke called in the field spoke with Ruby

## 2022-10-04 NOTE — Progress Notes (Signed)
   10/04/22 0400  Clinical Encounter Type  Visited With Health care provider  Visit Type Initial;Code  Referral From Physician  Consult/Referral To Chaplain   Chaplain responded to Code Stroke. No family present. Chaplain services available upon request.

## 2022-10-04 NOTE — ED Notes (Signed)
Family states "[place] is c/o of pain in his chest, he keeps rubbing it". Vitals stable. EKG obtained and signed by ED MD.

## 2022-10-04 NOTE — ED Notes (Signed)
MD messaged in regard to all PO meds being held due to pt failing swallow screen and remaining NPO.

## 2022-10-04 NOTE — Consult Note (Signed)
Neurology Consultation Reason for Consult: Stroke Referring Physician: Agbata, T  CC: Right-sided weakness  History is obtained from: Chart review  HPI: Ricardo Yang is a 46 y.o. male who was last in his normal state of health at 2200 prior to going to bed.  He presented with severe aphasia and right-sided weakness and was evaluated as an extended window code stroke this morning given his significant symptoms.  Unfortunately, he was not a candidate for thrombectomy given no large vessel occlusion, but he does appear to have significant area of infarct.  LKW: 2200 tpa given?: no, outside of window Thrombectomy?  No, no LVO   Past Medical History:  Diagnosis Date   Multiple sclerosis (HCC)    a. question possible MS   Obesity    Polysubstance abuse (HCC)    a. tobacco, cocaine, crack, ecstasy, pills, "anything but injections"     Family History  Problem Relation Age of Onset   Heart disease Father      Social History:  reports that he has been smoking. He has never used smokeless tobacco. He reports current drug use. Frequency: 1.00 time per week. Drugs: "Crack" cocaine, Amphetamines, Cocaine, Hashish, Marijuana, and MDMA (Ecstacy). He reports that he does not drink alcohol.   Exam: Current vital signs: BP 125/82 (BP Location: Left Arm)   Pulse 74   Temp 98 F (36.7 C) (Oral)   Resp (!) 22   SpO2 97%  Vital signs in last 24 hours: Temp:  [98 F (36.7 C)] 98 F (36.7 C) (12/09 0438) Pulse Rate:  [73-83] 74 (12/09 1000) Resp:  [13-27] 22 (12/09 1000) BP: (117-134)/(74-101) 125/82 (12/09 1000) SpO2:  [95 %-100 %] 97 % (12/09 1000)   Physical Exam  Constitutional: Appears well-developed and well-nourished.  Psych: Affect appropriate to situation Eyes: No scleral injection HENT: No OP obstruction MSK: no joint deformities.  Cardiovascular: Normal rate and regular rhythm.  Respiratory: Effort normal, non-labored breathing GI: Soft.  No distension. There is no  tenderness.  Skin: WDI  Neuro: Mental Status: Patient is awake, alert, he is densely aphasic.  He is able to close eyes to command, but when asked to make a fist he holds up his hand open, and when asked to stick out his tongue he simply opens his mouth. Cranial Nerves: II: At times he does appear to respond to visual stimuli in the right visual field, but does not clearly blink to threat. Pupils are equal, round, and reactive to light.   III,IV, VI: EOMI without ptosis or diploplia.  VII: Facial movement with right facial weakness Motor: He has no movement of the right upper extremity, he does withdrawal the right lower extremity to noxious stimulation Sensory: He has less response to noxious stimulation on the right compared to the left  cerebellar: No clear ataxia on movements of the left side, does not cooperate with formal testing.   I have reviewed labs in epic and the results pertinent to this consultation are: CMP is unremarkable  I have reviewed the images obtained: CT head-early changes in the left parietal region, CTA/P-multifocal areas of penumbra, more on the left than right with area of significantly increased cerebral blood flow (infarct) in the left parietal region  Impression: 46 year old male with right-sided weakness and aphasia.  My suspicion is that he had an embolus that recanalized or broke up and embolized to multiple small areas.  Unfortunately, he is not a candidate for thrombectomy due to his lack of LVO, and  given the presence of infarct was not a candidate for "wake-up" thrombolytic stroke treatment either.  He will need physical therapy, speech therapy, and secondary stroke risk factor reduction.  Recommendations: - HgbA1c, fasting lipid panel - MRI of the brain without contrast - Frequent neuro checks - Echocardiogram - Prophylactic therapy-Antiplatelet med: on DAPT PTA, so ok to continue - Risk factor modification - Telemetry monitoring - PT consult,  OT consult, Speech consult    Ritta Slot, MD [facility] [phone]  If 7pm- 7am, please page neurology on call as listed in AMION.

## 2022-10-04 NOTE — ED Provider Notes (Signed)
[facility] Provider Note    Event Date/Time   First MD Initiated Contact with Patient 10/04/22 [phone]     (approximate)   History   Code Stroke (LKN 10pm yesterday. At 350 am Girlfriend states heard a thud to floor, found patient in the floor. No verbal, and unable to move)   HPI Level 5 caveat:  history/ROS limited by acute/critical illness  Ricardo Yang is a 46 y.o. male who reportedly has had prior history of CVA with no deficits, NSTEMI within the last year or so, hypertension, and tobacco use.  He presents by EMS as a code stroke.  He went to bed around 10 PM and was at his baseline.  His girlfriend woke up around 4 AM when she heard a thud and it was apparently him falling.  She found that he was not moving the right side of his body and was not speaking.  [facility] arrived and performed a stroke scale but felt they could not obtain an accurate LVO assessment because he was not speaking, so they brought him emergently to [facility].  Upon arrival the patient is tracking activity in the room and is responding to commands but is unable to speak.  He is not moving the right side of his body.  See below for additional details.     Physical Exam   ED Triage Vitals [10/04/22 0438]  Enc Vitals Group     BP 133/81     Pulse Rate 83     Resp 20     Temp 98 F (36.7 C)     Temp Source Oral     SpO2 97 %     Weight      Height      Head Circumference      Peak Flow      Pain Score      Pain Loc      Pain Edu?      Excl. in [place]?       Most recent vital signs: Vitals:   10/04/22 0620 10/04/22 0625  BP: 127/88 (!) 130/90  Pulse: 80 76  Resp: 15 14  Temp:    SpO2: 95% 95%     General: Awake, not obviously in distress. CV:  Good peripheral perfusion.  Regular rate and rhythm. Resp:  Normal effort.  Lungs clear to auscultation.  Occasional cough. Abd:  No distention.  Other:  Patient cannot move his right arm or leg.  No obvious facial  droop but he has expressive aphasia.  Left arm and leg seem normal and he is tracking me on both sides of his body.  NIH Stroke Scale  Interval: Baseline Time: 4:30 AM Person Administering Scale: Hinda Kehr  Administer stroke scale items in the order listed. Record performance in each category after each subscale exam. Do not go back and change scores. Follow directions provided for each exam technique. Scores should reflect what the patient does, not what the clinician thinks the patient can do. The clinician should record answers while administering the exam and work quickly. Except where indicated, the patient should not be coached (i.e., repeated requests to patient to make a special effort).   1a  Level of consciousness: 0=alert; keenly responsive  1b. LOC questions:  2=Performs neither task correctly  1c. LOC commands: 2=Performs neither task correctly  2.  Best Gaze: 0=normal  3.  Visual: 0=No visual loss  4. Facial Palsy: 0=Normal symmetric movement  5a.  Motor left arm: 0=No drift, limb holds 90 (or 45) degrees for full 10 seconds  5b.  Motor right arm: 4=No movement  6a. motor left leg: 0=No drift, limb holds 90 (or 45) degrees for full 10 seconds  6b  Motor right leg:  4=No movement  7. Limb Ataxia: 1=Present in one limb  8.  Sensory: 0=Normal; no sensory loss  9. Best Language:  3=mute  10. Dysarthria: 2=Severe; patient speech is so slurred as to be unintelligible in the absence of or our of proportion to any dysphagia, or is mute/anarthric  11. Extinction and Inattention: 0=No abnormality  12. Distal motor function: 2=No voluntary extension after 5 seconds. Movement of the fingers at another time are not scored   Total:   20      ED Results / Procedures / Treatments   Labs (all labs ordered are listed, but only abnormal results are displayed) Labs Reviewed  APTT - Abnormal; Notable for the following components:      Result Value   aPTT 39 (*)    All other  components within normal limits  CBG MONITORING, ED - Abnormal; Notable for the following components:   Glucose-Capillary 108 (*)    All other components within normal limits  RESP PANEL BY RT-PCR (RSV, FLU A&B, COVID)  RVPGX2  PROTIME-INR  CBC  DIFFERENTIAL  COMPREHENSIVE METABOLIC PANEL  ETHANOL  URINE DRUG SCREEN, QUALITATIVE ([facility] ONLY)  URINALYSIS, ROUTINE W REFLEX MICROSCOPIC  I-STAT CREATININE, ED     EKG  ED ECG REPORT I, Loleta Rose, the attending physician, personally viewed and interpreted this ECG.  Date: 10/04/2022 EKG Time: 4:35 AM Rate: 81 Rhythm: normal sinus rhythm QRS Axis: normal Intervals: normal ST/T Wave abnormalities: normal Narrative Interpretation: no evidence of acute ischemia    RADIOLOGY I discussed these results with Dr. Margo Aye with radiology by phone.  I viewed and interpreted the Noncon head CT.  No obvious traumatic injury such as subdural or subarachnoid hemorrhage.  I discussed it with Dr. Margo Aye and he is concerned about left MCA area LVO.  I also viewed and interpreted the patient's CTA head/neck with perfusion study.  See below for details.  No obvious LVO, confirmed by myself, neurology, and radiology.    PROCEDURES:  Critical Care performed: Yes, see critical care procedure note(s)  .1-3 Lead EKG Interpretation  Performed by: Loleta Rose, MD Authorized by: Loleta Rose, MD     Interpretation: normal     ECG rate:  80   ECG rate assessment: normal     Rhythm: sinus rhythm     Ectopy: none     Conduction: normal   .Critical Care  Performed by: Loleta Rose, MD Authorized by: Loleta Rose, MD   Critical care provider statement:    Critical care time (minutes):  75   Critical care time was exclusive of:  Separately billable procedures and treating other patients   Critical care was necessary to treat or prevent imminent or life-threatening deterioration of the following conditions:  CNS failure or compromise    Critical care was time spent personally by me on the following activities:  Development of treatment plan with patient or surrogate, evaluation of patient's response to treatment, examination of patient, obtaining history from patient or surrogate, ordering and performing treatments and interventions, ordering and review of laboratory studies, ordering and review of radiographic studies, pulse oximetry, re-evaluation of patient's condition and review of old charts    MEDICATIONS ORDERED IN ED: Medications  sodium  chloride flush (NS) 0.9 % injection 3 mL (3 mLs Intravenous Given 10/04/22 0552)  iohexol (OMNIPAQUE) 350 MG/ML injection 100 mL (100 mLs Intravenous Contrast Given 10/04/22 0523)  aspirin suppository 300 mg (300 mg Rectal Given 10/04/22 0600)     IMPRESSION / MDM / ASSESSMENT AND PLAN / ED COURSE  I reviewed the triage vital signs and the nursing notes.                              Differential diagnosis includes, but is not limited to, CVA, intracranial hemorrhage, electrolyte or metabolic abnormality, medication or drug side effect, seizure.  Patient's presentation is most consistent with acute presentation with potential threat to life or bodily function.  Code stroke activated, patient taken immediately to CT scan and then to room.  He is awake and alert upon arrival, following commands but with complete right-sided hemiplegia and expressive aphasia.  Teleneurology evaluation in progress, awaiting the CT head to show up for me to review and discussed with radiology.  Labs/studies ordered: CBC, urine drug screen, differential, urinalysis, comprehensive metabolic panel, pro time-INR, APTT, ethanol (all as per protocol).  Also ordered respiratory viral panel.  The patient's CBG is 108 which is reassuring.  Vital signs are stable and within normal limits including his blood pressure.  He does not seem to be in distress at this time but seems aware that something is wrong based on  his gesturing of confusion with his left arm when asked to do things.  The patient is on the cardiac monitor to evaluate for evidence of arrhythmia and/or significant heart rate changes.   Clinical Course as of 10/04/22 [id number]  Sat Oct 04, 2022  0446 Dr. Nevada Crane with radiology called me about the CT scan.  He said that there is nothing definitive but it is suspicious for a left MCA territory LVO.  This would correlate clinically with the presentation.  The patient is currently undergoing teleneurology evaluation.  I anticipate he will be going shortly for CTA/perfusion studies. [CF]  [phone] Discussed case via the teleneurology cart with the teleneurologist evaluated the patient.  She and I agree that thrombolytics are not indicated given the time window (nearly 7 hours since last known normal).  However the patient is very suspicious for LVO and she ordered the CTA head/neck with perfusion study.  The patient may benefit from transfer to [facility] depending on the findings. [CF]  [id number] CT ANGIO HEAD NECK W WO CM W PERF (CODE STROKE) Discussed CTA head/neck with perfusion study with Dr. Nevada Crane by phone.  I also personally viewed and interpreted the images.  I could see no evidence of LVO and Dr. Nevada Crane agreed.  He said that there is some evidence of a relatively small left MCA infarction but no LVO.  Awaiting the opportunity to do discuss with neurology, but it seems that there is no method for emergent intervention and no necessity for transfer at this time. [CF]  [phone] Attempting to call Dr. Augusto Gamble (teleneurology). [CF]  [id number] I consulted again with Dr. Carron Brazen, the neurologist, by phone.  She confirmed the plan for aspirin 300 mg PR if the patient fails the stroke swallow screen (which seems to be the case) and admission to [facility] for further stroke workup.  No indication for transfer at this time.  I am consulting the hospitalist service for admission. [CF]  [phone] Consulted by secure chat text message with  Dr. Para March with the hospitalist service.  She will admit the patient for further management of his CVA. [CF]  [phone] Reassessed the patient.  No clinical change at this time in either his general physical exam or his neurological exam. [CF]    Clinical Course User Index [CF] Loleta Rose, MD     FINAL CLINICAL IMPRESSION(S) / ED DIAGNOSES   Final diagnoses:  Cerebrovascular accident (CVA), unspecified mechanism (HCC)  Right hemiplegia (HCC)  Expressive aphasia     Rx / DC Orders   ED Discharge Orders     None        Note:  This document was prepared using Dragon voice recognition software and may include unintentional dictation errors.   Loleta Rose, MD 10/04/22 [phone]

## 2022-10-04 NOTE — ED Notes (Signed)
RN messaged admission MD to see if MRI would be beneficial to pt.

## 2022-10-04 NOTE — ED Notes (Signed)
Pt to MRI

## 2022-10-04 NOTE — ED Notes (Signed)
Bypass Creatinine for CVA per York Cerise MD

## 2022-10-04 NOTE — Evaluation (Signed)
Physical Therapy Evaluation Patient Details Name: Ricardo Yang MRN: [id number] DOB: 24-Sep-1976 Today's Date: 10/04/2022  History of Present Illness  Ricardo Yang is a 46 y.o. male who was last in his normal state of health at 2200 prior to going to bed.  He presented with severe aphasia and right-sided weakness and was evaluated as an extended window code stroke this morning given his significant symptoms.  Clinical Impression  The pt presents with a willingness to work with PT. He presents with significant expressive aphasia, not mouthing or making sounds throughout session. He demonstrates 8/10 correct trials with answering yes/no questions with a thumbs up for yes and thumbs down for no. The pt is able to participate in bed mobility and static sitting activities this session. It is expected that with continued skilled therapeutic intervention he will progress to tolerate 3 hours of therapy daily for acute inpatient rehabilitation. PT will continue to follow.        Recommendations for follow up therapy are one component of a multi-disciplinary discharge planning process, led by the attending physician.  Recommendations may be updated based on patient status, additional functional criteria and insurance authorization.  Follow Up Recommendations Acute inpatient rehab (3hours/day)      Assistance Recommended at Discharge Frequent or constant Supervision/Assistance  Patient can return home with the following  Two people to help with walking and/or transfers;Two people to help with bathing/dressing/bathroom    Equipment Recommendations    Recommendations for Other Services       Functional Status Assessment Patient has had a recent decline in their functional status and demonstrates the ability to make significant improvements in function in a reasonable and predictable amount of time.     Precautions / Restrictions Precautions Precautions: Shoulder;Fall Type of Shoulder Precautions:  Patient does not have shoulder subluxation at this time, however has flaccid RUE, support shoulder joint appropriately. Precaution Booklet Issued: No Restrictions Weight Bearing Restrictions: No      Mobility  Bed Mobility Overal bed mobility: Needs Assistance Bed Mobility: Rolling, Supine to Sit, Sit to Supine Rolling: Min assist (To the R, Supervision, To the L Min A; use of railing with both)   Supine to sit: Mod assist (log roll to the R) Sit to supine: Mod assist (LE managment)        Transfers                        Ambulation/Gait                  Stairs            Wheelchair Mobility    Modified Rankin (Stroke Patients Only)       Balance Overall balance assessment: Needs assistance Sitting-balance support: No upper extremity supported Sitting balance-Leahy Scale: Fair Sitting balance - Comments: Pt able to progress to quiet sitting without UE support. Demonstrates difficulty with maintaining balance with pertubations laterally.                                     Pertinent Vitals/Pain Pain Assessment Pain Assessment: No/denies pain    Home Living Family/patient expects to be discharged to:: Private residence Living Arrangements: Spouse/significant other Available Help at Discharge: Family;Available PRN/intermittently Type of Home: House Home Access: Ramped entrance       Home Layout: One level  Prior Function Prior Level of Function : Independent/Modified Independent;History of Falls (last six months) (fall with admission)                     Hand Dominance   Dominant Hand: Right    Extremity/Trunk Assessment   Upper Extremity Assessment Upper Extremity Assessment: RUE deficits/detail RUE Deficits / Details: flaccid, No tone noted in biceps or triceps with MAS RUE Sensation: decreased light touch    Lower Extremity Assessment Lower Extremity Assessment: RLE deficits/detail RLE  Deficits / Details: MAS R hamstring 2, quadriceps 1 RLE Coordination: decreased gross motor       Communication   Communication: Expressive difficulties (Patient is 8/10 accurate with yes/no questions answering with thumbs up for yes and thumbs down for no)  Cognition Arousal/Alertness: Awake/alert Behavior During Therapy: WFL for tasks assessed/performed Overall Cognitive Status: Within Functional Limits for tasks assessed                                          General Comments      Exercises Other Exercises Other Exercises: Lateral prop on elbows bilaterally x3. Min A for positioning. L lateral prop: Min Guard for return to midline R lateral prop: Min A for return to midline with use of LUE   Assessment/Plan    PT Assessment Patient needs continued PT services  PT Problem List Decreased strength;Decreased coordination;Decreased mobility;Decreased balance;Decreased activity tolerance;Decreased skin integrity;Impaired tone       PT Treatment Interventions Gait training;Therapeutic exercise;Therapeutic activities;Neuromuscular re-education;Functional mobility training;Patient/family education;Wheelchair mobility training;Balance training    PT Goals (Current goals can be found in the Care Plan section)  Acute Rehab PT Goals Patient Stated Goal: Pt reporting yes to wanting to regain independence. PT Goal Formulation: With patient Time For Goal Achievement: 10/18/22 Potential to Achieve Goals: Good    Frequency 7X/week     Co-evaluation               AM-PAC PT "6 Clicks" Mobility  Outcome Measure Help needed turning from your back to your side while in a flat bed without using bedrails?: A Lot Help needed moving from lying on your back to sitting on the side of a flat bed without using bedrails?: A Lot Help needed moving to and from a bed to a chair (including a wheelchair)?: A Lot Help needed standing up from a chair using your arms (e.g.,  wheelchair or bedside chair)?: A Lot Help needed to walk in hospital room?: Total Help needed climbing 3-5 steps with a railing? : Total 6 Click Score: 10    End of Session   Activity Tolerance: Patient tolerated treatment well Patient left: in bed;with family/visitor present;with call bell/phone within reach Nurse Communication: Mobility status PT Visit Diagnosis: Muscle weakness (generalized) (M62.81);Difficulty in walking, not elsewhere classified (R26.2);Hemiplegia and hemiparesis Hemiplegia - Right/Left: Right Hemiplegia - dominant/non-dominant: Dominant Hemiplegia - caused by: Cerebral infarction    Time: 3903-0092 PT Time Calculation (min) (ACUTE ONLY): 43 min   Charges:   PT Evaluation $PT Eval Moderate Complexity: 1 Mod PT Treatments $Therapeutic Activity: 23-37 mins        4:00 PM, 10/04/22 Amandamarie Feggins A. Mordecai Maes PT, DPT Physical Therapist - [facility]   Curley Fayette A Rhyland Hinderliter 10/04/2022, 3:58 PM

## 2022-10-04 NOTE — Assessment & Plan Note (Signed)
Patient has a known history of coronary artery disease status post PCI with stent angioplasty with complications of ischemic cardiomyopathy. Last 2D echocardiogram showed an LVEF of 25 to 30% from 06/23 Continue aspirin and statins Hold metoprolol and lisinopril for permissive hypertension due to acute stroke

## 2022-10-04 NOTE — ED Notes (Signed)
Secretary asked to order hospital bed

## 2022-10-04 NOTE — Assessment & Plan Note (Signed)
Allow for permissive hypertension since patient had an acute stroke Hold metoprolol and lisinopril Will treat hypertension if systolic blood pressure greater than 

## 2022-10-04 NOTE — ED Notes (Signed)
Pt in CT Angio

## 2022-10-04 NOTE — Assessment & Plan Note (Addendum)
History of polysubstance abuse per EMR Unable to get any history from patient and awaiting callback from his significant other Monitor closely for signs and symptoms of withdrawal

## 2022-10-04 NOTE — ED Notes (Signed)
Staff assisted pt onto hospital bed at this time. HOB elevated to 30 degrees per order. Pt appears more comfortable at this time.

## 2022-10-04 NOTE — Consult Note (Addendum)
[facility] TeleNeurology Consult Services   Patient Name:   Ricardo Yang, Ricardo Yang Date of Birth:   05/09/1976 Identification Number:   MRN - [id number] Date of Service:   10/04/2022 04:34:28  Diagnosis:       R47.01 - Aphasia       R53.1 - Weakness  Impression:      46y/o man w/ vascular risk factors brough to ED after falling from bed, found to have aphasia and Rt sided weakness, LKW 2200 before going to bed per EMS report, NIHSS:15 w/ severe aphasia (expressive>receptive), Rt hemiplegia, head CT w/ no acute findings, he is not an IV thrombolytics candidate as is out of the window, CTA/CTP w/ no LVO, no indications for acute NIR. Recommend to admit for close monitoring and stroke work up.  Our recommendations are outlined below.  Recommendations:        Stroke/Telemetry Floor       Neuro Checks       Bedside Swallow Eval       DVT Prophylaxis       IV Fluids, Normal Saline       Head of Bed 30 Degrees       Euglycemia and Avoid Hyperthermia (PRN Acetaminophen)       Aspirin per rectum       Antihypertensives PRN if Blood pressure is greater than 220/120 or there is a concern for End organ damage/contraindications for permissive HTN. If blood pressure is greater than 220/120 give labetalol PO or IV or Vasotec IV with a goal of 15% reduction in BP during the first 24 hours.       infectious/metabolic work up per ED/primary team       Consider MRI brain w/o gad, 2Decho w/ bubble study, TSH, B12 levels, Lipids and TSH levels for stroke work up  Sign Out:       Discussed with Emergency Department Provider    ------------------------------------------------------------------------------  Advanced Imaging: CTA/CTP w/ no LVO, no indications for acute NIR   Metrics: Last Known Well: 10/03/2022 22:00:00 [facility] Notification Time: 10/04/2022 04:34:28 Arrival Time: 10/04/2022 04:23:00 Stamp Time: 10/04/2022 04:34:28 Initial Response Time: 10/04/2022  04:37:56 Symptoms: aphasia and Rt sided weakness. Initial patient interaction: 10/04/2022 04:40:56 NIHSS Assessment Completed: 10/04/2022 04:44:45 Patient is not a candidate for Thrombolytic. Thrombolytic Medical Decision: 10/04/2022 04:44:46 Patient was not deemed candidate for Thrombolytic because of following reasons: Last Well Known Above 4.5 Hours.  CT head showed no acute hemorrhage or acute core infarct.  Primary Provider Notified of Diagnostic Impression and Management Plan on: 10/04/2022 04:55:03    ------------------------------------------------------------------------------  History of Present Illness:  Patient was brought by EMS for symptoms of aphasia and Rt sided weakness. 46y/o man w/ HTN, CAD, hx of stroke? w/ no residual deficits presenting for evaluation after falling from bed, per EMS report, GF found him w/ aphasia and Rt sided weakness, called 911. Hx obtained from chart review, ED attending's and bedside nurse report as patient has expressive>receptive aphasia, unable to provide history or complains, and number called from EMR: Mikky: [phone] answered but said does not know anyone in the hospital and hung up. LKW per EMS report 2200 when patient went to bed fine.   Past Medical History:      Hypertension      Coronary Artery Disease      Stroke  Medications:  No Anticoagulant use  Antiplatelet use: Unknown Reviewed EMR for current medications  Allergies:  Reviewed  Social History: Smoking: Yes  Family History:  There is no family history of premature cerebrovascular disease pertinent to this consultation  ROS : 14 Points Review of Systems was performed and was negative except mentioned in HPI.  Past Surgical History: There Is No Surgical History Contributory To Today's Visit    Examination: BP(133/81), Pulse(82), Blood Glucose(108) 1A: Level of Consciousness - Alert; keenly responsive + 0 1B: Ask Month and Age - Aphasic + 2 1C: Blink  Eyes & Squeeze Hands - Performs Both Tasks + 0 2: Test Horizontal Extraocular Movements - Normal + 0 3: Test Visual Fields - No Visual Loss + 0 4: Test Facial Palsy (Use Grimace if Obtunded) - Minor paralysis (flat nasolabial fold, smile asymmetry) + 1 5A: Test Left Arm Motor Drift - No Drift for 10 Seconds + 0 5B: Test Right Arm Motor Drift - No Movement + 4 6A: Test Left Leg Motor Drift - No Drift for 5 Seconds + 0 6B: Test Right Leg Motor Drift - No Movement + 4 7: Test Limb Ataxia (FNF/Heel-Shin) - No Ataxia + 0 8: Test Sensation - Normal; No sensory loss + 0 9: Test Language/Aphasia - Severe Aphasia: Fragmentary Expression, Inference Needed, Cannot Identify Materials + 2 10: Test Dysarthria - Mute/Anarthric + 2 11: Test Extinction/Inattention - No abnormality + 0  NIHSS Score: 15   Pre-Morbid Modified Rankin Scale: 1 Points = No significant disability despite symptoms; able to carry out all usual duties and activities  Spoke with : Dr. York Cerise at bedside  Patient/Family was informed the Neurology Consult would occur via TeleHealth consult by way of interactive audio and video telecommunications and consented to receiving care in this manner.   Patient is being evaluated for possible acute neurologic impairment and high probability of imminent or life-threatening deterioration. I spent total of 35 minutes providing care to this patient, including time for face to face visit via telemedicine, review of medical records, imaging studies and discussion of findings with providers, the patient and/or family.   Dr Josem Kaufmann Gaspar Bidding   [facility] For Inpatient follow-up with [facility] physician please call [facility] [phone]. This is not an outpatient service. Post hospital discharge, please contact hospital directly.  Please do not communicate with [facility] physicians via secure chat. If you have any questions, Please contact [facility].

## 2022-10-04 NOTE — ED Notes (Signed)
Speech in the room with the pt

## 2022-10-04 NOTE — ED Notes (Signed)
Pt repositioned in bed by 2 Rns.  

## 2022-10-04 NOTE — Assessment & Plan Note (Signed)
Patient with a prior history of CVA who presents to the ER as a code stroke  Patient's last known well time was 10 PM and he had a fall at about 4 AM and was found by his girlfriend with right-sided weakness and aphasia. He was seen by teleneurology and was not a candidate for thrombolytics.  CT angiogram of the head and neck was negative for any LVO Will obtain MRI of the brain for further evaluation Obtain 2D echocardiogram to rule out LV thrombus and assess LVEF We will request ST/PT/OT consult Consult neurology Strict aspiration precautions

## 2022-10-05 ENCOUNTER — Inpatient Hospital Stay
Admit: 2022-10-05 | Discharge: 2022-10-05 | Disposition: A | Discharge status: Home / Self Care | Payer: Medicaid Other | Attending: Internal Medicine | Admitting: Internal Medicine

## 2022-10-05 DIAGNOSIS — Z9861 Coronary angioplasty status: Secondary | ICD-10-CM

## 2022-10-05 DIAGNOSIS — I2510 Atherosclerotic heart disease of native coronary artery without angina pectoris: Secondary | ICD-10-CM

## 2022-10-05 DIAGNOSIS — I10 Essential (primary) hypertension: Secondary | ICD-10-CM

## 2022-10-05 DIAGNOSIS — G8191 Hemiplegia, unspecified affecting right dominant side: Secondary | ICD-10-CM

## 2022-10-05 DIAGNOSIS — R4701 Aphasia: Secondary | ICD-10-CM

## 2022-10-05 HISTORY — DX: Essential (primary) hypertension: I10

## 2022-10-05 LAB — LIPID PANEL
Cholesterol: 155 mg/dL (ref 0–200)
HDL: 36 mg/dL — ABNORMAL LOW (ref >40)
LDL Cholesterol: 108 mg/dL — ABNORMAL HIGH (ref 0–99)
Total CHOL/HDL Ratio: 4.3 RATIO
Triglycerides: 57 mg/dL (ref <150)
VLDL: 11 mg/dL (ref 0–40)

## 2022-10-05 MED ORDER — ASPIRIN 81 MG PO CHEW
81.0000 mg | CHEWABLE_TABLET | Freq: Every day | ORAL | Status: DC
  Administered 2022-10-05 – 2022-10-09 (×4): 81 mg via ORAL
  Filled 2022-10-05 (×4): qty 1

## 2022-10-05 MED ORDER — PERFLUTREN LIPID MICROSPHERE
1.0000 mL | INTRAVENOUS | Status: AC | PRN
  Administered 2022-10-05: 4 mL via INTRAVENOUS

## 2022-10-05 NOTE — Progress Notes (Signed)
Subjective: He has markedly improved  Exam: Vitals:   10/05/22 1733 10/05/22 2033  BP: (!) 146/100 (!) 129/99  Pulse: 79 84  Resp: 18 19  Temp: 99 F (37.2 C) 98.5 F (36.9 C)  SpO2: 99% 97%   Gen: In bed, NAD Resp: non-labored breathing, no acute distress Abd: soft, nt  Neuro: MS: Awake, alert, oriented x 3.  He continues to have expressive aphasia but markedly improved from yesterday. CN: He is able to count fingers in all visual fields, right facial weakness Motor: He continues to have severe right lower arm weakness, 3/5 right leg weakness, 5/5 on left Sensory: Diminished in the right side   Pertinent Labs: UDS positive for cocaine  Impression: 46 year old male with left MCA territory infarct.  My suspicion is that he had proximal occlusion that spontaneously recanalized.  Luckily,  it was a subtotal infarct and his exam is improving.  I think that cocaine likely played a significant role here and vasospasm may be the etiology.  That being said, given a 46 year old with an embolic appearing stroke if TTE is negative, may be worthwhile to do prolonged cardiac monitoring and even consider TEE.  Recommendations: 1) LDL 108, agree with atorvastatin 2) continue home aspirin and Plavix 3) follow-up TTE 4) advise cocaine cessation 5) neurology will continue to follow  Ritta Slot, MD [facility] [phone]  If 7pm- 7am, please page neurology on call as listed in [facility].

## 2022-10-05 NOTE — Progress Notes (Signed)
Inpatient Rehab Admissions Coordinator:   Per therapy recommendations, patient was screened for CIR candidacy by Megan Salon, MS, CCC-SLP . At this time, Pt. is not yet tolerating OOB/attempting transfers and is not participating at a level that I am confident he could tolerate the intensity of CIR; however,  Pt. may have potential to progress to becoming a potential CIR candidate, so CIR admissions team will follow and monitor for progress and participation with therapies and place consult order if Pt. appears to be an appropriate candidate. Please contact me with any questions.   Megan Salon, MS, CCC-SLP Rehab Admissions Coordinator  [phone] (celll) [phone] (office)

## 2022-10-05 NOTE — Progress Notes (Signed)
Speech Language Pathology Treatment:    Patient Details Name: Ricardo Yang MRN: [id number] DOB: [birth year] Today's Date: 10/05/2022 Time: 1130-1150 SLP Time Calculation (min) (ACUTE ONLY): 20 min  Assessment / Plan / Recommendation Clinical Impression  Pt seen for clinical swallowing re-evaluation. Pt eager for trials of upgraded textures and liquids. Fiance and mother at bedside. Pt answering simple/biographical questions with extra time. Endorsed wordfinding difficulty.   Pt given trials of solid (saltine cracker) and thin liquid (~3 oz water via straw). Pt with s/sx mild oral dysphagia c/b mildly prolonged mastication of solids. Adequate oral clearance noted. Pharyngeal swallow appeared [facility] per clinical re-assessment. No overt s/sx pharyngeal dysphagia. Seemingly timely swallow initiation and seemingly adequate laryngeal elevation. No change to vocal quality across trials.   Recommend diet upgrade to mech soft diet with thin liquids and safe swallowing strategies/aspiration precautions as outlined below.   SLP to f/u per POC for diet tolerance and speech/language evaluation.   Anticipate need for 24 hour supervision/assistance and post-acute SLP services for cognitive-communication deficits.  Pt, family, and RN made aware of results, recommendations, and SLP POC. Pt nodding in agreement.   HPI HPI: Per admitting H&P "Most of the history was obtained from EMR.  Patient is aphasic and unable to provide any history  HPI: Ricardo Yang is a 46 y.o. male with medical history significant for prior CVA with no deficits, coronary artery disease with complications of ischemic cardiomyopathy with last known LVEF of 25 to 30% from a 2D echocardiogram which was done 06/23, hypertension and nicotine dependence who presented to the ER via EMS as a code stroke.  Patient was said to have gone to bed around 10 PM and was at his baseline.  His girlfriend woke up at around 4 AM and had a loud thud and found the  patient on the floor.  Patient was unable to move the right side of his body and could not speak.  He was brought in emergently to the ER as a code stroke.  Teleneurology was consulted and patient deemed not a candidate for IV thrombolytics as he was outside the window.  CT angiogram showed no LVO and so no indication for acute NIR.  During my evaluation he is able to move his left upper and lower extremity but has right sided hemiparesis and remains aphasic but able to follow simple commands.  I am unable to do a review of systems on this patient due to his underlying aphasia  CT scan of the head without contrast shows leftward gaze, but no acute cortically based infarct or intracranial hemorrhage identified. And subtle if any left MCA vessel asymmetry.  ASPECTS 10.  CT angiogram of the head and neck shows sub optimal although adequate contrast bolus.  Strong evidence on CTP of a small posterior Left MCA territory infarct core (approximately 12 mL). CTP also suggests approximately 60 mL of oligemia in the Left MCA territory. However, there is NO large vessel occlusion, and no discrete MCA branch occlusion detected on CTA. No atherosclerosis, stenosis, or arterial abnormality identified  in the head or neck. There is a left coronary artery stent visible in the upper chest. Mild upper lung atelectasis and small volume retained secretions in the trachea.  Twelve-lead EKG reviewed by me shows sinus rhythm with Q waves in the lateral leads      SLP Plan  Continue with current plan of care      Recommendations for follow up therapy are one component of  a multi-disciplinary discharge planning process, led by the attending physician.  Recommendations may be updated based on patient status, additional functional criteria and insurance authorization.    Recommendations  Diet recommendations: Dysphagia 3 (mechanical soft);Thin liquid Medication Administration: Crushed with puree Supervision: Patient able to self  feed;Intermittent supervision to cue for compensatory strategies Compensations: Minimize environmental distractions;Slow rate;Small sips/bites;Follow solids with liquid Postural Changes and/or Swallow Maneuvers: Out of bed for meals;Seated upright 90 degrees;Upright 30-60 min after meal                General recommendations: Rehab consult Oral Care Recommendations: Oral care QID Follow Up Recommendations: Acute inpatient rehab (3hours/day) Assistance recommended at discharge: Frequent or constant Supervision/Assistance SLP Visit Diagnosis: Dysphagia, oral phase (R13.11) Plan: Continue with current plan of care          Clyde Canterbury, M.S., CCC-SLP Speech-Language Pathologist [facility] [phone] Ricardo Yang)   Woodroe Chen  10/05/2022, 12:07 PM

## 2022-10-05 NOTE — Progress Notes (Addendum)
Physical Therapy Treatment Patient Details Name: Ricardo Yang MRN: [id number] DOB: 1976/09/01 Today's Date: 10/05/2022   History of Present Illness Ricardo Yang is a 46 y.o. male who was last in his normal state of health at 2200 prior to going to bed.  He presented with severe aphasia and right-sided weakness and was evaluated as an extended window code stroke this morning given his significant symptoms.    PT Comments    Pt awake and able to answer questions appropriately today.  Some word finding difficulty but able to hold conversation with no need for hand signals today.  Supine and seated AAROM RLE.  Pt is able to assist in all ranges for LE but does need assist to complete full range.  Fatigues after about 5 reps per ex.  He is able to get to EOB on L side with mod a x 1 and HOB raised with rail.  Generally steady in sitting.  He is able to stand x 2 at EOB with mod a x 2 with cues for glut activation to stand up fully.  Time spent weight shifting left/right in standing.  He does improve on second stand with less R lean and overall awareness of positioning.  Returns to supine with mod a x 1 for LE's and is able to get up on R side of bed with mod a x 1 and bed features. He is able to stand and transfer to recliner at bedside moving to R with min a x 1 and mod a x 1.  He is able to take 2 small steps with mod verbal and tactile cues. Remained up in chair after session and discussed mobility with tech. Care taken to support and increase awareness of R UE during session.   Pt with overall improvement in session today.  Highly motivated with supportive wife in attendance.  CIR remains appropriate for discharge.  Overall excellent strength L side which will serve him well as mobility progresses.   Recommendations for follow up therapy are one component of a multi-disciplinary discharge planning process, led by the attending physician.  Recommendations may be updated based on patient status,  additional functional criteria and insurance authorization.  Follow Up Recommendations  Acute inpatient rehab (3hours/day)     Assistance Recommended at Discharge Frequent or constant Supervision/Assistance  Patient can return home with the following Two people to help with walking and/or transfers;Two people to help with bathing/dressing/bathroom;Assist for transportation;Help with stairs or ramp for entrance;Assistance with cooking/housework   Equipment Recommendations       Recommendations for Other Services       Precautions / Restrictions Precautions Precautions: Shoulder;Fall Type of Shoulder Precautions: Patient does not have shoulder subluxation at this time, however has flaccid RUE, support shoulder joint appropriately. Restrictions Weight Bearing Restrictions: No     Mobility  Bed Mobility Overal bed mobility: Needs Assistance Bed Mobility: Supine to Sit, Sit to Supine Rolling: Min assist   Supine to sit: Min assist, Mod assist Sit to supine: Mod assist   General bed mobility comments: OOB on left and right side of bed x 1 each for functional activities and education on hand placement and techniques    Transfers Overall transfer level: Needs assistance Equipment used: 2 person hand held assist Transfers: Sit to/from Stand, Bed to chair/wheelchair/BSC Sit to Stand: Mod assist, +2 physical assistance Stand pivot transfers: Mod assist, +2 physical assistance, Min assist         General transfer comment: stood x  2 at EOB with emphasis on standing tall and glut activation and wieght shifting left/right.    Ambulation/Gait Ambulation/Gait assistance: Min assist, Mod assist, +2 physical assistance Gait Distance (Feet): 2 Feet Assistive device: 2 person hand held assist Gait Pattern/deviations: Step-to pattern Gait velocity: dec     General Gait Details: pt is able to take 2 small steps to chair with verbal and tactile cues and assist for weight  shifting.   Stairs             Wheelchair Mobility    Modified Rankin (Stroke Patients Only)       Balance Overall balance assessment: Needs assistance Sitting-balance support: Feet supported Sitting balance-Leahy Scale: Good Sitting balance - Comments: generally steady in sitting but close suprvision provided.   Standing balance support: Single extremity supported Standing balance-Leahy Scale: Poor Standing balance comment: +2 hands on assist at all times for pt and staff safety                            Cognition Arousal/Alertness: Awake/alert Behavior During Therapy: [facility] for tasks assessed/performed Overall Cognitive Status: Within Functional Limits for tasks assessed                                 General Comments: able to answer all questions approprietly during session.  some slowed responses but overall significanlty improved        Exercises Other Exercises Other Exercises: supine and seated AAROM  - he is able to actively assist R LE ex but needs assist to complete full range due to weakness.    General Comments        Pertinent Vitals/Pain Pain Assessment Pain Assessment: No/denies pain    Home Living                          Prior Function            PT Goals (current goals can now be found in the care plan section) Progress towards PT goals: Progressing toward goals    Frequency           PT Plan Current plan remains appropriate    Co-evaluation              AM-PAC PT "6 Clicks" Mobility   Outcome Measure  Help needed turning from your back to your side while in a flat bed without using bedrails?: A Little Help needed moving from lying on your back to sitting on the side of a flat bed without using bedrails?: A Lot Help needed moving to and from a bed to a chair (including a wheelchair)?: A Lot Help needed standing up from a chair using your arms (e.g., wheelchair or bedside chair)?:  A Lot Help needed to walk in hospital room?: Total Help needed climbing 3-5 steps with a railing? : Total 6 Click Score: 11    End of Session Equipment Utilized During Treatment: Gait belt Activity Tolerance: Patient tolerated treatment well Patient left: in chair;with call bell/phone within reach;with chair alarm set Nurse Communication: Mobility status PT Visit Diagnosis: Muscle weakness (generalized) (M62.81);Difficulty in walking, not elsewhere classified (R26.2);Hemiplegia and hemiparesis Hemiplegia - Right/Left: Right Hemiplegia - dominant/non-dominant: Dominant Hemiplegia - caused by: Cerebral infarction     Time: 0235-0300 PT Time Calculation (min) (ACUTE ONLY): 25 min  Charges:  $Therapeutic Exercise:  8-22 mins $Therapeutic Activity: 8-22 mins                   Danielle Dess, PTA 10/05/22, 3:40 PM

## 2022-10-05 NOTE — Progress Notes (Signed)
PROGRESS NOTE  Ricardo Yang    DOB: 09-03-1976, 46 y.o.  GYJ:[id number]    Code Status: Full Code   DOA: 10/04/2022   LOS: 1   Brief hospital course  Ricardo Yang is a 46 y.o. male with a PMH significant for prior CVA with no deficits, coronary artery disease with complications of ischemic cardiomyopathy with last known LVEF of 25 to 30% from a 2D echocardiogram which was done 06/23, hypertension and nicotine dependence.  They presented from home to the ED via EMS on 10/04/2022 with right sided weakness and global aphagia occurring overnight. Pt's girlfriend called EMS and he was transported to hospital with code stroke. Neurology evaluated on presentation.   In the ED, it was found that they had stable vital signs.  Significant findings included CT head-early changes in the left parietal region, CTA/P-multifocal areas of penumbra, more on the left than right with area of significantly increased cerebral blood flow (infarct) in the left parietal region.  He was not a candidate for intervention.  They were initially treated with supportive care.   Patient was admitted to medicine service for further workup and management of acute CVA as outlined in detail below.  10/05/22 -improving. Speech returned with delay  Assessment & Plan  Principal Problem:   Acute CVA (cerebrovascular accident) [facility]) Active Problems:   Essential hypertension   CAD (coronary artery disease)   Polysubstance abuse (HCC)  Acute CVA- as seen on brain MRI. CT angiogram of the head and neck was negative for any LVO. Not an interventional candidate due to lack of LVO and timing since onset. Global aphagia on presentation and now speaking clearly with word delay - neurology following, appreciate recs - f/u echo - ASA, plavix - PT/OT/SLP   Polysubstance abuse (HCC) Monitor closely for signs and symptoms of withdrawal   CAD  HTN- s/p PCI with stent angioplasty with complications of ischemic cardiomyopathy. Last  2D echocardiogram showed an LVEF of 25 to 30% from 06/23 - Continue aspirin and statins - Hold metoprolol and lisinopril for permissive hypertension due to acute stroke. Gradually add back starting tomorrow - treat >220 systolic   Body mass index is 26.37 kg/m.  VTE ppx: SCD's Start: 10/04/22 8588   Diet:     Diet   DIET DYS 2 Room service appropriate? Yes; Fluid consistency: Nectar Thick   Consultants: Neurology   Subjective 10/05/22    Pt reports feeling improved. Able to move right leg independently. Still no motor in right arm. Denies pain, SOB. Asks about how he can recover. Questions and concerns addressed at time of encounter   Objective   Vitals:   10/04/22 1812 10/04/22 2042 10/05/22 0010 10/05/22 0514  BP: (!) 136/99 (!) 133/99 122/85 130/85  Pulse: 83 80 80 78  Resp: 16 18 18 20   Temp:  99.1 F (37.3 C) 98.7 F (37.1 C) 98.5 F (36.9 C)  TempSrc:  Oral Oral   SpO2: 98% 100% 97% 100%  Weight:  109.1 kg    Height:  6\' 3"  (1.905 m)      Intake/Output Summary (Last 24 hours) at 10/05/2022 0736 Last data filed at 10/04/2022 1917 Gross per 24 hour  Intake --  Output 700 ml  Net -700 ml   Filed Weights   10/04/22 2042  Weight: 109.1 kg     Physical Exam:  General: awake, alert, NAD HEENT: atraumatic, clear conjunctiva, anicteric sclera, MMM, hearing grossly normal Respiratory: normal respiratory effort. Cardiovascular: quick capillary  refill, normal S1/S2, RRR, no JVD, murmurs Gastrointestinal: soft, NT, ND Nervous: A&O x3. Speech delayed with clear phonation. No motor ability to right arm. Left arm normal. Normal left leg sensation/strength. R leg decreased strength but has spontaneous motor activity.  Extremities: no edema, normal tone Skin: dry, intact, normal temperature, normal color. No rashes, lesions or ulcers on exposed skin Psychiatry: normal mood, congruent affect  Labs   I have personally reviewed the following labs and imaging  studies CBC    Component Value Date/Time   WBC 8.7 10/04/2022 0435   RBC 5.32 10/04/2022 0435   HGB 15.0 10/04/2022 0435   HCT 46.5 10/04/2022 0435   PLT 255 10/04/2022 0435   MCV 87.4 10/04/2022 0435   MCH 28.2 10/04/2022 0435   MCHC 32.3 10/04/2022 0435   RDW 13.7 10/04/2022 0435   LYMPHSABS 1.4 10/04/2022 0435   MONOABS 0.4 10/04/2022 0435   EOSABS 0.0 10/04/2022 0435   BASOSABS 0.0 10/04/2022 0435      Latest Ref Rng & Units 10/04/2022    4:35 AM 04/23/2021    6:57 AM 04/22/2021    5:11 AM  BMP  Glucose 70 - 99 mg/dL 99  96  376   BUN 6 - 20 mg/dL 13  12  12    Creatinine 0.61 - 1.24 mg/dL  2.83  1.51   Sodium 135 - 145 mmol/L 142  138  137   Potassium 3.5 - 5.1 mmol/L 3.6  3.7  3.8   Chloride 98 - 111 mmol/L 109  105  104   CO2 22 - 32 mmol/L 25  26  28    Calcium 8.9 - 10.3 mg/dL 9.1  8.9  8.7     MR BRAIN WO CONTRAST  Result Date: 10/04/2022 CLINICAL DATA:  Stroke suspected EXAM: MRI HEAD WITHOUT CONTRAST TECHNIQUE: Multiplanar, multiecho pulse sequences of the brain and surrounding structures were obtained without intravenous contrast. COMPARISON:  Same day CT brain and head/neck angiogram. MRI Brain 05/21/2009 FINDINGS: Brain: Acute infarcts in the left ACA, left MCA, and likely the right PCA territory infarcts. There is a large infarct involving the left postcentral gyrus (series 5002, image 42). Additional infarcts are also seen involving the medial cortex of the left frontal lobe and lateral aspect of the left frontal lobe (series 5002, image 31). There is also a small infarct in the posterior right temporal lobe (series 5002, image 27). Redemonstrated periventricular and subcortical T2/FLAIR hyperintense signal, some of which appear triangular and perpendicular (series 14/06/2022, image 30), unchanged from prior. There is a focal region in the right occipital lobe where there is incomplete suppression of FLAIR signal (sereis 15001, image 28) with associated linear  susceptibility artifact (series 05/23/2009, image 27). Small amount of T2/FLAIR hyperintense signal is also seen at the root entry zone of the right trigeminal nerve (series 60737, image 16). No extra-axial fluid collection. No hydrocephalus. Vascular: Normal flow voids. Skull and upper cervical spine: Normal marrow signal. Sinuses/Orbits: Negative. Other: None. IMPRESSION: 1. Acute infarcts in the left ACA, left MCA, and likely the right PCA territory, with a large infarct involving the left postcentral gyrus. Given distribution, these are favored to represent infarcts related to a central embolic etiology. There is a focal region in the right occipital lobe where there is likely a small amount of subarachnoid blood products, which could suggest an underlying component of vasculitis. Recommend further evaluation with echocardiography. 2. Redemonstrated periventricular and subcortical T2/FLAIR hyperintense signal, as well as at the root  entry zone of the right trigeminal nerve, which are suggestive of an underlying demyelinating disease. Electronically Signed   By: Lorenza Cambridge M.D.   On: 10/04/2022 13:23   DG Abd 1 View  Result Date: 10/04/2022 CLINICAL DATA:  Screening for metallic foreign body EXAM: ABDOMEN - 1 VIEW COMPARISON:  None Available. FINDINGS: Nonobstructive bowel gas pattern. Contrast opacify the urinary bladder. Lung bases are clear. No metallic foreign body is visualized. No acute osseous abnormality. IMPRESSION: No metallic foreign body is visualized. Electronically Signed   By: Lorenza Cambridge M.D.   On: 10/04/2022 12:02   DG Chest 1 View  Result Date: 10/04/2022 CLINICAL DATA:  Stroke EXAM: CHEST  1 VIEW COMPARISON:  CXR 04/20/21, CXR 08/06/19 FINDINGS: No pleural effusion. No pneumothorax. No focal airspace opacity. Low lung volumes. There is a least mild interval increase in size of the cardiac contours compared to 04/20/21 even when accounting for differences in technique and lung volumes. No  focal airspace opacity. No metallic foreign body. Visualized upper abdomen is unremarkable. IMPRESSION: 1. No metallic foreign body. 2. There is a least mild interval increase in size of the cardiac contours compared to 04/20/21 even when accounting for differences in technique and lung volumes. Consider further evaluation with echocardiography. Electronically Signed   By: Lorenza Cambridge M.D.   On: 10/04/2022 11:59   CT ANGIO HEAD NECK W WO CM W PERF (CODE STROKE)  Result Date: 10/04/2022 CLINICAL DATA:  46 year old male code stroke presentation suspicious for Left hemisphere ELVO. EXAM: CT ANGIOGRAPHY HEAD AND NECK CT PERFUSION BRAIN TECHNIQUE: Multidetector CT imaging of the head and neck was performed using the standard protocol during bolus administration of intravenous contrast. Multiplanar CT image reconstructions and MIPs were obtained to evaluate the vascular anatomy. Carotid stenosis measurements (when applicable) are obtained utilizing NASCET criteria, using the distal internal carotid diameter as the denominator. Multiphase CT imaging of the brain was performed following IV bolus contrast injection. Subsequent parametric perfusion maps were calculated using RAPID software. RADIATION DOSE REDUCTION: This exam was performed according to the departmental dose-optimization program which includes automated exposure control, adjustment of the mA and/or kV according to patient size and/or use of iterative reconstruction technique. CONTRAST:  100 mL Omnipaque 350 COMPARISON:  Plain head CT 0430 hours. FINDINGS: CT Brain Perfusion Findings: ASPECTS: 10 CBF (<30%) Volume: 67mL Perfusion (Tmax>6.0s) volume: 38mL, however, some of this is bilateral and even registers in the brainstem. There is 12 mm of T-max > 10 S in the posterior left MCA territory. Hypoperfusion index 0.2. Mismatch Volume: Unclear. Infarction Location:Posterior left MCA territory abnormal CBF, abnormal CBV, and T-max > 10 S in an area of up to 12  mL. CTA NECK Skeleton: Scattered carious dentition. Age advanced lower cervical spine degeneration with bulky endplate osteophytosis. No acute osseous abnormality identified. Upper chest: Mild upper lung atelectasis. There is a left coronary artery stent in place. Small volume of dependent retained secretions in the trachea approaching the carina. But otherwise the visible major airways are patent. Other neck: No acute finding. Aortic arch: 3 vessel arch configuration.  No arch atherosclerosis. Right carotid system: Negative. Left carotid system: Negative. Vertebral arteries: Suboptimal but adequate cervical vertebral artery contrast timing. Proximal right subclavian and cervical right vertebral artery appear patent and negative. Proximal left subclavian and codominant cervical left vertebral artery appear patent and negative. CTA HEAD Posterior circulation: Fairly codominant distal vertebral arteries appear patent and within normal limits to the basilar. Right PICA origin appears patent.  Left AICA might be dominant, uncertain. Patent basilar artery without stenosis. SCA and PCA origins appear normal. Left posterior communicating artery is present, right is diminutive or absent. Bilateral PCA branches are within normal limits. Anterior circulation: Both ICA siphons are patent, with no siphon plaque or stenosis. Normal left posterior communicating artery. Patent carotid termini, MCA and ACA origins. Codominant A1 segments. Diminutive anterior communicating arteries suspected. Suboptimal intracranial contrast bolus. Allowing for this the ACA branches are within normal limits. Likewise, the right M1 segment, bifurcation, and right MCA branches are within normal limits. And the left MCA M1 segment also appears symmetric, patent to the trifurcation, and within normal limits. No left MCA branch occlusion is identified. Venous sinuses: Early contrast timing, grossly patent. Anatomic variants: None significant. Review of  the MIP images confirms the above findings Preliminary report of the above discussed by telephone Dr. Loleta Rose on 10/04/2022 at 0518 hours. IMPRESSION: 1. Suboptimal although adequate contrast bolus. Strong evidence on CTP of a small posterior Left MCA territory infarct core (approximately 12 mL). CTP also suggests approximately 60 mL of oligemia in the Left MCA territory. However, there is NO large vessel occlusion, and no discrete MCA branch occlusion detected on CTA. 2. No atherosclerosis, stenosis, or arterial abnormality identified in the head or neck. There is a left coronary artery stent visible in the upper chest. 3. Mild upper lung atelectasis and small volume retained secretions in the trachea. Salient findings discussed by telephone with Dr. Loleta Rose on 10/04/2022 at 0518 hours. Electronically Signed   By: Odessa Fleming M.D.   On: 10/04/2022 05:31   CT HEAD CODE STROKE WO CONTRAST  Addendum Date: 10/04/2022   ADDENDUM REPORT: 10/04/2022 04:51 ADDENDUM: Study discussed by telephone with Dr. Loleta Rose on 10/04/2022 at 0445 hours. Clinical picture is highly suspicious for Left MCA ELVO. Patient was undergoing initial tele neurology evaluation as we spoke. Electronically Signed   By: Odessa Fleming M.D.   On: 10/04/2022 04:51   Result Date: 10/04/2022 CLINICAL DATA:  Code stroke. 46 year old male with right side weakness and aphasia. EXAM: CT HEAD WITHOUT CONTRAST TECHNIQUE: Contiguous axial images were obtained from the base of the skull through the vertex without intravenous contrast. RADIATION DOSE REDUCTION: This exam was performed according to the departmental dose-optimization program which includes automated exposure control, adjustment of the mA and/or kV according to patient size and/or use of iterative reconstruction technique. COMPARISON:  Head CT 05/20/2009. FINDINGS: Brain: No acute intracranial hemorrhage identified. No midline shift, mass effect, or evidence of intracranial mass lesion. No  ventriculomegaly. Normal cerebral volume. No convincing cytotoxic edema. Mild asymmetric enlargement of the left lateral ventricle redemonstrated and normal variant. Vascular: Subtle if any asymmetric hyperdensity of left MCA branches in the sylvian fissure. Skull: No acute osseous abnormality identified. Sinuses/Orbits: Visualized paranasal sinuses and mastoids are stable and well aerated. Other: Leftward gaze. Visualized scalp soft tissues are within normal limits. ASPECTS [facility] Stroke Program Early CT Score) Total score (0-10 with 10 being normal): 10 IMPRESSION: Leftward gaze, but no acute cortically based infarct or intracranial hemorrhage identified. And subtle if any left MCA vessel asymmetry. ASPECTS 10. Electronically Signed: By: Odessa Fleming M.D. On: 10/04/2022 04:40    Disposition Plan & Communication  Patient status: Inpatient  Admitted From: Home Planned disposition location: Home health Anticipated discharge date: 12/12 pending clinical improvement and disposition planning  Family Communication: none    Author: Leeroy Bock, DO [facility] 10/05/2022, 7:36 AM   Available  by Epic secure chat 7AM-7PM. If 7PM-7AM, please contact night-coverage.  [facility] contact information found on [email or web address].

## 2022-10-06 DIAGNOSIS — F1490 Cocaine use, unspecified, uncomplicated: Secondary | ICD-10-CM

## 2022-10-06 HISTORY — DX: Cocaine use, unspecified, uncomplicated: F14.90

## 2022-10-06 LAB — ECHOCARDIOGRAM COMPLETE
AR max vel: 3.81 cm2
AV Area VTI: 3.5 cm2
AV Area mean vel: 3.61 cm2
AV Mean grad: 2 mmHg
AV Peak grad: 3.6 mmHg
Ao pk vel: 0.95 m/s
Area-P 1/2: 6.51 cm2
Calc EF: 13.2 %
Height: 75 in
MV M vel: 4.08 m/s
MV Peak grad: 66.6 mmHg
MV VTI: 2.78 cm2
Radius: 0.5 cm
S' Lateral: 6.25 cm
Single Plane A2C EF: 27.7 %
Single Plane A4C EF: 0.5 %
Weight: 3848.35 oz

## 2022-10-06 LAB — CBC
HCT: 49.1 % (ref 39.0–52.0)
Hemoglobin: 16.3 g/dL (ref 13.0–17.0)
MCH: 28.2 pg (ref 26.0–34.0)
MCHC: 33.2 g/dL (ref 30.0–36.0)
MCV: 84.9 fL (ref 80.0–100.0)
Platelets: 255 10*3/uL (ref 150–400)
RBC: 5.78 MIL/uL (ref 4.22–5.81)
RDW: 13.2 % (ref 11.5–15.5)
WBC: 7.9 10*3/uL (ref 4.0–10.5)
nRBC: 0 % (ref 0.0–0.2)

## 2022-10-06 LAB — COMPREHENSIVE METABOLIC PANEL
ALT: 16 U/L (ref 0–44)
AST: 26 U/L (ref 15–41)
Albumin: 3.6 g/dL (ref 3.5–5.0)
Alkaline Phosphatase: 48 U/L (ref 38–126)
Anion gap: 8 (ref 5–15)
BUN: 14 mg/dL (ref 6–20)
CO2: 23 mmol/L (ref 22–32)
Calcium: 9.2 mg/dL (ref 8.9–10.3)
Chloride: 108 mmol/L (ref 98–111)
Creatinine, Ser: 0.81 mg/dL (ref 0.61–1.24)
GFR, Estimated: >60 mL/min (ref >60)
Glucose, Bld: 119 mg/dL — ABNORMAL HIGH (ref 70–99)
Potassium: 3.6 mmol/L (ref 3.5–5.1)
Sodium: 139 mmol/L (ref 135–145)
Total Bilirubin: 1.1 mg/dL (ref 0.3–1.2)
Total Protein: 7.3 g/dL (ref 6.5–8.1)

## 2022-10-06 LAB — HEMOGLOBIN A1C
Hgb A1c MFr Bld: 5.4 % (ref 4.8–5.6)
Mean Plasma Glucose: 108 mg/dL

## 2022-10-06 NOTE — Evaluation (Signed)
Speech Language Pathology Evaluation and Treatment Patient Details Name: Ricardo L Fuhrer MRN: [id number] DOB: 21-Sep-1976 Today's Date: 10/06/2022 Time: 0175-1025 SLP Time Calculation (min) (ACUTE ONLY): 30 min  Problem List:  Patient Active Problem List   Diagnosis Date Noted   Cocaine use 10/06/2022   Expressive aphasia 10/05/2022   Right hemiplegia (HCC) 10/05/2022   CAD S/P percutaneous coronary angioplasty 10/05/2022   Primary hypertension 10/05/2022   Acute CVA (cerebrovascular accident) (HCC) 10/04/2022   CAD (coronary artery disease) 10/04/2022   Polysubstance abuse (HCC) 10/04/2022   Essential hypertension 04/22/2021   STEMI (ST elevation myocardial infarction) (HCC) 08/06/2019   Acute ST elevation myocardial infarction (STEMI) involving left anterior descending (LAD) coronary artery (HCC) 08/06/2019   Chest pain    NSTEMI (non-ST elevated myocardial infarction) (HCC) 05/25/2015   Elevated troponin    Ischemic chest pain (HCC)    Cocaine abuse (HCC)    Heavy smoker    Past Medical History:  Past Medical History:  Diagnosis Date   Multiple sclerosis (HCC)    a. question possible MS   Obesity    Polysubstance abuse (HCC)    a. tobacco, cocaine, crack, ecstasy, pills, "anything but injections"   Past Surgical History:  Past Surgical History:  Procedure Laterality Date   CARDIAC CATHETERIZATION N/A 05/28/2015   Procedure: Left Heart Cath and Coronary Angiography;  Surgeon: Antonieta Iba, MD;  Location: [facility] INVASIVE CV LAB;  Service: Cardiovascular;  Laterality: N/A;   CORONARY ANGIOGRAPHY N/A 04/22/2021   Procedure: CORONARY ANGIOGRAPHY;  Surgeon: Marcina Millard, MD;  Location: [facility] INVASIVE CV LAB;  Service: Cardiovascular;  Laterality: N/A;   CORONARY/GRAFT ACUTE MI REVASCULARIZATION N/A 08/06/2019   Procedure: Coronary/Graft Acute MI Revascularization;  Surgeon: Marcina Millard, MD;  Location: [facility] INVASIVE CV LAB;  Service: Cardiovascular;  Laterality:  N/A;   LEFT HEART CATH N/A 04/22/2021   Procedure: Left Heart Cath;  Surgeon: Marcina Millard, MD;  Location: [facility] INVASIVE CV LAB;  Service: Cardiovascular;  Laterality: N/A;   LEFT HEART CATH AND CORONARY ANGIOGRAPHY N/A 08/06/2019   Procedure: LEFT HEART CATH AND CORONARY ANGIOGRAPHY;  Surgeon: Marcina Millard, MD;  Location: [facility] INVASIVE CV LAB;  Service: Cardiovascular;  Laterality: N/A;   HPI:  Per admitting H&P "Most of the history was obtained from EMR.  Patient is aphasic and unable to provide any history  HPI: Ricardo Yang is a 46 y.o. male with medical history significant for prior CVA with no deficits, coronary artery disease with complications of ischemic cardiomyopathy with last known LVEF of 25 to 30% from a 2D echocardiogram which was done 06/23, hypertension and nicotine dependence who presented to the ER via EMS as a code stroke.  Patient was said to have gone to bed around 10 PM and was at his baseline.  His girlfriend woke up at around 4 AM and had a loud thud and found the patient on the floor.  Patient was unable to move the right side of his body and could not speak.  He was brought in emergently to the ER as a code stroke.  Teleneurology was consulted and patient deemed not a candidate for IV thrombolytics as he was outside the window.  CT angiogram showed no LVO and so no indication for acute NIR.  During my evaluation he is able to move his left upper and lower extremity but has right sided hemiparesis and remains aphasic but able to follow simple commands.  I am unable to do a review of systems  on this patient due to his underlying aphasia  CT scan of the head without contrast shows leftward gaze, but no acute cortically based infarct or intracranial hemorrhage identified. And subtle if any left MCA vessel asymmetry.  ASPECTS 10.  CT angiogram of the head and neck shows sub optimal although adequate contrast bolus.  Strong evidence on CTP of a small posterior Left MCA  territory infarct core (approximately 12 mL). CTP also suggests approximately 60 mL of oligemia in the Left MCA territory. However, there is NO large vessel occlusion, and no discrete MCA branch occlusion detected on CTA. No atherosclerosis, stenosis, or arterial abnormality identified  in the head or neck. There is a left coronary artery stent visible in the upper chest. Mild upper lung atelectasis and small volume retained secretions in the trachea.  Twelve-lead EKG reviewed by me shows sinus rhythm with Q waves in the lateral leads   Assessment / Plan / Recommendation Clinical Impression  Pt seen for speech/language evaluation and diet tolerance/clinical swallowing re-evaluation. Pt alert, pleasant, and cooperative. Markedly improved verbal output compared to yesterday. Pt upright in recliner. Fiance present.   Evaluation completed via informal means and portions of Western Aphasia Battery Revised - Bedside Record Form. Pt presents with intact primary receptive language ability. 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 Pt with intact confrontation naming and automatic speech. No s/sx anomia noted during controlled confines of assessment.   Pt given trials of solid (graham cracker) and thin liquid (~3 oz soda via straw). Pt with s/sx mild oral dysphagia c/b mildly prolonged mastication and mild oral residual with solids. Adequate oral clearance achieved with liquid wash which cleared residual appreciated in R lateral sulcus. Pharyngeal swallow appeared [facility] per clinical re-assessment. No overt s/sx pharyngeal dysphagia. Seemingly timely swallow initiation and seemingly adequate laryngeal elevation. No change to vocal quality across trials.   Recommend diet upgrade to regular diet with thin liquids and safe swallowing strategies/aspiration precautions as outlined below.   SLP to  f/u per POC for diet tolerance and speech/language tx.   Anticipate need for 24 hour supervision/assistance and post-acute SLP services for improved functional communication.   Pt, fiance, and RN made aware of results, recommendations, and SLP POC. Pt and fiance made aware of recommendation for intensive SLP services at d/c.  Pt verbalized understanding/agreement.    SLP Assessment  SLP Recommendation/Assessment: Patient needs continued Speech Lanaguage Pathology Services SLP Visit Diagnosis: Dysphagia, oral phase (R13.11);Cognitive communication deficit (R41.841)    Recommendations for follow up therapy are one component of a multi-disciplinary discharge planning process, led by the attending physician.  Recommendations may be updated based on patient status, additional functional criteria and insurance authorization.    Follow Up Recommendations  Acute inpatient rehab (3hours/day)    Assistance Recommended at Discharge  Frequent or constant Supervision/Assistance  Functional Status Assessment Patient has had a recent decline in their functional status and demonstrates the ability to make significant improvements in function in a reasonable and predictable amount of time.  Frequency and Duration min 3x week  2 weeks   SLP Plan   Continue with current plan of care      Recommendations for follow up therapy are one component of a multi-disciplinary discharge planning process, led by the attending physician.  Recommendations may be updated based on patient status, additional functional criteria and insurance authorization.     Recommendations  Diet recommendations: Regular diet Thin liquid Medication Administration: Crushed with puree Supervision: Patient able to self feed;Intermittent supervision to cue for compensatory strategies Compensations: Minimize environmental distractions;Slow rate;Small sips/bites;Follow solids with liquid Postural Changes and/or Swallow Maneuvers: Out of  bed for meals;Seated upright 90 degrees;Upright 30-60 min after meal          SLP Evaluation Cognition  Overall Cognitive Status:  (deferred due to speech/language evaluation) Arousal/Alertness: Awake/alert Orientation Level: Oriented X4       Comprehension  Auditory Comprehension Overall Auditory Comprehension: Appears within functional limits for tasks assessed Yes/No Questions: Within Functional Limits Commands: Within Functional Limits Conversation: Complex    Expression Expression Primary Mode of Expression: Verbal Verbal Expression Overall Verbal Expression: Impaired Initiation: No impairment Automatic Speech: Social Response (social automatics WFL) Level of Generative/Spontaneous Verbalization: Phrase Repetition: Impaired Level of Impairment: Phrase level;Sentence level Naming: No impairment (extra time) Pragmatics:  (reduced topic maintenence due to expressive difficulties)   Oral / Surveyor, quantity Overall Motor Speech: Impaired Respiration: Within functional limits Phonation: Normal Resonance: Within functional limits Articulation: Impaired (minimally imprecise) Intelligibility: Intelligible Motor Planning: Impaired Level of Impairment: Phrase Motor Speech Errors: Inconsistent;Groping for words;Aware           Cherrie Gauze, M.S., [facility] [phone] [place])   Quintella Baton 10/06/2022, 1:39 PM

## 2022-10-06 NOTE — Progress Notes (Signed)
PROGRESS NOTE  Ricardo Yang    DOB: 1976/02/23, 46 y.o.  TZG:[id number]    Code Status: Full Code   DOA: 10/04/2022   LOS: 2   Brief hospital course  Ricardo Yang is a 46 y.o. male with a PMH significant for prior CVA with no deficits, coronary artery disease with complications of ischemic cardiomyopathy with last known LVEF of 25 to 30% from a 2D echocardiogram which was done 06/23, hypertension and nicotine dependence.  They presented from home to the ED via EMS on 10/04/2022 with right sided weakness and global aphagia occurring overnight. Pt's girlfriend called EMS and he was transported to hospital with code stroke. Neurology evaluated on presentation.   In the ED, it was found that they had stable vital signs.  Significant findings included CT head-early changes in the left parietal region, CTA/P-multifocal areas of penumbra, more on the left than right with area of significantly increased cerebral blood flow (infarct) in the left parietal region.  He was not a candidate for intervention.  They were initially treated with supportive care.   Patient was admitted to medicine service for further workup and management of acute CVA as outlined in detail below.  10/06/22 -improving. Speech returned with delay  Assessment & Plan  Principal Problem:   Acute CVA (cerebrovascular accident) [facility]) Active Problems:   Essential hypertension   CAD (coronary artery disease)   Polysubstance abuse (HCC)   Expressive aphasia   Right hemiplegia (HCC)   CAD S/P percutaneous coronary angioplasty   Primary hypertension  Acute CVA- as seen on brain MRI. CT angiogram of the head and neck was negative for any LVO. Not an interventional candidate due to lack of LVO and timing since onset. Global aphagia on presentation and now speaking clearly with word delay and is improving - neurology following, appreciate recs - f/u echo done but not read. - ASA, plavix - PT/OT/SLP- recommending CIR. - TOC  engaged for rehab arrangement   Polysubstance abuse (HCC) Monitor closely for signs and symptoms of withdrawal   CAD  HTN- s/p PCI with stent angioplasty with complications of ischemic cardiomyopathy. Last 2D echocardiogram showed an LVEF of 25 to 30% from 06/23 - Continue aspirin and statins - Hold metoprolol and lisinopril for permissive hypertension due to acute stroke. Gradually add back starting tomorrow - treat >220 systolic   Body mass index is 75.91 kg/m.  VTE ppx: SCD's Start: 10/04/22 6384  Diet:     Diet   DIET DYS 3 Room service appropriate? Yes with Assist; Fluid consistency: Thin   Consultants: Neurology   Subjective 10/06/22    Pt reports feeling improved. Able to move right leg independently. Still no motor in right arm. Speech is improved. Denies pain, SOB. Questions and concerns addressed at time of encounter   Objective   Vitals:   10/05/22 1733 10/05/22 2033 10/06/22 0009 10/06/22 0353  BP: (!) 146/100 (!) 129/99 (!) 130/94 124/81  Pulse: 79 84 81 84  Resp: 18 19 19 16   Temp: 99 F (37.2 C) 98.5 F (36.9 C) (!) 97.2 F (36.2 C) 97.9 F (36.6 C)  TempSrc:  Oral Oral   SpO2: 99% 97% 99% 97%  Weight:      Height:       No intake or output data in the 24 hours ending 10/06/22 0743  Filed Weights   10/04/22 2042  Weight: 109.1 kg     Physical Exam:  General: awake, alert, NAD HEENT: atraumatic, clear  conjunctiva, anicteric sclera, MMM, hearing grossly normal Respiratory: normal respiratory effort. Cardiovascular: quick capillary refill, normal S1/S2, RRR, no JVD, murmurs Gastrointestinal: soft, NT, ND Nervous: A&O x3. Speech with clear phonation and only minor delay. No motor ability to right arm, sharp sensation intact. Left arm normal. Normal left leg sensation/strength. R leg decreased strength but has spontaneous motor activity.  Extremities: no edema, normal tone Skin: dry, intact, normal temperature, normal color. No rashes, lesions or  ulcers on exposed skin Psychiatry: normal mood, congruent affect  Labs   I have personally reviewed the following labs and imaging studies CBC    Component Value Date/Time   WBC 8.7 10/04/2022 0435   RBC 5.32 10/04/2022 0435   HGB 15.0 10/04/2022 0435   HCT 46.5 10/04/2022 0435   PLT 255 10/04/2022 0435   MCV 87.4 10/04/2022 0435   MCH 28.2 10/04/2022 0435   MCHC 32.3 10/04/2022 0435   RDW 13.7 10/04/2022 0435   LYMPHSABS 1.4 10/04/2022 0435   MONOABS 0.4 10/04/2022 0435   EOSABS 0.0 10/04/2022 0435   BASOSABS 0.0 10/04/2022 0435      Latest Ref Rng & Units 10/04/2022    4:35 AM 04/23/2021    6:57 AM 04/22/2021    5:11 AM  BMP  Glucose 70 - 99 mg/dL 99  96  707   BUN 6 - 20 mg/dL 13  12  12    Creatinine 0.61 - 1.24 mg/dL  8.67  5.44   Sodium 135 - 145 mmol/L 142  138  137   Potassium 3.5 - 5.1 mmol/L 3.6  3.7  3.8   Chloride 98 - 111 mmol/L 109  105  104   CO2 22 - 32 mmol/L 25  26  28    Calcium 8.9 - 10.3 mg/dL 9.1  8.9  8.7     MR BRAIN WO CONTRAST  Result Date: 10/04/2022 CLINICAL DATA:  Stroke suspected EXAM: MRI HEAD WITHOUT CONTRAST TECHNIQUE: Multiplanar, multiecho pulse sequences of the brain and surrounding structures were obtained without intravenous contrast. COMPARISON:  Same day CT brain and head/neck angiogram. MRI Brain 05/21/2009 FINDINGS: Brain: Acute infarcts in the left ACA, left MCA, and likely the right PCA territory infarcts. There is a large infarct involving the left postcentral gyrus (series 5002, image 42). Additional infarcts are also seen involving the medial cortex of the left frontal lobe and lateral aspect of the left frontal lobe (series 5002, image 31). There is also a small infarct in the posterior right temporal lobe (series 5002, image 27). Redemonstrated periventricular and subcortical T2/FLAIR hyperintense signal, some of which appear triangular and perpendicular (series 14/06/2022, image 30), unchanged from prior. There is a focal region in  the right occipital lobe where there is incomplete suppression of FLAIR signal (sereis 15001, image 28) with associated linear susceptibility artifact (series 05/23/2009, image 27). Small amount of T2/FLAIR hyperintense signal is also seen at the root entry zone of the right trigeminal nerve (series 10071, image 16). No extra-axial fluid collection. No hydrocephalus. Vascular: Normal flow voids. Skull and upper cervical spine: Normal marrow signal. Sinuses/Orbits: Negative. Other: None. IMPRESSION: 1. Acute infarcts in the left ACA, left MCA, and likely the right PCA territory, with a large infarct involving the left postcentral gyrus. Given distribution, these are favored to represent infarcts related to a central embolic etiology. There is a focal region in the right occipital lobe where there is likely a small amount of subarachnoid blood products, which could suggest an underlying component of  vasculitis. Recommend further evaluation with echocardiography. 2. Redemonstrated periventricular and subcortical T2/FLAIR hyperintense signal, as well as at the root entry zone of the right trigeminal nerve, which are suggestive of an underlying demyelinating disease. Electronically Signed   By: Lorenza Cambridge M.D.   On: 10/04/2022 13:23   DG Abd 1 View  Result Date: 10/04/2022 CLINICAL DATA:  Screening for metallic foreign body EXAM: ABDOMEN - 1 VIEW COMPARISON:  None Available. FINDINGS: Nonobstructive bowel gas pattern. Contrast opacify the urinary bladder. Lung bases are clear. No metallic foreign body is visualized. No acute osseous abnormality. IMPRESSION: No metallic foreign body is visualized. Electronically Signed   By: Lorenza Cambridge M.D.   On: 10/04/2022 12:02   DG Chest 1 View  Result Date: 10/04/2022 CLINICAL DATA:  Stroke EXAM: CHEST  1 VIEW COMPARISON:  CXR 04/20/21, CXR 08/06/19 FINDINGS: No pleural effusion. No pneumothorax. No focal airspace opacity. Low lung volumes. There is a least mild interval  increase in size of the cardiac contours compared to 04/20/21 even when accounting for differences in technique and lung volumes. No focal airspace opacity. No metallic foreign body. Visualized upper abdomen is unremarkable. IMPRESSION: 1. No metallic foreign body. 2. There is a least mild interval increase in size of the cardiac contours compared to 04/20/21 even when accounting for differences in technique and lung volumes. Consider further evaluation with echocardiography. Electronically Signed   By: Lorenza Cambridge M.D.   On: 10/04/2022 11:59    Disposition Plan & Communication  Patient status: Inpatient  Admitted From: Home Planned disposition location: CIR Anticipated discharge date: 12/12 pending clinical improvement and disposition planning. Neurology clearance  Family Communication: none    Author: Leeroy Bock, DO [facility] 10/06/2022, 7:43 AM   Available by Epic secure chat 7AM-7PM. If 7PM-7AM, please contact night-coverage.  [facility] contact information found on [email or web address].

## 2022-10-06 NOTE — TOC Progression Note (Addendum)
Transition of Care [facility]) - Progression Note    Patient Details  Name: Ricardo Yang MRN: [id number] Date of Birth: 1976/07/29  Transition of Care [facility]) CM/SW Contact  Truddie Hidden, RN Phone Number: 10/06/2022, 12:15 PM  Clinical Narrative:    Patient referral for CIR sent 12/10. Screening still being processed.         Expected Discharge Plan and Services                                                 Social Determinants of Health (SDOH) Interventions    Readmission Risk Interventions     No data to display

## 2022-10-06 NOTE — Progress Notes (Signed)
Occupational Therapy Treatment Patient Details Name: Ricardo Yang MRN: [id number] DOB: Apr 02, 1976 Today's Date: 10/06/2022   History of present illness Ricardo Yang is a 46 y.o. male who was last in his normal state of health at 2200 prior to going to bed.  He presented with severe aphasia and right-sided weakness and was evaluated as an extended window code stroke this morning given his significant symptoms.   OT comments  Ricardo Yang was seen for OT treatment on this date. Upon arrival to room pt reclined in bed, agreeable to tx. Pt completes supine place and hold exercises with focus on RUE - maintains wrist in neutral at gravity eliminated position. Completed place and hold elbow exercises - maintains 90* from supine for ~30" when pt not attending to RUE (distracted by conversation with spouse). Pt exits bed with MIN A, assist for RUE support, good recall of hooking method for RLE. Fair static sitting balance and awareness of RUE. Educated on joint protection strategies and HEP (exercises completed as described below).   Pt requires MOD A sit<>stand at elevated bed height, focus on lateral weight shifting. Advances R foot one step, attempted L foot with R knee buckling, assisted into chair. Unable to rise from chair height with x1 assist, squat pivot chair>bed. Second standing trial from bed height, MOD A step pivot bed>chair, achieves 1 step each L and R foot prior to sitting in chair. SETUP self-feeding using nondominant LUE, TOTAL A self-drinking using dominant RUE - hand over hand assist. Pt making good progress toward goals, will continue to follow POC. Pt remains excellent candidate for CIR.     Recommendations for follow up therapy are one component of a multi-disciplinary discharge planning process, led by the attending physician.  Recommendations may be updated based on patient status, additional functional criteria and insurance authorization.    Follow Up Recommendations  Acute  inpatient rehab (3hours/day)     Assistance Recommended at Discharge Frequent or constant Supervision/Assistance  Patient can return home with the following  Two people to help with walking and/or transfers;Two people to help with bathing/dressing/bathroom   Equipment Recommendations  Other (comment) (defer)    Recommendations for Other Services      Precautions / Restrictions Precautions Precautions: Shoulder;Fall Type of Shoulder Precautions: Patient does not have shoulder subluxation at this time, however has flaccid RUE, support shoulder joint appropriately. Restrictions Weight Bearing Restrictions: No       Mobility Bed Mobility Overal bed mobility: Needs Assistance Bed Mobility: Supine to Sit     Supine to sit: Min assist     General bed mobility comments: assist for trunk only, use of L foot to hook R foot    Transfers Overall transfer level: Needs assistance Equipment used: Rolling walker (2 wheels) Transfers: Sit to/from Stand, Bed to chair/wheelchair/BSC Sit to Stand: From elevated surface, Mod assist   Squat pivot transfers: Mod assist Step pivot transfers: Mod assist, From elevated surface     General transfer comment: R knee block     Balance Overall balance assessment: Needs assistance Sitting-balance support: Feet supported Sitting balance-Leahy Scale: Good     Standing balance support: Single extremity supported Standing balance-Leahy Scale: Fair                             ADL either performed or assessed with clinical judgement   ADL Overall ADL's : Needs assistance/impaired  General ADL Comments: SETUP self-feeding using nondominant LUE, TOTAL A self-drinking using dominant RUE - hand over hand assist. MOD A simulated BSC t/f, tolerated squat pivot and step pivot t/f x2.      Cognition Arousal/Alertness: Awake/alert Behavior During Therapy: WFL for tasks  assessed/performed Overall Cognitive Status: Within Functional Limits for tasks assessed                                          Exercises Exercises: General Lower Extremity General Exercises - Lower Extremity Ankle Circles/Pumps: AROM, Strengthening, Both, 5 reps, Seated Quad Sets: AROM, Strengthening, Both, 5 reps, Seated Gluteal Sets: AROM, Strengthening, Both, 5 reps, Seated Hip ABduction/ADduction: AROM, Strengthening, Both, 5 reps, Seated Hip Flexion/Marching: AROM, Strengthening, Both, 5 reps, Seated Other Exercises Other Exercises: place and hold exercises with RUE, maintains wrist in neutral with gravity eliminated. Place and hold elbow exercises with success when pt not attending to RUE - maintains 90* from supine for ~30"     Pertinent Vitals/ Pain       Pain Assessment Pain Assessment: No/denies pain   Frequency  Min 5X/week        Progress Toward Goals  OT Goals(current goals can now be found in the care plan section)  Progress towards OT goals: Progressing toward goals  Acute Rehab OT Goals Patient Stated Goal: to go to inpatient rehab OT Goal Formulation: With patient/family Time For Goal Achievement: 10/18/22 Potential to Achieve Goals: Good ADL Goals Pt Will Perform Grooming: sitting;with set-up Pt Will Perform Lower Body Dressing: with min assist;sitting/lateral leans Pt Will Transfer to Toilet: bedside commode;stand pivot transfer;with mod assist Pt Will Perform Toileting - Clothing Manipulation and hygiene: with min assist;sitting/lateral leans Pt/caregiver will Perform Home Exercise Program: Increased ROM;Increased strength;Right Upper extremity;With written HEP provided  Plan Discharge plan remains appropriate;Frequency needs to be updated    Co-evaluation                 AM-PAC OT "6 Clicks" Daily Activity     Outcome Measure   Help from another person eating meals?: A Little Help from another person taking care of  personal grooming?: A Little Help from another person toileting, which includes using toliet, bedpan, or urinal?: A Lot Help from another person bathing (including washing, rinsing, drying)?: A Lot Help from another person to put on and taking off regular upper body clothing?: A Lot Help from another person to put on and taking off regular lower body clothing?: A Lot 6 Click Score: 14    End of Session    OT Visit Diagnosis: Other abnormalities of gait and mobility (R26.89);Muscle weakness (generalized) (M62.81);Pain;Hemiplegia and hemiparesis Hemiplegia - Right/Left: Right Hemiplegia - dominant/non-dominant: Dominant Hemiplegia - caused by: Cerebral infarction   Activity Tolerance Patient tolerated treatment well   Patient Left in chair;with call bell/phone within reach;with chair alarm set;with family/visitor present   Nurse Communication          Time: 1478-2956 OT Time Calculation (min): 39 min  Charges: OT General Charges $OT Visit: 1 Visit OT Treatments $Self Care/Home Management : 8-22 mins $Neuromuscular Re-education: 8-22 mins $Therapeutic Exercise: 8-22 mins  Kathie Dike, M.S. OTR/L  10/06/22, 10:56 AM  ascom [phone]

## 2022-10-06 NOTE — Progress Notes (Signed)
Subjective: Exam stable from yesterday, no new complaints.  Exam: Vitals:   10/06/22 0806 10/06/22 1138  BP: (!) 144/87 117/79  Pulse: 74 83  Resp: 18 19  Temp: 98 F (36.7 C) 98.2 F (36.8 C)  SpO2: 98% 100%   Gen: In bed, NAD Resp: non-labored breathing, no acute distress Abd: soft, nt  Neuro: MS: Awake, alert, oriented x 3.  He continues to have expressive aphasia but markedly improved from yesterday. CN: He is able to count fingers in all visual fields, right facial weakness Motor: He continues to have severe right lower arm weakness, 3/5 right leg weakness, 5/5 on left Sensory: Diminished in the right side   Pertinent Labs: UDS positive for cocaine  Impression: 46 year old male with left MCA territory infarct.  My suspicion is that he had proximal occlusion that spontaneously recanalized.  Luckily,  it was a subtotal infarct and his exam is improving.  I think that cocaine likely played a significant role here and vasospasm may be the etiology.  That being said, given a 46 year old with an embolic appearing stroke if TTE is negative, it would be worthwhile to consider TEE and prolonged cardiac monitoring.  Recommendations: 1) LDL 108, agree with atorvastatin 2) continue home aspirin and Plavix 3) follow-up TTE. If neg will need TEE 4) advise cocaine cessation 5) neurology will continue to follow  Bing Neighbors, MD [facility] [phone]  If 7pm- 7am, please page neurology on call as listed in AMION.

## 2022-10-06 NOTE — Progress Notes (Signed)
Inpatient Rehab Admissions Coordinator:  ? ?Per therapy recommendations,  patient was screened for CIR candidacy by Lauriann Milillo, MS, CCC-SLP. At this time, Pt. Appears to be a a potential candidate for CIR. I will place   order for rehab consult per protocol for full assessment. Please contact me any with questions. ? ?Terez Montee, MS, CCC-SLP ?Rehab Admissions Coordinator  ?[phone] (celll) ?[phone] (office) ? ?

## 2022-10-06 NOTE — Progress Notes (Signed)
Physical Therapy Treatment Patient Details Name: Ricardo Yang MRN: [id number] DOB: 18-Jul-1976 Today's Date: 10/06/2022   History of Present Illness Ricardo L Wisser is a 46 y.o. male who was last in his normal state of health at 2200 prior to going to bed.  He presented with severe aphasia and right-sided weakness and was evaluated as an extended window code stroke this morning given his significant symptoms.    PT Comments    Pt seen for PT tx with pt agreeable. Session focused on RLE NMR, standing balance, strengthening, activity tolerance through transfers & gait at rail in hallway. Pt is able to complete STS from low recliner with mod assist +2 & ambulate at rail x 3 trials (uses LUE HHA when rail not available 2/2 short length) with cuing for upright posture. Pt with occasional R knee buckling but demonstrates good awareness of need for seated rest breaks 2/2 fatigue. Pt remains an excellent CIR candidate; anticipate pt can tolerate 3 hours of therapy at this time.    Recommendations for follow up therapy are one component of a multi-disciplinary discharge planning process, led by the attending physician.  Recommendations may be updated based on patient status, additional functional criteria and insurance authorization.  Follow Up Recommendations  Acute inpatient rehab (3hours/day)     Assistance Recommended at Discharge Frequent or constant Supervision/Assistance  Patient can return home with the following Two people to help with walking and/or transfers;Two people to help with bathing/dressing/bathroom;Assist for transportation;Help with stairs or ramp for entrance;Assistance with cooking/housework   Equipment Recommendations   (TBD in next venue)    Recommendations for Other Services       Precautions / Restrictions Precautions Precautions: Shoulder;Fall Type of Shoulder Precautions: Patient does not have shoulder subluxation at this time, however has flaccid RUE, support shoulder  joint appropriately. Restrictions Weight Bearing Restrictions: No     Mobility  Bed Mobility                    Transfers Overall transfer level: Needs assistance   Transfers: Sit to/from Stand Sit to Stand: Mod assist, +2 physical assistance, +2 safety/equipment (STS from recliner with mod assist +2 x ~3 times, pt able to push to standing from armrest & transition LUE to rail in hallway)                Ambulation/Gait Ambulation/Gait assistance: Mod assist, +2 physical assistance, +2 safety/equipment Gait Distance (Feet):  (25 ft + 22 ft + 20 ft) Assistive device:  (L rail in hallway, transitioning to LUE HHA when rail runs out)   Gait velocity: decreased     General Gait Details: Pt with fluctuating step through<>step to pattern, pt with improving ability to advance RLE with CGA<>min assist, PT provides approximation<>tactile cuing at R knee to prevent buckling although this does happen ~3 times total throughout all walking trials   Stairs             Wheelchair Mobility    Modified Rankin (Stroke Patients Only)       Balance Overall balance assessment: Needs assistance Sitting-balance support: Feet supported Sitting balance-Leahy Scale: Good     Standing balance support: Single extremity supported, During functional activity Standing balance-Leahy Scale: Poor                              Cognition Arousal/Alertness: Awake/alert Behavior During Therapy: WFL for tasks assessed/performed Overall Cognitive Status: Within  Functional Limits for tasks assessed                                 General Comments: Pt with improving conversation/verbal communication, occasionally does require extra time to initiate words        Exercises      General Comments        Pertinent Vitals/Pain Pain Assessment Pain Assessment: No/denies pain    Home Living                          Prior Function             PT Goals (current goals can now be found in the care plan section) Acute Rehab PT Goals Patient Stated Goal: Pt reporting yes to wanting to regain independence. PT Goal Formulation: With patient Time For Goal Achievement: 10/18/22 Potential to Achieve Goals: Good Progress towards PT goals: Progressing toward goals    Frequency    7X/week      PT Plan Current plan remains appropriate    Co-evaluation              AM-PAC PT "6 Clicks" Mobility   Outcome Measure  Help needed turning from your back to your side while in a flat bed without using bedrails?: A Little Help needed moving from lying on your back to sitting on the side of a flat bed without using bedrails?: A Lot Help needed moving to and from a bed to a chair (including a wheelchair)?: A Lot Help needed standing up from a chair using your arms (e.g., wheelchair or bedside chair)?: A Lot Help needed to walk in hospital room?: Total Help needed climbing 3-5 steps with a railing? : Total 6 Click Score: 11    End of Session Equipment Utilized During Treatment: Gait belt Activity Tolerance: Patient tolerated treatment well Patient left: in chair;with chair alarm set;with call bell/phone within reach;with family/visitor present Nurse Communication: Mobility status PT Visit Diagnosis: Muscle weakness (generalized) (M62.81);Difficulty in walking, not elsewhere classified (R26.2);Hemiplegia and hemiparesis;Unsteadiness on feet (R26.81);Other abnormalities of gait and mobility (R26.89) Hemiplegia - Right/Left: Right Hemiplegia - dominant/non-dominant: Dominant Hemiplegia - caused by: Cerebral infarction     Time: 1006-1029 PT Time Calculation (min) (ACUTE ONLY): 23 min  Charges:  $Therapeutic Activity: 8-22 mins $Neuromuscular Re-education: 8-22 mins                     Aleda Grana, PT, DPT 10/06/22, 10:39 AM   Sandi Mariscal 10/06/2022, 10:38 AM

## 2022-10-06 NOTE — Progress Notes (Signed)
Inpatient Rehab Coordinator Note:  I met with patient and fiance, Ricardo Yang, at bedside to discuss CIR recommendations and goals/expectations of CIR stay.  We reviewed 3 hrs/day of therapy, physician follow up, and average length of stay 2 weeks (dependent upon progress) with goals of mod I/min A. Patient interested and fiance will be available 24/7. Will continue to follow for potential admission to CIR.   Rehab Admissons Coordinator [place], [place], [place] [phone]

## 2022-10-07 DIAGNOSIS — I5020 Unspecified systolic (congestive) heart failure: Secondary | ICD-10-CM

## 2022-10-07 DIAGNOSIS — Z0189 Encounter for other specified special examinations: Secondary | ICD-10-CM

## 2022-10-07 DIAGNOSIS — I639 Cerebral infarction, unspecified: Secondary | ICD-10-CM

## 2022-10-07 HISTORY — DX: Cerebral infarction, unspecified: I63.9

## 2022-10-07 HISTORY — DX: Encounter for other specified special examinations: Z01.89

## 2022-10-07 LAB — TROPONIN I (HIGH SENSITIVITY): Troponin I (High Sensitivity): 265 ng/L (ref <18)

## 2022-10-07 MED ORDER — METOPROLOL SUCCINATE ER 25 MG PO TB24
12.5000 mg | ORAL_TABLET | Freq: Every day | ORAL | Status: DC
  Administered 2022-10-07: 12.5 mg via ORAL
  Filled 2022-10-07: qty 1

## 2022-10-07 MED ORDER — TRAMADOL HCL 50 MG PO TABS
50.0000 mg | ORAL_TABLET | Freq: Once | ORAL | Status: AC
  Administered 2022-10-07: 50 mg via ORAL
  Filled 2022-10-07: qty 1

## 2022-10-07 MED ORDER — SIMETHICONE 80 MG PO CHEW
80.0000 mg | CHEWABLE_TABLET | Freq: Once | ORAL | Status: AC
  Administered 2022-10-07: 80 mg via ORAL
  Filled 2022-10-07: qty 1

## 2022-10-07 NOTE — Progress Notes (Signed)
Subjective: Aphasia and leg weakness improving, arm weakness still profound  No new complaints   Exam: Vitals:   10/07/22 0751 10/07/22 1152  BP: (!) 121/93 120/87  Pulse: 82 86  Resp: 19 18  Temp: 98.4 F (36.9 C) 97.9 F (36.6 C)  SpO2: 98% 99%   Gen: In bed, NAD Resp: non-labored breathing, no acute distress Abd: soft, nt  Neuro: MS: Awake, alert, oriented x 3. Now only mild aphasia with difficulty repeating complex sentences (able to do this when he slows down and concentrates)  CN: He is able to count fingers in all visual fields, right facial weakness Motor: He continues to have severe right lower arm weakness but does have some movement distally 1/5, 4/5 right hip flexion otherwise 5/5 on the RLE Sensory: Diminished in the right side in the arm but not the leg   Pertinent Labs: UDS positive for cocaine  Lab Results  Component Value Date   CHOL 155 10/05/2022   HDL 36 (L) 10/05/2022   LDLCALC 108 (H) 10/05/2022   TRIG 57 10/05/2022   CHOLHDL 4.3 10/05/2022   Lab Results  Component Value Date   HGBA1C 5.4 10/04/2022   MRI brain personally reviewed, agree with radiology:    1. Acute infarcts in the left ACA, left MCA, and likely the right PCA territory, with a large infarct involving the left postcentral gyrus. Given distribution, these are favored to represent infarcts related to a central embolic etiology. There is a focal region in the right occipital lobe where there is likely a small amount of subarachnoid blood products, which could suggest an underlying component of vasculitis. Recommend further evaluation with echocardiography. 2. Redemonstrated periventricular and subcortical T2/FLAIR hyperintense signal, as well as at the root entry zone of the right trigeminal nerve, which are suggestive of an underlying demyelinating disease.  CTA/CTP: 1. Suboptimal although adequate contrast bolus. Strong evidence on CTP of a small posterior Left MCA  territory infarct core (approximately 12 mL). CTP also suggests approximately 60 mL of oligemia in the Left MCA territory. However, there is NO large vessel occlusion, and no discrete MCA branch occlusion detected on CTA. 2. No atherosclerosis, stenosis, or arterial abnormality identified in the head or neck. There is a left coronary artery stent visible in the upper chest. 3. Mild upper lung atelectasis and small volume retained secretions in the trachea.  ECHO 10/05/2022  Left Ventricle: Left ventricular ejection fraction, by estimation, is  <20%. The left ventricle has severely decreased function. The left  ventricle demonstrates global hypokinesis. Definity contrast agent was  given IV to delineate the left ventricular  endocardial borders. The left ventricular internal cavity size was  severely dilated. There is no left ventricular hypertrophy. Left  ventricular diastolic function could not be evaluated.  Right Ventricle: The right ventricular size is mildly enlarged. Mildly  increased right ventricular wall thickness. Right ventricular systolic  function is mildly reduced. There is normal pulmonary artery systolic  pressure. The tricuspid regurgitant  velocity is 2.25 m/s, and with an assumed right atrial pressure of 3 mmHg,  the estimated right ventricular systolic pressure is 23.2 mmHg.  Left Atrium: Left atrial size was mildly dilated.  Right Atrium: Right atrial size was mildly dilated.   TEE 10/08/2022 No evidence for thrombus or cardiac etiology for stroke. Severely reduced EF. Cessation of cocaine use advised.  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  My suspicion is that he had proximal occlusion that spontaneously recanalized.  Luckily,  it was a subtotal infarct and his exam is improving.  I think that cocaine likely played a significant role here and vasospasm may be the etiology; although with an EF of less than 20%, cardioembolic is a strong  possibility as well. Appreciate cardiology following, hope EF may improve with medical optimization and substance use cessation.   Recommendations: - LDL 108, agree with atorvastatin - continue home aspirin and Plavix for at least 21 days from neurology perspective, long term if indicated per cardiology (life long ASA 81 mg daily at minimum from neuro perspective) - TEE negative, no strong indication for [facility] at this time - continued to advise cocaine and tobacco cessation - neurology will be available as needed going forward please reach out if new questions or concerns arise.   Brooke Dare MD-PhD [facility] [phone] [facility] coverage for [facility] is from 8 AM to 4 AM in-house and 4 PM to 8 PM by telephone/video. 8 PM to 8 AM emergent questions or overnight urgent questions should be addressed to Teleneurology On-call or [facility] neurohospitalist; contact information can be found on AMION  Greater than 35 minutes spent in care of this patient today, the majority at bedside

## 2022-10-07 NOTE — Consult Note (Signed)
[facility] CARDIOLOGY CONSULT NOTE       Patient ID: Ricardo L Kluesner MRN: [id number] DOB/AGE: 05-27-1976 46 y.o.  Admit date: 10/04/2022 Referring Physician Dr. Jamelle Rushing Primary Physician  Primary Cardiologist Dr. Darrold Junker (last seen during 2022 admission) Reason for Consultation reduced EF   HPI: Ricardo L. Santy is a 46yoM with a PMH of anterior STEMI s/p PCI with DES prox LAD (07/2019), medically managed NSTEMI w/ LHC patent prox LAD stent, 100% sm-mod caliber OM2, 50% mid RCA, ost - prox LAD 20% (03/2021), HFrEF (<20%, global hypo 09/2022, prev 25-30% 03/2021), obesity, polysubstance abuse (cocaine, tobacco), hx CVA who presented to [facility] ED 10/04/22 with right sided hemiplegia and expressive aphasia. Code Stroke activated in the ED, no LVO on CT. He did not receive thrombolytics. CTA head and neck with left MCA territory infarct. Cardiology is consulted on hospital day 3 for assistance with his HF and to arrange TEE.   The patient presents with his fiance who contributes to the history. He presented to [facility] via EMS as a code stroke with right-sided hemiparesis and significant expressive aphasia.  CTA head and neck showed a left MCA territory infarct without LVO, he did not receive thrombolytics.  TTE this admission revealed a reduced EF less than 20% with global hypokinesis, down from 25-30% in June 2022.  At my time of evaluation his speech is slightly slurred but overall easy to understand and much improved.  He has some residual right upper extremity weakness.  From a cardiovascular perspective, he has not been taking any prescription medications recently due to financial constraints and has not been seen by PCP or cardiologist on an outpatient basis.  Following discharge from his admission in June 2022 he was prescribed aspirin, clopidogrel, atorvastatin 80 mg, lisinopril 2.5 mg, and metoprolol XL 25 mg.  Over the past several months he has felt somewhat short of breath with minimal  exertion, noted chronic 2-3 pillow orthopnea and occasional apneic episodes at night per his fiance.  He has denied any recent chest pain or exertional angina.  He occasionally can feel his heart racing with transient episodes that are self terminating.  Notes occasional right lower extremity swelling that appears to be at baseline today.  Denies dizziness, presyncope.  He currently smokes 1/2 PPD, previously smoked 2 PPD. Started smoking 35 years ago. Ongoing use of cocaine.   Recent vitals are notable for blood pressure of 120/87, heart rate in the 70s to 80s in sinus rhythm on telemetry.  He is comfortable on room air.  Labs are notable for potassium 3.6, BUN/creatinine 14/0.1 and GFR greater than 60.  TC 155, HDL 36, LDL above goal at 409, triglycerides 57.  H&H 16/49 and platelets 255.  Hemoglobin A1c is 5.4%.   Review of systems complete and found to be negative unless listed above     Past Medical History:  Diagnosis Date   Multiple sclerosis (HCC)    a. question possible MS   Obesity    Polysubstance abuse (HCC)    a. tobacco, cocaine, crack, ecstasy, pills, "anything but injections"    Past Surgical History:  Procedure Laterality Date   CARDIAC CATHETERIZATION N/A 05/28/2015   Procedure: Left Heart Cath and Coronary Angiography;  Surgeon: Antonieta Iba, MD;  Location: [facility] INVASIVE CV LAB;  Service: Cardiovascular;  Laterality: N/A;   CORONARY ANGIOGRAPHY N/A 04/22/2021   Procedure: CORONARY ANGIOGRAPHY;  Surgeon: Marcina Millard, MD;  Location: [facility] INVASIVE CV LAB;  Service: Cardiovascular;  Laterality:  N/A;   CORONARY/GRAFT ACUTE MI REVASCULARIZATION N/A 08/06/2019   Procedure: Coronary/Graft Acute MI Revascularization;  Surgeon: Marcina Millard, MD;  Location: [facility] INVASIVE CV LAB;  Service: Cardiovascular;  Laterality: N/A;   LEFT HEART CATH N/A 04/22/2021   Procedure: Left Heart Cath;  Surgeon: Marcina Millard, MD;  Location: [facility] INVASIVE CV LAB;  Service:  Cardiovascular;  Laterality: N/A;   LEFT HEART CATH AND CORONARY ANGIOGRAPHY N/A 08/06/2019   Procedure: LEFT HEART CATH AND CORONARY ANGIOGRAPHY;  Surgeon: Marcina Millard, MD;  Location: [facility] INVASIVE CV LAB;  Service: Cardiovascular;  Laterality: N/A;    Medications Prior to Admission  Medication Sig Dispense Refill Last Dose   aspirin 81 MG chewable tablet Chew 1 tablet (81 mg total) by mouth daily. (Patient not taking: Reported on 10/04/2022) 30 tablet 0 Not Taking   atorvastatin (LIPITOR) 80 MG tablet Take 1 tablet (80 mg total) by mouth daily at 6 PM. (Patient not taking: Reported on 10/04/2022) 30 tablet 0 Not Taking   clopidogrel (PLAVIX) 75 MG tablet Take 1 tablet (75 mg total) by mouth daily with breakfast. (Patient not taking: Reported on 10/04/2022) 30 tablet 0 Not Taking   lisinopril (ZESTRIL) 5 MG tablet Take (1/2) tablet (2.5 mg total) by mouth daily. (Patient not taking: Reported on 10/04/2022) 15 tablet 0 Not Taking   metoprolol succinate (TOPROL-XL) 25 MG 24 hr tablet Take 1 tablet (25 mg total) by mouth daily. Take with or immediately following a meal. (Patient not taking: Reported on 10/04/2022) 30 tablet 0 Not Taking   naproxen (NAPROSYN) 500 MG tablet Take 1 tablet (500 mg total) by mouth 2 (two) times daily with a meal. (Patient not taking: Reported on 10/04/2022) 30 tablet 0 Not Taking   traMADol (ULTRAM) 50 MG tablet Take 1 tablet (50 mg total) by mouth every 6 (six) hours as needed. (Patient not taking: Reported on 10/04/2022) 12 tablet 0 Not Taking   Social History   Socioeconomic History   Marital status: Single    Spouse name: Not on file   Number of children: Not on file   Years of education: Not on file   Highest education level: Not on file  Occupational History   Not on file  Tobacco Use   Smoking status: Every Day   Smokeless tobacco: Never  Substance and Sexual Activity   Alcohol use: No    Alcohol/week: 0.0 standard drinks of alcohol   Drug use: Yes     Frequency: 1.0 times per week    Types: "Crack" cocaine, Amphetamines, Cocaine, Hashish, Marijuana, MDMA (Ecstacy)   Sexual activity: Yes    Partners: Female  Other Topics Concern   Not on file  Social History Narrative   Not on file   Social Determinants of Health   Financial Resource Strain: Not on file  Food Insecurity: Not on file  Transportation Needs: Not on file  Physical Activity: Not on file  Stress: Not on file  Social Connections: Not on file  Intimate Partner Violence: Not on file    Family History  Problem Relation Age of Onset   Heart disease Father       Intake/Output Summary (Last 24 hours) at 10/07/2022 1013 Last data filed at 10/07/2022 0400 Gross per 24 hour  Intake 1440 ml  Output 3225 ml  Net -1785 ml    Vitals:   10/06/22 1953 10/07/22 0028 10/07/22 0446 10/07/22 0751  BP: 129/87 116/78 (!) 132/92 (!) 121/93  Pulse: 82 82 83 82  Resp: Temp: 98.2 F (36.8 C) 98.4 F (36.9 C) 98.4 F (36.9 C) 98.4 F (36.9 C)  TempSrc:   Oral   SpO2: 100% 99% 100% 98%  Weight:   111.4 kg   Height:        PHYSICAL EXAM General: Pleasant middle-aged black male, well nourished, in no acute distress.  Sitting upright in recliner with fianc at bedside HEENT:  Normocephalic and atraumatic. Neck:  No JVD.  Lungs: Normal respiratory effort on room air. Clear bilaterally to auscultation. No wheezes, crackles, rhonchi.  Heart: HRRR . Normal S1 and S2 without gallops or murmurs.  Abdomen: Non-distended appearing.  Msk: Right upper extremity weakness Extremities: Warm and well perfused. No clubbing, cyanosis.  Very trace right lower extremity edema.  Neuro: Alert and oriented X 3.  Slight slurring of speech, overall easily understandable but not at baseline Psych:  Answers questions appropriately.   Labs: Basic Metabolic Panel: Recent Labs    10/06/22 1113  NA 139  K 3.6  CL 108  CO2 23  GLUCOSE 119*  BUN 14  CREATININE 0.81  CALCIUM 9.2    Liver Function Tests: Recent Labs    10/06/22 1113  AST 26  ALT 16  ALKPHOS 48  BILITOT 1.1  PROT 7.3  ALBUMIN 3.6   No results for input(s): "LIPASE", "AMYLASE" in the last 72 hours. CBC: Recent Labs    10/06/22 1113  WBC 7.9  HGB 16.3  HCT 49.1  MCV 84.9  PLT 255   Cardiac Enzymes: No results for input(s): "CKTOTAL", "CKMB", "CKMBINDEX", "TROPONINIHS" in the last 72 hours. BNP: No results for input(s): "BNP" in the last 72 hours. D-Dimer: No results for input(s): "DDIMER" in the last 72 hours. Hemoglobin A1C: No results for input(s): "HGBA1C" in the last 72 hours. Fasting Lipid Panel: Recent Labs    10/05/22 0640  CHOL 155  HDL 36*  LDLCALC 108*  TRIG 57  CHOLHDL 4.3   Thyroid Function Tests: No results for input(s): "TSH", "T4TOTAL", "T3FREE", "THYROIDAB" in the last 72 hours.  Invalid input(s): "FREET3" Anemia Panel: No results for input(s): "VITAMINB12", "FOLATE", "FERRITIN", "TIBC", "IRON", "RETICCTPCT" in the last 72 hours.   Radiology: ECHOCARDIOGRAM COMPLETE  Result Date: 10/06/2022    ECHOCARDIOGRAM REPORT   Patient Name:   Ricardo L Brearley Date of Exam: 10/05/2022 Medical Rec #:  [id number]     Height:       75.0 in Accession #:    [id number]    Weight:       240.5 lb Date of Birth:  Oct 07, 1976      BSA:          2.373 m Patient Age:    46 years      BP:           130/85 mmHg Patient Gender: M             HR:           87 bpm. Exam Location:  [facility] Procedure: 2D Echo, Cardiac Doppler, Color Doppler and Intracardiac            Opacification Agent Indications:     Stroke  History:         Patient has prior history of Echocardiogram examinations. CAD,                  Stroke; Risk Factors:Hypertension and Polysubstance Abuse.  Sonographer:     L. Thornton-Maynard Referring Phys:  [id number] TOCHUKWU AGBATA Diagnosing Phys: Alwyn Pea MD  Sonographer Comments: Suboptimal apical window. IMPRESSIONS  1. Left ventricular ejection fraction, by estimation, is  <20%. The left ventricle has severely decreased function. The left ventricle demonstrates global hypokinesis. The left ventricular internal cavity size was severely dilated. Left ventricular diastolic function could not be evaluated.  2. Right ventricular systolic function is mildly reduced. The right ventricular size is mildly enlarged. Mildly increased right ventricular wall thickness. There is normal pulmonary artery systolic pressure.  3. Left atrial size was mildly dilated.  4. Right atrial size was mildly dilated.  5. The mitral valve is normal in structure. Mild mitral valve regurgitation.  6. The aortic valve is normal in structure. Aortic valve regurgitation is trivial. FINDINGS  Left Ventricle: Left ventricular ejection fraction, by estimation, is <20%. The left ventricle has severely decreased function. The left ventricle demonstrates global hypokinesis. Definity contrast agent was given IV to delineate the left ventricular endocardial borders. The left ventricular internal cavity size was severely dilated. There is no left ventricular hypertrophy. Left ventricular diastolic function could not be evaluated. Right Ventricle: The right ventricular size is mildly enlarged. Mildly increased right ventricular wall thickness. Right ventricular systolic function is mildly reduced. There is normal pulmonary artery systolic pressure. The tricuspid regurgitant velocity is 2.25 m/s, and with an assumed right atrial pressure of 3 mmHg, the estimated right ventricular systolic pressure is 23.2 mmHg. Left Atrium: Left atrial size was mildly dilated. Right Atrium: Right atrial size was mildly dilated. Pericardium: There is no evidence of pericardial effusion. Mitral Valve: The mitral valve is normal in structure. Mild mitral valve regurgitation. MV peak gradient, 3.6 mmHg. The mean mitral valve gradient is 2.0 mmHg. Tricuspid Valve: The tricuspid valve is normal in structure. Tricuspid valve regurgitation is mild.  Aortic Valve: The aortic valve is normal in structure. Aortic valve regurgitation is trivial. Aortic valve mean gradient measures 2.0 mmHg. Aortic valve peak gradient measures 3.6 mmHg. Aortic valve area, by VTI measures 3.50 cm. Pulmonic Valve: The pulmonic valve was normal in structure. Pulmonic valve regurgitation is not visualized. Aorta: The ascending aorta was not well visualized. IAS/Shunts: No atrial level shunt detected by color flow Doppler.  LEFT VENTRICLE PLAX 2D LVIDd:         7.00 cm      Diastology LVIDs:         6.25 cm      LV e' medial:    5.00 cm/s LV PW:         0.80 cm      LV E/e' medial:  17.6 LV IVS:        0.70 cm      LV e' lateral:   6.85 cm/s LVOT diam:     2.40 cm      LV E/e' lateral: 12.9 LV SV:         55 LV SV Index:   23 LVOT Area:     4.52 cm  LV Volumes (MOD) LV vol d, MOD A2C: 188.0 ml LV vol d, MOD A4C: 189.0 ml LV vol s, MOD A2C: 136.0 ml LV vol s, MOD A4C: 188.0 ml LV SV MOD A2C:     52.0 ml LV SV MOD A4C:     189.0 ml LV SV MOD BP:      25.2 ml RIGHT VENTRICLE RV Basal diam:  3.60 cm RV S prime:     17.40 cm/s TAPSE (M-mode): 2.0 cm LEFT ATRIUM  Index        RIGHT ATRIUM           Index LA diam:        4.20 cm  1.77 cm/m   RA Area:     14.70 cm LA Vol (A2C):   113.0 ml 47.61 ml/m  RA Volume:   31.10 ml  13.10 ml/m LA Vol (A4C):   68.2 ml  28.74 ml/m LA Biplane Vol: 88.4 ml  37.25 ml/m  AORTIC VALVE                    PULMONIC VALVE AV Area (Vmax):    3.81 cm     PV Vmax:       0.91 m/s AV Area (Vmean):   3.61 cm     PV Peak grad:  3.3 mmHg AV Area (VTI):     3.50 cm AV Vmax:           94.95 cm/s AV Vmean:          66.250 cm/s AV VTI:            0.156 m AV Peak Grad:      3.6 mmHg AV Mean Grad:      2.0 mmHg LVOT Vmax:         80.03 cm/s LVOT Vmean:        52.833 cm/s LVOT VTI:          0.121 m LVOT/AV VTI ratio: 0.77  AORTA Ao Root diam: 4.00 cm Ao Asc diam:  3.20 cm MITRAL VALVE                  TRICUSPID VALVE MV Area (PHT): 6.51 cm       TR Peak  grad:   20.2 mmHg MV Area VTI:   2.78 cm       TR Vmax:        225.00 cm/s MV Peak grad:  3.6 mmHg MV Mean grad:  2.0 mmHg       SHUNTS MV Vmax:       0.94 m/s       Systemic VTI:  0.12 m MV Vmean:      58.3 cm/s      Systemic Diam: 2.40 cm MV Decel Time: 117 msec MR Peak grad:    66.6 mmHg MR Mean grad:    44.0 mmHg MR Vmax:         408.00 cm/s MR Vmean:        311.0 cm/s MR PISA:         1.57 cm MR PISA Eff ROA: 14 mm MR PISA Radius:  0.50 cm MV E velocity: 88.07 cm/s Dwayne D Callwood MD Electronically signed by Alwyn Pea MD Signature Date/Time: 10/06/2022/4:41:33 PM    Final    MR BRAIN WO CONTRAST  Result Date: 10/04/2022 CLINICAL DATA:  Stroke suspected EXAM: MRI HEAD WITHOUT CONTRAST TECHNIQUE: Multiplanar, multiecho pulse sequences of the brain and surrounding structures were obtained without intravenous contrast. COMPARISON:  Same day CT brain and head/neck angiogram. MRI Brain 05/21/2009 FINDINGS: Brain: Acute infarcts in the left ACA, left MCA, and likely the right PCA territory infarcts. There is a large infarct involving the left postcentral gyrus (series 5002, image 42). Additional infarcts are also seen involving the medial cortex of the left frontal lobe and lateral aspect of the left frontal lobe (series 5002, image 31). There is also a small infarct in the posterior right temporal  lobe (series 5002, image 27). Redemonstrated periventricular and subcortical T2/FLAIR hyperintense signal, some of which appear triangular and perpendicular (series 58592, image 30), unchanged from prior. There is a focal region in the right occipital lobe where there is incomplete suppression of FLAIR signal (sereis 15001, image 28) with associated linear susceptibility artifact (series 92446, image 27). Small amount of T2/FLAIR hyperintense signal is also seen at the root entry zone of the right trigeminal nerve (series 28638, image 16). No extra-axial fluid collection. No hydrocephalus. Vascular: Normal  flow voids. Skull and upper cervical spine: Normal marrow signal. Sinuses/Orbits: Negative. Other: None. IMPRESSION: 1. Acute infarcts in the left ACA, left MCA, and likely the right PCA territory, with a large infarct involving the left postcentral gyrus. Given distribution, these are favored to represent infarcts related to a central embolic etiology. There is a focal region in the right occipital lobe where there is likely a small amount of subarachnoid blood products, which could suggest an underlying component of vasculitis. Recommend further evaluation with echocardiography. 2. Redemonstrated periventricular and subcortical T2/FLAIR hyperintense signal, as well as at the root entry zone of the right trigeminal nerve, which are suggestive of an underlying demyelinating disease. Electronically Signed   By: Lorenza Cambridge M.D.   On: 10/04/2022 13:23   DG Abd 1 View  Result Date: 10/04/2022 CLINICAL DATA:  Screening for metallic foreign body EXAM: ABDOMEN - 1 VIEW COMPARISON:  None Available. FINDINGS: Nonobstructive bowel gas pattern. Contrast opacify the urinary bladder. Lung bases are clear. No metallic foreign body is visualized. No acute osseous abnormality. IMPRESSION: No metallic foreign body is visualized. Electronically Signed   By: Lorenza Cambridge M.D.   On: 10/04/2022 12:02   DG Chest 1 View  Result Date: 10/04/2022 CLINICAL DATA:  Stroke EXAM: CHEST  1 VIEW COMPARISON:  CXR 04/20/21, CXR 08/06/19 FINDINGS: No pleural effusion. No pneumothorax. No focal airspace opacity. Low lung volumes. There is a least mild interval increase in size of the cardiac contours compared to 04/20/21 even when accounting for differences in technique and lung volumes. No focal airspace opacity. No metallic foreign body. Visualized upper abdomen is unremarkable. IMPRESSION: 1. No metallic foreign body. 2. There is a least mild interval increase in size of the cardiac contours compared to 04/20/21 even when accounting for  differences in technique and lung volumes. Consider further evaluation with echocardiography. Electronically Signed   By: Lorenza Cambridge M.D.   On: 10/04/2022 11:59   CT ANGIO HEAD NECK W WO CM W PERF (CODE STROKE)  Result Date: 10/04/2022 CLINICAL DATA:  46 year old male code stroke presentation suspicious for Left hemisphere ELVO. EXAM: CT ANGIOGRAPHY HEAD AND NECK CT PERFUSION BRAIN TECHNIQUE: Multidetector CT imaging of the head and neck was performed using the standard protocol during bolus administration of intravenous contrast. Multiplanar CT image reconstructions and MIPs were obtained to evaluate the vascular anatomy. Carotid stenosis measurements (when applicable) are obtained utilizing NASCET criteria, using the distal internal carotid diameter as the denominator. Multiphase CT imaging of the brain was performed following IV bolus contrast injection. Subsequent parametric perfusion maps were calculated using RAPID software. RADIATION DOSE REDUCTION: This exam was performed according to the departmental dose-optimization program which includes automated exposure control, adjustment of the mA and/or kV according to patient size and/or use of iterative reconstruction technique. CONTRAST:  100 mL Omnipaque 350 COMPARISON:  Plain head CT 0430 hours. FINDINGS: CT Brain Perfusion Findings: ASPECTS: 10 CBF (<30%) Volume: 63mL Perfusion (Tmax>6.0s) volume: 13mL, however, some of  this is bilateral and even registers in the brainstem. There is 12 mm of T-max > 10 S in the posterior left MCA territory. Hypoperfusion index 0.2. Mismatch Volume: Unclear. Infarction Location:Posterior left MCA territory abnormal CBF, abnormal CBV, and T-max > 10 S in an area of up to 12 mL. CTA NECK Skeleton: Scattered carious dentition. Age advanced lower cervical spine degeneration with bulky endplate osteophytosis. No acute osseous abnormality identified. Upper chest: Mild upper lung atelectasis. There is a left coronary artery  stent in place. Small volume of dependent retained secretions in the trachea approaching the carina. But otherwise the visible major airways are patent. Other neck: No acute finding. Aortic arch: 3 vessel arch configuration.  No arch atherosclerosis. Right carotid system: Negative. Left carotid system: Negative. Vertebral arteries: Suboptimal but adequate cervical vertebral artery contrast timing. Proximal right subclavian and cervical right vertebral artery appear patent and negative. Proximal left subclavian and codominant cervical left vertebral artery appear patent and negative. CTA HEAD Posterior circulation: Fairly codominant distal vertebral arteries appear patent and within normal limits to the basilar. Right PICA origin appears patent. Left AICA might be dominant, uncertain. Patent basilar artery without stenosis. SCA and PCA origins appear normal. Left posterior communicating artery is present, right is diminutive or absent. Bilateral PCA branches are within normal limits. Anterior circulation: Both ICA siphons are patent, with no siphon plaque or stenosis. Normal left posterior communicating artery. Patent carotid termini, MCA and ACA origins. Codominant A1 segments. Diminutive anterior communicating arteries suspected. Suboptimal intracranial contrast bolus. Allowing for this the ACA branches are within normal limits. Likewise, the right M1 segment, bifurcation, and right MCA branches are within normal limits. And the left MCA M1 segment also appears symmetric, patent to the trifurcation, and within normal limits. No left MCA branch occlusion is identified. Venous sinuses: Early contrast timing, grossly patent. Anatomic variants: None significant. Review of the MIP images confirms the above findings Preliminary report of the above discussed by telephone Dr. Loleta Rose on 10/04/2022 at 0518 hours. IMPRESSION: 1. Suboptimal although adequate contrast bolus. Strong evidence on CTP of a small posterior Left  MCA territory infarct core (approximately 12 mL). CTP also suggests approximately 60 mL of oligemia in the Left MCA territory. However, there is NO large vessel occlusion, and no discrete MCA branch occlusion detected on CTA. 2. No atherosclerosis, stenosis, or arterial abnormality identified in the head or neck. There is a left coronary artery stent visible in the upper chest. 3. Mild upper lung atelectasis and small volume retained secretions in the trachea. Salient findings discussed by telephone with Dr. Loleta Rose on 10/04/2022 at 0518 hours. Electronically Signed   By: Odessa Fleming M.D.   On: 10/04/2022 05:31   CT HEAD CODE STROKE WO CONTRAST  Addendum Date: 10/04/2022   ADDENDUM REPORT: 10/04/2022 04:51 ADDENDUM: Study discussed by telephone with Dr. Loleta Rose on 10/04/2022 at 0445 hours. Clinical picture is highly suspicious for Left MCA ELVO. Patient was undergoing initial tele neurology evaluation as we spoke. Electronically Signed   By: Odessa Fleming M.D.   On: 10/04/2022 04:51   Result Date: 10/04/2022 CLINICAL DATA:  Code stroke. 46 year old male with right side weakness and aphasia. EXAM: CT HEAD WITHOUT CONTRAST TECHNIQUE: Contiguous axial images were obtained from the base of the skull through the vertex without intravenous contrast. RADIATION DOSE REDUCTION: This exam was performed according to the departmental dose-optimization program which includes automated exposure control, adjustment of the mA and/or kV according to patient size and/or  use of iterative reconstruction technique. COMPARISON:  Head CT 05/20/2009. FINDINGS: Brain: No acute intracranial hemorrhage identified. No midline shift, mass effect, or evidence of intracranial mass lesion. No ventriculomegaly. Normal cerebral volume. No convincing cytotoxic edema. Mild asymmetric enlargement of the left lateral ventricle redemonstrated and normal variant. Vascular: Subtle if any asymmetric hyperdensity of left MCA branches in the sylvian  fissure. Skull: No acute osseous abnormality identified. Sinuses/Orbits: Visualized paranasal sinuses and mastoids are stable and well aerated. Other: Leftward gaze. Visualized scalp soft tissues are within normal limits. ASPECTS [facility] Stroke Program Early CT Score) Total score (0-10 with 10 being normal): 10 IMPRESSION: Leftward gaze, but no acute cortically based infarct or intracranial hemorrhage identified. And subtle if any left MCA vessel asymmetry. ASPECTS 10. Electronically Signed: By: Odessa Fleming M.D. On: 10/04/2022 04:40    TELEMETRY reviewed by me (LT) 10/07/2022 : NSR rate 70s to 80s without evidence of atrial fibrillation  EKG reviewed by me: Sinus rhythm with lateral Q waves evidence of an old anterior infarct without acute ischemic changes.  Data reviewed by me (LT) 10/07/2022: Discharge summary from 03/2021 admission, ED note, admission H&P, neurology notes, hospitalist progress note chest x-ray, vitals telemetry CBC BMP  Principal Problem:   Acute CVA (cerebrovascular accident) [facility]) Active Problems:   Essential hypertension   CAD (coronary artery disease)   Polysubstance abuse (HCC)   Expressive aphasia   Right hemiplegia (HCC)   CAD S/P percutaneous coronary angioplasty   Primary hypertension   Cocaine use    ASSESSMENT AND PLAN:  Ricardo Yang is a 18yoM with a PMH of anterior STEMI s/p PCI with DES prox LAD (07/2019), medically managed NSTEMI w/ LHC patent prox LAD stent, 100% sm-mod caliber OM2, 50% mid RCA, ost - prox LAD 20% (03/2021), HFrEF (<20%, global hypo 09/2022, prev 25-30% 03/2021), obesity, polysubstance abuse (cocaine, tobacco), hx CVA who presented to [facility] ED 10/04/22 with right sided hemiplegia and expressive aphasia. Code Stroke activated in the ED, no LVO on CT. He did not receive thrombolytics. 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 Cardiology is consulted on hospital day 3 for assistance with his HF and  to arrange TEE.   # left MCA territory infarct  # cocaine abuse Seen and evaluated by neurology, with etiology of CVA felt to be related to cocaine/vasospasm.  Neurology recommends TEE.  Will plan for this with Dr. Azucena Cecil - currently scheduled for tomorrow, 12/13 at ~11:45am. Will make NPO at midnight tonight 12/12. -If TEE is negative, will arrange for cardiac monitoring at discharge for evaluation of arrhythmia/atrial fibrillation   # HFrEF (<20%)  Echo this admission shows a reduction in his EF compared to June 2022.  Over the past several months he reports having increased dyspnea on exertion and what sounds like apneic episodes described by his fiance (?OSA) but does not appear grossly volume overloaded on exam, comfortable on room air.  Chest x-ray without vascular congestion or pleural effusions. Unfortunately, due to financial constraints he has not been on any GDMT, nor has he had outpatient follow-up with cardiology or a PCP.   -Will discuss with pharmacy re: assistance with medications -Recommend restarting low-dose metoprolol XL 12.5 mg daily -plan to escalate GDMT with ARB, SGLT2i, MRA as BP allows (running 110s/80s prior to adding BB) -No invasive cardiac diagnostics are planned at this time -Stressed the importance of compliance with medications, low salt diet, and outpatient follow-up  -will arrange for  follow up with Dr. Darrold Junker 1-2 weeks after discharge. HF clinic follow up could also be of benefit.   # CAD w/ hx anterior STEMI 07/2019, NSTEMI 03/2021 No recent or current anginal symptoms.  As above, has not been taking prescription medications.  He has 1.5 years out from his NSTEMI, unclear how long he was compliant with DAPT. -Recommend aspirin 81 mg indefinitely and continue plavix for now  -continue atorvastatin 80mg  daily (LDL 102)  # Cocaine abuse # Tobacco abuse  He is motivated to quit smoking tobacco, but insists to me he has "cut back on cocaine" compared to  the past. Strongly encouraged complete cessation from tobacco and cocaine and the cardiac & neurologic harm of continued use.   This patient's plan of care was discussed and created with Dr. Juliann Pares and he is in agreement.  Signed: Rebeca Allegra , PA-C 10/07/2022, 10:13 AM [facility] Cardiology

## 2022-10-07 NOTE — Progress Notes (Signed)
Physical Therapy Treatment Patient Details Name: Ricardo Yang MRN: [id number] DOB: [birth year] Today's Date: 10/07/2022   History of Present Illness 46 year old male with left MCA territory infarct. PMH significant for prior CVA with no deficits, coronary artery disease with complications of ischemic cardiomyopathy    PT Comments    Patient remains exceptionally motivated to participate/progress with all therapeutic activities.  Session focused on movement transitions (sit/stand, pre-gait) and purposeful, active use of R LE with all WBing activities.  Requires constant cuing for awareness to/correction of weight shift/midline and consistent verbal and tactile cuing for R TKE in loading (especially with fatigue, divided attention).  Gait deferred this date due to generalized fatigue with task-specific activities.  Improving isolated activation of R triceps and generalized extension; good response to NDT theraband wrap to R UE with mobility tasks.      Recommendations for follow up therapy are one component of a multi-disciplinary discharge planning process, led by the attending physician.  Recommendations may be updated based on patient status, additional functional criteria and insurance authorization.  Follow Up Recommendations  Acute inpatient rehab (3hours/day)     Assistance Recommended at Discharge Frequent or constant Supervision/Assistance  Patient can return home with the following A lot of help with walking and/or transfers;A lot of help with bathing/dressing/bathroom   Equipment Recommendations       Recommendations for Other Services       Precautions / Restrictions Precautions Precautions: Shoulder;Fall Type of Shoulder Precautions: Patient does not have shoulder subluxation at this time, however has flaccid RUE, support shoulder joint appropriately. Precaution Booklet Issued: No Restrictions Weight Bearing Restrictions: No     Mobility  Bed Mobility                General bed mobility comments: seated in recliner beginning/end of treatment session    Transfers Overall transfer level: Needs assistance Equipment used: None Transfers: Sit to/from Stand Sit to Stand: Min assist                Ambulation/Gait               General Gait Details: deferred due to emphasis of session   Stairs             Wheelchair Mobility    Modified Rankin (Stroke Patients Only)       Balance Overall balance assessment: Needs assistance Sitting-balance support: No upper extremity supported, Feet supported Sitting balance-Leahy Scale: Good     Standing balance support: Single extremity supported Standing balance-Leahy Scale: Fair Standing balance comment: min/mod assist for midline, weight shift and balance correction                            Cognition Arousal/Alertness: Awake/alert Behavior During Therapy: WFL for tasks assessed/performed Overall Cognitive Status: Within Functional Limits for tasks assessed                                 General Comments: Occasional dysarthria and word-finding difficulty; excellent efforts at articulation and ennunciation to optimize expressive language.  Exceptionally motivated to participate/progress.        Exercises Other Exercises Other Exercises: Repeated sit/stand from recliner, emphasis on foot placement, symmetrical WBing and active use of R LE (to prevent learned non-use of R hemi-body), min progressing to cga level of assist.  R UE supported throughout to protect shoulder.  Once  upright, worked to optimize awareness of full postural extension (R hip/knee extension) and midline in frontal/saggital planes.  Does list towards R post/lateral direction with fatigue and divided attention, corrects with min cuing from therapist Other Exercises: Alternate LE forward/retro stepping with loftstrand, min/mod assist-emphasis on L ant/lateral weight shift, R LE TKE  in stance.  Constant verbal cuing for R TKE in loading and balance correction to midline throughout activity.  Improved postural extension and overall midline orientation, improved active loading/use of R LE with loftstrand (versus HW). Other Exercises: NDT theraband wrap to R UE for approximation and support throughout all standing activities.  Does demonstrate isolated triceps extension and overall R UE gross/generalized extension at least 2-/5 this session (increased time, efforts required to activate). Other Exercises: Reviewed recovery and progression of functional activities after stroke, role of/importance of HEP outside of therapy, importance of intentional, forced-use of R hemi-body with all activities. Discussed documenting/journaling functional progress throughout recovery to maintain awareness of gains.  Patient/fiancee receptive to all information; voiced agreement with all.    General Comments        Pertinent Vitals/Pain Pain Assessment Pain Assessment: No/denies pain    Home Living                          Prior Function            PT Goals (current goals can now be found in the care plan section) Acute Rehab PT Goals Patient Stated Goal: very eager for discharge to [facility] PT Goal Formulation: With patient Time For Goal Achievement: 10/18/22 Potential to Achieve Goals: Good Progress towards PT goals: Progressing toward goals    Frequency    7X/week      PT Plan Current plan remains appropriate    Co-evaluation              AM-PAC PT "6 Clicks" Mobility   Outcome Measure  Help needed turning from your back to your side while in a flat bed without using bedrails?: A Little Help needed moving from lying on your back to sitting on the side of a flat bed without using bedrails?: A Little Help needed moving to and from a bed to a chair (including a wheelchair)?: A Lot Help needed standing up from a chair using your arms (e.g., wheelchair or bedside  chair)?: A Lot Help needed to walk in hospital room?: A Lot Help needed climbing 3-5 steps with a railing? : A Lot 6 Click Score: 14    End of Session Equipment Utilized During Treatment: Gait belt Activity Tolerance: Patient tolerated treatment well Patient left: in chair;with chair alarm set;with call bell/phone within reach;with family/visitor present Nurse Communication: Mobility status PT Visit Diagnosis: Muscle weakness (generalized) (M62.81);Difficulty in walking, not elsewhere classified (R26.2);Hemiplegia and hemiparesis;Unsteadiness on feet (R26.81);Other abnormalities of gait and mobility (R26.89) Hemiplegia - Right/Left: Right Hemiplegia - dominant/non-dominant: Dominant Hemiplegia - caused by: Cerebral infarction     Time: 1517-6160 PT Time Calculation (min) (ACUTE ONLY): 34 min  Charges:  $Neuromuscular Re-education: 23-37 mins                    Samvel Zinn H. Manson Passey, PT, DPT, NCS 10/07/22, 10:55 PM [phone]

## 2022-10-07 NOTE — Plan of Care (Signed)
Note that patient's echocardiogram demonstrated biatrial enlargement and EF of less than 20%, appreciate cardiology evaluation for TEE If this is also negative I would recommend event monitor on discharge to evaluate for occult atrial fibrillation given his left atrial enlargement and embolic pattern of strokes  Neurology will plan to follow-up with patient after TEE unless there is an acute change today  No charge plan of care note

## 2022-10-07 NOTE — PMR Pre-admission (Signed)
PMR Admission Coordinator Pre-Admission Assessment  Patient: Ricardo Yang is an 46 y.o., male MRN: [id number] DOB: 1976/03/23 Height: _0  (190.5 cm) Weight: 111.4 kg  Insurance Information PRIMARY: Uninsured- medicare pending        The Engineer, petroleum" for patients in Inpatient Rehabilitation Facilities with attached "Privacy Act [place] Records" was provided and verbally reviewed with: Patient  Emergency Contact Information Contact Information     Name Relation Home Work Mobile   [place] Significant other   [phone]       Current Medical History  Patient Admitting Diagnosis: L CVA  History of Present Illness: A 46 year old right-handed male with history of reported CVA in the past with no deficits, CAD with anterior STEMI 07/2019, non-STEMI 03/2021/status post PCI with complications of ischemic cardiomyopathy ejection fraction 25 to 30% from an echocardiogram 6/23 maintained on aspirin and Plavix last seen by cardiology services [place] 2022, hypertension maintained on lisinopril 2.5 mg daily, Toprol XL 25 mg daily, tobacco use as well as polysubstance abuse, obesity with BMI 30.70.  Per chart review patient lives with significant other.  1 level home with ramped entrance.  Reportedly independent prior to admission.  resented 10/04/2022 to the ED at [facility] with acute onset of aphasia and right-sided weakness and leftward gaze.  Cranial CT scan with no acute changes.  CT angiogram head and neck no large vessel occlusion.  No atherosclerosis, stenosis or arterial abnormality identified in the head or neck.  MRI showed acute infarcts in the left ACA, left MCA and likely of the right PCA territory with a large infarct involving the left postcentral gyrus.  Patient did not receive tPA.  Admission chemistries unremarkable except WBC 11,300, alcohol negative, urine drug screen positive for cocaine as well as marijuana,  troponin 265-275.  Echocardiogram with ejection fraction less than 20%.  The left ventricle showed severely decreased function as well as global hypokinesis.  TEE completed by cardiology services showing severely reduced systolic function and no mural apical thrombus with estimated ejection fraction 20%.  Bubble study was negative.  No PFO noted.  Elevated troponin felt to be related to demand ischemia.  Patient remains on Plavix and aspirin as prior to admission.  Permissive hypertension with low-dose lisinopril as well as Toprol resumed. Tolerating a regular consistency diet.  Therapy evaluations completed due to patient's aphasia decreased functional mobility and right side weakness.  Patient to be admitted for a comprehensive inpatient rehab program.   Patient's medical record from [facility] has been reviewed by the rehabilitation admission coordinator and physician.  Past Medical History  Past Medical History:  Diagnosis Date   Multiple sclerosis ([place])    a. question possible MS   Obesity    Polysubstance abuse ([place])    a. tobacco, cocaine, crack, ecstasy, pills, "anything but injections"    Has the patient had major surgery during 100 days prior to admission? No  Family History   family history includes Heart disease in his father.  Current Medications  Current Facility-Administered Medications:    acetaminophen (TYLENOL) tablet 650 mg, 650 mg, Oral, Q4H PRN **OR** acetaminophen (TYLENOL) 160 MG/5ML solution 650 mg, 650 mg, Per Tube, Q4H PRN **OR** acetaminophen (TYLENOL) suppository 650 mg, 650 mg, Rectal, Q4H PRN, Agbata, Tochukwu, MD   aspirin chewable tablet 81 mg, 81 mg, Oral, Daily, Benita Gutter, RPH, 81 mg at 10/09/22 0910   atorvastatin (LIPITOR) tablet 80 mg, 80 mg, Oral, q1800, Agbata, Tochukwu,  MD, 80 mg at 10/08/22 2139   clopidogrel (PLAVIX) tablet 75 mg, 75 mg, Oral, Q breakfast, Agbata, Tochukwu, MD, 75 mg at 10/09/22 0910   dapagliflozin  propanediol (FARXIGA) tablet 10 mg, 10 mg, Oral, Daily, Tang, Lily Michelle, PA-C, 10 mg at 10/09/22 0910   lactulose (CHRONULAC) 10 GM/15ML solution 20 g, 20 g, Oral, Once, Sharen Hones, MD   lisinopril (ZESTRIL) tablet 2.5 mg, 2.5 mg, Oral, Daily, Sharen Hones, MD, 2.5 mg at 10/09/22 0910   metoprolol succinate (TOPROL-XL) 24 hr tablet 12.5 mg, 12.5 mg, Oral, Daily, Tang, Lily Michelle, PA-C, 12.5 mg at 10/09/22 0910   polyethylene glycol (MIRALAX / GLYCOLAX) packet 17 g, 17 g, Oral, Daily, Sharen Hones, MD  Patients Current Diet:  Diet Order             Diet - low sodium heart healthy           Diet Heart Room service appropriate? Yes; Fluid consistency: Thin  Diet effective now                   Precautions / Restrictions Precautions Precautions: Shoulder, Fall Type of Shoulder Precautions: Patient does not have shoulder subluxation at this time, however has flaccid RUE, support shoulder joint appropriately. Precaution Booklet Issued: No Restrictions Weight Bearing Restrictions: No RUE Weight Bearing: Non weight bearing   Has the patient had 2 or more falls or a fall with injury in the past year? No  Prior Activity Level Community (5-7x/wk): Independent  Prior Functional Level Self Care: Did the patient need help bathing, dressing, using the toilet or eating? Independent  Indoor Mobility: Did the patient need assistance with walking from room to room (with or without device)? Independent  Stairs: Did the patient need assistance with internal or external stairs (with or without device)? Independent  Functional Cognition: Did the patient need help planning regular tasks such as shopping or remembering to take medications? Independent  Patient Information Are you of Hispanic, Latino/a,or Spanish origin?: A. No, not of Hispanic, Latino/a, or Spanish origin What is your race?: B. Black or African American Do you need or want an interpreter to communicate with a doctor or  health care staff?: 0. No  Patient's Response To:  Health Literacy and Transportation Is the patient able to respond to health literacy and transportation needs?: Yes Health Literacy - How often do you need to have someone help you when you read instructions, pamphlets, or other written material from your doctor or pharmacy?: Never In the past 12 months, has lack of transportation kept you from medical appointments or from getting medications?: No In the past 12 months, has lack of transportation kept you from meetings, work, or from getting things needed for daily living?: No  Development worker, international aid / [place] Devices/Equipment: None  Prior Device Use: Indicate devices/aids used by the patient prior to current illness, exacerbation or injury? None of the above  Current Functional Level Cognition  Arousal/Alertness: Awake/alert Overall Cognitive Status: Within Functional Limits for tasks assessed Orientation Level: Oriented X4 General Comments: eyes closing wihtout stimulation at end of session - very recently returned from TEE procedure    Extremity Assessment (includes Sensation/Coordination)  Upper Extremity Assessment: RUE deficits/detail RUE Deficits / Details: 3-/5 grossly RUE Sensation: decreased light touch RUE Coordination: decreased fine motor, decreased gross motor  Lower Extremity Assessment: RLE deficits/detail RLE Deficits / Details: MAS R hamstring 2, quadriceps 1 RLE Coordination: decreased gross motor    ADLs  Overall ADL's : Needs assistance/impaired Eating/Feeding: Set up, Bed level Eating/Feeding Details (indicate cue type and reason): set up A using non-dominant hand for self-feeding Lower Body Dressing: Maximal assistance, Bed level Lower Body Dressing Details (indicate cue type and reason): socks Toileting- Clothing Manipulation and Hygiene: Maximal assistance, Bed level General ADL Comments: SETUP + CGA grooming standing sink side, cues  for WBing through RUE on counter. MAX A self-drinking using RUE seated in bed    Mobility  Overal bed mobility: Needs Assistance Bed Mobility: Supine to Sit Rolling: Min assist Supine to sit: Min guard Sit to supine: Mod assist General bed mobility comments: deferred 2/2 fatigue from recent procedure    Transfers  Overall transfer level: Needs assistance Equipment used: Hemi-walker Transfers: Sit to/from Stand Sit to Stand: Min assist Bed to/from chair/wheelchair/BSC transfer type:: Squat pivot, Step pivot Stand pivot transfers: Mod assist, +2 physical assistance, Min assist Squat pivot transfers: Mod assist Step pivot transfers: Mod assist, From elevated surface General transfer comment: assist for RUE support    Ambulation / Gait / Stairs / Wheelchair Mobility  Ambulation/Gait Ambulation/Gait assistance: Mod assist, +2 physical assistance, +2 safety/equipment Gait Distance (Feet):  (25 ft + 22 ft + 20 ft) Assistive device:  (L rail in hallway, transitioning to LUE HHA when rail runs out) Gait Pattern/deviations: Step-to pattern General Gait Details: deferred due to emphasis of session Gait velocity: decreased    Posture / Balance Dynamic Sitting Balance Sitting balance - Comments: generally steady in sitting but close suprvision provided. Balance Overall balance assessment: Needs assistance Sitting-balance support: No upper extremity supported, Feet supported Sitting balance-Leahy Scale: Good Sitting balance - Comments: generally steady in sitting but close suprvision provided. Postural control: Right lateral lean, Posterior lean Standing balance support: No upper extremity supported, During functional activity Standing balance-Leahy Scale: Fair Standing balance comment: min/mod assist for midline, weight shift and balance correction    Special needs/care consideration None   Previous Home Environment (from acute therapy documentation) Living Arrangements:  Spouse/significant other  Lives With: Significant other Available Help at Discharge: Family, Available 24 hours/day Type of Home: House Home Layout: One level Home Access: Ramped entrance Bathroom Shower/Tub: Chiropodist: Standard Bathroom Accessibility: No  Discharge Living Setting Plans for Discharge Living Setting: Patient's home, Lives with (comment) ([place]) Type of Home at Discharge: House Discharge Home Layout: One level Discharge Home Access: [place] entrance Discharge Bathroom Shower/Tub: Tub/shower unit Discharge Bathroom Toilet: Standard Discharge Bathroom Accessibility: No Does the patient have any problems obtaining your medications?: No  Social/Family/Support Systems Patient Roles: Partner Anticipated Caregiver: Vergia Alberts Anticipated Caregiver's Contact Information: [phone] Caregiver Availability: 24/7 Discharge Plan Discussed with Primary Caregiver: Yes Is Caregiver In Agreement with Plan?: Yes Does Caregiver/Family have Issues with Lodging/Transportation while Pt is in Rehab?: No  Goals Patient/Family Goal for Rehab: Mod I PT, OT, Independent SLP Expected length of stay: 10-12 days Pt/Family Agrees to Admission and willing to participate: Yes Program Orientation Provided & Reviewed with Pt/Caregiver Including Roles  & Responsibilities: Yes  Barriers to Discharge: Insurance for SNF coverage  Decrease burden of Care through IP rehab admission: N/A  Possible need for SNF placement upon discharge: not anticipated  Patient Condition: I have reviewed medical records from [facility], spoken with  TOC , and patient. I met with patient at the bedside for inpatient rehabilitation assessment.  Patient will benefit from ongoing PT, OT, and SLP, can actively participate in 3 hours of therapy a day 5 days of the  week, and can make measurable gains during the admission.  Patient will also benefit from the coordinated team approach during an  Inpatient Acute Rehabilitation admission.  The patient will receive intensive therapy as well as Rehabilitation physician, nursing, social worker, and care management interventions.  Due to safety, skin/wound care, disease management, medication administration, and patient education the patient requires 24 hour a day rehabilitation nursing.  The patient is currently min to mod assist with mobility and mod to max assist with basic ADLs.  Discharge setting and therapy post discharge at home with home health is anticipated.  Patient has agreed to participate in the Acute Inpatient Rehabilitation Program and will admit today.  Preadmission Screen Completed By:  Retta Diones, 10/09/2022 10:44 AM ______________________________________________________________________   Discussed status with Dr. Jennye Boroughs on 10/09/22 at 0945 and received approval for admission today.  Admission Coordinator:  Retta Diones, RN, time 1050/Date 10/09/22   Assessment/Plan: Diagnosis: CVA Does the need for close, 24 hr/day Medical supervision in concert with the patient's rehab needs make it unreasonable for this patient to be served in a less intensive setting? Yes Co-Morbidities requiring supervision/potential complications: Polysubstance abuse, CAD. HTN, Due to bladder management, bowel management, safety, skin/wound care, disease management, medication administration, pain management, and patient education, does the patient require 24 hr/day rehab nursing? Yes Does the patient require coordinated care of a physician, rehab nurse, PT, OT, and SLP to address physical and functional deficits in the context of the above medical diagnosis(es)? Yes Addressing deficits in the following areas: balance, endurance, locomotion, strength, transferring, bowel/bladder control, bathing, dressing, feeding, grooming, toileting, cognition, speech, language, and psychosocial support Can the patient actively participate in an  intensive therapy program of at least 3 hrs of therapy 5 days a week? Yes The potential for patient to make measurable gains while on inpatient rehab is excellent Anticipated functional outcomes upon discharge from inpatient rehab: modified independent PT, modified independent OT, independent SLP Estimated rehab length of stay to reach the above functional goals is: 10-12 Anticipated discharge destination: Home 10. Overall Rehab/Functional Prognosis: excellent   MD Signature: Jennye Boroughs

## 2022-10-07 NOTE — Progress Notes (Signed)
Occupational Therapy Treatment Patient Details Name: Ricardo Yang MRN: [id number] DOB: 30-Jul-1976 Today's Date: 10/07/2022   History of present illness 46 year old male with left MCA territory infarct. PMH significant for prior CVA with no deficits, coronary artery disease with complications of ischemic cardiomyopathy   OT comments  Ricardo Yang was seen for OT treatment on this date. Upon arrival to room pt reclined in bed, agreeable to tx. Pt requires MIN A exit L side of bed. MIN A tooth brushing standing sink side using nondominant LUE, assist for setup and standing balance - cues to WB through RUE on counter. Completed WBing through R hand standing at sink with assist to facilitate shoulder flexion/extension and adduction/abduction to cpmplete circular patterns along countertop - use of pillow case to reduce friction.   MIN A + HW for functional mobility, tolerates 2 trials ~15 ft x ~60 ft. MIN A sit<>stand x8 from chair and elevated bed height - assist to support RUE only. Pt tolerated cold and sharp touch to RUE for NMR - identifies cold accurately t/o, increased difficulty accurately identifying sharp touch distally in hand. Reviewed stroke booklet including stroke risk factors. Pt making good progress toward goals, will continue to follow POC. Discharge recommendation remains appropriate.     Recommendations for follow up therapy are one component of a multi-disciplinary discharge planning process, led by the attending physician.  Recommendations may be updated based on patient status, additional functional criteria and insurance authorization.    Follow Up Recommendations  Acute inpatient rehab (3hours/day)     Assistance Recommended at Discharge Frequent or constant Supervision/Assistance  Patient can return home with the following  Two people to help with walking and/or transfers;Two people to help with bathing/dressing/bathroom   Equipment Recommendations  Other (comment) (defer)     Recommendations for Other Services Rehab consult    Precautions / Restrictions Precautions Precautions: Shoulder;Fall Type of Shoulder Precautions: Patient does not have shoulder subluxation at this time, however has flaccid RUE, support shoulder joint appropriately. Restrictions Weight Bearing Restrictions: No       Mobility Bed Mobility Overal bed mobility: Needs Assistance Bed Mobility: Supine to Sit     Supine to sit: Min assist     General bed mobility comments: increased time exiting L side of bed vs R side (trialed yesterday)    Transfers Overall transfer level: Needs assistance Equipment used: Hemi-walker Transfers: Sit to/from Stand Sit to Stand: Min assist           General transfer comment: assist for RUE support only during multiple (~8) sit<>stand trials from bed and chair height     Balance Overall balance assessment: Needs assistance Sitting-balance support: Feet supported Sitting balance-Leahy Scale: Good     Standing balance support: No upper extremity supported, During functional activity Standing balance-Leahy Scale: Fair                             ADL either performed or assessed with clinical judgement   ADL Overall ADL's : Needs assistance/impaired                                       General ADL Comments: MIN A tooth brushing standing sink side using nondominant LUE, assist for setup and standing balance - cues to WB through RUE on counter. MIN A + HW for functional mobility, tolerates 2  trials ~15 ft x ~60 ft.    Extremity/Trunk Assessment Upper Extremity Assessment RUE Sensation: decreased light touch             Cognition Arousal/Alertness: Awake/alert Behavior During Therapy: WFL for tasks assessed/performed Overall Cognitive Status: Within Functional Limits for tasks assessed                                          Exercises Exercises: Other exercises Other Exercises:  Tolerated cold and sharp touch to RUE for NMR - identifies cold accurately t/o, increased difficulty accurately identifying sharp touch distally in hand.  Other Exercises: Reviewed stroke booklet including stroke risk factors Other Exercises: WBing through R hand standing at sink with assist to facilitate shoulder flexion/extension and adduction/abduction to cpmplete circular patterns along countertop - use of pillow case to reduce friction            Pertinent Vitals/ Pain       Pain Assessment Pain Assessment: No/denies pain   Frequency  Min 5X/week        Progress Toward Goals  OT Goals(current goals can now be found in the care plan section)  Progress towards OT goals: Progressing toward goals  Acute Rehab OT Goals Patient Stated Goal: get stronger OT Goal Formulation: With patient/family Time For Goal Achievement: 10/18/22 Potential to Achieve Goals: Good ADL Goals Pt Will Perform Grooming: sitting;with set-up Pt Will Perform Lower Body Dressing: with min assist;sitting/lateral leans Pt Will Transfer to Toilet: bedside commode;stand pivot transfer;with mod assist Pt Will Perform Toileting - Clothing Manipulation and hygiene: with min assist;sitting/lateral leans Pt/caregiver will Perform Home Exercise Program: Increased ROM;Increased strength;Right Upper extremity;With written HEP provided  Plan Discharge plan remains appropriate;Frequency remains appropriate    Co-evaluation                 AM-PAC OT "6 Clicks" Daily Activity     Outcome Measure   Help from another person eating meals?: A Little Help from another person taking care of personal grooming?: A Little Help from another person toileting, which includes using toliet, bedpan, or urinal?: A Lot Help from another person bathing (including washing, rinsing, drying)?: A Lot Help from another person to put on and taking off regular upper body clothing?: A Lot Help from another person to put on and  taking off regular lower body clothing?: A Lot 6 Click Score: 14    End of Session    OT Visit Diagnosis: Other abnormalities of gait and mobility (R26.89);Muscle weakness (generalized) (M62.81);Hemiplegia and hemiparesis Hemiplegia - Right/Left: Right Hemiplegia - dominant/non-dominant: Dominant Hemiplegia - caused by: Cerebral infarction   Activity Tolerance Patient tolerated treatment well   Patient Left in chair;with call bell/phone within reach;with chair alarm set;with family/visitor present   Nurse Communication          Time: 4259-5638 OT Time Calculation (min): 60 min  Charges: OT General Charges $OT Visit: 1 Visit OT Treatments $Self Care/Home Management : 23-37 mins $Neuromuscular Re-education: 23-37 mins  Kathie Dike, M.S. OTR/L  10/07/22, 10:26 AM  ascom [phone]

## 2022-10-07 NOTE — Progress Notes (Signed)
PROGRESS NOTE  Ricardo Yang    DOB: 1976/05/24, 46 y.o.  YWV:[id number]    Code Status: Full Code   DOA: 10/04/2022   LOS: 3   Brief hospital course  Ricardo Yang is a 46 y.o. male with a PMH significant for prior CVA with no deficits, coronary artery disease with complications of ischemic cardiomyopathy with last known LVEF of 25 to 30% from a 2D echocardiogram which was done 06/23, hypertension and nicotine dependence.  They presented from home to the ED via EMS on 10/04/2022 with right sided weakness and global aphagia occurring overnight. Pt's partner called EMS and he was transported to hospital with code stroke. Neurology evaluated on presentation.   In the ED, it was found that they had stable vital signs.  Significant findings included CT head-early changes in the left parietal region, CTA/P-multifocal areas of penumbra, more on the left than right with area of significantly increased cerebral blood flow (infarct) in the left parietal region. Brain MRI positive for acute infarcts of left ACA, left MCA and likley right PCA territory with large infarct involving left postcentral gyrus as well as other abnormalities (see report for further detail) pointing to embolic source. He was also found to be cocaine positive on presentation.  He was not a candidate for intervention.   They were initially treated with supportive care for acute CVA.   12/11- echo results read: 1. Left ventricular ejection fraction, by estimation, is <20%. The left  ventricle has severely decreased function. The left ventricle demonstrates  global hypokinesis. The left ventricular internal cavity size was severely  dilated. Left ventricular  diastolic function could not be evaluated.   2. Right ventricular systolic function is mildly reduced. The right  ventricular size is mildly enlarged. Mildly increased right ventricular  wall thickness. There is normal pulmonary artery systolic pressure.   3. Left atrial  size was mildly dilated.   4. Right atrial size was mildly dilated.   5. The mitral valve is normal in structure. Mild mitral valve  regurgitation.   6. The aortic valve is normal in structure. Aortic valve regurgitation is  trivial.  This is grossly worsened from prior study 6/22 with EF 25-30%. No signs of vegetations/arrhythmia  12/12- cardiology consulted to perform TEE. Clinically, patient is making significant improvements in speech and mobility/function. Recommended for dc to CIR when workup complete.   Assessment & Plan  Principal Problem:   Acute CVA (cerebrovascular accident) [facility]) Active Problems:   Essential hypertension   CAD (coronary artery disease)   Polysubstance abuse (HCC)   Expressive aphasia   Right hemiplegia (HCC)   CAD S/P percutaneous coronary angioplasty   Primary hypertension   Cocaine use  Acute CVA- as seen on brain MRI. CT angiogram of the head and neck was negative for any LVO. Likely embolic given distribution. Not an interventional candidate due to lack of LVO and timing since onset. Global aphagia on presentation and now speaking clearly with word delay and is improving. Ambulating short distances with walker today. Still no motor ability in right arm.  - neurology following, appreciate recs - cardiology following, appreciate recs  - planning TEE 12/13   - NPO at midnight - ASA, plavix, statin - PT/OT/SLP- recommending CIR. - TOC engaged for rehab arrangement   Polysubstance abuse (HCC) Monitor closely for signs and symptoms of withdrawal - counsel on effects of cocaine, tobacco - nicotine patch PRN   CAD  HTN  HFrE (<20%)- s/p PCI with  stent angioplasty with complications of ischemic cardiomyopathy. Last 2D echocardiogram showed an LVEF of 25 to 30% from 06/23 - Continue aspirin and statins - restarting BP medications  - metoprolol - cards managing restarting GDMT as patient had affordability issues preventing outpatient use  - ARB,  SGLT2i, MRA if BP will tolerate - pharmacy consulted for evaluation of discharge medication affordability   Body mass index is 30.7 kg/m.  VTE ppx: SCD's Start: 10/04/22 2952  Diet:     Diet   Diet regular Room service appropriate? Yes; Fluid consistency: Thin   Consultants: Neurology  Cardiology  Subjective 10/07/22    Pt reports feeling improved. Still no motor recoupement in right arm but LE and speech greatly improving. Partner at bedside has questions about discharge planning.  Questions and concerns addressed at time of encounter.    Objective   Vitals:   10/06/22 1953 10/07/22 0028 10/07/22 0446 10/07/22 0751  BP: 129/87 116/78 (!) 132/92 (!) 121/93  Pulse: 82 82 83 82  Resp: 16 20 16 19   Temp: 98.2 F (36.8 C) 98.4 F (36.9 C) 98.4 F (36.9 C) 98.4 F (36.9 C)  TempSrc:   Oral   SpO2: 100% 99% 100% 98%  Weight:   111.4 kg   Height:        Intake/Output Summary (Last 24 hours) at 10/07/2022 0757 Last data filed at 10/07/2022 0400 Gross per 24 hour  Intake 1800 ml  Output 3625 ml  Net -1825 ml    Filed Weights   10/04/22 2042 10/07/22 0446  Weight: 109.1 kg 111.4 kg     Physical Exam:  General: awake, alert, NAD HEENT: atraumatic, clear conjunctiva, anicteric sclera, MMM, hearing grossly normal Respiratory: normal respiratory effort. Cardiovascular: quick capillary refill, normal S1/S2, RRR, no JVD, murmurs Gastrointestinal: soft, NT, ND Nervous: A&O x3. Speech with clear phonation and only minor delay. No motor ability to right arm, sharp sensation intact. Left arm normal. Normal left leg sensation/strength. R leg decreased strength but has spontaneous motor activity.  Extremities: no edema, normal tone Skin: dry, intact, normal temperature, normal color. No rashes, lesions or ulcers on exposed skin Psychiatry: normal mood, congruent affect  Labs   I have personally reviewed the following labs and imaging studies CBC    Component Value  Date/Time   WBC 7.9 10/06/2022 1113   RBC 5.78 10/06/2022 1113   HGB 16.3 10/06/2022 1113   HCT 49.1 10/06/2022 1113   PLT 255 10/06/2022 1113   MCV 84.9 10/06/2022 1113   MCH 28.2 10/06/2022 1113   MCHC 33.2 10/06/2022 1113   RDW 13.2 10/06/2022 1113   LYMPHSABS 1.4 10/04/2022 0435   MONOABS 0.4 10/04/2022 0435   EOSABS 0.0 10/04/2022 0435   BASOSABS 0.0 10/04/2022 0435      Latest Ref Rng & Units 10/06/2022   11:13 AM 10/04/2022    4:35 AM 04/23/2021    6:57 AM  BMP  Glucose 70 - 99 mg/dL 841  99  96   BUN 6 - 20 mg/dL 14  13  12    Creatinine 0.61 - 1.24 mg/dL 3.24  4.01  0.27   Sodium 135 - 145 mmol/L 139  142  138   Potassium 3.5 - 5.1 mmol/L 3.6  3.6  3.7   Chloride 98 - 111 mmol/L 108  109  105   CO2 22 - 32 mmol/L 23  25  26    Calcium 8.9 - 10.3 mg/dL 9.2  9.1  8.9  ECHOCARDIOGRAM COMPLETE  Result Date: 10/06/2022    ECHOCARDIOGRAM REPORT   Patient Name:   Ricardo Yang Date of Exam: 10/05/2022 Medical Rec #:  [id number]     Height:       75.0 in Accession #:    [id number]    Weight:       240.5 lb Date of Birth:  [birth year]      BSA:          2.373 m Patient Age:    46 years      BP:           130/85 mmHg Patient Gender: M             HR:           87 bpm. Exam Location:  [facility] Procedure: 2D Echo, Cardiac Doppler, Color Doppler and Intracardiac            Opacification Agent Indications:     Stroke  History:         Patient has prior history of Echocardiogram examinations. CAD,                  Stroke; Risk Factors:Hypertension and Polysubstance Abuse.  Sonographer:     L. Thornton-Maynard Referring Phys:  [id number] Elwyn Lade AGBATA Diagnosing Phys: Alwyn Pea MD  Sonographer Comments: Suboptimal apical window. IMPRESSIONS  1. Left ventricular ejection fraction, by estimation, is <20%. The left ventricle has severely decreased function. The left ventricle demonstrates global hypokinesis. The left ventricular internal cavity size was severely dilated. Left ventricular  diastolic function could not be evaluated.  2. Right ventricular systolic function is mildly reduced. The right ventricular size is mildly enlarged. Mildly increased right ventricular wall thickness. There is normal pulmonary artery systolic pressure.  3. Left atrial size was mildly dilated.  4. Right atrial size was mildly dilated.  5. The mitral valve is normal in structure. Mild mitral valve regurgitation.  6. The aortic valve is normal in structure. Aortic valve regurgitation is trivial. FINDINGS  Left Ventricle: Left ventricular ejection fraction, by estimation, is <20%. The left ventricle has severely decreased function. The left ventricle demonstrates global hypokinesis. Definity contrast agent was given IV to delineate the left ventricular endocardial borders. The left ventricular internal cavity size was severely dilated. There is no left ventricular hypertrophy. Left ventricular diastolic function could not be evaluated. Right Ventricle: The right ventricular size is mildly enlarged. Mildly increased right ventricular wall thickness. Right ventricular systolic function is mildly reduced. There is normal pulmonary artery systolic pressure. The tricuspid regurgitant velocity is 2.25 m/s, and with an assumed right atrial pressure of 3 mmHg, the estimated right ventricular systolic pressure is 23.2 mmHg. Left Atrium: Left atrial size was mildly dilated. Right Atrium: Right atrial size was mildly dilated. Pericardium: There is no evidence of pericardial effusion. Mitral Valve: The mitral valve is normal in structure. Mild mitral valve regurgitation. MV peak gradient, 3.6 mmHg. The mean mitral valve gradient is 2.0 mmHg. Tricuspid Valve: The tricuspid valve is normal in structure. Tricuspid valve regurgitation is mild. Aortic Valve: The aortic valve is normal in structure. Aortic valve regurgitation is trivial. Aortic valve mean gradient measures 2.0 mmHg. Aortic valve peak gradient measures 3.6 mmHg. Aortic  valve area, by VTI measures 3.50 cm. Pulmonic Valve: The pulmonic valve was normal in structure. Pulmonic valve regurgitation is not visualized. Aorta: The ascending aorta was not well visualized. IAS/Shunts: No atrial level shunt detected by color flow Doppler.  LEFT  VENTRICLE PLAX 2D LVIDd:         7.00 cm      Diastology LVIDs:         6.25 cm      LV e' medial:    5.00 cm/s LV PW:         0.80 cm      LV E/e' medial:  17.6 LV IVS:        0.70 cm      LV e' lateral:   6.85 cm/s LVOT diam:     2.40 cm      LV E/e' lateral: 12.9 LV SV:         55 LV SV Index:   23 LVOT Area:     4.52 cm  LV Volumes (MOD) LV vol d, MOD A2C: 188.0 ml LV vol d, MOD A4C: 189.0 ml LV vol s, MOD A2C: 136.0 ml LV vol s, MOD A4C: 188.0 ml LV SV MOD A2C:     52.0 ml LV SV MOD A4C:     189.0 ml LV SV MOD BP:      25.2 ml RIGHT VENTRICLE RV Basal diam:  3.60 cm RV S prime:     17.40 cm/s TAPSE (M-mode): 2.0 cm LEFT ATRIUM              Index        RIGHT ATRIUM           Index LA diam:        4.20 cm  1.77 cm/m   RA Area:     14.70 cm LA Vol (A2C):   113.0 ml 47.61 ml/m  RA Volume:   31.10 ml  13.10 ml/m LA Vol (A4C):   68.2 ml  28.74 ml/m LA Biplane Vol: 88.4 ml  37.25 ml/m  AORTIC VALVE                    PULMONIC VALVE AV Area (Vmax):    3.81 cm     PV Vmax:       0.91 m/s AV Area (Vmean):   3.61 cm     PV Peak grad:  3.3 mmHg AV Area (VTI):     3.50 cm AV Vmax:           94.95 cm/s AV Vmean:          66.250 cm/s AV VTI:            0.156 m AV Peak Grad:      3.6 mmHg AV Mean Grad:      2.0 mmHg LVOT Vmax:         80.03 cm/s LVOT Vmean:        52.833 cm/s LVOT VTI:          0.121 m LVOT/AV VTI ratio: 0.77  AORTA Ao Root diam: 4.00 cm Ao Asc diam:  3.20 cm MITRAL VALVE                  TRICUSPID VALVE MV Area (PHT): 6.51 cm       TR Peak grad:   20.2 mmHg MV Area VTI:   2.78 cm       TR Vmax:        225.00 cm/s MV Peak grad:  3.6 mmHg MV Mean grad:  2.0 mmHg       SHUNTS MV Vmax:       0.94 m/s       Systemic VTI:  0.12 m MV  Vmean:      58.3 cm/s      Systemic Diam: 2.40 cm MV Decel Time: 117 msec MR Peak grad:    66.6 mmHg MR Mean grad:    44.0 mmHg MR Vmax:         408.00 cm/s MR Vmean:        311.0 cm/s MR PISA:         1.57 cm MR PISA Eff ROA: 14 mm MR PISA Radius:  0.50 cm MV E velocity: 88.07 cm/s Alwyn Pea MD Electronically signed by Alwyn Pea MD Signature Date/Time: 10/06/2022/4:41:33 PM    Final     Disposition Plan & Communication  Patient status: Inpatient  Admitted From: Home Planned disposition location: CIR Anticipated discharge date: 12/13 pending clinical improvement and disposition planning. Neurology clearance  Family Communication: partner at bedside   Author: Leeroy Bock, DO [facility] 10/07/2022, 7:57 AM   Available by Epic secure chat 7AM-7PM. If 7PM-7AM, please contact night-coverage.  [facility] contact information found on [email or web address].

## 2022-10-07 NOTE — Progress Notes (Signed)
       CROSS COVER NOTE  NAME: Ricardo Yang MRN: [id number] DOB : 10-Mar-1976 ATTENDING PHYSICIAN: Ricardo Bock, MD    Date of Service   10/07/2022   HPI/Events of Note   7/10 (R) chest pain described as cramping that began ~2 hours ago. The pain begins near (R) coastal margin and extends to just below (R) axilla. Ricardo Yang reports chest pain is worse with inspiration and alleviated by taking shallow breaths and splinting. The pain is not reproducible on palpation.  Endorses ongoing dyspnea and dizziness. Denies palpitations, abdominal pain, nausea, vomiting, or radiation of the pain.  Ricardo Yang is chest pain-free after tramadol and simethicone  Interventions   Assessment/Plan:  Chest Pain, low suspicion for ACS suspect GERD/Musculoskeletal  EKG- unchanged from prior Troponin- 265--->275-->261 Cardiology already consulted, appreciate their recommendations Tramadol Simethicone      This document was prepared using Dragon voice recognition software and may include unintentional dictation errors.  Ricardo Limbo DNP, MBA, FNP-BC Nurse Practitioner [facility] Pager [phone]

## 2022-10-07 NOTE — Progress Notes (Signed)
Inpatient Rehab Admissions Coordinator:   Patient not medically ready yet for CIR admission, pending TEE. Will continue to follow for medical readiness/bed availability.   Rehab Admissons Coordinator [place], [place], [place] [phone]

## 2022-10-08 ENCOUNTER — Inpatient Hospital Stay (HOSPITAL_COMMUNITY)
Admit: 2022-10-08 | Discharge: 2022-10-08 | Disposition: A | Discharge status: Home / Self Care | Payer: Medicaid Other | Attending: Cardiology | Admitting: Cardiology

## 2022-10-08 ENCOUNTER — Encounter
Admission: EM | Disposition: A | Discharge status: Rehabilitation Facility | Payer: Self-pay | Source: Home / Self Care | Attending: Student in an Organized Health Care Education/Training Program

## 2022-10-08 ENCOUNTER — Other Ambulatory Visit (HOSPITAL_COMMUNITY): Payer: Self-pay

## 2022-10-08 DIAGNOSIS — F1910 Other psychoactive substance abuse, uncomplicated: Secondary | ICD-10-CM

## 2022-10-08 DIAGNOSIS — I340 Nonrheumatic mitral (valve) insufficiency: Secondary | ICD-10-CM

## 2022-10-08 DIAGNOSIS — I6389 Other cerebral infarction: Secondary | ICD-10-CM

## 2022-10-08 DIAGNOSIS — I5020 Unspecified systolic (congestive) heart failure: Secondary | ICD-10-CM

## 2022-10-08 DIAGNOSIS — I361 Nonrheumatic tricuspid (valve) insufficiency: Secondary | ICD-10-CM

## 2022-10-08 HISTORY — PX: TEE WITHOUT CARDIOVERSION: SHX5443

## 2022-10-08 LAB — BASIC METABOLIC PANEL
Anion gap: 6 (ref 5–15)
BUN: 15 mg/dL (ref 6–20)
CO2: 25 mmol/L (ref 22–32)
Calcium: 8.9 mg/dL (ref 8.9–10.3)
Chloride: 107 mmol/L (ref 98–111)
Creatinine, Ser: 0.83 mg/dL (ref 0.61–1.24)
GFR, Estimated: >60 mL/min (ref >60)
Glucose, Bld: 110 mg/dL — ABNORMAL HIGH (ref 70–99)
Potassium: 3.8 mmol/L (ref 3.5–5.1)
Sodium: 138 mmol/L (ref 135–145)

## 2022-10-08 LAB — CBC
HCT: 47.7 % (ref 39.0–52.0)
Hemoglobin: 15.5 g/dL (ref 13.0–17.0)
MCH: 27.8 pg (ref 26.0–34.0)
MCHC: 32.5 g/dL (ref 30.0–36.0)
MCV: 85.5 fL (ref 80.0–100.0)
Platelets: 252 10*3/uL (ref 150–400)
RBC: 5.58 MIL/uL (ref 4.22–5.81)
RDW: 13.3 % (ref 11.5–15.5)
WBC: 11.9 10*3/uL — ABNORMAL HIGH (ref 4.0–10.5)
nRBC: 0 % (ref 0.0–0.2)

## 2022-10-08 LAB — TROPONIN I (HIGH SENSITIVITY)
Troponin I (High Sensitivity): 275 ng/L (ref <18)
Troponin I (High Sensitivity): 261 ng/L (ref <18)

## 2022-10-08 SURGERY — ECHOCARDIOGRAM, TRANSESOPHAGEAL
Anesthesia: Moderate Sedation

## 2022-10-08 MED ORDER — LIDOCAINE VISCOUS HCL 2 % MT SOLN
15.0000 mL | Freq: Once | OROMUCOSAL | Status: AC
  Administered 2022-10-08: 15 mL via OROMUCOSAL

## 2022-10-08 MED ORDER — LORAZEPAM 2 MG/ML IJ SOLN
INTRAMUSCULAR | Status: AC | PRN
  Administered 2022-10-08: 1 mg via INTRAVENOUS

## 2022-10-08 MED ORDER — SODIUM CHLORIDE 0.9 % IV SOLN: INTRAVENOUS | Status: DC

## 2022-10-08 MED ORDER — FENTANYL CITRATE (PF) 100 MCG/2ML IJ SOLN
INTRAMUSCULAR | Status: AC
  Filled 2022-10-08: qty 2

## 2022-10-08 MED ORDER — FENTANYL CITRATE (PF) 100 MCG/2ML IJ SOLN
INTRAMUSCULAR | Status: AC | PRN
  Administered 2022-10-08: 25 ug via INTRAVENOUS

## 2022-10-08 MED ORDER — LIDOCAINE VISCOUS HCL 2 % MT SOLN
OROMUCOSAL | Status: AC
  Filled 2022-10-08: qty 15

## 2022-10-08 MED ORDER — MIDAZOLAM HCL 2 MG/2ML IJ SOLN
INTRAMUSCULAR | Status: AC
  Filled 2022-10-08: qty 4

## 2022-10-08 MED ORDER — METOPROLOL SUCCINATE ER 25 MG PO TB24
12.5000 mg | ORAL_TABLET | Freq: Every day | ORAL | Status: DC
  Administered 2022-10-08 – 2022-10-09 (×2): 12.5 mg via ORAL
  Filled 2022-10-08 (×2): qty 1

## 2022-10-08 MED ORDER — LISINOPRIL 5 MG PO TABS
2.5000 mg | ORAL_TABLET | Freq: Every day | ORAL | Status: DC
  Administered 2022-10-08 – 2022-10-09 (×2): 2.5 mg via ORAL
  Filled 2022-10-08 (×2): qty 1

## 2022-10-08 MED ORDER — LACTULOSE 10 GM/15ML PO SOLN
20.0000 g | Freq: Once | ORAL | Status: AC
  Administered 2022-10-08: 20 g via ORAL
  Filled 2022-10-08: qty 30

## 2022-10-08 MED ORDER — DAPAGLIFLOZIN PROPANEDIOL 10 MG PO TABS
10.0000 mg | ORAL_TABLET | Freq: Every day | ORAL | Status: DC
  Administered 2022-10-08 – 2022-10-09 (×2): 10 mg via ORAL
  Filled 2022-10-08 (×2): qty 1

## 2022-10-08 NOTE — Progress Notes (Signed)
Occupational Therapy Treatment Patient Details Name: Ricardo Yang MRN: [id number] DOB: Feb 04, 1976 Today's Date: 10/08/2022   History of present illness 46 year old male with left MCA territory infarct. PMH significant for prior CVA with no deficits, coronary artery disease with complications of ischemic cardiomyopathy   OT comments  Ricardo Yang was seen for OT treatment on this date. Upon arrival to room pt reclined in bed, recently returned from TEE procedure and reports very fatigued / sedated however agreeable to bed level tx focused on RUE neuromuscular re-education. Pt tolerated place and hold exercises maintiaining wrist in neutral and elbow at 90*. Completes exercises in gravity eliminated position with RUE on pillow - elbow flexion/extension AROM x10 reps, AAROM with minimal LUE assist x10 reps as fatigued. x20 reps shoulder shrugs. Educated on HEP and importance of repetition t/o day. Pt making good progress toward goals, will continue to follow POC. Discharge recommendation remains appropriate.     Recommendations for follow up therapy are one component of a multi-disciplinary discharge planning process, led by the attending physician.  Recommendations may be updated based on patient status, additional functional criteria and insurance authorization.    Follow Up Recommendations  Acute inpatient rehab (3hours/day)     Assistance Recommended at Discharge Frequent or constant Supervision/Assistance  Patient can return home with the following  Two people to help with walking and/or transfers;Two people to help with bathing/dressing/bathroom   Equipment Recommendations  Other (comment) (defer)    Recommendations for Other Services Rehab consult    Precautions / Restrictions Precautions Precautions: Shoulder;Fall Restrictions Weight Bearing Restrictions: No       Mobility Bed Mobility               General bed mobility comments: deferred 2/2 fatigue from recent  procedure                       ADL either performed or assessed with clinical judgement   ADL Overall ADL's : Needs assistance/impaired                                       General ADL Comments: MOD A simulated grooming bed level     Cognition Arousal/Alertness: Awake/alert Behavior During Therapy: WFL for tasks assessed/performed Overall Cognitive Status: Within Functional Limits for tasks assessed                                 General Comments: eyes closing wihtout stimulation at end of session - very recently returned from TEE procedure        Exercises Other Exercises Other Exercises: Tolerated place and hold exercises with maintiaining wrist in neutral and elbow at 90*. Other Exercises: Gravity eliminated position pt completed elbow flexion/extension AROM x10 reps, AAROM with LUE assist x10 reps as fatigued. x20 reps shoulder shrugs     Pertinent Vitals/ Pain       Pain Assessment Pain Assessment: No/denies pain   Frequency  Min 5X/week        Progress Toward Goals  OT Goals(current goals can now be found in the care plan section)  Progress towards OT goals: Progressing toward goals  Acute Rehab OT Goals Patient Stated Goal: to use R hand OT Goal Formulation: With patient/family Time For Goal Achievement: 10/18/22 Potential to Achieve Goals: Good ADL Goals Pt Will Perform  Grooming: sitting;with set-up Pt Will Perform Lower Body Dressing: with min assist;sitting/lateral leans Pt Will Transfer to Toilet: bedside commode;stand pivot transfer;with mod assist Pt Will Perform Toileting - Clothing Manipulation and hygiene: with min assist;sitting/lateral leans Pt/caregiver will Perform Home Exercise Program: Increased ROM;Increased strength;Right Upper extremity;With written HEP provided  Plan Discharge plan remains appropriate;Frequency remains appropriate    Co-evaluation                 AM-PAC OT "6  Clicks" Daily Activity     Outcome Measure   Help from another person eating meals?: A Little Help from another person taking care of personal grooming?: A Little Help from another person toileting, which includes using toliet, bedpan, or urinal?: A Lot Help from another person bathing (including washing, rinsing, drying)?: A Lot Help from another person to put on and taking off regular upper body clothing?: A Lot Help from another person to put on and taking off regular lower body clothing?: A Lot 6 Click Score: 14    End of Session    OT Visit Diagnosis: Other abnormalities of gait and mobility (R26.89);Muscle weakness (generalized) (M62.81);Hemiplegia and hemiparesis Hemiplegia - Right/Left: Right Hemiplegia - dominant/non-dominant: Dominant Hemiplegia - caused by: Cerebral infarction   Activity Tolerance Patient tolerated treatment well;Patient limited by fatigue   Patient Left in bed;with call bell/phone within reach;with bed alarm set   Nurse Communication          Time: 1552-0802 OT Time Calculation (min): 20 min  Charges: OT General Charges $OT Visit: 1 Visit OT Treatments $Neuromuscular Re-education: 8-22 mins  Kathie Dike, M.S. OTR/L  10/08/22, 2:12 PM  ascom [phone]

## 2022-10-08 NOTE — Progress Notes (Signed)
Physical Therapy Treatment Patient Details Name: Ricardo Yang MRN: [id number] DOB: 1976-09-29 Today's Date: 10/08/2022   History of Present Illness 46 year old male with left MCA territory infarct. PMH significant for prior CVA with no deficits, coronary artery disease with complications of ischemic cardiomyopathy    PT Comments    PT session modified due to pt returning from TEE lethargic, therefore focused on B LE strengthening and ROM exercises as well as hamstring and heelcord stretches to maintain joint range. Continue to recommend CIR once medically cleared. Will attempt to see pt tomorrow am.   Recommendations for follow up therapy are one component of a multi-disciplinary discharge planning process, led by the attending physician.  Recommendations may be updated based on patient status, additional functional criteria and insurance authorization.  Follow Up Recommendations  Acute inpatient rehab (3hours/day)     Assistance Recommended at Discharge Frequent or constant Supervision/Assistance  Patient can return home with the following A lot of help with walking and/or transfers;A lot of help with bathing/dressing/bathroom   Equipment Recommendations       Recommendations for Other Services       Precautions / Restrictions Precautions Precautions: Shoulder;Fall Type of Shoulder Precautions: Patient does not have shoulder subluxation at this time, however has flaccid RUE, support shoulder joint appropriately. Restrictions Weight Bearing Restrictions: No     Mobility  Bed Mobility               General bed mobility comments: deferred 2/2 fatigue from recent procedure    Transfers                        Ambulation/Gait                   Stairs             Wheelchair Mobility    Modified Rankin (Stroke Patients Only)       Balance                                            Cognition Arousal/Alertness:  Lethargic Behavior During Therapy: WFL for tasks assessed/performed Overall Cognitive Status: Within Functional Limits for tasks assessed                                 General Comments: eyes closing wihtout stimulation at end of session - very recently returned from TEE procedure        Exercises General Exercises - Lower Extremity Ankle Circles/Pumps: AROM, Left, AAROM, Right, 20 reps Heel Slides: AROM, Left, AAROM, Right, 10 reps Hip ABduction/ADduction: AROM, Left, AAROM, Right, 10 reps Other Exercises Other Exercises: AAROM R hip IR/ER x 10 Other Exercises: B heelcord and hamstring stretches    General Comments General comments (skin integrity, edema, etc.):  (Pt educated on role of PT and set goals. Discussed proper positioning to maintain skin integrity.)      Pertinent Vitals/Pain Pain Assessment Pain Assessment: No/denies pain    Home Living                          Prior Function            PT Goals (current goals can now be found in the care plan section) Acute Rehab  PT Goals Patient Stated Goal: very eager for discharge to AIR    Frequency    7X/week      PT Plan Current plan remains appropriate    Co-evaluation              AM-PAC PT "6 Clicks" Mobility   Outcome Measure  Help needed turning from your back to your side while in a flat bed without using bedrails?: A Little Help needed moving from lying on your back to sitting on the side of a flat bed without using bedrails?: A Little Help needed moving to and from a bed to a chair (including a wheelchair)?: A Lot Help needed standing up from a chair using your arms (e.g., wheelchair or bedside chair)?: A Lot Help needed to walk in hospital room?: A Lot Help needed climbing 3-5 steps with a railing? : A Lot 6 Click Score: 14    End of Session     Patient left: in bed;with call bell/phone within reach;with bed alarm set;with family/visitor present;with SCD's  reapplied Nurse Communication: Mobility status PT Visit Diagnosis: Muscle weakness (generalized) (M62.81);Difficulty in walking, not elsewhere classified (R26.2);Hemiplegia and hemiparesis;Unsteadiness on feet (R26.81);Other abnormalities of gait and mobility (R26.89) Hemiplegia - Right/Left: Right Hemiplegia - dominant/non-dominant: Dominant Hemiplegia - caused by: Cerebral infarction     Time: 1500-1520 PT Time Calculation (min) (ACUTE ONLY): 20 min  Charges:  $Therapeutic Exercise: 8-22 mins                    Zadie Cleverly, PTA    Jannet Askew 10/08/2022, 3:56 PM

## 2022-10-08 NOTE — Procedures (Signed)
Transesophageal Echocardiogram :  Indication: stroke  Procedure: 6ml of viscous lidocaine were given orally to provide local anesthesia to the oropharynx. The patient was positioned supine on the left side, bite block provided. The patient was moderately sedated with the doses of versed and fentanyl as detailed below.  Using digital technique an omniplane probe was advanced into the esophagus without incident.   Moderate sedation: 1. Sedation used:  Versed: 2 mg, Fentanyl: 25 mcg 2. Time administered:  12:10  Time when patient started recovery: 12:20 3. I was face to face during this time 10 mins  See report in EPIC  for complete details: In brief, imaging revealed severely reduced systolic function and no mural apical thrombus.  .  Estimated ejection fraction was 20%. .  Imaging of the septum showed no ASD or VSD Bubble study was negative for shunt 2D and color flow confirmed no PFO  The LA was well visualized in orthogonal views.  There was no spontaneous contrast and no thrombus in the LA and LA appendage   The descending thoracic aorta had no  mural aortic debris with no evidence of aneurysmal dilation or dissection  Conclusion: No evidence for thrombus or cardiac etiology for stroke. Severely reduced EF. Cessation of cocaine use advised.   Arlys John Agbor-Etang 10/08/2022 12:27 PM

## 2022-10-08 NOTE — Hospital Course (Signed)
Ricardo Yang is a 46 y.o. male with a PMH significant for prior CVA with no deficits, coronary artery disease with complications of ischemic cardiomyopathy with last known LVEF of 25 to 30% from a 2D echocardiogram which was done 06/23, hypertension and nicotine dependence. Presented from home to the ED via EMS on 10/04/2022 with right sided weakness and global aphagia occurring overnight. Pt's partner called EMS and he was transported to hospital with code stroke. CT head showed early changes in the left parietal region, CT angiogram of the neck and head did not show a large vessel obstruction. Brain MRI positive for acute infarcts of left ACA, left MCA and likley right PCA territory with large infarct involving left postcentral gyrus as well as other abnormalities (see report for further detail) pointing to embolic source. He was also found to be cocaine positive on presentation. Echocardiogram showed ejection fraction 20 to 25%, This is grossly worsened from prior study 6/22 with EF 25-30%. No signs of vegetations/arrhythmia TEE performed on 12/13, no vegetation preserved. Recorder will be placed by cardiology.

## 2022-10-08 NOTE — Progress Notes (Signed)
  Progress Note   Patient: Ricardo Yang FGH:[id number] DOB: 10-11-1976 DOA: 10/04/2022     4 DOS: the patient was seen and examined on 10/08/2022   Brief hospital course: Ricardo Yang is a 46 y.o. male with a PMH significant for prior CVA with no deficits, coronary artery disease with complications of ischemic cardiomyopathy with last known LVEF of 25 to 30% from a 2D echocardiogram which was done 06/23, hypertension and nicotine dependence. Presented from home to the ED via EMS on 10/04/2022 with right sided weakness and global aphagia occurring overnight. Pt's partner called EMS and he was transported to hospital with code stroke. CT head showed early changes in the left parietal region, CT angiogram of the neck and head did not show a large vessel obstruction. Brain MRI positive for acute infarcts of left ACA, left MCA and likley right PCA territory with large infarct involving left postcentral gyrus as well as other abnormalities (see report for further detail) pointing to embolic source. He was also found to be cocaine positive on presentation. Echocardiogram showed ejection fraction 20 to 25%, This is grossly worsened from prior study 6/22 with EF 25-30%. No signs of vegetations/arrhythmia TEE performed on 12/13, no vegetation preserved. Recorder will be placed by cardiology.  Assessment and Plan: * Acute CVA (cerebrovascular accident) [facility]) Imaging studies as above, patient condition is improving.  TEE did not show any thrombus. Will continue aspirin, Plavix and atorvastatin. Patient is pending transfer to CIR.  Polysubstance abuse (HCC) No evidence of withdrawal.  CAD (coronary artery disease) Chronic systolic congestive heart failure with ejection fraction 20 to 25%. Patient does not have any volume overload.  No acute coronary syndrome.  Continue to follow, appreciate cardiology consult.   Essential hypertension Patient is a 4 days out from the initial stroke, will gradually add  back blood pressure medicines.  Obesity With BMI 30.7.     Subjective:  Patient still has significant right-sided weakness, no dysphagia.  Physical Exam: Vitals:   10/08/22 1220 10/08/22 1230 10/08/22 1245 10/08/22 1317  BP: (!) 127/91 128/87 122/86 114/87  Pulse: 77 73 76 72  Resp: (!) 24 13 (!) 22 18  Temp:      TempSrc:      SpO2: 95% 95% 93% 99%  Weight:      Height:       General exam: Appears calm and comfortable  Respiratory system: Clear to auscultation. Respiratory effort normal. Cardiovascular system: S1 & S2 heard, RRR. No JVD, murmurs, rubs, gallops or clicks. No pedal edema. Gastrointestinal system: Abdomen is nondistended, soft and nontender. No organomegaly or masses felt. Normal bowel sounds heard. Central nervous system: Alert and oriented.  Weakness. Extremities: Symmetric 5 x 5 power. Skin: No rashes, lesions or ulcers Psychiatry: Judgement and insight appear normal. Mood & affect appropriate.  ' Data Reviewed:  Reviewed MRI results, CT results as well as lab results  Family Communication: Fianc at bedside updated.  Disposition: Status is: Inpatient Remains inpatient appropriate because: Unsafe discharge, pending transfer to CIR.  Planned Discharge Destination: Rehab    Time spent: 35 minutes  Author: Marrion Coy, MD 10/08/2022 1:46 PM  For on call review [email or web address].

## 2022-10-08 NOTE — Progress Notes (Signed)
[facility] CLINIC CARDIOLOGY CONSULT NOTE       Patient ID: Ricardo Yang MRN: [id number] DOB/AGE: 48/19/1977 48 y.o.  Admit date: 10/04/2022 Referring Physician Dr. Jamelle Rushing Primary Physician  Primary Cardiologist Dr. Darrold Junker (last seen during 2022 admission) Reason for Consultation reduced EF   HPI: Ricardo Yang is a 46yoM with a PMH of anterior STEMI s/p PCI with DES prox LAD (07/2019), medically managed NSTEMI w/ LHC patent prox LAD stent, 100% sm-mod caliber OM2, 50% mid RCA, ost - prox LAD 20% (03/2021), HFrEF (<20%, global hypo 09/2022, prev 25-30% 03/2021), obesity, polysubstance abuse (cocaine, tobacco), hx CVA who presented to [facility] ED 10/04/22 with right sided hemiplegia and expressive aphasia. Code Stroke activated in the ED, no LVO on CT. He did not receive thrombolytics. CTA head and neck with left MCA territory infarct. Cardiology is consulted on hospital day 3 for assistance with his HF and to arrange TEE.   Interval History:  - overnight events noted, reported "right sided chest pain" that was pleuritic in nature, occurred after eating a hot dog - EKG non acute with stable appearance compared to priors, troponins elevated but flat (265, 275, 261). Relieved with simethicone and tramadol. When I examined this morning, he localizes the pain to the RUQ of his abdomen and felt like "gas pain." Not similar in character at all to his prior MI or angina.  - overall feels ok, remains with significant RUE weakness. No palpitations, significant shortness of breath  - npo for TEE at 12pm today   Review of systems complete and found to be negative unless listed above     Past Medical History:  Diagnosis Date   Multiple sclerosis (HCC)    a. question possible MS   Obesity    Polysubstance abuse (HCC)    a. tobacco, cocaine, crack, ecstasy, pills, "anything but injections"    Past Surgical History:  Procedure Laterality Date   CARDIAC CATHETERIZATION N/A 05/28/2015    Procedure: Left Heart Cath and Coronary Angiography;  Surgeon: Antonieta Iba, MD;  Location: [facility] INVASIVE CV LAB;  Service: Cardiovascular;  Laterality: N/A;   CORONARY ANGIOGRAPHY N/A 04/22/2021   Procedure: CORONARY ANGIOGRAPHY;  Surgeon: Marcina Millard, MD;  Location: [facility] INVASIVE CV LAB;  Service: Cardiovascular;  Laterality: N/A;   CORONARY/GRAFT ACUTE MI REVASCULARIZATION N/A 08/06/2019   Procedure: Coronary/Graft Acute MI Revascularization;  Surgeon: Marcina Millard, MD;  Location: [facility] INVASIVE CV LAB;  Service: Cardiovascular;  Laterality: N/A;   LEFT HEART CATH N/A 04/22/2021   Procedure: Left Heart Cath;  Surgeon: Marcina Millard, MD;  Location: [facility] INVASIVE CV LAB;  Service: Cardiovascular;  Laterality: N/A;   LEFT HEART CATH AND CORONARY ANGIOGRAPHY N/A 08/06/2019   Procedure: LEFT HEART CATH AND CORONARY ANGIOGRAPHY;  Surgeon: Marcina Millard, MD;  Location: [facility] INVASIVE CV LAB;  Service: Cardiovascular;  Laterality: N/A;    Medications Prior to Admission  Medication Sig Dispense Refill Last Dose   aspirin 81 MG chewable tablet Chew 1 tablet (81 mg total) by mouth daily. (Patient not taking: Reported on 10/04/2022) 30 tablet 0 Not Taking   atorvastatin (LIPITOR) 80 MG tablet Take 1 tablet (80 mg total) by mouth daily at 6 PM. (Patient not taking: Reported on 10/04/2022) 30 tablet 0 Not Taking   clopidogrel (PLAVIX) 75 MG tablet Take 1 tablet (75 mg total) by mouth daily with breakfast. (Patient not taking: Reported on 10/04/2022) 30 tablet 0 Not Taking   lisinopril (ZESTRIL) 5 MG tablet Take (1/2) tablet (  2.5 mg total) by mouth daily. (Patient not taking: Reported on 10/04/2022) 15 tablet 0 Not Taking   metoprolol succinate (TOPROL-XL) 25 MG 24 hr tablet Take 1 tablet (25 mg total) by mouth daily. Take with or immediately following a meal. (Patient not taking: Reported on 10/04/2022) 30 tablet 0 Not Taking   naproxen (NAPROSYN) 500 MG tablet Take 1 tablet (500 mg  total) by mouth 2 (two) times daily with a meal. (Patient not taking: Reported on 10/04/2022) 30 tablet 0 Not Taking   traMADol (ULTRAM) 50 MG tablet Take 1 tablet (50 mg total) by mouth every 6 (six) hours as needed. (Patient not taking: Reported on 10/04/2022) 12 tablet 0 Not Taking   Social History   Socioeconomic History   Marital status: Single    Spouse name: Not on file   Number of children: Not on file   Years of education: Not on file   Highest education level: Not on file  Occupational History   Not on file  Tobacco Use   Smoking status: Every Day   Smokeless tobacco: Never  Substance and Sexual Activity   Alcohol use: No    Alcohol/week: 0.0 standard drinks of alcohol   Drug use: Yes    Frequency: 1.0 times per week    Types: "Crack" cocaine, Amphetamines, Cocaine, Hashish, Marijuana, MDMA (Ecstacy)   Sexual activity: Yes    Partners: Female  Other Topics Concern   Not on file  Social History Narrative   Not on file   Social Determinants of Health   Financial Resource Strain: Not on file  Food Insecurity: Not on file  Transportation Needs: Not on file  Physical Activity: Not on file  Stress: Not on file  Social Connections: Not on file  Intimate Partner Violence: Not on file    Family History  Problem Relation Age of Onset   Heart disease Father       Intake/Output Summary (Last 24 hours) at 10/08/2022 0753 Last data filed at 10/07/2022 1800 Gross per 24 hour  Intake 840 ml  Output 1775 ml  Net -935 ml     Vitals:   10/07/22 1631 10/07/22 2030 10/07/22 2338 10/08/22 0402  BP: 126/86 (!) 131/91 128/80 118/81  Pulse: 79 82 86 78  Resp: 19 18 18 20   Temp: 98.6 F (37 C) 98.6 F (37 C) 98.3 F (36.8 C) 98.1 F (36.7 C)  TempSrc: Oral Oral Oral Oral  SpO2: 98% 99% 99% 98%  Weight:      Height:        PHYSICAL EXAM General: Pleasant middle-aged black male, well nourished, in no acute distress.  Laying in bed at slight incline.  HEENT:   Normocephalic and atraumatic. Neck:  No JVD.  Lungs: Normal respiratory effort on room air. Clear bilaterally to auscultation. No wheezes, crackles, rhonchi.  Heart: HRRR . Normal S1 and S2 without gallops or murmurs.  Abdomen: Non-distended appearing. Mild tenderness to palpation of RUQ without rebound or guarding. Msk: Right upper extremity weakness Extremities: Warm and well perfused. No clubbing, cyanosis.  Very trace right lower extremity edema.  Neuro: Alert and oriented X 3.  Slight slurring of speech, overall easily understandable but not at baseline Psych:  Answers questions appropriately.   Labs: Basic Metabolic Panel: Recent Labs    10/06/22 1113 10/08/22 0408  NA 139 138  K 3.6 3.8  CL 108 107  CO2 23 25  GLUCOSE 119* 110*  BUN 14 15  CREATININE 0.81 0.83  CALCIUM 9.2 8.9    Liver Function Tests: Recent Labs    10/06/22 1113  AST 26  ALT 16  ALKPHOS 48  BILITOT 1.1  PROT 7.3  ALBUMIN 3.6    No results for input(s): "LIPASE", "AMYLASE" in the last 72 hours. CBC: Recent Labs    10/06/22 1113 10/08/22 0408  WBC 7.9 11.9*  HGB 16.3 15.5  HCT 49.1 47.7  MCV 84.9 85.5  PLT 255 252    Cardiac Enzymes: Recent Labs    10/07/22 2238 10/08/22 0024 10/08/22 0408  TROPONINIHS 265* 275* 261*   BNP: No results for input(s): "BNP" in the last 72 hours. D-Dimer: No results for input(s): "DDIMER" in the last 72 hours. Hemoglobin A1C: No results for input(s): "HGBA1C" in the last 72 hours. Fasting Lipid Panel: No results for input(s): "CHOL", "HDL", "LDLCALC", "TRIG", "CHOLHDL", "LDLDIRECT" in the last 72 hours.  Thyroid Function Tests: No results for input(s): "TSH", "T4TOTAL", "T3FREE", "THYROIDAB" in the last 72 hours.  Invalid input(s): "FREET3" Anemia Panel: No results for input(s): "VITAMINB12", "FOLATE", "FERRITIN", "TIBC", "IRON", "RETICCTPCT" in the last 72 hours.   Radiology: ECHOCARDIOGRAM COMPLETE  Result Date: 10/06/2022     ECHOCARDIOGRAM REPORT   Patient Name:   Ricardo L Yang Date of Exam: 10/05/2022 Medical Rec #:  [id number]     Height:       75.0 in Accession #:    [id number]    Weight:       240.5 lb Date of Birth:  12-22-1975      BSA:          2.373 m Patient Age:    46 years      BP:           130/85 mmHg Patient Gender: M             HR:           87 bpm. Exam Location:  [facility] Procedure: 2D Echo, Cardiac Doppler, Color Doppler and Intracardiac            Opacification Agent Indications:     Stroke  History:         Patient has prior history of Echocardiogram examinations. CAD,                  Stroke; Risk Factors:Hypertension and Polysubstance Abuse.  Sonographer:     L. Thornton-Maynard Referring Phys:  [id number] Elwyn Lade AGBATA Diagnosing Phys: Alwyn Pea MD  Sonographer Comments: Suboptimal apical window. IMPRESSIONS  1. Left ventricular ejection fraction, by estimation, is <20%. The left ventricle has severely decreased function. The left ventricle demonstrates global hypokinesis. The left ventricular internal cavity size was severely dilated. Left ventricular diastolic function could not be evaluated.  2. Right ventricular systolic function is mildly reduced. The right ventricular size is mildly enlarged. Mildly increased right ventricular wall thickness. There is normal pulmonary artery systolic pressure.  3. Left atrial size was mildly dilated.  4. Right atrial size was mildly dilated.  5. The mitral valve is normal in structure. Mild mitral valve regurgitation.  6. The aortic valve is normal in structure. Aortic valve regurgitation is trivial. FINDINGS  Left Ventricle: Left ventricular ejection fraction, by estimation, is <20%. The left ventricle has severely decreased function. The left ventricle demonstrates global hypokinesis. Definity contrast agent was given IV to delineate the left ventricular endocardial borders. The left ventricular internal cavity size was severely dilated. There is no left ventricular  hypertrophy. Left ventricular diastolic function  could not be evaluated. Right Ventricle: The right ventricular size is mildly enlarged. Mildly increased right ventricular wall thickness. Right ventricular systolic function is mildly reduced. There is normal pulmonary artery systolic pressure. The tricuspid regurgitant velocity is 2.25 m/s, and with an assumed right atrial pressure of 3 mmHg, the estimated right ventricular systolic pressure is 23.2 mmHg. Left Atrium: Left atrial size was mildly dilated. Right Atrium: Right atrial size was mildly dilated. Pericardium: There is no evidence of pericardial effusion. Mitral Valve: The mitral valve is normal in structure. Mild mitral valve regurgitation. MV peak gradient, 3.6 mmHg. The mean mitral valve gradient is 2.0 mmHg. Tricuspid Valve: The tricuspid valve is normal in structure. Tricuspid valve regurgitation is mild. Aortic Valve: The aortic valve is normal in structure. Aortic valve regurgitation is trivial. Aortic valve mean gradient measures 2.0 mmHg. Aortic valve peak gradient measures 3.6 mmHg. Aortic valve area, by VTI measures 3.50 cm. Pulmonic Valve: The pulmonic valve was normal in structure. Pulmonic valve regurgitation is not visualized. Aorta: The ascending aorta was not well visualized. IAS/Shunts: No atrial level shunt detected by color flow Doppler.  LEFT VENTRICLE PLAX 2D LVIDd:         7.00 cm      Diastology LVIDs:         6.25 cm      LV e' medial:    5.00 cm/s LV PW:         0.80 cm      LV E/e' medial:  17.6 LV IVS:        0.70 cm      LV e' lateral:   6.85 cm/s LVOT diam:     2.40 cm      LV E/e' lateral: 12.9 LV SV:         55 LV SV Index:   23 LVOT Area:     4.52 cm  LV Volumes (MOD) LV vol d, MOD A2C: 188.0 ml LV vol d, MOD A4C: 189.0 ml LV vol s, MOD A2C: 136.0 ml LV vol s, MOD A4C: 188.0 ml LV SV MOD A2C:     52.0 ml LV SV MOD A4C:     189.0 ml LV SV MOD BP:      25.2 ml RIGHT VENTRICLE RV Basal diam:  3.60 cm RV S prime:     17.40 cm/s  TAPSE (M-mode): 2.0 cm LEFT ATRIUM              Index        RIGHT ATRIUM           Index LA diam:        4.20 cm  1.77 cm/m   RA Area:     14.70 cm LA Vol (A2C):   113.0 ml 47.61 ml/m  RA Volume:   31.10 ml  13.10 ml/m LA Vol (A4C):   68.2 ml  28.74 ml/m LA Biplane Vol: 88.4 ml  37.25 ml/m  AORTIC VALVE                    PULMONIC VALVE AV Area (Vmax):    3.81 cm     PV Vmax:       0.91 m/s AV Area (Vmean):   3.61 cm     PV Peak grad:  3.3 mmHg AV Area (VTI):     3.50 cm AV Vmax:           94.95 cm/s AV Vmean:  66.250 cm/s AV VTI:            0.156 m AV Peak Grad:      3.6 mmHg AV Mean Grad:      2.0 mmHg LVOT Vmax:         80.03 cm/s LVOT Vmean:        52.833 cm/s LVOT VTI:          0.121 m LVOT/AV VTI ratio: 0.77  AORTA Ao Root diam: 4.00 cm Ao Asc diam:  3.20 cm MITRAL VALVE                  TRICUSPID VALVE MV Area (PHT): 6.51 cm       TR Peak grad:   20.2 mmHg MV Area VTI:   2.78 cm       TR Vmax:        225.00 cm/s MV Peak grad:  3.6 mmHg MV Mean grad:  2.0 mmHg       SHUNTS MV Vmax:       0.94 m/s       Systemic VTI:  0.12 m MV Vmean:      58.3 cm/s      Systemic Diam: 2.40 cm MV Decel Time: 117 msec MR Peak grad:    66.6 mmHg MR Mean grad:    44.0 mmHg MR Vmax:         408.00 cm/s MR Vmean:        311.0 cm/s MR PISA:         1.57 cm MR PISA Eff ROA: 14 mm MR PISA Radius:  0.50 cm MV E velocity: 88.07 cm/s Dwayne D Callwood MD Electronically signed by Alwyn Pea MD Signature Date/Time: 10/06/2022/4:41:33 PM    Final    MR BRAIN WO CONTRAST  Result Date: 10/04/2022 CLINICAL DATA:  Stroke suspected EXAM: MRI HEAD WITHOUT CONTRAST TECHNIQUE: Multiplanar, multiecho pulse sequences of the brain and surrounding structures were obtained without intravenous contrast. COMPARISON:  Same day CT brain and head/neck angiogram. MRI Brain 05/21/2009 FINDINGS: Brain: Acute infarcts in the left ACA, left MCA, and likely the right PCA territory infarcts. There is a large infarct involving the left  postcentral gyrus (series 5002, image 42). Additional infarcts are also seen involving the medial cortex of the left frontal lobe and lateral aspect of the left frontal lobe (series 5002, image 31). There is also a small infarct in the posterior right temporal lobe (series 5002, image 27). Redemonstrated periventricular and subcortical T2/FLAIR hyperintense signal, some of which appear triangular and perpendicular (series 24401, image 30), unchanged from prior. There is a focal region in the right occipital lobe where there is incomplete suppression of FLAIR signal (sereis 15001, image 28) with associated linear susceptibility artifact (series 02725, image 27). Small amount of T2/FLAIR hyperintense signal is also seen at the root entry zone of the right trigeminal nerve (series 36644, image 16). No extra-axial fluid collection. No hydrocephalus. Vascular: Normal flow voids. Skull and upper cervical spine: Normal marrow signal. Sinuses/Orbits: Negative. Other: None. IMPRESSION: 1. Acute infarcts in the left ACA, left MCA, and likely the right PCA territory, with a large infarct involving the left postcentral gyrus. Given distribution, these are favored to represent infarcts related to a central embolic etiology. There is a focal region in the right occipital lobe where there is likely a small amount of subarachnoid blood products, which could suggest an underlying component of vasculitis. Recommend further evaluation with echocardiography. 2. Redemonstrated periventricular and  subcortical T2/FLAIR hyperintense signal, as well as at the root entry zone of the right trigeminal nerve, which are suggestive of an underlying demyelinating disease. Electronically Signed   By: Lorenza Cambridge M.D.   On: 10/04/2022 13:23   DG Abd 1 View  Result Date: 10/04/2022 CLINICAL DATA:  Screening for metallic foreign body EXAM: ABDOMEN - 1 VIEW COMPARISON:  None Available. FINDINGS: Nonobstructive bowel gas pattern. Contrast opacify  the urinary bladder. Lung bases are clear. No metallic foreign body is visualized. No acute osseous abnormality. IMPRESSION: No metallic foreign body is visualized. Electronically Signed   By: Lorenza Cambridge M.D.   On: 10/04/2022 12:02   DG Chest 1 View  Result Date: 10/04/2022 CLINICAL DATA:  Stroke EXAM: CHEST  1 VIEW COMPARISON:  CXR 04/20/21, CXR 08/06/19 FINDINGS: No pleural effusion. No pneumothorax. No focal airspace opacity. Low lung volumes. There is a least mild interval increase in size of the cardiac contours compared to 04/20/21 even when accounting for differences in technique and lung volumes. No focal airspace opacity. No metallic foreign body. Visualized upper abdomen is unremarkable. IMPRESSION: 1. No metallic foreign body. 2. There is a least mild interval increase in size of the cardiac contours compared to 04/20/21 even when accounting for differences in technique and lung volumes. Consider further evaluation with echocardiography. Electronically Signed   By: Lorenza Cambridge M.D.   On: 10/04/2022 11:59   CT ANGIO HEAD NECK W WO CM W PERF (CODE STROKE)  Result Date: 10/04/2022 CLINICAL DATA:  46 year old male code stroke presentation suspicious for Left hemisphere ELVO. EXAM: CT ANGIOGRAPHY HEAD AND NECK CT PERFUSION BRAIN TECHNIQUE: Multidetector CT imaging of the head and neck was performed using the standard protocol during bolus administration of intravenous contrast. Multiplanar CT image reconstructions and MIPs were obtained to evaluate the vascular anatomy. Carotid stenosis measurements (when applicable) are obtained utilizing NASCET criteria, using the distal internal carotid diameter as the denominator. Multiphase CT imaging of the brain was performed following IV bolus contrast injection. Subsequent parametric perfusion maps were calculated using RAPID software. RADIATION DOSE REDUCTION: This exam was performed according to the departmental dose-optimization program which includes  automated exposure control, adjustment of the mA and/or kV according to patient size and/or use of iterative reconstruction technique. CONTRAST:  100 mL Omnipaque 350 COMPARISON:  Plain head CT 0430 hours. FINDINGS: CT Brain Perfusion Findings: ASPECTS: 10 CBF (<30%) Volume: 3mL Perfusion (Tmax>6.0s) volume: 69mL, however, some of this is bilateral and even registers in the brainstem. There is 12 mm of T-max > 10 S in the posterior left MCA territory. Hypoperfusion index 0.2. Mismatch Volume: Unclear. Infarction Location:Posterior left MCA territory abnormal CBF, abnormal CBV, and T-max > 10 S in an area of up to 12 mL. CTA NECK Skeleton: Scattered carious dentition. Age advanced lower cervical spine degeneration with bulky endplate osteophytosis. No acute osseous abnormality identified. Upper chest: Mild upper lung atelectasis. There is a left coronary artery stent in place. Small volume of dependent retained secretions in the trachea approaching the carina. But otherwise the visible major airways are patent. Other neck: No acute finding. Aortic arch: 3 vessel arch configuration.  No arch atherosclerosis. Right carotid system: Negative. Left carotid system: Negative. Vertebral arteries: Suboptimal but adequate cervical vertebral artery contrast timing. Proximal right subclavian and cervical right vertebral artery appear patent and negative. Proximal left subclavian and codominant cervical left vertebral artery appear patent and negative. CTA HEAD Posterior circulation: Fairly codominant distal vertebral arteries appear patent and within  normal limits to the basilar. Right PICA origin appears patent. Left AICA might be dominant, uncertain. Patent basilar artery without stenosis. SCA and PCA origins appear normal. Left posterior communicating artery is present, right is diminutive or absent. Bilateral PCA branches are within normal limits. Anterior circulation: Both ICA siphons are patent, with no siphon plaque or  stenosis. Normal left posterior communicating artery. Patent carotid termini, MCA and ACA origins. Codominant A1 segments. Diminutive anterior communicating arteries suspected. Suboptimal intracranial contrast bolus. Allowing for this the ACA branches are within normal limits. Likewise, the right M1 segment, bifurcation, and right MCA branches are within normal limits. And the left MCA M1 segment also appears symmetric, patent to the trifurcation, and within normal limits. No left MCA branch occlusion is identified. Venous sinuses: Early contrast timing, grossly patent. Anatomic variants: None significant. Review of the MIP images confirms the above findings Preliminary report of the above discussed by telephone Dr. Loleta Rose on 10/04/2022 at 0518 hours. IMPRESSION: 1. Suboptimal although adequate contrast bolus. Strong evidence on CTP of a small posterior Left MCA territory infarct core (approximately 12 mL). CTP also suggests approximately 60 mL of oligemia in the Left MCA territory. However, there is NO large vessel occlusion, and no discrete MCA branch occlusion detected on CTA. 2. No atherosclerosis, stenosis, or arterial abnormality identified in the head or neck. There is a left coronary artery stent visible in the upper chest. 3. Mild upper lung atelectasis and small volume retained secretions in the trachea. Salient findings discussed by telephone with Dr. Loleta Rose on 10/04/2022 at 0518 hours. Electronically Signed   By: Odessa Fleming M.D.   On: 10/04/2022 05:31   CT HEAD CODE STROKE WO CONTRAST  Addendum Date: 10/04/2022   ADDENDUM REPORT: 10/04/2022 04:51 ADDENDUM: Study discussed by telephone with Dr. Loleta Rose on 10/04/2022 at 0445 hours. Clinical picture is highly suspicious for Left MCA ELVO. Patient was undergoing initial tele neurology evaluation as we spoke. Electronically Signed   By: Odessa Fleming M.D.   On: 10/04/2022 04:51   Result Date: 10/04/2022 CLINICAL DATA:  Code stroke. 46 year old male  with right side weakness and aphasia. EXAM: CT HEAD WITHOUT CONTRAST TECHNIQUE: Contiguous axial images were obtained from the base of the skull through the vertex without intravenous contrast. RADIATION DOSE REDUCTION: This exam was performed according to the departmental dose-optimization program which includes automated exposure control, adjustment of the mA and/or kV according to patient size and/or use of iterative reconstruction technique. COMPARISON:  Head CT 05/20/2009. FINDINGS: Brain: No acute intracranial hemorrhage identified. No midline shift, mass effect, or evidence of intracranial mass lesion. No ventriculomegaly. Normal cerebral volume. No convincing cytotoxic edema. Mild asymmetric enlargement of the left lateral ventricle redemonstrated and normal variant. Vascular: Subtle if any asymmetric hyperdensity of left MCA branches in the sylvian fissure. Skull: No acute osseous abnormality identified. Sinuses/Orbits: Visualized paranasal sinuses and mastoids are stable and well aerated. Other: Leftward gaze. Visualized scalp soft tissues are within normal limits. ASPECTS [facility] Stroke Program Early CT Score) Total score (0-10 with 10 being normal): 10 IMPRESSION: Leftward gaze, but no acute cortically based infarct or intracranial hemorrhage identified. And subtle if any left MCA vessel asymmetry. ASPECTS 10. Electronically Signed: By: Odessa Fleming M.D. On: 10/04/2022 04:40    TELEMETRY reviewed by me (LT) 10/08/2022 : NSR rate 80s without evidence of atrial fibrillation  EKG reviewed by me: Sinus rhythm with lateral Q waves evidence of an old anterior infarct without acute ischemic changes.  Data reviewed by me (LT) 10/08/2022: cross cover notes, hospitalist progress note chest x-ray, vitals telemetry CBC BMP  Principal Problem:   Acute CVA (cerebrovascular accident) [facility]) Active Problems:   Essential hypertension   CAD (coronary artery disease)   Polysubstance abuse (HCC)   Expressive  aphasia   Right hemiplegia (HCC)   CAD S/P percutaneous coronary angioplasty   Primary hypertension   Cocaine use   Encounter for imaging to screen for metal prior to MRI   HFrEF (heart failure with reduced ejection fraction) (HCC)   Cerebrovascular accident (CVA) (HCC)    ASSESSMENT AND PLAN:  Ricardo Yang is a 59yoM with a PMH of anterior STEMI s/p PCI with DES prox LAD (07/2019), medically managed NSTEMI w/ LHC patent prox LAD stent, 100% sm-mod caliber OM2, 50% mid RCA, ost - prox LAD 20% (03/2021), HFrEF (<20%, global hypo 09/2022, prev 25-30% 03/2021), obesity, polysubstance abuse (cocaine, tobacco), hx CVA who presented to [facility] ED 10/04/22 with right sided hemiplegia and expressive aphasia. Code Stroke activated in the ED, no LVO on CT. He did not receive thrombolytics. 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 Cardiology is consulted on hospital day 3 for assistance with his HF and to arrange TEE.   # left MCA territory infarct  # cocaine abuse Seen and evaluated by neurology, with etiology of CVA felt to be related to cocaine/vasospasm.  Neurology recommends TEE.  Will plan for this with Dr. Azucena Cecil today at 12/13 at ~12pm.  -If TEE is negative, will arrange for cardiac monitoring at discharge for evaluation of arrhythmia/atrial fibrillation   # HFrEF (<20%)  Echo this admission shows a reduction in his EF compared to June 2022.  Over the past several months he reports having increased dyspnea on exertion and what sounds like apneic episodes described by his fiance (?OSA) but does not appear grossly volume overloaded on exam, comfortable on room air.  Chest x-ray without vascular congestion or pleural effusions. Unfortunately, due to financial constraints he has not been on any GDMT, nor has he had outpatient follow-up with cardiology or a PCP.   -Will discuss with pharmacy re: assistance with medications -continue low-dose  metoprolol XL 12.5 mg daily -start farxiga 10mg  once daily  -plan to escalate GDMT with ARB, MRA as BP allows (running 110s/80s prior to adding BB) -No invasive cardiac diagnostics are planned at this time -Stressed the importance of compliance with medications, low salt diet, and outpatient follow-up  -will arrange for follow up with Dr. Darrold Junker 1-2 weeks after discharge. HF clinic follow up could also be of benefit.   # demand ischemia  # CAD w/ hx anterior STEMI 07/2019, NSTEMI 03/2021 Following dinner (hot dog) on 12/12 he reported "right sided chest pain" that he actually localized to his abdominal RUQ, dissimilar to his prior angina, describes more like "Gas pain." EKG non acute. Troponins elevated but flat (265, 275, 261). This represents demand ischemia, questionably from his CVA, and not ACS.  -Recommend aspirin 81 mg indefinitely and continue plavix for now  -continue atorvastatin 80mg  daily (LDL 102)  # Cocaine abuse # Tobacco abuse  He is motivated to quit smoking tobacco, but insists to me he has "cut back on cocaine" compared to the past. Strongly encouraged complete cessation from tobacco and cocaine and the cardiac & neurologic harm of continued use.   This patient's plan of care was discussed and created with Dr. Juliann Pares and he is in  agreement.  Signed: Rebeca Allegra , PA-C 10/08/2022, 7:53 AM [facility]

## 2022-10-08 NOTE — Progress Notes (Signed)
Critical lab value: Troponin 265. On call provider aware.

## 2022-10-08 NOTE — Progress Notes (Signed)
Pt c/o of sharp pain on Right side of chest 7/10. Pt stated that it was worse when he took a deep breath. Pt vital signs stable. EKG in chart. Provider made aware with new orders given.

## 2022-10-08 NOTE — Progress Notes (Signed)
  Echocardiogram Echocardiogram Transesophageal has been performed.  Ricardo Yang M 10/08/2022, 12:31 PM

## 2022-10-08 NOTE — Progress Notes (Signed)
SLP Cancellation Note  Patient Details Name: Ricardo Yang MRN: [id number] DOB: 1975/11/01   Cancelled treatment:       Reason Eval/Treat Not Completed: Patient at procedure or test/unavailable (chart reviewed; consulted NSG) Pt is currently NPO for TEE procedure today. Cautioned NSG re: pt swallowing any po's immediately post returning from procedure d/t lethargy/numbing. NSG agreed and will monitor pt's status.  ST services will f/u tomorrow w/ pt's toleration of oral diet and speech therapy needs. NSG agreed.     Jerilynn Som, MS, CCC-SLP Speech Language Pathologist Rehab Services; [facility] [phone] (ascom) Malaijah Houchen 10/08/2022, 5:12 PM

## 2022-10-08 NOTE — Progress Notes (Signed)
[facility] IP rehab admissions - Noted MD says patient has now completed all procedures and tests.  Hopeful for bed available in am for this patient.  I will follow up in am for plans.  Call me for questions.  [phone]

## 2022-10-09 ENCOUNTER — Encounter: Payer: Self-pay | Admitting: Cardiology

## 2022-10-09 ENCOUNTER — Other Ambulatory Visit: Payer: Self-pay

## 2022-10-09 ENCOUNTER — Inpatient Hospital Stay (HOSPITAL_COMMUNITY)
Admission: RE | Admit: 2022-10-09 | Discharge: 2022-10-23 | DRG: 057 | Disposition: A | Discharge status: Home Health | Payer: Medicaid Other | Source: Other Acute Inpatient Hospital | Attending: Physical Medicine & Rehabilitation | Admitting: Physical Medicine & Rehabilitation

## 2022-10-09 DIAGNOSIS — I63512 Cerebral infarction due to unspecified occlusion or stenosis of left middle cerebral artery: Secondary | ICD-10-CM | POA: Diagnosis not present

## 2022-10-09 DIAGNOSIS — I69351 Hemiplegia and hemiparesis following cerebral infarction affecting right dominant side: Principal | ICD-10-CM

## 2022-10-09 DIAGNOSIS — F1990 Other psychoactive substance use, unspecified, uncomplicated: Secondary | ICD-10-CM

## 2022-10-09 DIAGNOSIS — M25562 Pain in left knee: Secondary | ICD-10-CM | POA: Diagnosis present

## 2022-10-09 DIAGNOSIS — Z79899 Other long term (current) drug therapy: Secondary | ICD-10-CM | POA: Diagnosis not present

## 2022-10-09 DIAGNOSIS — E785 Hyperlipidemia, unspecified: Secondary | ICD-10-CM | POA: Diagnosis present

## 2022-10-09 DIAGNOSIS — K5900 Constipation, unspecified: Secondary | ICD-10-CM | POA: Diagnosis not present

## 2022-10-09 DIAGNOSIS — R2689 Other abnormalities of gait and mobility: Secondary | ICD-10-CM | POA: Diagnosis present

## 2022-10-09 DIAGNOSIS — I10 Essential (primary) hypertension: Secondary | ICD-10-CM

## 2022-10-09 DIAGNOSIS — I639 Cerebral infarction, unspecified: Secondary | ICD-10-CM | POA: Diagnosis not present

## 2022-10-09 DIAGNOSIS — F17210 Nicotine dependence, cigarettes, uncomplicated: Secondary | ICD-10-CM | POA: Diagnosis present

## 2022-10-09 DIAGNOSIS — G8929 Other chronic pain: Secondary | ICD-10-CM | POA: Diagnosis present

## 2022-10-09 DIAGNOSIS — F1910 Other psychoactive substance abuse, uncomplicated: Secondary | ICD-10-CM | POA: Diagnosis not present

## 2022-10-09 DIAGNOSIS — D72829 Elevated white blood cell count, unspecified: Secondary | ICD-10-CM | POA: Diagnosis present

## 2022-10-09 DIAGNOSIS — I252 Old myocardial infarction: Secondary | ICD-10-CM

## 2022-10-09 DIAGNOSIS — Z7982 Long term (current) use of aspirin: Secondary | ICD-10-CM | POA: Diagnosis not present

## 2022-10-09 DIAGNOSIS — K5901 Slow transit constipation: Secondary | ICD-10-CM | POA: Diagnosis present

## 2022-10-09 DIAGNOSIS — I255 Ischemic cardiomyopathy: Secondary | ICD-10-CM | POA: Diagnosis present

## 2022-10-09 DIAGNOSIS — Z8249 Family history of ischemic heart disease and other diseases of the circulatory system: Secondary | ICD-10-CM | POA: Diagnosis not present

## 2022-10-09 DIAGNOSIS — I5022 Chronic systolic (congestive) heart failure: Secondary | ICD-10-CM | POA: Diagnosis present

## 2022-10-09 DIAGNOSIS — E669 Obesity, unspecified: Secondary | ICD-10-CM | POA: Diagnosis present

## 2022-10-09 DIAGNOSIS — I110 Hypertensive heart disease with heart failure: Secondary | ICD-10-CM | POA: Diagnosis present

## 2022-10-09 DIAGNOSIS — Z9861 Coronary angioplasty status: Secondary | ICD-10-CM

## 2022-10-09 DIAGNOSIS — G4700 Insomnia, unspecified: Secondary | ICD-10-CM | POA: Diagnosis present

## 2022-10-09 DIAGNOSIS — G35 Multiple sclerosis: Secondary | ICD-10-CM | POA: Diagnosis present

## 2022-10-09 DIAGNOSIS — I69398 Other sequelae of cerebral infarction: Secondary | ICD-10-CM

## 2022-10-09 DIAGNOSIS — M1711 Unilateral primary osteoarthritis, right knee: Secondary | ICD-10-CM

## 2022-10-09 DIAGNOSIS — I2510 Atherosclerotic heart disease of native coronary artery without angina pectoris: Secondary | ICD-10-CM | POA: Diagnosis present

## 2022-10-09 DIAGNOSIS — Z6830 Body mass index (BMI) 30.0-30.9, adult: Secondary | ICD-10-CM | POA: Diagnosis not present

## 2022-10-09 DIAGNOSIS — M25561 Pain in right knee: Secondary | ICD-10-CM | POA: Diagnosis present

## 2022-10-09 DIAGNOSIS — Z7902 Long term (current) use of antithrombotics/antiplatelets: Secondary | ICD-10-CM

## 2022-10-09 DIAGNOSIS — I69320 Aphasia following cerebral infarction: Secondary | ICD-10-CM | POA: Diagnosis not present

## 2022-10-09 DIAGNOSIS — I25810 Atherosclerosis of coronary artery bypass graft(s) without angina pectoris: Secondary | ICD-10-CM | POA: Diagnosis not present

## 2022-10-09 DIAGNOSIS — F1911 Other psychoactive substance abuse, in remission: Secondary | ICD-10-CM

## 2022-10-09 DIAGNOSIS — I5020 Unspecified systolic (congestive) heart failure: Secondary | ICD-10-CM | POA: Diagnosis not present

## 2022-10-09 HISTORY — DX: Cerebral infarction due to unspecified occlusion or stenosis of left middle cerebral artery: I63.512

## 2022-10-09 LAB — CULTURE, BLOOD (ROUTINE X 2)
Culture: NO GROWTH
Culture: NO GROWTH
Special Requests: ADEQUATE

## 2022-10-09 MED ORDER — ACETAMINOPHEN 650 MG RE SUPP: 650.0000 mg | RECTAL | Status: DC | PRN

## 2022-10-09 MED ORDER — DAPAGLIFLOZIN PROPANEDIOL 10 MG PO TABS: 10.0000 mg | ORAL_TABLET | Freq: Every day | ORAL | Status: DC

## 2022-10-09 MED ORDER — CLOPIDOGREL BISULFATE 75 MG PO TABS
75.0000 mg | ORAL_TABLET | Freq: Every day | ORAL | Status: DC
  Administered 2022-10-10 – 2022-10-23 (×14): 75 mg via ORAL
  Filled 2022-10-09 (×15): qty 1

## 2022-10-09 MED ORDER — METOPROLOL SUCCINATE ER 25 MG PO TB24: 12.5000 mg | ORAL_TABLET | Freq: Every day | ORAL | Status: DC

## 2022-10-09 MED ORDER — POLYETHYLENE GLYCOL 3350 17 G PO PACK: 17.0000 g | PACK | Freq: Every day | ORAL | Status: DC

## 2022-10-09 MED ORDER — DAPAGLIFLOZIN PROPANEDIOL 10 MG PO TABS
10.0000 mg | ORAL_TABLET | Freq: Every day | ORAL | Status: DC
  Administered 2022-10-10 – 2022-10-23 (×14): 10 mg via ORAL
  Filled 2022-10-09 (×14): qty 1

## 2022-10-09 MED ORDER — METOPROLOL SUCCINATE ER 25 MG PO TB24
12.5000 mg | ORAL_TABLET | Freq: Every day | ORAL | Status: DC
  Administered 2022-10-10 – 2022-10-23 (×14): 12.5 mg via ORAL
  Filled 2022-10-09 (×14): qty 1

## 2022-10-09 MED ORDER — ATORVASTATIN CALCIUM 80 MG PO TABS
80.0000 mg | ORAL_TABLET | Freq: Every day | ORAL | Status: DC
  Administered 2022-10-09 – 2022-10-22 (×14): 80 mg via ORAL
  Filled 2022-10-09 (×14): qty 1

## 2022-10-09 MED ORDER — LISINOPRIL 5 MG PO TABS
2.5000 mg | ORAL_TABLET | Freq: Every day | ORAL | Status: DC
  Administered 2022-10-10 – 2022-10-23 (×14): 2.5 mg via ORAL
  Filled 2022-10-09 (×15): qty 1

## 2022-10-09 MED ORDER — ACETAMINOPHEN 160 MG/5ML PO SOLN: 650.0000 mg | ORAL | Status: DC | PRN

## 2022-10-09 MED ORDER — POLYETHYLENE GLYCOL 3350 17 G PO PACK
17.0000 g | PACK | Freq: Every day | ORAL | Status: DC
  Administered 2022-10-10 – 2022-10-19 (×8): 17 g via ORAL
  Filled 2022-10-09 (×14): qty 1

## 2022-10-09 MED ORDER — ACETAMINOPHEN 325 MG PO TABS: 650.0000 mg | ORAL_TABLET | ORAL | Status: DC | PRN

## 2022-10-09 MED ORDER — LACTULOSE 10 GM/15ML PO SOLN: 20.0000 g | Freq: Once | ORAL | Status: DC

## 2022-10-09 MED ORDER — ASPIRIN 81 MG PO CHEW
81.0000 mg | CHEWABLE_TABLET | Freq: Every day | ORAL | Status: DC
  Administered 2022-10-10 – 2022-10-23 (×14): 81 mg via ORAL
  Filled 2022-10-09 (×14): qty 1

## 2022-10-09 MED ORDER — POLYETHYLENE GLYCOL 3350 17 G PO PACK: 17.0000 g | PACK | Freq: Every day | ORAL | 0 refills | Status: DC

## 2022-10-09 NOTE — Progress Notes (Signed)
Occupational Therapy Treatment Patient Details Name: Ricardo Yang MRN: [id number] DOB: 04-28-1976 Today's Date: 10/09/2022   History of present illness 46 year old male with left MCA territory infarct. PMH significant for prior CVA with no deficits, coronary artery disease with complications of ischemic cardiomyopathy   OT comments  [name] was seen for OT treatment on this date. Upon arrival to room pt reclined in bed, agreeable to tx. Pt requires MIN A + HW sit<>stand and functional mobility ~15 ft. SETUP + CGA grooming standing sink side, cues for WBing through RUE on counter. MAX A self-drinking using RUE seated in chair - improved use of functional grip.  Standing therex for RUE: 1 set x 15 reps each: shoulder shrugs, elbow flexion/extension, counter push ups emphasis on R tricep use, shoulder flexion to shoulder height with min support at elbow, emphasis on full elbow extension, digit flexion to make full fist, and scapular retractions. Markedly improved active use of RUE this session. Pt making excellent progress toward goals, will continue to follow POC. Discharge recommendation remains appropriate.     Recommendations for follow up therapy are one component of a multi-disciplinary discharge planning process, led by the attending physician.  Recommendations may be updated based on patient status, additional functional criteria and insurance authorization.    Follow Up Recommendations  Acute inpatient rehab (3hours/day)     Assistance Recommended at Discharge Frequent or constant Supervision/Assistance  Patient can return home with the following  Two people to help with walking and/or transfers;Two people to help with bathing/dressing/bathroom   Equipment Recommendations  Other (comment) (defer)    Recommendations for Other Services Rehab consult    Precautions / Restrictions Precautions Precautions: Shoulder;Fall Restrictions Weight Bearing Restrictions: No        Mobility Bed Mobility Overal bed mobility: Needs Assistance Bed Mobility: Supine to Sit     Supine to sit: Min guard          Transfers Overall transfer level: Needs assistance Equipment used: Hemi-walker Transfers: Sit to/from Stand Sit to Stand: Min assist           General transfer comment: assist for RUE support     Balance Overall balance assessment: Needs assistance Sitting-balance support: No upper extremity supported, Feet supported Sitting balance-Leahy Scale: Good     Standing balance support: No upper extremity supported, During functional activity Standing balance-Leahy Scale: Fair                             ADL either performed or assessed with clinical judgement   ADL Overall ADL's : Needs assistance/impaired                                       General ADL Comments: SETUP + CGA grooming standing sink side, cues for WBing through RUE on counter. MAX A self-drinking using RUE seated in chair    Extremity/Trunk Assessment Upper Extremity Assessment Upper Extremity Assessment: RUE deficits/detail RUE Deficits / Details: 3-/5 grossly             Cognition Arousal/Alertness: Awake/alert Behavior During Therapy: WFL for tasks assessed/performed Overall Cognitive Status: Within Functional Limits for tasks assessed  Exercises Exercises: General Upper Extremity Other Exercises: tapping facilitated exercises for R elbow flexion and supination/pronation Other Exercises: Standing therex for RUE: 1 set x 15 reps each: shoulder shrugs, elbow flexion/extension, counter push ups emphasis on R tricep use, shoulder flexion to shoulder height with min support at elbow, emphasis on full elbow extension, digit flexion to make full fist, and scapular retractions.            Pertinent Vitals/ Pain       Pain Assessment Pain Assessment: No/denies pain   Frequency   Min 5X/week        Progress Toward Goals  OT Goals(current goals can now be found in the care plan section)  Progress towards OT goals: Progressing toward goals  Acute Rehab OT Goals Patient Stated Goal: to go home OT Goal Formulation: With patient/family Time For Goal Achievement: 10/18/22 Potential to Achieve Goals: Good ADL Goals Pt Will Perform Grooming: sitting;with set-up Pt Will Perform Lower Body Dressing: with min assist;sitting/lateral leans Pt Will Transfer to Toilet: bedside commode;stand pivot transfer;with mod assist Pt Will Perform Toileting - Clothing Manipulation and hygiene: with min assist;sitting/lateral leans Pt/caregiver will Perform Home Exercise Program: Increased ROM;Increased strength;Right Upper extremity;With written HEP provided  Plan Discharge plan remains appropriate;Frequency remains appropriate    Co-evaluation                 AM-PAC OT "6 Clicks" Daily Activity     Outcome Measure   Help from another person eating meals?: A Little Help from another person taking care of personal grooming?: A Little Help from another person toileting, which includes using toliet, bedpan, or urinal?: A Lot Help from another person bathing (including washing, rinsing, drying)?: A Lot Help from another person to put on and taking off regular upper body clothing?: A Lot Help from another person to put on and taking off regular lower body clothing?: A Lot 6 Click Score: 14    End of Session Equipment Utilized During Treatment: Gait belt  OT Visit Diagnosis: Other abnormalities of gait and mobility (R26.89);Muscle weakness (generalized) (M62.81);Hemiplegia and hemiparesis Hemiplegia - Right/Left: Right Hemiplegia - dominant/non-dominant: Dominant Hemiplegia - caused by: Cerebral infarction Pain - Right/Left: Right   Activity Tolerance Patient tolerated treatment well   Patient Left in chair;with call bell/phone within reach;with chair alarm set;with  family/visitor present;with nursing/sitter in room   Nurse Communication          Time: 9211-9417 OT Time Calculation (min): 41 min  Charges: OT General Charges $OT Visit: 1 Visit OT Treatments $Self Care/Home Management : 8-22 mins $Neuromuscular Re-education: 8-22 mins $Therapeutic Exercise: 8-22 mins  Kathie Dike, M.S. OTR/L  10/09/22, 10:01 AM  ascom [phone]

## 2022-10-09 NOTE — Progress Notes (Signed)
IP rehab admissions - I spoke with the patient and he is agreeable to inpatient rehab admission at [facility].  I have called [facility] and scheduled pickup, but they did not verify a pick up time.  Patient will admit to 4W06 at [facility].  Please have nurse call report to [phone].  I can be reached at [phone]

## 2022-10-09 NOTE — H&P (Incomplete)
Physical Medicine and Rehabilitation Admission H&P    Chief Complaint  Patient presents with   Code Stroke    LKN 10pm yesterday. At 350 am Girlfriend states heard a thud to floor, found patient in the floor. Non verbal, and unable to move R side with global aphasia. Last CVA 2 years ago with no deficit  : HPI: Ricardo Yang is a 46 year old right-handed male with history of reported CVA in the past with no deficits, CAD with anterior STEMI 07/2019, non-STEMI 03/2021/status post PCI with complications of ischemic cardiomyopathy ejection fraction 25 to 30% from an echocardiogram 6/23 maintained on aspirin and Plavix last seen by cardiology services Dr.Paraschos 2022, hypertension maintained on lisinopril 2.5 mg daily, Toprol XL 25 mg daily, tobacco use as well as polysubstance abuse, obesity with BMI 30.70.  Per chart review patient lives with significant other.  1 level home with ramped entrance.  Reportedly independent prior to admission.  Presented 10/04/2022 with acute onset of aphasia and right-sided weakness and leftward gaze.  Cranial CT scan with no acute changes.  CT angiogram head and neck no large vessel occlusion.  No atherosclerosis, stenosis or arterial abnormality identified in the head or neck.  MRI showed acute infarcts in the left ACA, left MCA and likely of the right PCA territory with a large infarct involving the left postcentral gyrus.  Patient did not receive tPA.  Admission chemistries unremarkable except WBC 11,300, alcohol negative, urine drug screen positive for cocaine as well as marijuana, troponin 265-275.  Echocardiogram with ejection fraction less than 20%.  The left ventricle showed severely decreased function as well as global hypokinesis.  TEE completed by cardiology services showing severely reduced systolic function and no mural apical thrombus with estimated ejection fraction 20%.  Bubble study was negative.  No PFO noted.  Elevated troponin felt to be related to  demand ischemia.  Patient remains on Plavix and aspirin as prior to admission.  Permissive hypertension with low-dose lisinopril as well as Toprol resumed.  Tolerating a regular consistency diet.  Therapy evaluations completed due to patient's aphasia decreased functional mobility and right side weakness was admitted for a comprehensive rehab program.  Review of Systems  Constitutional:  Negative for chills and fever.  HENT:  Negative for hearing loss.   Eyes:  Negative for blurred vision and double vision.  Respiratory:  Negative for cough, shortness of breath and wheezing.   Cardiovascular:  Positive for leg swelling. Negative for chest pain and palpitations.  Gastrointestinal:  Positive for constipation. Negative for heartburn, nausea and vomiting.  Genitourinary:  Negative for dysuria, flank pain and hematuria.  Musculoskeletal:  Positive for myalgias.  Skin:  Negative for rash.  Neurological:  Positive for speech change and weakness.  All other systems reviewed and are negative.  Past Medical History:  Diagnosis Date   Multiple sclerosis (HCC)    a. question possible MS   Obesity    Polysubstance abuse (HCC)    a. tobacco, cocaine, crack, ecstasy, pills, "anything but injections"   Past Surgical History:  Procedure Laterality Date   CARDIAC CATHETERIZATION N/A 05/28/2015   Procedure: Left Heart Cath and Coronary Angiography;  Surgeon: Antonieta Iba, MD;  Location: [facility];  Service: Cardiovascular;  Laterality: N/A;   CORONARY ANGIOGRAPHY N/A 04/22/2021   Procedure: CORONARY ANGIOGRAPHY;  Surgeon: Marcina Millard, MD;  Location: [facility];  Service: Cardiovascular;  Laterality: N/A;   CORONARY/GRAFT ACUTE MI REVASCULARIZATION N/A 08/06/2019  Procedure: Coronary/Graft Acute MI Revascularization;  Surgeon: Marcina Millard, MD;  Location: [facility];  Service: Cardiovascular;  Laterality: N/A;   LEFT HEART CATH N/A 04/22/2021   Procedure:  Left Heart Cath;  Surgeon: Marcina Millard, MD;  Location: [facility];  Service: Cardiovascular;  Laterality: N/A;   LEFT HEART CATH AND CORONARY ANGIOGRAPHY N/A 08/06/2019   Procedure: LEFT HEART CATH AND CORONARY ANGIOGRAPHY;  Surgeon: Marcina Millard, MD;  Location: [facility];  Service: Cardiovascular;  Laterality: N/A;   Family History  Problem Relation Age of Onset   Heart disease Father    Social History:  reports that he has been smoking. He has never used smokeless tobacco. He reports current drug use. Frequency: 1.00 time per week. Drugs: "Crack" cocaine, Amphetamines, Cocaine, Hashish, Marijuana, and MDMA (Ecstacy). He reports that he does not drink alcohol. Allergies: No Known Allergies Medications Prior to Admission  Medication Sig Dispense Refill   aspirin 81 MG chewable tablet Chew 1 tablet (81 mg total) by mouth daily. (Patient not taking: Reported on 10/04/2022) 30 tablet 0   atorvastatin (LIPITOR) 80 MG tablet Take 1 tablet (80 mg total) by mouth daily at 6 PM. (Patient not taking: Reported on 10/04/2022) 30 tablet 0   clopidogrel (PLAVIX) 75 MG tablet Take 1 tablet (75 mg total) by mouth daily with breakfast. (Patient not taking: Reported on 10/04/2022) 30 tablet 0   lisinopril (ZESTRIL) 5 MG tablet Take (1/2) tablet (2.5 mg total) by mouth daily. (Patient not taking: Reported on 10/04/2022) 15 tablet 0   metoprolol succinate (TOPROL-XL) 25 MG 24 hr tablet Take 1 tablet (25 mg total) by mouth daily. Take with or immediately following a meal. (Patient not taking: Reported on 10/04/2022) 30 tablet 0   naproxen (NAPROSYN) 500 MG tablet Take 1 tablet (500 mg total) by mouth 2 (two) times daily with a meal. (Patient not taking: Reported on 10/04/2022) 30 tablet 0   traMADol (ULTRAM) 50 MG tablet Take 1 tablet (50 mg total) by mouth every 6 (six) hours as needed. (Patient not taking: Reported on 10/04/2022) 12 tablet 0      Home: Home Living Family/patient  expects to be discharged to:: Private residence Living Arrangements: Spouse/significant other Available Help at Discharge: Family, Available 24 hours/day Type of Home: House Home Access: Ramped entrance Home Layout: One level Bathroom Shower/Tub: Associate Professor: No  Lives With: Significant other   Functional History: Prior Function Prior Level of Function : Independent/Modified Independent, History of Falls (last six months) Mobility Comments: independent no AD ADLs Comments: independent  Functional Status:  Mobility: Bed Mobility Overal bed mobility: Needs Assistance Bed Mobility: Supine to Sit Rolling: Min assist Supine to sit: Min assist Sit to supine: Mod assist General bed mobility comments: deferred 2/2 fatigue from recent procedure Transfers Overall transfer level: Needs assistance Equipment used: None Transfers: Sit to/from Stand Sit to Stand: Min assist Bed to/from chair/wheelchair/BSC transfer type:: Squat pivot, Step pivot Stand pivot transfers: Mod assist, +2 physical assistance, Min assist Squat pivot transfers: Mod assist Step pivot transfers: Mod assist, From elevated surface General transfer comment: assist for RUE support only during multiple (~8) sit<>stand trials from bed and chair height Ambulation/Gait Ambulation/Gait assistance: Mod assist, +2 physical assistance, +2 safety/equipment Gait Distance (Feet):  (25 ft + 22 ft + 20 ft) Assistive device:  (L rail in hallway, transitioning to LUE HHA when rail runs out) Gait Pattern/deviations: Step-to pattern General Gait Details: deferred  due to emphasis of session Gait velocity: decreased    ADL: ADL Overall ADL's : Needs assistance/impaired Eating/Feeding: Set up, Bed level Eating/Feeding Details (indicate cue type and reason): set up A using non-dominant hand for self-feeding Lower Body Dressing: Maximal assistance, Bed level Lower Body Dressing  Details (indicate cue type and reason): socks Toileting- Clothing Manipulation and Hygiene: Maximal assistance, Bed level General ADL Comments: MOD A simulated grooming bed level  Cognition: Cognition Overall Cognitive Status: Within Functional Limits for tasks assessed Arousal/Alertness: Awake/alert Orientation Level: Oriented X4 Cognition Arousal/Alertness: Lethargic Behavior During Therapy: WFL for tasks assessed/performed Overall Cognitive Status: Within Functional Limits for tasks assessed General Comments: eyes closing wihtout stimulation at end of session - very recently returned from TEE procedure  Physical Exam: Blood pressure (!) 126/92, pulse 76, temperature 98 F (36.7 C), temperature source Oral, resp. rate 16, height 6\' 3"  (1.905 m), weight 111.4 kg, SpO2 98 %. Physical Exam Neurological:     Comments: Patient is alert.  Mild leftward gaze.  Follows simple commands.  Patient is aphasic having difficulty repeating complex sentences but when taking his time could answer questions appropriately with short responses.     Results for orders placed or performed during the hospital encounter of 10/04/22 (from the past 48 hour(s))  Troponin I (High Sensitivity)     Status: Abnormal   Collection Time: 10/07/22 10:38 PM  Result Value Ref Range   Troponin I (High Sensitivity) 265 (HH) <18 ng/L    Comment: CRITICAL RESULT CALLED TO, READ BACK BY AND VERIFIED WITH BRITTANY ROGERS@[id number]  10/07/22 RH (NOTE) Elevated high sensitivity troponin I (hsTnI) values and significant  changes across serial measurements may suggest ACS but many other  chronic and acute conditions are known to elevate hsTnI results.  Refer to the "Links" section for chest pain algorithms and additional  guidance. Performed at [facility], [address] [place], [place]   Troponin I (High Sensitivity)     Status: Abnormal   Collection Time: 10/08/22 12:24 AM  Result Value Ref Range    Troponin I (High Sensitivity) 275 (HH) <18 ng/L    Comment: CRITICAL VALUE NOTED. VALUE IS CONSISTENT WITH PREVIOUSLY REPORTED/CALLED VALUE RH (NOTE) Elevated high sensitivity troponin I (hsTnI) values and significant  changes across serial measurements may suggest ACS but many other  chronic and acute conditions are known to elevate hsTnI results.  Refer to the "Links" section for chest pain algorithms and additional  guidance. Performed at [facility], [address] [place], [place]   Basic metabolic panel     Status: Abnormal   Collection Time: 10/08/22  4:08 AM  Result Value Ref Range   Sodium 138 135 - 145 mmol/L   Potassium 3.8 3.5 - 5.1 mmol/L   Chloride 107 98 - 111 mmol/L   CO2 25 22 - 32 mmol/L   Glucose, Bld 110 (H) 70 - 99 mg/dL    Comment: Glucose reference range applies only to samples taken after fasting for at least 8 hours.   BUN 15 6 - 20 mg/dL   Creatinine, Ser 10/10/22 0.61 - 1.24 mg/dL   Calcium 8.9 8.9 - 4.82 mg/dL   GFR, Estimated 50.0 >37 mL/min    Comment: (NOTE) Calculated using the CKD-EPI Creatinine Equation (2021)    Anion gap 6 5 - 15    Comment: Performed at [facility], [address], [place], [place]  CBC     Status: Abnormal  Collection Time: 10/08/22  4:08 AM  Result Value Ref Range   WBC 11.9 (H) 4.0 - 10.5 K/uL   RBC 5.58 4.22 - 5.81 MIL/uL   Hemoglobin 15.5 13.0 - 17.0 g/dL   HCT 70.1 77.9 - 39.0 %   MCV 85.5 80.0 - 100.0 fL   MCH 27.8 26.0 - 34.0 pg   MCHC 32.5 30.0 - 36.0 g/dL   RDW 30.0 92.3 - 30.0 %   Platelets 252 150 - 400 K/uL   nRBC 0.0 0.0 - 0.2 %    Comment: Performed at [facility], [address] [place], [place] [address]  Troponin I (High Sensitivity)     Status: Abnormal   Collection Time: 10/08/22  4:08 AM  Result Value Ref Range   Troponin I (High Sensitivity) 261 (HH) <18 ng/L    Comment: CRITICAL VALUE NOTED. VALUE IS CONSISTENT WITH PREVIOUSLY REPORTED/CALLED  VALUE RH (NOTE) Elevated high sensitivity troponin I (hsTnI) values and significant  changes across serial measurements may suggest ACS but many other  chronic and acute conditions are known to elevate hsTnI results.  Refer to the "Links" section for chest pain algorithms and additional  guidance. Performed at [facility], [address] [place], [place] [address]    ECHO TEE  Result Date: 10/08/2022    TRANSESOPHOGEAL ECHO REPORT   Patient Name:   Ricardo Yang Date of Exam: 10/08/2022 Medical Rec #:  [id number]     Height:       75.0 in Accession #:    [id number]    Weight:       245.6 lb Date of Birth:  [birth year]      BSA:          2.395 m Patient Age:    46 years      BP:           132/96 mmHg Patient Gender: M             HR:           81 bpm. Exam Location:  [facility] Procedure: Transesophageal Echo, Cardiac Doppler and Color Doppler Indications:     Cerebral Infarction, unspecified I63.9  History:         Patient has prior history of Echocardiogram examinations, most                  recent 10/05/2022. CAD and Previous Myocardial Infarction.                  Polysubstance abuse.  Sonographer:     Leta Jungling RDCS Referring Phys:  [id number] Debbe Odea Diagnosing Phys: Debbe Odea MD PROCEDURE: After discussion of the risks and benefits of a TEE, an informed consent was obtained from the patient. TEE procedure time was 10 minutes. The transesophogeal probe was passed without difficulty through the esophogus of the patient. Imaged were obtained with the patient in a left lateral decubitus position. Sedation performed by performing physician. Patients was under conscious sedation during this procedure. Anesthetic administered: of Fentanyl, 1.0mg  of Versed. The patient developed no complications during the procedure. Transgastric views not obtained.  IMPRESSIONS  1. Left ventricular ejection fraction, by estimation, is 20 to 25%. The left ventricle has severely decreased  function. The left ventricle demonstrates global hypokinesis. The left ventricular internal cavity size was mildly to moderately dilated.  2. Right ventricular systolic function is severely reduced. The right ventricular size is normal.  3. No left atrial/left atrial  appendage thrombus was detected. The LAA emptying velocity was 32 cm/s.  4. The mitral valve is normal in structure. Mild mitral valve regurgitation.  5. The aortic valve is tricuspid. Aortic valve regurgitation is trivial.  6. Agitated saline contrast bubble study was negative, with no evidence of any interatrial shunt. Conclusion(s)/Recommendation(s): No LA/LAA thrombus identified. Negative bubble study for interatrial shunt. No intracardiac source of embolism detected on this on this transesophageal echocardiogram. FINDINGS  Left Ventricle: Left ventricular ejection fraction, by estimation, is 20 to 25%. The left ventricle has severely decreased function. The left ventricle demonstrates global hypokinesis. The left ventricular internal cavity size was mildly to moderately dilated. Right Ventricle: The right ventricular size is normal. No increase in right ventricular wall thickness. Right ventricular systolic function is severely reduced. Left Atrium: Left atrial size was not well visualized. No left atrial/left atrial appendage thrombus was detected. The LAA emptying velocity was 32 cm/s. Right Atrium: Right atrial size was normal in size. Pericardium: There is no evidence of pericardial effusion. Mitral Valve: The mitral valve is normal in structure. Mild mitral valve regurgitation. Tricuspid Valve: The tricuspid valve is normal in structure. Tricuspid valve regurgitation is mild. Aortic Valve: The aortic valve is tricuspid. Aortic valve regurgitation is trivial. Pulmonic Valve: The pulmonic valve was normal in structure. Pulmonic valve regurgitation is not visualized. Aorta: The aortic root is normal in size and structure. IAS/Shunts: No atrial  level shunt detected by color flow Doppler. Agitated saline contrast was given intravenously to evaluate for intracardiac shunting. Agitated saline contrast bubble study was negative, with no evidence of any interatrial shunt. Additional Comments: Spectral Doppler performed. TRICUSPID VALVE TR Peak grad:   44.4 mmHg TR Vmax:        333.00 cm/s Debbe Odea MD Electronically signed by Debbe Odea MD Signature Date/Time: 10/08/2022/3:19:31 PM    Final       Blood pressure (!) 126/92, pulse 76, temperature 98 F (36.7 C), temperature source Oral, resp. rate 16, height 6\' 3"  (1.905 m), weight 111.4 kg, SpO2 98 %.  Medical Problem List and Plan: 1. Functional deficits secondary to left MCA territory infarction  -patient may *** shower  -ELOS/Goals: *** 2.  Antithrombotics: -DVT/anticoagulation:  Mechanical: Antiembolism stockings, thigh (TED hose) Bilateral lower extremities  -antiplatelet therapy: Aspirin 81 mg daily and Plavix 75 mg daily 3. Pain Management: Tylenol as needed 4. Mood/Behavior/Sleep: Provide emotional support  -antipsychotic agents: N/A 5. Neuropsych/cognition: This patient is not capable of making decisions on his own behalf. 6. Skin/Wound Care: Routine skin checks 7. Fluids/Electrolytes/Nutrition: Routine in and outs with follow-up chemistries 8.  CAD/history of PCI/chronic systolic congestive heart failure with ejection fraction 2025%.  TEE without thrombus.  Monitor for any signs of fluid overload.  Continue Farxiga 10 mg daily.  Follow-up outpatient cardiology services 9.  Hypertension.  Lisinopril 2.5 mg daily, Toprol-XL 12.5 mg daily.  Monitor with increased mobility 10.  Hyperlipidemia.  Lipitor 11.  History of tobacco as well as polysubstance use.  Urine drug screen positive marijuana as well as cocaine.  Provide counseling 12.  Obesity.  BMI 30.70.  Dietary follow-up   ***  , PA-C 10/09/2022

## 2022-10-09 NOTE — Progress Notes (Signed)
Report called to [facility] as [facility]. Patients status gone over with nurse. [facility] also called to make aware patient is now ready to be discharged.

## 2022-10-09 NOTE — Progress Notes (Signed)
PMR Admission Coordinator Pre-Admission Assessment   Patient: Ricardo Yang is an 46 y.o., male MRN: [id number] DOB: 08/17/1976 Height: _0  (190.5 cm) Weight: 111.4 kg   Insurance Information PRIMARY: Uninsured- medicare pending         The Engineer, petroleum" for patients in Inpatient Rehabilitation Facilities with attached "Privacy Act [place] Records" was provided and verbally reviewed with: Patient   Emergency Contact Information Contact Information       Name Relation Home Work Mobile    [place] Significant other     [phone]           Current Medical History  Patient Admitting Diagnosis: L CVA   History of Present Illness: A 46 year old right-handed male with history of reported CVA in the past with no deficits, CAD with anterior STEMI 07/2019, non-STEMI 03/2021/status post PCI with complications of ischemic cardiomyopathy ejection fraction 25 to 30% from an echocardiogram 6/23 maintained on aspirin and Plavix last seen by cardiology services [place] 2022, hypertension maintained on lisinopril 2.5 mg daily, Toprol XL 25 mg daily, tobacco use as well as polysubstance abuse, obesity with BMI 30.70.  Per chart review patient lives with significant other.  1 level home with ramped entrance.  Reportedly independent prior to admission.  resented 10/04/2022 to the ED at [facility] with acute onset of aphasia and right-sided weakness and leftward gaze.  Cranial CT scan with no acute changes.  CT angiogram head and neck no large vessel occlusion.  No atherosclerosis, stenosis or arterial abnormality identified in the head or neck.  MRI showed acute infarcts in the left ACA, left MCA and likely of the right PCA territory with a large infarct involving the left postcentral gyrus.  Patient did not receive tPA.  Admission chemistries unremarkable except WBC 11,300, alcohol negative, urine drug screen positive for cocaine as well as  marijuana, troponin 265-275.  Echocardiogram with ejection fraction less than 20%.  The left ventricle showed severely decreased function as well as global hypokinesis.  TEE completed by cardiology services showing severely reduced systolic function and no mural apical thrombus with estimated ejection fraction 20%.  Bubble study was negative.  No PFO noted.  Elevated troponin felt to be related to demand ischemia.  Patient remains on Plavix and aspirin as prior to admission.  Permissive hypertension with low-dose lisinopril as well as Toprol resumed. Tolerating a regular consistency diet.  Therapy evaluations completed due to patient's aphasia decreased functional mobility and right side weakness.  Patient to be admitted for a comprehensive inpatient rehab program.    Patient's medical record from [facility] has been reviewed by the rehabilitation admission coordinator and physician.   Past Medical History      Past Medical History:  Diagnosis Date   Multiple sclerosis ([place])      a. question possible MS   Obesity     Polysubstance abuse ([place])      a. tobacco, cocaine, crack, ecstasy, pills, "anything but injections"      Has the patient had major surgery during 100 days prior to admission? No   Family History   family history includes Heart disease in his father.   Current Medications   Current Facility-Administered Medications:    acetaminophen (TYLENOL) tablet 650 mg, 650 mg, Oral, Q4H PRN **OR** acetaminophen (TYLENOL) 160 MG/5ML solution 650 mg, 650 mg, Per Tube, Q4H PRN **OR** acetaminophen (TYLENOL) suppository 650 mg, 650 mg, Rectal, Q4H PRN, Agbata, Tochukwu, MD   aspirin  chewable tablet 81 mg, 81 mg, Oral, Daily, Benita Gutter, RPH, 81 mg at 10/09/22 0910   atorvastatin (LIPITOR) tablet 80 mg, 80 mg, Oral, q1800, Agbata, Tochukwu, MD, 80 mg at 10/08/22 2139   clopidogrel (PLAVIX) tablet 75 mg, 75 mg, Oral, Q breakfast, Agbata, Tochukwu, MD, 75 mg at  10/09/22 0910   dapagliflozin propanediol (FARXIGA) tablet 10 mg, 10 mg, Oral, Daily, Tang, Lily Michelle, PA-C, 10 mg at 10/09/22 0910   lactulose (CHRONULAC) 10 GM/15ML solution 20 g, 20 g, Oral, Once, Sharen Hones, MD   lisinopril (ZESTRIL) tablet 2.5 mg, 2.5 mg, Oral, Daily, Sharen Hones, MD, 2.5 mg at 10/09/22 0910   metoprolol succinate (TOPROL-XL) 24 hr tablet 12.5 mg, 12.5 mg, Oral, Daily, Tang, Lily Michelle, PA-C, 12.5 mg at 10/09/22 0910   polyethylene glycol (MIRALAX / GLYCOLAX) packet 17 g, 17 g, Oral, Daily, Sharen Hones, MD   Patients Current Diet:  Diet Order                  Diet - low sodium heart healthy             Diet Heart Room service appropriate? Yes; Fluid consistency: Thin  Diet effective now                         Precautions / Restrictions Precautions Precautions: Shoulder, Fall Type of Shoulder Precautions: Patient does not have shoulder subluxation at this time, however has flaccid RUE, support shoulder joint appropriately. Precaution Booklet Issued: No Restrictions Weight Bearing Restrictions: No RUE Weight Bearing: Non weight bearing    Has the patient had 2 or more falls or a fall with injury in the past year? No   Prior Activity Level Community (5-7x/wk): Independent   Prior Functional Level Self Care: Did the patient need help bathing, dressing, using the toilet or eating? Independent   Indoor Mobility: Did the patient need assistance with walking from room to room (with or without device)? Independent   Stairs: Did the patient need assistance with internal or external stairs (with or without device)? Independent   Functional Cognition: Did the patient need help planning regular tasks such as shopping or remembering to take medications? Independent   Patient Information Are you of Hispanic, Latino/a,or Spanish origin?: A. No, not of Hispanic, Latino/a, or Spanish origin What is your race?: B. Black or African American Do you need or  want an interpreter to communicate with a doctor or health care staff?: 0. No   Patient's Response To:  Health Literacy and Transportation Is the patient able to respond to health literacy and transportation needs?: Yes Health Literacy - How often do you need to have someone help you when you read instructions, pamphlets, or other written material from your doctor or pharmacy?: Never In the past 12 months, has lack of transportation kept you from medical appointments or from getting medications?: No In the past 12 months, has lack of transportation kept you from meetings, work, or from getting things needed for daily living?: No   Development worker, international aid / [place] Devices/Equipment: None   Prior Device Use: Indicate devices/aids used by the patient prior to current illness, exacerbation or injury? None of the above   Current Functional Level Cognition   Arousal/Alertness: Awake/alert Overall Cognitive Status: Within Functional Limits for tasks assessed Orientation Level: Oriented X4 General Comments: eyes closing wihtout stimulation at end of session - very recently returned from TEE procedure  Extremity Assessment (includes Sensation/Coordination)   Upper Extremity Assessment: RUE deficits/detail RUE Deficits / Details: 3-/5 grossly RUE Sensation: decreased light touch RUE Coordination: decreased fine motor, decreased gross motor  Lower Extremity Assessment: RLE deficits/detail RLE Deficits / Details: MAS R hamstring 2, quadriceps 1 RLE Coordination: decreased gross motor     ADLs   Overall ADL's : Needs assistance/impaired Eating/Feeding: Set up, Bed level Eating/Feeding Details (indicate cue type and reason): set up A using non-dominant hand for self-feeding Lower Body Dressing: Maximal assistance, Bed level Lower Body Dressing Details (indicate cue type and reason): socks Toileting- Clothing Manipulation and Hygiene: Maximal assistance, Bed level General  ADL Comments: SETUP + CGA grooming standing sink side, cues for WBing through RUE on counter. MAX A self-drinking using RUE seated in bed     Mobility   Overal bed mobility: Needs Assistance Bed Mobility: Supine to Sit Rolling: Min assist Supine to sit: Min guard Sit to supine: Mod assist General bed mobility comments: deferred 2/2 fatigue from recent procedure     Transfers   Overall transfer level: Needs assistance Equipment used: Hemi-walker Transfers: Sit to/from Stand Sit to Stand: Min assist Bed to/from chair/wheelchair/BSC transfer type:: Squat pivot, Step pivot Stand pivot transfers: Mod assist, +2 physical assistance, Min assist Squat pivot transfers: Mod assist Step pivot transfers: Mod assist, From elevated surface General transfer comment: assist for RUE support     Ambulation / Gait / Stairs / Wheelchair Mobility   Ambulation/Gait Ambulation/Gait assistance: Mod assist, +2 physical assistance, +2 safety/equipment Gait Distance (Feet):  (25 ft + 22 ft + 20 ft) Assistive device:  (L rail in hallway, transitioning to LUE HHA when rail runs out) Gait Pattern/deviations: Step-to pattern General Gait Details: deferred due to emphasis of session Gait velocity: decreased     Posture / Balance Dynamic Sitting Balance Sitting balance - Comments: generally steady in sitting but close suprvision provided. Balance Overall balance assessment: Needs assistance Sitting-balance support: No upper extremity supported, Feet supported Sitting balance-Leahy Scale: Good Sitting balance - Comments: generally steady in sitting but close suprvision provided. Postural control: Right lateral lean, Posterior lean Standing balance support: No upper extremity supported, During functional activity Standing balance-Leahy Scale: Fair Standing balance comment: min/mod assist for midline, weight shift and balance correction     Special needs/care consideration None    Previous Home Environment  (from acute therapy documentation) Living Arrangements: Spouse/significant other  Lives With: Significant other Available Help at Discharge: Family, Available 24 hours/day Type of Home: House Home Layout: One level Home Access: Ramped entrance Bathroom Shower/Tub: Chiropodist: Standard Bathroom Accessibility: No   Discharge Living Setting Plans for Discharge Living Setting: Patient's home, Lives with (comment) ([place]) Type of Home at Discharge: House Discharge Home Layout: One level Discharge Home Access: [place] entrance Discharge Bathroom Shower/Tub: Tub/shower unit Discharge Bathroom Toilet: Standard Discharge Bathroom Accessibility: No Does the patient have any problems obtaining your medications?: No   Social/Family/Support Systems Patient Roles: Partner Anticipated Caregiver: Vergia Alberts Anticipated Caregiver's Contact Information: [phone] Caregiver Availability: 24/7 Discharge Plan Discussed with Primary Caregiver: Yes Is Caregiver In Agreement with Plan?: Yes Does Caregiver/Family have Issues with Lodging/Transportation while Pt is in Rehab?: No   Goals Patient/Family Goal for Rehab: Mod I PT, OT, Independent SLP Expected length of stay: 10-12 days Pt/Family Agrees to Admission and willing to participate: Yes Program Orientation Provided & Reviewed with Pt/Caregiver Including Roles  & Responsibilities: Yes  Barriers to Discharge: Insurance for SNF coverage   Decrease  burden of Care through IP rehab admission: N/A   Possible need for SNF placement upon discharge: not anticipated   Patient Condition: I have reviewed medical records from [facility], spoken with  TOC , and patient. I met with patient at the bedside for inpatient rehabilitation assessment.  Patient will benefit from ongoing PT, OT, and SLP, can actively participate in 3 hours of therapy a day 5 days of the week, and can make measurable gains during the admission.  Patient  will also benefit from the coordinated team approach during an Inpatient Acute Rehabilitation admission.  The patient will receive intensive therapy as well as Rehabilitation physician, nursing, social worker, and care management interventions.  Due to safety, skin/wound care, disease management, medication administration, and patient education the patient requires 24 hour a day rehabilitation nursing.  The patient is currently min to mod assist with mobility and mod to max assist with basic ADLs.  Discharge setting and therapy post discharge at home with home health is anticipated.  Patient has agreed to participate in the Acute Inpatient Rehabilitation Program and will admit today.   Preadmission Screen Completed By:  Retta Diones, 10/09/2022 10:44 AM ______________________________________________________________________   Discussed status with Dr. Jennye Boroughs on 10/09/22 at 0945 and received approval for admission today.   Admission Coordinator:  Retta Diones, RN, time 1050/Date 10/09/22    Assessment/Plan: Diagnosis: CVA Does the need for close, 24 hr/day Medical supervision in concert with the patient's rehab needs make it unreasonable for this patient to be served in a less intensive setting? Yes Co-Morbidities requiring supervision/potential complications: Polysubstance abuse, CAD. HTN, Due to bladder management, bowel management, safety, skin/wound care, disease management, medication administration, pain management, and patient education, does the patient require 24 hr/day rehab nursing? Yes Does the patient require coordinated care of a physician, rehab nurse, PT, OT, and SLP to address physical and functional deficits in the context of the above medical diagnosis(es)? Yes Addressing deficits in the following areas: balance, endurance, locomotion, strength, transferring, bowel/bladder control, bathing, dressing, feeding, grooming, toileting, cognition, speech, language, and  psychosocial support Can the patient actively participate in an intensive therapy program of at least 3 hrs of therapy 5 days a week? Yes The potential for patient to make measurable gains while on inpatient rehab is excellent Anticipated functional outcomes upon discharge from inpatient rehab: modified independent PT, modified independent OT, independent SLP Estimated rehab length of stay to reach the above functional goals is: 10-12 Anticipated discharge destination: Home 10. Overall Rehab/Functional Prognosis: excellent     MD Signature: Jennye Boroughs         Revision History

## 2022-10-09 NOTE — Progress Notes (Signed)
Inpatient Rehabilitation Admission Medication Review by a Pharmacist  A complete drug regimen review was completed for this patient to identify any potential clinically significant medication issues.  High Risk Drug Classes Is patient taking? Indication by Medication  Antipsychotic No   Anticoagulant No   Antibiotic No   Opioid No   Antiplatelet Yes Clopidogrel, Aspirin - CVA  Hypoglycemics/insulin Yes Farxiga - CHF  Vasoactive Medication Yes Metoprolol XL, lisinopril - CHF  Chemotherapy No   Other Yes Atorvastatin - HLD     Type of Medication Issue Identified Description of Issue Recommendation(s)  Drug Interaction(s) (clinically significant)     Duplicate Therapy     Allergy     No Medication Administration End Date     Incorrect Dose     Additional Drug Therapy Needed     Significant med changes from prior encounter (inform family/care partners about these prior to discharge).    Other       Clinically significant medication issues were identified that warrant physician communication and completion of prescribed/recommended actions by midnight of the next day:  No  Pharmacist comments: None  Time spent performing this drug regimen review (minutes):  30 minutes  Thank you Okey Regal, PharmD

## 2022-10-09 NOTE — H&P (Incomplete)
Physical Medicine and Rehabilitation Admission H&P    Chief Complaint  Patient presents with   Code Stroke    LKN 10pm yesterday. At 350 am Girlfriend states heard a thud to floor, found patient in the floor. Non verbal, and unable to move R side with global aphasia. Last CVA 2 years ago with no deficit  : HPI: Ricardo Yang is a 46 year old right-handed male with history of reported CVA in the past with no deficits, CAD with anterior STEMI 07/2019, non-STEMI 03/2021/status post PCI with complications of ischemic cardiomyopathy ejection fraction 25 to 30% from an echocardiogram 6/23 maintained on aspirin and Plavix last seen by cardiology services Dr.Paraschos 2022, hypertension maintained on lisinopril 2.5 mg daily, Toprol XL 25 mg daily, tobacco use as well as polysubstance abuse, obesity with BMI 30.70.  Cocaine positive on presentation.  Per chart review patient lives with significant other.  1 level home with ramped entrance.  Reportedly independent prior to admission.  Presented 10/04/2022 with acute onset of aphasia and right-sided weakness and leftward gaze.  Cranial CT scan with no acute changes.  CT angiogram head and neck no large vessel occlusion.  No atherosclerosis, stenosis or arterial abnormality identified in the head or neck.  MRI showed acute infarcts in the left ACA, left MCA and likely of the right PCA territory with a large infarct involving the left postcentral gyrus.  Patient did not receive tPA.  Admission chemistries unremarkable except WBC 11,300, alcohol negative, urine drug screen positive for cocaine as well as marijuana, troponin 265-275.  Echocardiogram with ejection fraction less than 20%.  The left ventricle showed severely decreased function as well as global hypokinesis.  TEE completed by cardiology services showing severely reduced systolic function and no mural apical thrombus with estimated ejection fraction 20-25%.  Bubble study was negative.  No PFO noted.   Elevated troponin felt to be related to demand ischemia.  Patient remains on Plavix and aspirin as prior to admission.  Permissive hypertension with low-dose lisinopril as well as Toprol resumed.  Tolerating a regular consistency diet.  Therapy evaluations completed due to patient's aphasia decreased functional mobility and right side weakness was admitted for a comprehensive rehab program.  Review of Systems  Constitutional:  Negative for chills and fever.  HENT:  Negative for hearing loss.   Eyes:  Negative for blurred vision and double vision.  Respiratory:  Negative for cough, shortness of breath and wheezing.   Cardiovascular:  Positive for leg swelling. Negative for chest pain and palpitations.  Gastrointestinal:  Positive for constipation. Negative for heartburn, nausea and vomiting.  Genitourinary:  Negative for dysuria, flank pain and hematuria.  Musculoskeletal:  Positive for myalgias.  Skin:  Negative for rash.  Neurological:  Positive for speech change and weakness.  All other systems reviewed and are negative.  Past Medical History:  Diagnosis Date   Multiple sclerosis (HCC)    a. question possible MS   Obesity    Polysubstance abuse (HCC)    a. tobacco, cocaine, crack, ecstasy, pills, "anything but injections"   Past Surgical History:  Procedure Laterality Date   CARDIAC CATHETERIZATION N/A 05/28/2015   Procedure: Left Heart Cath and Coronary Angiography;  Surgeon: Antonieta Iba, MD;  Location: [facility];  Service: Cardiovascular;  Laterality: N/A;   CORONARY ANGIOGRAPHY N/A 04/22/2021   Procedure: CORONARY ANGIOGRAPHY;  Surgeon: Marcina Millard, MD;  Location: [facility];  Service: Cardiovascular;  Laterality: N/A;   CORONARY/GRAFT ACUTE MI  REVASCULARIZATION N/A 08/06/2019   Procedure: Coronary/Graft Acute MI Revascularization;  Surgeon: Marcina Millard, MD;  Location: [facility];  Service: Cardiovascular;  Laterality: N/A;   LEFT  HEART CATH N/A 04/22/2021   Procedure: Left Heart Cath;  Surgeon: Marcina Millard, MD;  Location: [facility];  Service: Cardiovascular;  Laterality: N/A;   LEFT HEART CATH AND CORONARY ANGIOGRAPHY N/A 08/06/2019   Procedure: LEFT HEART CATH AND CORONARY ANGIOGRAPHY;  Surgeon: Marcina Millard, MD;  Location: [facility];  Service: Cardiovascular;  Laterality: N/A;   TEE WITHOUT CARDIOVERSION N/A 10/08/2022   Procedure: TRANSESOPHAGEAL ECHOCARDIOGRAM (TEE);  Surgeon: Debbe Odea, MD;  Location: [facility];  Service: Cardiovascular;  Laterality: N/A;  11:30am   Family History  Problem Relation Age of Onset   Heart disease Father    Social History:  reports that he has been smoking. He has never used smokeless tobacco. He reports current drug use. Frequency: 1.00 time per week. Drugs: "Crack" cocaine, Amphetamines, Cocaine, Hashish, Marijuana, and MDMA (Ecstacy). He reports that he does not drink alcohol. Allergies: No Known Allergies Medications Prior to Admission  Medication Sig Dispense Refill   aspirin 81 MG chewable tablet Chew 1 tablet (81 mg total) by mouth daily. (Patient not taking: Reported on 10/04/2022) 30 tablet 0   atorvastatin (LIPITOR) 80 MG tablet Take 1 tablet (80 mg total) by mouth daily at 6 PM. (Patient not taking: Reported on 10/04/2022) 30 tablet 0   clopidogrel (PLAVIX) 75 MG tablet Take 1 tablet (75 mg total) by mouth daily with breakfast. (Patient not taking: Reported on 10/04/2022) 30 tablet 0   lisinopril (ZESTRIL) 5 MG tablet Take (1/2) tablet (2.5 mg total) by mouth daily. (Patient not taking: Reported on 10/04/2022) 15 tablet 0   metoprolol succinate (TOPROL-XL) 25 MG 24 hr tablet Take 1 tablet (25 mg total) by mouth daily. Take with or immediately following a meal. (Patient not taking: Reported on 10/04/2022) 30 tablet 0   naproxen (NAPROSYN) 500 MG tablet Take 1 tablet (500 mg total) by mouth 2 (two) times daily with a meal. (Patient not  taking: Reported on 10/04/2022) 30 tablet 0   traMADol (ULTRAM) 50 MG tablet Take 1 tablet (50 mg total) by mouth every 6 (six) hours as needed. (Patient not taking: Reported on 10/04/2022) 12 tablet 0      Home: Home Living Family/patient expects to be discharged to:: Private residence Living Arrangements: Spouse/significant other Available Help at Discharge: Family, Available 24 hours/day Type of Home: House Home Access: Ramped entrance Home Layout: One level Bathroom Shower/Tub: Associate Professor: No  Lives With: Significant other   Functional History: Prior Function Prior Level of Function : Independent/Modified Independent, History of Falls (last six months) Mobility Comments: independent no AD ADLs Comments: independent  Functional Status:  Mobility: Bed Mobility Overal bed mobility: Needs Assistance Bed Mobility: Sit to Supine Rolling: Min assist Supine to sit: Min guard Sit to supine: Min assist (for L LE) General bed mobility comments: deferred 2/2 fatigue from recent procedure Transfers Overall transfer level: Needs assistance Equipment used: Hemi-walker Transfers: Sit to/from Stand Sit to Stand: Min assist (Sit to stand from low toilet with ModAx 2) Bed to/from chair/wheelchair/BSC transfer type:: Squat pivot, Step pivot Stand pivot transfers: Mod assist, +2 physical assistance, Min assist Squat pivot transfers: Mod assist Step pivot transfers: Mod assist, From elevated surface General transfer comment: assist for RUE support Ambulation/Gait Ambulation/Gait assistance: Min assist Gait Distance (Feet):  (  40) Assistive device: Hemi-walker Gait Pattern/deviations: Step-to pattern, Decreased step length - left, Decreased dorsiflexion - right General Gait Details: R UE supported at elbow. Pt with 1 occasion of R knee buckling Gait velocity: decreased    ADL: ADL Overall ADL's : Needs  assistance/impaired Eating/Feeding: Set up, Bed level Eating/Feeding Details (indicate cue type and reason): set up A using non-dominant hand for self-feeding Lower Body Dressing: Maximal assistance, Bed level Lower Body Dressing Details (indicate cue type and reason): socks Toileting- Clothing Manipulation and Hygiene: Maximal assistance, Bed level General ADL Comments: SETUP + CGA grooming standing sink side, cues for WBing through RUE on counter. MAX A self-drinking using RUE seated in bed  Cognition: Cognition Overall Cognitive Status: Within Functional Limits for tasks assessed Arousal/Alertness: Awake/alert Orientation Level: Oriented X4 Cognition Arousal/Alertness: Awake/alert Behavior During Therapy: WFL for tasks assessed/performed Overall Cognitive Status: Within Functional Limits for tasks assessed General Comments: eyes closing wihtout stimulation at end of session - very recently returned from TEE procedure  Physical Exam: Blood pressure 115/77, pulse 76, temperature 98.1 F (36.7 C), temperature source Oral, resp. rate 17, height 6\' 3"  (1.905 m), weight 111.4 kg, SpO2 98 %. Physical Exam Neurological:     Comments: Patient is alert.  Mild leftward gaze.  Follows simple commands.  Patient is aphasic having difficulty repeating complex sentences but when taking his time could answer questions appropriately with short responses.      General: Alert and oriented x 3, No apparent distress HEENT: Head is normocephalic, atraumatic, PERRLA, EOMI, sclera anicteric, oral mucosa pink and moist, dentition intact, ext ear canals clear,  Neck: Supple without JVD or lymphadenopathy Heart: Reg rate and rhythm. No murmurs rubs or gallops Chest: CTA bilaterally without wheezes, rales, or rhonchi; no distress Abdomen: Soft, non-tender, non-distended, bowel sounds positive. Extremities: No clubbing, cyanosis, or edema. Pulses are 2+ Psych: Pt's affect is appropriate. Pt is  cooperative Skin: Clean and intact without signs of breakdown Neuro:  alert and oriented x4, follows commands, CN 2-12 intact, normal speech and language. Fair judgement and insight Strength 5/5 in all 4 extremities Sensation intact to LT in all 4 extremities FTN intact b/l  Musculoskeletal: NO joint swelling or tenderness noted, no abnormal tone   Results for orders placed or performed during the hospital encounter of 10/04/22 (from the past 48 hour(s))  Troponin I (High Sensitivity)     Status: Abnormal   Collection Time: 10/07/22 10:38 PM  Result Value Ref Range   Troponin I (High Sensitivity) 265 (HH) <18 ng/L    Comment: CRITICAL RESULT CALLED TO, READ BACK BY AND VERIFIED WITH BRITTANY ROGERS@[id number]  10/07/22 RH (NOTE) Elevated high sensitivity troponin I (hsTnI) values and significant  changes across serial measurements may suggest ACS but many other  chronic and acute conditions are known to elevate hsTnI results.  Refer to the "Links" section for chest pain algorithms and additional  guidance. Performed at [facility], [address] [place], [place] [address]   Troponin I (High Sensitivity)     Status: Abnormal   Collection Time: 10/08/22 12:24 AM  Result Value Ref Range   Troponin I (High Sensitivity) 275 (HH) <18 ng/L    Comment: CRITICAL VALUE NOTED. VALUE IS CONSISTENT WITH PREVIOUSLY REPORTED/CALLED VALUE RH (NOTE) Elevated high sensitivity troponin I (hsTnI) values and significant  changes across serial measurements may suggest ACS but many other  chronic and acute conditions are known to elevate hsTnI results.  Refer to the "Links" section for chest  pain algorithms and additional  guidance. Performed at [facility], [address] [place], [place] [address]   Basic metabolic panel     Status: Abnormal   Collection Time: 10/08/22  4:08 AM  Result Value Ref Range   Sodium 138 135 - 145 mmol/L   Potassium 3.8 3.5 - 5.1 mmol/L   Chloride  107 98 - 111 mmol/L   CO2 25 22 - 32 mmol/L   Glucose, Bld 110 (H) 70 - 99 mg/dL    Comment: Glucose reference range applies only to samples taken after fasting for at least 8 hours.   BUN 15 6 - 20 mg/dL   Creatinine, Ser 5.36 0.61 - 1.24 mg/dL   Calcium 8.9 8.9 - 14.4 mg/dL   GFR, Estimated >31 >54 mL/min    Comment: (NOTE) Calculated using the CKD-EPI Creatinine Equation (2021)    Anion gap 6 5 - 15    Comment: Performed at [facility], [address] [place], [place] [address]  CBC     Status: Abnormal   Collection Time: 10/08/22  4:08 AM  Result Value Ref Range   WBC 11.9 (H) 4.0 - 10.5 K/uL   RBC 5.58 4.22 - 5.81 MIL/uL   Hemoglobin 15.5 13.0 - 17.0 g/dL   HCT 61.9 50.9 - 32.6 %   MCV 85.5 80.0 - 100.0 fL   MCH 27.8 26.0 - 34.0 pg   MCHC 32.5 30.0 - 36.0 g/dL   RDW 71.2 45.8 - 09.9 %   Platelets 252 150 - 400 K/uL   nRBC 0.0 0.0 - 0.2 %    Comment: Performed at [facility], [address] [place], [place] [address]  Troponin I (High Sensitivity)     Status: Abnormal   Collection Time: 10/08/22  4:08 AM  Result Value Ref Range   Troponin I (High Sensitivity) 261 (HH) <18 ng/L    Comment: CRITICAL VALUE NOTED. VALUE IS CONSISTENT WITH PREVIOUSLY REPORTED/CALLED VALUE RH (NOTE) Elevated high sensitivity troponin I (hsTnI) values and significant  changes across serial measurements may suggest ACS but many other  chronic and acute conditions are known to elevate hsTnI results.  Refer to the "Links" section for chest pain algorithms and additional  guidance. Performed at [facility], [address] [place] [place], [place] [address]    ECHO TEE  Result Date: 10/08/2022    TRANSESOPHOGEAL ECHO REPORT   Patient Name:   Ricardo Yang Date of Exam: 10/08/2022 Medical Rec #:  [id number]     Height:       75.0 in Accession #:    [id number]    Weight:       245.6 lb Date of Birth:  09-24-1976      BSA:          2.395 m Patient Age:    46 years      BP:            132/96 mmHg Patient Gender: M             HR:           81 bpm. Exam Location:  [facility] Procedure: Transesophageal Echo, Cardiac Doppler and Color Doppler Indications:     Cerebral Infarction, unspecified I63.9  History:         Patient has prior history of Echocardiogram examinations, most                  recent 10/05/2022. CAD and Previous Myocardial  Infarction.                  Polysubstance abuse.  Sonographer:     Leta Jungling RDCS Referring Phys:  [id number] Debbe Odea Diagnosing Phys: Debbe Odea MD PROCEDURE: After discussion of the risks and benefits of a TEE, an informed consent was obtained from the patient. TEE procedure time was 10 minutes. The transesophogeal probe was passed without difficulty through the esophogus of the patient. Imaged were obtained with the patient in a left lateral decubitus position. Sedation performed by performing physician. Patients was under conscious sedation during this procedure. Anesthetic administered: of Fentanyl, 1.0mg  of Versed. The patient developed no complications during the procedure. Transgastric views not obtained.  IMPRESSIONS  1. Left ventricular ejection fraction, by estimation, is 20 to 25%. The left ventricle has severely decreased function. The left ventricle demonstrates global hypokinesis. The left ventricular internal cavity size was mildly to moderately dilated.  2. Right ventricular systolic function is severely reduced. The right ventricular size is normal.  3. No left atrial/left atrial appendage thrombus was detected. The LAA emptying velocity was 32 cm/s.  4. The mitral valve is normal in structure. Mild mitral valve regurgitation.  5. The aortic valve is tricuspid. Aortic valve regurgitation is trivial.  6. Agitated saline contrast bubble study was negative, with no evidence of any interatrial shunt. Conclusion(s)/Recommendation(s): No LA/LAA thrombus identified. Negative bubble study for interatrial shunt. No  intracardiac source of embolism detected on this on this transesophageal echocardiogram. FINDINGS  Left Ventricle: Left ventricular ejection fraction, by estimation, is 20 to 25%. The left ventricle has severely decreased function. The left ventricle demonstrates global hypokinesis. The left ventricular internal cavity size was mildly to moderately dilated. Right Ventricle: The right ventricular size is normal. No increase in right ventricular wall thickness. Right ventricular systolic function is severely reduced. Left Atrium: Left atrial size was not well visualized. No left atrial/left atrial appendage thrombus was detected. The LAA emptying velocity was 32 cm/s. Right Atrium: Right atrial size was normal in size. Pericardium: There is no evidence of pericardial effusion. Mitral Valve: The mitral valve is normal in structure. Mild mitral valve regurgitation. Tricuspid Valve: The tricuspid valve is normal in structure. Tricuspid valve regurgitation is mild. Aortic Valve: The aortic valve is tricuspid. Aortic valve regurgitation is trivial. Pulmonic Valve: The pulmonic valve was normal in structure. Pulmonic valve regurgitation is not visualized. Aorta: The aortic root is normal in size and structure. IAS/Shunts: No atrial level shunt detected by color flow Doppler. Agitated saline contrast was given intravenously to evaluate for intracardiac shunting. Agitated saline contrast bubble study was negative, with no evidence of any interatrial shunt. Additional Comments: Spectral Doppler performed. TRICUSPID VALVE TR Peak grad:   44.4 mmHg TR Vmax:        333.00 cm/s Debbe Odea MD Electronically signed by Debbe Odea MD Signature Date/Time: 10/08/2022/3:19:31 PM    Final       Blood pressure 115/77, pulse 76, temperature 98.1 F (36.7 C), temperature source Oral, resp. rate 17, height  (1.905 m), weight 111.4 kg, SpO2 98 %.  Medical Problem List and Plan: 1. Functional deficits secondary to  left MCA territory infarction  -patient may  shower  -ELOS/Goals: 10-12 days  -Admit to CIR 2.  Antithrombotics: -DVT/anticoagulation:  Mechanical: Antiembolism stockings, thigh (TED hose) Bilateral lower extremities  -antiplatelet therapy: Aspirin 81 mg daily and Plavix 75 mg daily 3. Pain Management: Tylenol as needed 4. Mood/Behavior/Sleep: Provide emotional support  -  antipsychotic agents: N/A 5. Neuropsych/cognition: This patient is not capable of making decisions on his own behalf. 6. Skin/Wound Care: Routine skin checks 7. Fluids/Electrolytes/Nutrition: Routine in and outs with follow-up chemistries 8.  CAD/history of PCI/chronic systolic congestive heart failure with ejection fraction 20 to 25%.  Daily weights. TEE without thrombus.  Monitor for any signs of fluid overload.  Continue Farxiga 10 mg daily.  Continue asa and plavix.  Follow-up outpatient cardiology services 9.  Hypertension.  Lisinopril 2.5 mg daily, Toprol-XL 12.5 mg daily.  Well controlled. Monitor with increased mobility 10.  Hyperlipidemia.  Lipitor  daily 11.  History of tobacco as well as polysubstance use.  Urine drug screen positive marijuana as well as cocaine.  Provide counseling 12.  Obesity.  BMI 30.70.  Dietary follow-up   Charlton Amor, PA-C 10/09/2022  I have personally performed a face to face diagnostic evaluation of this patient and formulated the key components of the plan.  Additionally, I have personally reviewed laboratory data, imaging studies, as well as relevant notes and concur with the physician assistant's documentation above.  The patient's status has not changed from the original H&P.  Any changes in documentation from the acute care chart have been noted above.  Fanny Dance, MD, FAAPMR\

## 2022-10-09 NOTE — Discharge Summary (Signed)
Physician Discharge Summary   Patient: Ricardo Yang MRN: [id number] DOB: 1976/06/03  Admit date:     10/04/2022  Discharge date: 10/09/22  Discharge Physician: Marrion Coy   PCP: Patient, No Pcp Per   Recommendations at discharge:   With neurology in 1 month. Follow-up with cardiology as scheduled.  Discharge Diagnoses: Principal Problem:   Acute CVA (cerebrovascular accident) [facility]) Active Problems:   Essential hypertension   CAD (coronary artery disease)   Polysubstance abuse (HCC)   Expressive aphasia   Right hemiplegia (HCC)   CAD S/P percutaneous coronary angioplasty   Primary hypertension   Cocaine use   Encounter for imaging to screen for metal prior to MRI   HFrEF (heart failure with reduced ejection fraction) (HCC)   Cerebrovascular accident (CVA) (HCC)  Resolved Problems:   * No resolved hospital problems. *  Hospital Course: Ricardo L Nilsen is a 46 y.o. male with a PMH significant for prior CVA with no deficits, coronary artery disease with complications of ischemic cardiomyopathy with last known LVEF of 25 to 30% from a 2D echocardiogram which was done 06/23, hypertension and nicotine dependence. Presented from home to the ED via EMS on 10/04/2022 with right sided weakness and global aphagia occurring overnight. Pt's partner called EMS and he was transported to hospital with code stroke. CT head showed early changes in the left parietal region, CT angiogram of the neck and head did not show a large vessel obstruction. Brain MRI positive for acute infarcts of left ACA, left MCA and likley right PCA territory with large infarct involving left postcentral gyrus as well as other abnormalities (see report for further detail) pointing to embolic source. He was also found to be cocaine positive on presentation. Echocardiogram showed ejection fraction 20 to 25%, This is grossly worsened from prior study 6/22 with EF 25-30%. No signs of vegetations/arrhythmia TEE performed on  12/13, no vegetation preserved. Recorder will be placed by cardiology.  Assessment and Plan: Acute CVA (cerebrovascular accident) [facility]) Imaging studies as above, patient condition is improving.  TEE did not show any thrombus. Will continue aspirin, Plavix and atorvastatin. Patient is pending transfer to CIR. Follow-up with neurology in 1 month.   Polysubstance abuse (HCC) No evidence of withdrawal.   CAD (coronary artery disease) Chronic systolic congestive heart failure with ejection fraction 20 to 25%. Patient does not have any volume overload.  No acute coronary syndrome.  Follow-up with cardiology as scheduled.     Essential hypertension Restarted home medicines.   Obesity With BMI 30.7.        Consultants: Cardiology, neurology. Procedures performed: None  Disposition: Rehabilitation facility Diet recommendation:  Discharge Diet Orders (From admission, onward)     Start     Ordered   10/09/22 0000  Diet - low sodium heart healthy        10/09/22 1014           Cardiac diet DISCHARGE MEDICATION: Allergies as of 10/09/2022   No Known Allergies      Medication List     STOP taking these medications    naproxen 500 MG tablet Commonly known as: Naprosyn   traMADol 50 MG tablet Commonly known as: Ultram       TAKE these medications    aspirin 81 MG chewable tablet Chew 1 tablet (81 mg total) by mouth daily.   atorvastatin 80 MG tablet Commonly known as: LIPITOR Take 1 tablet (80 mg total) by mouth daily at 6 PM.  clopidogrel 75 MG tablet Commonly known as: PLAVIX Take 1 tablet (75 mg total) by mouth daily with breakfast.   dapagliflozin propanediol 10 MG Tabs tablet Commonly known as: FARXIGA Take 1 tablet (10 mg total) by mouth daily. Start taking on: October 10, 2022   lisinopril 5 MG tablet Commonly known as: ZESTRIL Take (1/2) tablet (2.5 mg total) by mouth daily.   metoprolol succinate 25 MG 24 hr tablet Commonly known as:  TOPROL-XL Take 0.5 tablets (12.5 mg total) by mouth daily. Start taking on: October 10, 2022 What changed:  how much to take additional instructions   polyethylene glycol 17 g packet Commonly known as: MIRALAX / GLYCOLAX Take 17 g by mouth daily.        Follow-up Information     Paraschos, Alexander, MD. Go in 1 week(s).   Specialty: Cardiology Contact information: [address] [facility] [place] [address] [phone]         [facility] Follow up in 1 month(s).   Contact information: [address] [place] [address] [phone]               Discharge Exam: Ceasar Mons Weights   10/04/22 2042 10/07/22 0446  Weight: 109.1 kg 111.4 kg   General exam: Appears calm and comfortable  Respiratory system: Clear to auscultation. Respiratory effort normal. Cardiovascular system: S1 & S2 heard, RRR. No JVD, murmurs, rubs, gallops or clicks. No pedal edema. Gastrointestinal system: Abdomen is nondistended, soft and nontender. No organomegaly or masses felt. Normal bowel sounds heard. Central nervous system: Alert and oriented.  Right-sided hemiparesis. Extremities: Symmetric 5 x 5 power. Skin: No rashes, lesions or ulcers Psychiatry: Judgement and insight appear normal. Mood & affect appropriate.    Condition at discharge: good  The results of significant diagnostics from this hospitalization (including imaging, microbiology, ancillary and laboratory) are listed below for reference.   Imaging Studies: ECHO TEE  Result Date: 10/08/2022    TRANSESOPHOGEAL ECHO REPORT   Patient Name:   Ricardo L Doan Date of Exam: 10/08/2022 Medical Rec #:  [id number]     Height:       75.0 in Accession #:    [id number]    Weight:       245.6 lb Date of Birth:  07-10-1976      BSA:          2.395 m Patient Age:    46 years      BP:           132/96 mmHg Patient Gender: M             HR:           81 bpm.  Exam Location:  [facility] Procedure: Transesophageal Echo, Cardiac Doppler and Color Doppler Indications:     Cerebral Infarction, unspecified I63.9  History:         Patient has prior history of Echocardiogram examinations, most                  recent 10/05/2022. CAD and Previous Myocardial Infarction.                  Polysubstance abuse.  Sonographer:     Leta Jungling RDCS Referring Phys:  [id number] Debbe Odea Diagnosing Phys: Debbe Odea MD PROCEDURE: After discussion of the risks and benefits of a TEE, an informed consent was obtained from the patient. TEE procedure time was 10 minutes. The transesophogeal probe  was passed without difficulty through the esophogus of the patient. Imaged were obtained with the patient in a left lateral decubitus position. Sedation performed by performing physician. Patients was under conscious sedation during this procedure. Anesthetic administered: of Fentanyl, 1.0mg  of Versed. The patient developed no complications during the procedure. Transgastric views not obtained.  IMPRESSIONS  1. Left ventricular ejection fraction, by estimation, is 20 to 25%. The left ventricle has severely decreased function. The left ventricle demonstrates global hypokinesis. The left ventricular internal cavity size was mildly to moderately dilated.  2. Right ventricular systolic function is severely reduced. The right ventricular size is normal.  3. No left atrial/left atrial appendage thrombus was detected. The LAA emptying velocity was 32 cm/s.  4. The mitral valve is normal in structure. Mild mitral valve regurgitation.  5. The aortic valve is tricuspid. Aortic valve regurgitation is trivial.  6. Agitated saline contrast bubble study was negative, with no evidence of any interatrial shunt. Conclusion(s)/Recommendation(s): No LA/LAA thrombus identified. Negative bubble study for interatrial shunt. No intracardiac source of embolism detected on this on this transesophageal  echocardiogram. FINDINGS  Left Ventricle: Left ventricular ejection fraction, by estimation, is 20 to 25%. The left ventricle has severely decreased function. The left ventricle demonstrates global hypokinesis. The left ventricular internal cavity size was mildly to moderately dilated. Right Ventricle: The right ventricular size is normal. No increase in right ventricular wall thickness. Right ventricular systolic function is severely reduced. Left Atrium: Left atrial size was not well visualized. No left atrial/left atrial appendage thrombus was detected. The LAA emptying velocity was 32 cm/s. Right Atrium: Right atrial size was normal in size. Pericardium: There is no evidence of pericardial effusion. Mitral Valve: The mitral valve is normal in structure. Mild mitral valve regurgitation. Tricuspid Valve: The tricuspid valve is normal in structure. Tricuspid valve regurgitation is mild. Aortic Valve: The aortic valve is tricuspid. Aortic valve regurgitation is trivial. Pulmonic Valve: The pulmonic valve was normal in structure. Pulmonic valve regurgitation is not visualized. Aorta: The aortic root is normal in size and structure. IAS/Shunts: No atrial level shunt detected by color flow Doppler. Agitated saline contrast was given intravenously to evaluate for intracardiac shunting. Agitated saline contrast bubble study was negative, with no evidence of any interatrial shunt. Additional Comments: Spectral Doppler performed. TRICUSPID VALVE TR Peak grad:   44.4 mmHg TR Vmax:        333.00 cm/s Debbe Odea MD Electronically signed by Debbe Odea MD Signature Date/Time: 10/08/2022/3:19:31 PM    Final    ECHOCARDIOGRAM COMPLETE  Result Date: 10/06/2022    ECHOCARDIOGRAM REPORT   Patient Name:   Ricardo L Eckersley Date of Exam: 10/05/2022 Medical Rec #:  [id number]     Height:       75.0 in Accession #:    [id number]    Weight:       240.5 lb Date of Birth:  [birth year]      BSA:          2.373 m Patient Age:     46 years      BP:           130/85 mmHg Patient Gender: M             HR:           87 bpm. Exam Location:  [facility] Procedure: 2D Echo, Cardiac Doppler, Color Doppler and Intracardiac            Opacification Agent Indications:  Stroke  History:         Patient has prior history of Echocardiogram examinations. CAD,                  Stroke; Risk Factors:Hypertension and Polysubstance Abuse.  Sonographer:     L. Thornton-Maynard Referring Phys:  [id number] Elwyn Lade AGBATA Diagnosing Phys: Alwyn Pea MD  Sonographer Comments: Suboptimal apical window. IMPRESSIONS  1. Left ventricular ejection fraction, by estimation, is <20%. The left ventricle has severely decreased function. The left ventricle demonstrates global hypokinesis. The left ventricular internal cavity size was severely dilated. Left ventricular diastolic function could not be evaluated.  2. Right ventricular systolic function is mildly reduced. The right ventricular size is mildly enlarged. Mildly increased right ventricular wall thickness. There is normal pulmonary artery systolic pressure.  3. Left atrial size was mildly dilated.  4. Right atrial size was mildly dilated.  5. The mitral valve is normal in structure. Mild mitral valve regurgitation.  6. The aortic valve is normal in structure. Aortic valve regurgitation is trivial. FINDINGS  Left Ventricle: Left ventricular ejection fraction, by estimation, is <20%. The left ventricle has severely decreased function. The left ventricle demonstrates global hypokinesis. Definity contrast agent was given IV to delineate the left ventricular endocardial borders. The left ventricular internal cavity size was severely dilated. There is no left ventricular hypertrophy. Left ventricular diastolic function could not be evaluated. Right Ventricle: The right ventricular size is mildly enlarged. Mildly increased right ventricular wall thickness. Right ventricular systolic function is mildly reduced. There is normal  pulmonary artery systolic pressure. The tricuspid regurgitant velocity is 2.25 m/s, and with an assumed right atrial pressure of 3 mmHg, the estimated right ventricular systolic pressure is 23.2 mmHg. Left Atrium: Left atrial size was mildly dilated. Right Atrium: Right atrial size was mildly dilated. Pericardium: There is no evidence of pericardial effusion. Mitral Valve: The mitral valve is normal in structure. Mild mitral valve regurgitation. MV peak gradient, 3.6 mmHg. The mean mitral valve gradient is 2.0 mmHg. Tricuspid Valve: The tricuspid valve is normal in structure. Tricuspid valve regurgitation is mild. Aortic Valve: The aortic valve is normal in structure. Aortic valve regurgitation is trivial. Aortic valve mean gradient measures 2.0 mmHg. Aortic valve peak gradient measures 3.6 mmHg. Aortic valve area, by VTI measures 3.50 cm. Pulmonic Valve: The pulmonic valve was normal in structure. Pulmonic valve regurgitation is not visualized. Aorta: The ascending aorta was not well visualized. IAS/Shunts: No atrial level shunt detected by color flow Doppler.  LEFT VENTRICLE PLAX 2D LVIDd:         7.00 cm      Diastology LVIDs:         6.25 cm      LV e' medial:    5.00 cm/s LV PW:         0.80 cm      LV E/e' medial:  17.6 LV IVS:        0.70 cm      LV e' lateral:   6.85 cm/s LVOT diam:     2.40 cm      LV E/e' lateral: 12.9 LV SV:         55 LV SV Index:   23 LVOT Area:     4.52 cm  LV Volumes (MOD) LV vol d, MOD A2C: 188.0 ml LV vol d, MOD A4C: 189.0 ml LV vol s, MOD A2C: 136.0 ml LV vol s, MOD A4C: 188.0 ml LV SV MOD A2C:  52.0 ml LV SV MOD A4C:     189.0 ml LV SV MOD BP:      25.2 ml RIGHT VENTRICLE RV Basal diam:  3.60 cm RV S prime:     17.40 cm/s TAPSE (M-mode): 2.0 cm LEFT ATRIUM              Index        RIGHT ATRIUM           Index LA diam:        4.20 cm  1.77 cm/m   RA Area:     14.70 cm LA Vol (A2C):   113.0 ml 47.61 ml/m  RA Volume:   31.10 ml  13.10 ml/m LA Vol (A4C):   68.2 ml  28.74  ml/m LA Biplane Vol: 88.4 ml  37.25 ml/m  AORTIC VALVE                    PULMONIC VALVE AV Area (Vmax):    3.81 cm     PV Vmax:       0.91 m/s AV Area (Vmean):   3.61 cm     PV Peak grad:  3.3 mmHg AV Area (VTI):     3.50 cm AV Vmax:           94.95 cm/s AV Vmean:          66.250 cm/s AV VTI:            0.156 m AV Peak Grad:      3.6 mmHg AV Mean Grad:      2.0 mmHg LVOT Vmax:         80.03 cm/s LVOT Vmean:        52.833 cm/s LVOT VTI:          0.121 m LVOT/AV VTI ratio: 0.77  AORTA Ao Root diam: 4.00 cm Ao Asc diam:  3.20 cm MITRAL VALVE                  TRICUSPID VALVE MV Area (PHT): 6.51 cm       TR Peak grad:   20.2 mmHg MV Area VTI:   2.78 cm       TR Vmax:        225.00 cm/s MV Peak grad:  3.6 mmHg MV Mean grad:  2.0 mmHg       SHUNTS MV Vmax:       0.94 m/s       Systemic VTI:  0.12 m MV Vmean:      58.3 cm/s      Systemic Diam: 2.40 cm MV Decel Time: 117 msec MR Peak grad:    66.6 mmHg MR Mean grad:    44.0 mmHg MR Vmax:         408.00 cm/s MR Vmean:        311.0 cm/s MR PISA:         1.57 cm MR PISA Eff ROA: 14 mm MR PISA Radius:  0.50 cm MV E velocity: 88.07 cm/s Dwayne D Callwood MD Electronically signed by Alwyn Pea MD Signature Date/Time: 10/06/2022/4:41:33 PM    Final    MR BRAIN WO CONTRAST  Result Date: 10/04/2022 CLINICAL DATA:  Stroke suspected EXAM: MRI HEAD WITHOUT CONTRAST TECHNIQUE: Multiplanar, multiecho pulse sequences of the brain and surrounding structures were obtained without intravenous contrast. COMPARISON:  Same day CT brain and head/neck angiogram. MRI Brain 05/21/2009 FINDINGS: Brain: Acute infarcts in the left ACA, left  MCA, and likely the right PCA territory infarcts. There is a large infarct involving the left postcentral gyrus (series 5002, image 42). Additional infarcts are also seen involving the medial cortex of the left frontal lobe and lateral aspect of the left frontal lobe (series 5002, image 31). There is also a small infarct in the posterior right  temporal lobe (series 5002, image 27). Redemonstrated periventricular and subcortical T2/FLAIR hyperintense signal, some of which appear triangular and perpendicular (series 21308, image 30), unchanged from prior. There is a focal region in the right occipital lobe where there is incomplete suppression of FLAIR signal (sereis 15001, image 28) with associated linear susceptibility artifact (series 65784, image 27). Small amount of T2/FLAIR hyperintense signal is also seen at the root entry zone of the right trigeminal nerve (series 69629, image 16). No extra-axial fluid collection. No hydrocephalus. Vascular: Normal flow voids. Skull and upper cervical spine: Normal marrow signal. Sinuses/Orbits: Negative. Other: None. IMPRESSION: 1. Acute infarcts in the left ACA, left MCA, and likely the right PCA territory, with a large infarct involving the left postcentral gyrus. Given distribution, these are favored to represent infarcts related to a central embolic etiology. There is a focal region in the right occipital lobe where there is likely a small amount of subarachnoid blood products, which could suggest an underlying component of vasculitis. Recommend further evaluation with echocardiography. 2. Redemonstrated periventricular and subcortical T2/FLAIR hyperintense signal, as well as at the root entry zone of the right trigeminal nerve, which are suggestive of an underlying demyelinating disease. Electronically Signed   By: Lorenza Cambridge M.D.   On: 10/04/2022 13:23   DG Abd 1 View  Result Date: 10/04/2022 CLINICAL DATA:  Screening for metallic foreign body EXAM: ABDOMEN - 1 VIEW COMPARISON:  None Available. FINDINGS: Nonobstructive bowel gas pattern. Contrast opacify the urinary bladder. Lung bases are clear. No metallic foreign body is visualized. No acute osseous abnormality. IMPRESSION: No metallic foreign body is visualized. Electronically Signed   By: Lorenza Cambridge M.D.   On: 10/04/2022 12:02   DG Chest 1  View  Result Date: 10/04/2022 CLINICAL DATA:  Stroke EXAM: CHEST  1 VIEW COMPARISON:  CXR 04/20/21, CXR 08/06/19 FINDINGS: No pleural effusion. No pneumothorax. No focal airspace opacity. Low lung volumes. There is a least mild interval increase in size of the cardiac contours compared to 04/20/21 even when accounting for differences in technique and lung volumes. No focal airspace opacity. No metallic foreign body. Visualized upper abdomen is unremarkable. IMPRESSION: 1. No metallic foreign body. 2. There is a least mild interval increase in size of the cardiac contours compared to 04/20/21 even when accounting for differences in technique and lung volumes. Consider further evaluation with echocardiography. Electronically Signed   By: Lorenza Cambridge M.D.   On: 10/04/2022 11:59   CT ANGIO HEAD NECK W WO CM W PERF (CODE STROKE)  Result Date: 10/04/2022 CLINICAL DATA:  46 year old male code stroke presentation suspicious for Left hemisphere ELVO. EXAM: CT ANGIOGRAPHY HEAD AND NECK CT PERFUSION BRAIN TECHNIQUE: Multidetector CT imaging of the head and neck was performed using the standard protocol during bolus administration of intravenous contrast. Multiplanar CT image reconstructions and MIPs were obtained to evaluate the vascular anatomy. Carotid stenosis measurements (when applicable) are obtained utilizing NASCET criteria, using the distal internal carotid diameter as the denominator. Multiphase CT imaging of the brain was performed following IV bolus contrast injection. Subsequent parametric perfusion maps were calculated using RAPID software. RADIATION DOSE REDUCTION: This exam was  performed according to the departmental dose-optimization program which includes automated exposure control, adjustment of the mA and/or kV according to patient size and/or use of iterative reconstruction technique. CONTRAST:  100 mL Omnipaque 350 COMPARISON:  Plain head CT 0430 hours. FINDINGS: CT Brain Perfusion Findings: ASPECTS:  10 CBF (<30%) Volume: 73mL Perfusion (Tmax>6.0s) volume: 56mL, however, some of this is bilateral and even registers in the brainstem. There is 12 mm of T-max > 10 S in the posterior left MCA territory. Hypoperfusion index 0.2. Mismatch Volume: Unclear. Infarction Location:Posterior left MCA territory abnormal CBF, abnormal CBV, and T-max > 10 S in an area of up to 12 mL. CTA NECK Skeleton: Scattered carious dentition. Age advanced lower cervical spine degeneration with bulky endplate osteophytosis. No acute osseous abnormality identified. Upper chest: Mild upper lung atelectasis. There is a left coronary artery stent in place. Small volume of dependent retained secretions in the trachea approaching the carina. But otherwise the visible major airways are patent. Other neck: No acute finding. Aortic arch: 3 vessel arch configuration.  No arch atherosclerosis. Right carotid system: Negative. Left carotid system: Negative. Vertebral arteries: Suboptimal but adequate cervical vertebral artery contrast timing. Proximal right subclavian and cervical right vertebral artery appear patent and negative. Proximal left subclavian and codominant cervical left vertebral artery appear patent and negative. CTA HEAD Posterior circulation: Fairly codominant distal vertebral arteries appear patent and within normal limits to the basilar. Right PICA origin appears patent. Left AICA might be dominant, uncertain. Patent basilar artery without stenosis. SCA and PCA origins appear normal. Left posterior communicating artery is present, right is diminutive or absent. Bilateral PCA branches are within normal limits. Anterior circulation: Both ICA siphons are patent, with no siphon plaque or stenosis. Normal left posterior communicating artery. Patent carotid termini, MCA and ACA origins. Codominant A1 segments. Diminutive anterior communicating arteries suspected. Suboptimal intracranial contrast bolus. Allowing for this the ACA branches are  within normal limits. Likewise, the right M1 segment, bifurcation, and right MCA branches are within normal limits. And the left MCA M1 segment also appears symmetric, patent to the trifurcation, and within normal limits. No left MCA branch occlusion is identified. Venous sinuses: Early contrast timing, grossly patent. Anatomic variants: None significant. Review of the MIP images confirms the above findings Preliminary report of the above discussed by telephone Dr. Loleta Rose on 10/04/2022 at 0518 hours. IMPRESSION: 1. Suboptimal although adequate contrast bolus. Strong evidence on CTP of a small posterior Left MCA territory infarct core (approximately 12 mL). CTP also suggests approximately 60 mL of oligemia in the Left MCA territory. However, there is NO large vessel occlusion, and no discrete MCA branch occlusion detected on CTA. 2. No atherosclerosis, stenosis, or arterial abnormality identified in the head or neck. There is a left coronary artery stent visible in the upper chest. 3. Mild upper lung atelectasis and small volume retained secretions in the trachea. Salient findings discussed by telephone with Dr. Loleta Rose on 10/04/2022 at 0518 hours. Electronically Signed   By: Odessa Fleming M.D.   On: 10/04/2022 05:31   CT HEAD CODE STROKE WO CONTRAST  Addendum Date: 10/04/2022   ADDENDUM REPORT: 10/04/2022 04:51 ADDENDUM: Study discussed by telephone with Dr. Loleta Rose on 10/04/2022 at 0445 hours. Clinical picture is highly suspicious for Left MCA ELVO. Patient was undergoing initial tele neurology evaluation as we spoke. Electronically Signed   By: Odessa Fleming M.D.   On: 10/04/2022 04:51   Result Date: 10/04/2022 CLINICAL DATA:  Code stroke. 46 year old  male with right side weakness and aphasia. EXAM: CT HEAD WITHOUT CONTRAST TECHNIQUE: Contiguous axial images were obtained from the base of the skull through the vertex without intravenous contrast. RADIATION DOSE REDUCTION: This exam was performed according  to the departmental dose-optimization program which includes automated exposure control, adjustment of the mA and/or kV according to patient size and/or use of iterative reconstruction technique. COMPARISON:  Head CT 05/20/2009. FINDINGS: Brain: No acute intracranial hemorrhage identified. No midline shift, mass effect, or evidence of intracranial mass lesion. No ventriculomegaly. Normal cerebral volume. No convincing cytotoxic edema. Mild asymmetric enlargement of the left lateral ventricle redemonstrated and normal variant. Vascular: Subtle if any asymmetric hyperdensity of left MCA branches in the sylvian fissure. Skull: No acute osseous abnormality identified. Sinuses/Orbits: Visualized paranasal sinuses and mastoids are stable and well aerated. Other: Leftward gaze. Visualized scalp soft tissues are within normal limits. ASPECTS [facility] Stroke Program Early CT Score) Total score (0-10 with 10 being normal): 10 IMPRESSION: Leftward gaze, but no acute cortically based infarct or intracranial hemorrhage identified. And subtle if any left MCA vessel asymmetry. ASPECTS 10. Electronically Signed: By: Odessa Fleming M.D. On: 10/04/2022 04:40    Microbiology: Results for orders placed or performed during the hospital encounter of 10/04/22  Resp panel by RT-PCR (RSV, Flu A&B, Covid) Anterior Nasal Swab     Status: None   Collection Time: 10/04/22  1:12 PM   Specimen: Anterior Nasal Swab  Result Value Ref Range Status   SARS Coronavirus 2 by RT PCR NEGATIVE NEGATIVE Final    Comment: (NOTE) SARS-CoV-2 target nucleic acids are NOT DETECTED.  The SARS-CoV-2 RNA is generally detectable in upper respiratory specimens during the acute phase of infection. The lowest concentration of SARS-CoV-2 viral copies this assay can detect is 138 copies/mL. A negative result does not preclude SARS-Cov-2 infection and should not be used as the sole basis for treatment or other patient management decisions. A negative result may  occur with  improper specimen collection/handling, submission of specimen other than nasopharyngeal swab, presence of viral mutation(s) within the areas targeted by this assay, and inadequate number of viral copies(<138 copies/mL). A negative result must be combined with clinical observations, patient history, and epidemiological information. The expected result is Negative.  Fact Sheet for Patients:  BloggerCourse.com  Fact Sheet for Healthcare Providers:  SeriousBroker.it  This test is no t yet approved or cleared by the [place] FDA and  has been authorized for detection and/or diagnosis of SARS-CoV-2 by FDA under an Emergency Use Authorization (EUA). This EUA will remain  in effect (meaning this test can be used) for the duration of the COVID-19 declaration under Section 564(b)(1) of the Act, 21 U.S.C.section 360bbb-3(b)(1), unless the authorization is terminated  or revoked sooner.       Influenza A by PCR NEGATIVE NEGATIVE Final   Influenza B by PCR NEGATIVE NEGATIVE Final    Comment: (NOTE) The Xpert Xpress SARS-CoV-2/FLU/RSV plus assay is intended as an aid in the diagnosis of influenza from Nasopharyngeal swab specimens and should not be used as a sole basis for treatment. Nasal washings and aspirates are unacceptable for Xpert Xpress SARS-CoV-2/FLU/RSV testing.  Fact Sheet for Patients: BloggerCourse.com  Fact Sheet for Healthcare Providers: SeriousBroker.it  This test is not yet approved or cleared by the [place] FDA and has been authorized for detection and/or diagnosis of SARS-CoV-2 by FDA under an Emergency Use Authorization (EUA). This EUA will remain in effect (meaning this test can be used) for  the duration of the COVID-19 declaration under Section 564(b)(1) of the Act, 21 U.S.C. section 360bbb-3(b)(1), unless the authorization is terminated  or revoked.     Resp Syncytial Virus by PCR NEGATIVE NEGATIVE Final    Comment: (NOTE) Fact Sheet for Patients: BloggerCourse.com  Fact Sheet for Healthcare Providers: SeriousBroker.it  This test is not yet approved or cleared by the [place] FDA and has been authorized for detection and/or diagnosis of SARS-CoV-2 by FDA under an Emergency Use Authorization (EUA). This EUA will remain in effect (meaning this test can be used) for the duration of the COVID-19 declaration under Section 564(b)(1) of the Act, 21 U.S.C. section 360bbb-3(b)(1), unless the authorization is terminated or revoked.  Performed at [facility], [address] [place], [place] [address]   Culture, blood (Routine X 2) w Reflex to ID Panel     Status: None   Collection Time: 10/04/22  3:00 PM   Specimen: BLOOD  Result Value Ref Range Status   Specimen Description BLOOD BLOOD RIGHT ARM  Final   Special Requests   Final    BOTTLES DRAWN AEROBIC AND ANAEROBIC Blood Culture adequate volume   Culture   Final    NO GROWTH 5 DAYS Performed at [facility], [address] [place], [place] [address]    Report Status 10/09/2022 FINAL  Final  Culture, blood (Routine X 2) w Reflex to ID Panel     Status: None   Collection Time: 10/04/22  3:10 PM   Specimen: BLOOD  Result Value Ref Range Status   Specimen Description BLOOD BLOOD LEFT ARM  Final   Special Requests   Final    BOTTLES DRAWN AEROBIC AND ANAEROBIC Blood Culture results may not be optimal due to an inadequate volume of blood received in culture bottles   Culture   Final    NO GROWTH 5 DAYS Performed at [facility], [address] [place], [place] [address]    Report Status 10/09/2022 FINAL  Final    Labs: CBC: Recent Labs  Lab 10/04/22 0435 10/06/22 1113 10/08/22 0408  WBC 8.7 7.9 11.9*  NEUTROABS 6.8  --   --   HGB 15.0 16.3 15.5  HCT 46.5 49.1 47.7   MCV 87.4 84.9 85.5  PLT 255 255 252   Basic Metabolic Panel: Recent Labs  Lab 10/04/22 0435 10/06/22 1113 10/08/22 0408  NA 142 139 138  K 3.6 3.6 3.8  CL 109 108 107  CO2 25 23 25   GLUCOSE 99 119* 110*  BUN 13 14 15   CREATININE 1.10 0.81 0.83  CALCIUM 9.1 9.2 8.9   Liver Function Tests: Recent Labs  Lab 10/04/22 0435 10/06/22 1113  AST 20 26  ALT 14 16  ALKPHOS 45 48  BILITOT 0.7 1.1  PROT 7.1 7.3  ALBUMIN 3.8 3.6   CBG: Recent Labs  Lab 10/04/22 0440  GLUCAP 108*    Discharge time spent: greater than 30 minutes.  Signed: Marrion Coy, MD [facility] 10/09/2022

## 2022-10-09 NOTE — TOC Transition Note (Signed)
Transition of Care [facility]) - CM/SW Discharge Note   Patient Details  Name: Ricardo Yang MRN: [id number] Date of Birth: 01/02/1976  [facility] CM/SW Contact:  Truddie Hidden, RN Phone Number: 10/09/2022, 10:15 AM   Clinical Narrative:    Per Lelon Frohlich from [facility]. Patient has been approved and bed is available today. MD has been notified.           Patient Goals and CMS Choice        Discharge Placement                       Discharge Plan and Services                                     Social Determinants of Health (SDOH) Interventions     Readmission Risk Interventions     No data to display

## 2022-10-09 NOTE — H&P (Signed)
Physical Medicine and Rehabilitation Admission H&P         Chief Complaint  Patient presents with   Code Stroke      LKN 10pm yesterday. At 350 am Girlfriend states heard a thud to floor, found patient in the floor. Non verbal, and unable to move R side with global aphasia. Last CVA 2 years ago with no deficit  : HPI: Ricardo Yang is a 45 year old right-handed male with history of reported CVA in the past with no deficits, CAD with anterior STEMI 07/2019, non-STEMI 03/2021/status post PCI with complications of ischemic cardiomyopathy ejection fraction 25 to 30% from an echocardiogram 6/23 maintained on aspirin and Plavix last seen by cardiology services Dr.Paraschos 2022, hypertension maintained on lisinopril 2.5 mg daily, Toprol XL 25 mg daily, tobacco use as well as polysubstance abuse, obesity with BMI 30.70.  Cocaine positive on presentation.  Per chart review patient lives with significant other.  1 level home with ramped entrance.  Reportedly independent prior to admission.  Presented 10/04/2022 with acute onset of aphasia and right-sided weakness and leftward gaze.  Cranial CT scan with no acute changes.  CT angiogram head and neck no large vessel occlusion.  No atherosclerosis, stenosis or arterial abnormality identified in the head or neck.  MRI showed acute infarcts in the left ACA, left MCA and likely of the right PCA territory with a large infarct involving the left postcentral gyrus.  Patient did not receive tPA.  Admission chemistries unremarkable except WBC 11,300, alcohol negative, urine drug screen positive for cocaine as well as marijuana, troponin 265-275.  Echocardiogram with ejection fraction less than 20%.  The left ventricle showed severely decreased function as well as global hypokinesis.  TEE completed by cardiology services showing severely reduced systolic function and no mural apical thrombus with estimated ejection fraction 20-25%.  Bubble study was negative.  No PFO  noted.  Elevated troponin felt to be related to demand ischemia.  Patient remains on Plavix and aspirin as prior to admission.  Permissive hypertension with low-dose lisinopril as well as Toprol resumed.  Tolerating a regular consistency diet.  He reports his strength is gradually improving. Therapy evaluations completed due to patient's aphasia decreased functional mobility and right side weakness was admitted for a comprehensive rehab program.   Review of Systems  Constitutional:  Negative for chills and fever.  HENT:  Negative for hearing loss.   Eyes:  Negative for blurred vision and double vision.  Respiratory:  Negative for cough, shortness of breath and wheezing.   Cardiovascular:  Positive for leg swelling. Negative for chest pain and palpitations.  Gastrointestinal:  Positive for constipation. Negative for heartburn, nausea and vomiting.  Genitourinary:  Negative for dysuria, flank pain and hematuria.  Musculoskeletal:  Positive for myalgias.  Skin:  Negative for rash.  Neurological:  Positive for speech change and weakness.  All other systems reviewed and are negative.       Past Medical History:  Diagnosis Date   Multiple sclerosis (HCC)      a. question possible MS   Obesity     Polysubstance abuse (HCC)      a. tobacco, cocaine, crack, ecstasy, pills, "anything but injections"         Past Surgical History:  Procedure Laterality Date   CARDIAC CATHETERIZATION N/A 05/28/2015    Procedure: Left Heart Cath and Coronary Angiography;  Surgeon: Antonieta Iba, MD;  Location: [facility];  Service: Cardiovascular;  Laterality:  N/A;   CORONARY ANGIOGRAPHY N/A 04/22/2021    Procedure: CORONARY ANGIOGRAPHY;  Surgeon: Marcina Millard, MD;  Location: [facility];  Service: Cardiovascular;  Laterality: N/A;   CORONARY/GRAFT ACUTE MI REVASCULARIZATION N/A 08/06/2019    Procedure: Coronary/Graft Acute MI Revascularization;  Surgeon: Marcina Millard, MD;   Location: [facility];  Service: Cardiovascular;  Laterality: N/A;   LEFT HEART CATH N/A 04/22/2021    Procedure: Left Heart Cath;  Surgeon: Marcina Millard, MD;  Location: [facility];  Service: Cardiovascular;  Laterality: N/A;   LEFT HEART CATH AND CORONARY ANGIOGRAPHY N/A 08/06/2019    Procedure: LEFT HEART CATH AND CORONARY ANGIOGRAPHY;  Surgeon: Marcina Millard, MD;  Location: [facility];  Service: Cardiovascular;  Laterality: N/A;   TEE WITHOUT CARDIOVERSION N/A 10/08/2022    Procedure: TRANSESOPHAGEAL ECHOCARDIOGRAM (TEE);  Surgeon: Debbe Odea, MD;  Location: [facility];  Service: Cardiovascular;  Laterality: N/A;  11:30am         Family History  Problem Relation Age of Onset   Heart disease Father      Social History:  reports that he has been smoking. He has never used smokeless tobacco. He reports current drug use. Frequency: 1.00 time per week. Drugs: "Crack" cocaine, Amphetamines, Cocaine, Hashish, Marijuana, and MDMA (Ecstacy). He reports that he does not drink alcohol. Allergies: No Known Allergies       Medications Prior to Admission  Medication Sig Dispense Refill   aspirin 81 MG chewable tablet Chew 1 tablet (81 mg total) by mouth daily. (Patient not taking: Reported on 10/04/2022) 30 tablet 0   atorvastatin (LIPITOR) 80 MG tablet Take 1 tablet (80 mg total) by mouth daily at 6 PM. (Patient not taking: Reported on 10/04/2022) 30 tablet 0   clopidogrel (PLAVIX) 75 MG tablet Take 1 tablet (75 mg total) by mouth daily with breakfast. (Patient not taking: Reported on 10/04/2022) 30 tablet 0   lisinopril (ZESTRIL) 5 MG tablet Take (1/2) tablet (2.5 mg total) by mouth daily. (Patient not taking: Reported on 10/04/2022) 15 tablet 0   metoprolol succinate (TOPROL-XL) 25 MG 24 hr tablet Take 1 tablet (25 mg total) by mouth daily. Take with or immediately following a meal. (Patient not taking: Reported on 10/04/2022) 30 tablet 0   naproxen  (NAPROSYN) 500 MG tablet Take 1 tablet (500 mg total) by mouth 2 (two) times daily with a meal. (Patient not taking: Reported on 10/04/2022) 30 tablet 0   traMADol (ULTRAM) 50 MG tablet Take 1 tablet (50 mg total) by mouth every 6 (six) hours as needed. (Patient not taking: Reported on 10/04/2022) 12 tablet 0          Home: Home Living Family/patient expects to be discharged to:: Private residence Living Arrangements: Spouse/significant other Available Help at Discharge: Family, Available 24 hours/day Type of Home: House Home Access: Ramped entrance Home Layout: One level Bathroom Shower/Tub: Associate Professor: No  Lives With: Significant other   Functional History: Prior Function Prior Level of Function : Independent/Modified Independent, History of Falls (last six months) Mobility Comments: independent no AD ADLs Comments: independent   Functional Status:  Mobility: Bed Mobility Overal bed mobility: Needs Assistance Bed Mobility: Sit to Supine Rolling: Min assist Supine to sit: Min guard Sit to supine: Min assist (for L LE) General bed mobility comments: deferred 2/2 fatigue from recent procedure Transfers Overall transfer level: Needs assistance Equipment used: Hemi-walker Transfers: Sit to/from Stand Sit to Stand: [employer]  assist (Sit to stand from low toilet with ModAx 2) Bed to/from chair/wheelchair/BSC transfer type:: Squat pivot, Step pivot Stand pivot transfers: Mod assist, +2 physical assistance, Min assist Squat pivot transfers: Mod assist Step pivot transfers: Mod assist, From elevated surface General transfer comment: assist for RUE support Ambulation/Gait Ambulation/Gait assistance: Min assist Gait Distance (Feet):  (40) Assistive device: Hemi-walker Gait Pattern/deviations: Step-to pattern, Decreased step length - left, Decreased dorsiflexion - right General Gait Details: R UE supported at elbow. Pt with 1  occasion of R knee buckling Gait velocity: decreased   ADL: ADL Overall ADL's : Needs assistance/impaired Eating/Feeding: Set up, Bed level Eating/Feeding Details (indicate cue type and reason): set up A using non-dominant hand for self-feeding Lower Body Dressing: Maximal assistance, Bed level Lower Body Dressing Details (indicate cue type and reason): socks Toileting- Clothing Manipulation and Hygiene: Maximal assistance, Bed level General ADL Comments: SETUP + CGA grooming standing sink side, cues for WBing through RUE on counter. MAX A self-drinking using RUE seated in bed   Cognition: Cognition Overall Cognitive Status: Within Functional Limits for tasks assessed Arousal/Alertness: Awake/alert Orientation Level: Oriented X4 Cognition Arousal/Alertness: Awake/alert Behavior During Therapy: WFL for tasks assessed/performed Overall Cognitive Status: Within Functional Limits for tasks assessed General Comments: eyes closing wihtout stimulation at end of session - very recently returned from TEE procedure   Physical Exam: Blood pressure 115/77, pulse 76, temperature 98.1 F (36.7 C), temperature source Oral, resp. rate 17, height 6\' 3"  (1.905 m), weight 111.4 kg, SpO2 98 %. Physical Exam Neurological:     Comments: Patient is alert.  Mild leftward gaze.  Follows simple commands.  Patient is aphasic having difficulty repeating complex sentences but when taking his time could answer questions appropriately with short responses.          General: Alert and oriented x 3, No apparent distress HEENT: Head is normocephalic, atraumatic, PERRLA, EOMI, sclera anicteric, oral mucosa pink and moist, dentition intact, ext ear canals clear,  Neck: Supple without JVD or lymphadenopathy Heart: Reg rate and rhythm. No murmurs rubs or gallops Chest: CTA bilaterally without wheezes, rales, or rhonchi; no distress Abdomen: Soft, non-tender, non-distended, bowel sounds positive. Extremities: No  clubbing, cyanosis, or edema. Pulses are 2+ Psych: Pt's affect is appropriate. Pt is cooperative Skin: Clean and intact without signs of breakdown Neuro:  alert and oriented x4, follows commands, CN 2-12 intact other than slight facial weakness, Mild aphasia. Fair judgement and insight.  Strength 5/5 in all LUE and LLE Strength 2-3/5 in RUE shoulder abduction, elbow extension and flexion, finger flexion Strength 4/5 proximal  5/5 distal RLE Sensation intact in all 4 extremities but decreased in RUE FTN intact on Left Heel to shin slightly altered on RLE Musculoskeletal: NO joint swelling or tenderness noted, decreased tone RUE     Lab Results Last 48 Hours        Results for orders placed or performed during the hospital encounter of 10/04/22 (from the past 48 hour(s))  Troponin I (High Sensitivity)     Status: Abnormal    Collection Time: 10/07/22 10:38 PM  Result Value Ref Range    Troponin I (High Sensitivity) 265 (HH) <18 ng/L      Comment: CRITICAL RESULT CALLED TO, READ BACK BY AND VERIFIED WITH BRITTANY ROGERS@[id number]  10/07/22 RH (NOTE) Elevated high sensitivity troponin I (hsTnI) values and significant  changes across serial measurements may suggest ACS but many other  chronic and acute conditions are known to elevate hsTnI results.  Refer to the "Links" section for chest pain algorithms and additional  guidance. Performed at [facility], [address] [place], [place] [address]    Troponin I (High Sensitivity)     Status: Abnormal    Collection Time: 10/08/22 12:24 AM  Result Value Ref Range    Troponin I (High Sensitivity) 275 (HH) <18 ng/L      Comment: CRITICAL VALUE NOTED. VALUE IS CONSISTENT WITH PREVIOUSLY REPORTED/CALLED VALUE RH (NOTE) Elevated high sensitivity troponin I (hsTnI) values and significant  changes across serial measurements may suggest ACS but many other  chronic and acute conditions are known to elevate hsTnI results.  Refer to the  "Links" section for chest pain algorithms and additional  guidance. Performed at [facility], [address] [place], [place] [address]    Basic metabolic panel     Status: Abnormal    Collection Time: 10/08/22  4:08 AM  Result Value Ref Range    Sodium 138 135 - 145 mmol/L    Potassium 3.8 3.5 - 5.1 mmol/L    Chloride 107 98 - 111 mmol/L    CO2 25 22 - 32 mmol/L    Glucose, Bld 110 (H) 70 - 99 mg/dL      Comment: Glucose reference range applies only to samples taken after fasting for at least 8 hours.    BUN 15 6 - 20 mg/dL    Creatinine, Ser 4.58 0.61 - 1.24 mg/dL    Calcium 8.9 8.9 - 09.9 mg/dL    GFR, Estimated >83 >38 mL/min      Comment: (NOTE) Calculated using the CKD-EPI Creatinine Equation (2021)      Anion gap 6 5 - 15      Comment: Performed at [facility], [address] [place], [place] [address]  CBC     Status: Abnormal    Collection Time: 10/08/22  4:08 AM  Result Value Ref Range    WBC 11.9 (H) 4.0 - 10.5 K/uL    RBC 5.58 4.22 - 5.81 MIL/uL    Hemoglobin 15.5 13.0 - 17.0 g/dL    HCT 97.6 73.4 - 19.3 %    MCV 85.5 80.0 - 100.0 fL    MCH 27.8 26.0 - 34.0 pg    MCHC 32.5 30.0 - 36.0 g/dL    RDW 79.0 24.0 - 97.3 %    Platelets 252 150 - 400 K/uL    nRBC 0.0 0.0 - 0.2 %      Comment: Performed at [facility], [address] [place], [place] [address]  Troponin I (High Sensitivity)     Status: Abnormal    Collection Time: 10/08/22  4:08 AM  Result Value Ref Range    Troponin I (High Sensitivity) 261 (HH) <18 ng/L      Comment: CRITICAL VALUE NOTED. VALUE IS CONSISTENT WITH PREVIOUSLY REPORTED/CALLED VALUE RH (NOTE) Elevated high sensitivity troponin I (hsTnI) values and significant  changes across serial measurements may suggest ACS but many other  chronic and acute conditions are known to elevate hsTnI results.  Refer to the "Links" section for chest pain algorithms and additional  guidance. Performed at [facility], [address], [place], [place] [address]         Imaging Results (Last 48 hours)  ECHO TEE   Result Date: 10/08/2022    TRANSESOPHOGEAL ECHO REPORT   Patient Name:   Ricardo Yang Date of Exam: 10/08/2022 Medical Rec #:  [id number]  Height:       75.0 in Accession #:    [id number]    Weight:       245.6 lb Date of Birth:  07/18/1976      BSA:          2.395 m Patient Age:    46 years      BP:           132/96 mmHg Patient Gender: M             HR:           81 bpm. Exam Location:  [facility] Procedure: Transesophageal Echo, Cardiac Doppler and Color Doppler Indications:     Cerebral Infarction, unspecified I63.9  History:         Patient has prior history of Echocardiogram examinations, most                  recent 10/05/2022. CAD and Previous Myocardial Infarction.                  Polysubstance abuse.  Sonographer:     Leta Jungling RDCS Referring Phys:  [id number] Debbe Odea Diagnosing Phys: Debbe Odea MD PROCEDURE: After discussion of the risks and benefits of a TEE, an informed consent was obtained from the patient. TEE procedure time was 10 minutes. The transesophogeal probe was passed without difficulty through the esophogus of the patient. Imaged were obtained with the patient in a left lateral decubitus position. Sedation performed by performing physician. Patients was under conscious sedation during this procedure. Anesthetic administered: of Fentanyl, 1.0mg  of Versed. The patient developed no complications during the procedure. Transgastric views not obtained.  IMPRESSIONS  1. Left ventricular ejection fraction, by estimation, is 20 to 25%. The left ventricle has severely decreased function. The left ventricle demonstrates global hypokinesis. The left ventricular internal cavity size was mildly to moderately dilated.  2. Right ventricular systolic function is severely reduced. The right ventricular size is normal.  3. No left atrial/left atrial appendage thrombus was  detected. The LAA emptying velocity was 32 cm/s.  4. The mitral valve is normal in structure. Mild mitral valve regurgitation.  5. The aortic valve is tricuspid. Aortic valve regurgitation is trivial.  6. Agitated saline contrast bubble study was negative, with no evidence of any interatrial shunt. Conclusion(s)/Recommendation(s): No LA/LAA thrombus identified. Negative bubble study for interatrial shunt. No intracardiac source of embolism detected on this on this transesophageal echocardiogram. FINDINGS  Left Ventricle: Left ventricular ejection fraction, by estimation, is 20 to 25%. The left ventricle has severely decreased function. The left ventricle demonstrates global hypokinesis. The left ventricular internal cavity size was mildly to moderately dilated. Right Ventricle: The right ventricular size is normal. No increase in right ventricular wall thickness. Right ventricular systolic function is severely reduced. Left Atrium: Left atrial size was not well visualized. No left atrial/left atrial appendage thrombus was detected. The LAA emptying velocity was 32 cm/s. Right Atrium: Right atrial size was normal in size. Pericardium: There is no evidence of pericardial effusion. Mitral Valve: The mitral valve is normal in structure. Mild mitral valve regurgitation. Tricuspid Valve: The tricuspid valve is normal in structure. Tricuspid valve regurgitation is mild. Aortic Valve: The aortic valve is tricuspid. Aortic valve regurgitation is trivial. Pulmonic Valve: The pulmonic valve was normal in structure. Pulmonic valve regurgitation is not visualized. Aorta: The aortic root is normal in size and structure. IAS/Shunts: No atrial level shunt detected by color flow Doppler. Agitated saline contrast  was given intravenously to evaluate for intracardiac shunting. Agitated saline contrast bubble study was negative, with no evidence of any interatrial shunt. Additional Comments: Spectral Doppler performed. TRICUSPID VALVE  TR Peak grad:   44.4 mmHg TR Vmax:        333.00 cm/s Debbe Odea MD Electronically signed by Debbe Odea MD Signature Date/Time: 10/08/2022/3:19:31 PM    Final            Blood pressure 115/77, pulse 76, temperature 98.1 F (36.7 C), temperature source Oral, resp. rate 17, height 6\' 3"  (1.905 m), weight 111.4 kg, SpO2 98 %.   Medical Problem List and Plan: 1. Functional deficits secondary to left MCA territory infarction             -patient may  shower             -ELOS/Goals: 10-12 days             -Admit to CIR 2.  Antithrombotics: -DVT/anticoagulation:  Mechanical: Antiembolism stockings, thigh (TED hose) Bilateral lower extremities             -antiplatelet therapy: Aspirin 81 mg daily and Plavix 75 mg daily 3. Pain Management: Tylenol as needed 4. Mood/Behavior/Sleep: Provide emotional support             -antipsychotic agents: N/A 5. Neuropsych/cognition: This patient is not capable of making decisions on his own behalf. 6. Skin/Wound Care: Routine skin checks 7. Fluids/Electrolytes/Nutrition: Routine in and outs with follow-up chemistries 8.  CAD/history of PCI/chronic systolic congestive heart failure with ejection fraction 20 to 25%.  Daily weights. TEE without thrombus.  Monitor for any signs of fluid overload.  Continue Farxiga 10 mg daily.  Continue asa and plavix.  Follow-up outpatient cardiology services 9.  Hypertension.  Lisinopril 2.5 mg daily, Toprol-XL 12.5 mg daily.  Well controlled. Monitor with increased mobility 10.  Hyperlipidemia.  Lipitor 80mg  daily 11.  History of tobacco as well as polysubstance use.  Urine drug screen positive marijuana as well as cocaine.  Provide counseling 12.  Obesity.  BMI 30.70.  Dietary follow-up     , PA-C 10/09/2022   I have personally performed a face to face diagnostic evaluation of this patient and formulated the key components of the plan.  Additionally, I have personally reviewed laboratory  data, imaging studies, as well as relevant notes and concur with the physician assistant's documentation above.   The patient's status has not changed from the original H&P.  Any changes in documentation from the acute care chart have been noted above.   Charlton Amor, MD, FAAPMR\

## 2022-10-09 NOTE — Progress Notes (Signed)
Physical Therapy Treatment Patient Details Name: Ricardo Yang MRN: [id number] DOB: 15-May-1976 Today's Date: 10/09/2022   History of Present Illness 46 year old male with left MCA territory infarct. PMH significant for prior CVA with no deficits, coronary artery disease with complications of ischemic cardiomyopathy    PT Comments    Pt continues to progress functionally. Currently awaiting transportation to CIR today. Session consisted of transfer training and gait training with hemi-walker 64ft x 2. Right elbow supported, vc's given for weight shifting and upright posture. Pt generally required MinA for safety, however did have one occasion of Right knee buckling due to being distracted. Balance regained with ModA. Pt remains very motivated with supportive family, anticipate good progression at CIR.   Recommendations for follow up therapy are one component of a multi-disciplinary discharge planning process, led by the attending physician.  Recommendations may be updated based on patient status, additional functional criteria and insurance authorization.  Follow Up Recommendations  Acute inpatient rehab (3hours/day)     Assistance Recommended at Discharge Frequent or constant Supervision/Assistance  Patient can return home with the following A lot of help with walking and/or transfers;A lot of help with bathing/dressing/bathroom   Equipment Recommendations   (TBD)    Recommendations for Other Services       Precautions / Restrictions Precautions Precautions: Shoulder;Fall Type of Shoulder Precautions: Patient does not have shoulder subluxation at this time, however has flaccid RUE, support shoulder joint appropriately. Restrictions Weight Bearing Restrictions: No     Mobility  Bed Mobility Overal bed mobility: Needs Assistance Bed Mobility: Sit to Supine       Sit to supine: Min assist (for L LE)        Transfers Overall transfer level: Needs assistance Equipment  used: Hemi-walker Transfers: Sit to/from Stand Sit to Stand: Min assist (Sit to stand from low toilet with ModAx 2)           General transfer comment: assist for RUE support    Ambulation/Gait Ambulation/Gait assistance: Min assist Gait Distance (Feet):  (40) Assistive device: Hemi-walker Gait Pattern/deviations: Step-to pattern, Decreased step length - left, Decreased dorsiflexion - right Gait velocity: decreased     General Gait Details: R UE supported at elbow. Pt with 1 occasion of R knee buckling   Stairs             Wheelchair Mobility    Modified Rankin (Stroke Patients Only)       Balance Overall balance assessment: Needs assistance Sitting-balance support: No upper extremity supported, Feet supported Sitting balance-Leahy Scale: Good     Standing balance support: During functional activity, Single extremity supported, Reliant on assistive device for balance Standing balance-Leahy Scale: Fair                              Cognition Arousal/Alertness: Awake/alert Behavior During Therapy: WFL for tasks assessed/performed Overall Cognitive Status: Within Functional Limits for tasks assessed                                          Exercises General Exercises - Lower Extremity Ankle Circles/Pumps: AROM, Left, AAROM, Right, 20 reps Heel Slides: AROM, Left, AAROM, Right, 10 reps Hip ABduction/ADduction: AROM, Left, AAROM, Right, 10 reps    General Comments General comments (skin integrity, edema, etc.):  (Pt educated on role of PT and CIR  transition.)      Pertinent Vitals/Pain Pain Assessment Pain Assessment: No/denies pain    Home Living                          Prior Function            PT Goals (current goals can now be found in the care plan section) Acute Rehab PT Goals Patient Stated Goal: very eager for discharge to [facility] Progress towards PT goals: Progressing toward goals     Frequency    7X/week      PT Plan Current plan remains appropriate    Co-evaluation              AM-PAC PT "6 Clicks" Mobility   Outcome Measure  Help needed turning from your back to your side while in a flat bed without using bedrails?: A Little Help needed moving from lying on your back to sitting on the side of a flat bed without using bedrails?: A Little Help needed moving to and from a bed to a chair (including a wheelchair)?: A Lot Help needed standing up from a chair using your arms (e.g., wheelchair or bedside chair)?: A Lot Help needed to walk in hospital room?: A Lot Help needed climbing 3-5 steps with a railing? : A Lot 6 Click Score: 14    End of Session Equipment Utilized During Treatment: Gait belt Activity Tolerance: Patient tolerated treatment well Patient left: in bed;with call bell/phone within reach;with bed alarm set;with family/visitor present Nurse Communication: Mobility status PT Visit Diagnosis: Muscle weakness (generalized) (M62.81);Difficulty in walking, not elsewhere classified (R26.2);Hemiplegia and hemiparesis;Unsteadiness on feet (R26.81);Other abnormalities of gait and mobility (R26.89) Hemiplegia - Right/Left: Right Hemiplegia - dominant/non-dominant: Dominant Hemiplegia - caused by: Cerebral infarction     Time: 2725-3664 PT Time Calculation (min) (ACUTE ONLY): 39 min  Charges:  $Gait Training: 8-22 mins $Therapeutic Exercise: 8-22 mins $Therapeutic Activity: 8-22 mins                    Zadie Cleverly, PTA    Jannet Askew 10/09/2022, 11:50 AM

## 2022-10-09 NOTE — Progress Notes (Signed)
[facility] on the floor to transport patient to [facility]. Patient on ra, no distress noted. Packet given to [facility] staff.

## 2022-10-10 DIAGNOSIS — I25810 Atherosclerosis of coronary artery bypass graft(s) without angina pectoris: Secondary | ICD-10-CM

## 2022-10-10 DIAGNOSIS — G4700 Insomnia, unspecified: Secondary | ICD-10-CM

## 2022-10-10 DIAGNOSIS — D72829 Elevated white blood cell count, unspecified: Secondary | ICD-10-CM

## 2022-10-10 LAB — CBC WITH DIFFERENTIAL/PLATELET
Abs Immature Granulocytes: 0.02 10*3/uL (ref 0.00–0.07)
Basophils Absolute: 0 10*3/uL (ref 0.0–0.1)
Basophils Relative: 0 %
Eosinophils Absolute: 0.1 10*3/uL (ref 0.0–0.5)
Eosinophils Relative: 1 %
HCT: 50.1 % (ref 39.0–52.0)
Hemoglobin: 16.9 g/dL (ref 13.0–17.0)
Immature Granulocytes: 0 %
Lymphocytes Relative: 28 %
Lymphs Abs: 2.2 10*3/uL (ref 0.7–4.0)
MCH: 29 pg (ref 26.0–34.0)
MCHC: 33.7 g/dL (ref 30.0–36.0)
MCV: 85.9 fL (ref 80.0–100.0)
Monocytes Absolute: 0.5 10*3/uL (ref 0.1–1.0)
Monocytes Relative: 7 %
Neutro Abs: 4.9 10*3/uL (ref 1.7–7.7)
Neutrophils Relative %: 64 %
Platelets: 257 10*3/uL (ref 150–400)
RBC: 5.83 MIL/uL — ABNORMAL HIGH (ref 4.22–5.81)
RDW: 13.3 % (ref 11.5–15.5)
WBC: 7.7 10*3/uL (ref 4.0–10.5)
nRBC: 0 % (ref 0.0–0.2)

## 2022-10-10 LAB — COMPREHENSIVE METABOLIC PANEL
ALT: 22 U/L (ref 0–44)
AST: 21 U/L (ref 15–41)
Albumin: 3.3 g/dL — ABNORMAL LOW (ref 3.5–5.0)
Alkaline Phosphatase: 46 U/L (ref 38–126)
Anion gap: 9 (ref 5–15)
BUN: 15 mg/dL (ref 6–20)
CO2: 28 mmol/L (ref 22–32)
Calcium: 9.3 mg/dL (ref 8.9–10.3)
Chloride: 100 mmol/L (ref 98–111)
Creatinine, Ser: 1.1 mg/dL (ref 0.61–1.24)
GFR, Estimated: >60 mL/min (ref >60)
Glucose, Bld: 92 mg/dL (ref 70–99)
Potassium: 3.7 mmol/L (ref 3.5–5.1)
Sodium: 137 mmol/L (ref 135–145)
Total Bilirubin: 0.7 mg/dL (ref 0.3–1.2)
Total Protein: 6.8 g/dL (ref 6.5–8.1)

## 2022-10-10 MED ORDER — MELATONIN 3 MG PO TABS
3.0000 mg | ORAL_TABLET | Freq: Every day | ORAL | Status: DC
  Administered 2022-10-10 – 2022-10-22 (×13): 3 mg via ORAL
  Filled 2022-10-10 (×13): qty 1

## 2022-10-10 NOTE — Progress Notes (Signed)
Inpatient Rehabilitation  Patient information reviewed and entered into eRehab system by Nyhla Mountjoy Meira Wahba, OTR/L, Rehab Quality Coordinator.   Information including medical coding, functional ability and quality indicators will be reviewed and updated through discharge.   

## 2022-10-10 NOTE — Discharge Instructions (Addendum)
Inpatient Rehab Discharge Instructions  Ricardo Yang Discharge date and time: No discharge date for patient encounter.   Activities/Precautions/ Functional Status: Activity: activity as tolerated Diet: regular diet Wound Care: Routine skin checks Functional status:  ___ No restrictions     ___ Walk up steps independently ___ 24/7 supervision/assistance   ___ Walk up steps with assistance ___ Intermittent supervision/assistance  ___ Bathe/dress independently ___ Walk with walker     _x__ Bathe/dress with assistance ___ Walk Independently    ___ Shower independently ___ Walk with assistance    ___ Shower with assistance ___ No alcohol     ___ Return to work/school ________  Special Instructions:    COMMUNITY REFERRALS UPON DISCHARGE:   HOME HEALTH WOULD NOT ACCEPT DUE TO DRUG HISTORY   Outpatient: PT             Agency: [facility] Phone:  [phone]             Appointment Date/Time:WILL NEED TO CALL   Medical Equipment/Items Ordered:NONE NEEDED                                                 Agency/Supplier:NA    No driving smoking or alcoholSTROKE/TIA DISCHARGE INSTRUCTIONS SMOKING Cigarette smoking nearly doubles your risk of having a stroke & is the single most alterable risk factor  If you smoke or have smoked in the last 12 months, you are advised to quit smoking for your health. Most of the excess cardiovascular risk related to smoking disappears within a year of stopping. Ask you doctor about anti-smoking medications [place] Quit Line: 1-800-QUIT NOW Free Smoking Cessation Classes [phone]  CHOLESTEROL Know your levels; limit fat & cholesterol in your diet  Lipid Panel     Component Value Date/Time   CHOL 155 10/05/2022 0640   TRIG 57 10/05/2022 0640   HDL 36 (Ricardo) 10/05/2022 0640   CHOLHDL 4.3 10/05/2022 0640   VLDL 11 10/05/2022 0640   LDLCALC 108 (H) 10/05/2022 0640     Many patients benefit from treatment even if their cholesterol is at goal. Goal:  Total Cholesterol (CHOL) less than 160 Goal:  Triglycerides (TRIG) less than 150 Goal:  HDL greater than 40 Goal:  LDL (LDLCALC) less than 100   BLOOD PRESSURE American Stroke Association blood pressure target is less that 120/80 mm/Hg  Your discharge blood pressure is:  BP: 112/79 Monitor your blood pressure Limit your salt and alcohol intake Many individuals will require more than one medication for high blood pressure  DIABETES (A1c is a blood sugar average for last 3 months) Goal HGBA1c is under 7% (HBGA1c is blood sugar average for last 3 months)  Diabetes: No known diagnosis of diabetes    Lab Results  Component Value Date   HGBA1C 5.4 10/04/2022    Your HGBA1c can be lowered with medications, healthy diet, and exercise. Check your blood sugar as directed by your physician Call your physician if you experience unexplained or low blood sugars.  PHYSICAL ACTIVITY/REHABILITATION Goal is 30 minutes at least 4 days per week  Activity: Increase activity slowly, Therapies: Physical Therapy: Home Health Return to work:  Activity decreases your risk of heart attack and stroke and makes your heart stronger.  It helps control your weight and blood pressure; helps you relax and can improve your mood. Participate  in a regular exercise program. Talk with your doctor about the best form of exercise for you (dancing, walking, swimming, cycling).  DIET/WEIGHT Goal is to maintain a healthy weight  Your discharge diet is:  Diet Order             Diet Heart Room service appropriate? Yes; Fluid consistency: Thin  Diet effective now                   liquids Your height is:  Height: 6\' 3"  (190.5 cm) Your current weight is: Weight: 105.9 kg Your Body Mass Index (BMI) is:  BMI (Calculated): 29.18 Following the type of diet specifically designed for you will help prevent another stroke. Your goal weight range is:   Your goal Body Mass Index (BMI) is 19-24. Healthy food habits can help reduce  3 risk factors for stroke:  High cholesterol, hypertension, and excess weight.  RESOURCES Stroke/Support Group:  Call [phone]   STROKE EDUCATION PROVIDED/REVIEWED AND GIVEN TO PATIENT Stroke warning signs and symptoms How to activate emergency medical system (call 911). Medications prescribed at discharge. Need for follow-up after discharge. Personal risk factors for stroke. Pneumonia vaccine given: No Flu vaccine given: No My questions have been answered, the writing is legible, and I understand these instructions.  I will adhere to these goals & educational materials that have been provided to me after my discharge from the hospital.      My questions have been answered and I understand these instructions. I will adhere to these goals and the provided educational materials after my discharge from the hospital.  Patient/Caregiver Signature _______________________________ Date __________  Clinician Signature _______________________________________ Date __________  Please bring this form and your medication list with you to all your follow-up doctor's appointments. Inpatient Rehab Discharge Instructions  [phone] Ricardo Yang Discharge date and time: No discharge date for patient encounter.   Activities/Precautions/ Functional Status: Activity: activity as tolerated Diet: regular diet Wound Care: Routine skin checks Functional status:  ___ No restrictions     ___ Walk up steps independently ___ 24/7 supervision/assistance   ___ Walk up steps with assistance ___ Intermittent supervision/assistance  ___ Bathe/dress independently ___ Walk with walker     _x__ Bathe/dress with assistance ___ Walk Independently    ___ Shower independently ___ Walk with assistance    ___ Shower with assistance ___ No alcohol     ___ Return to work/school ________  Special Instructions: No driving smoking or alcoholSTROKE/TIA DISCHARGE INSTRUCTIONS SMOKING Cigarette smoking nearly doubles your risk of  having a stroke & is the single most alterable risk factor  If you smoke or have smoked in the last 12 months, you are advised to quit smoking for your health. Most of the excess cardiovascular risk related to smoking disappears within a year of stopping. Ask you doctor about anti-smoking medications [place] Quit Line: 1-800-QUIT NOW Free Smoking Cessation Classes [phone]  CHOLESTEROL Know your levels; limit fat & cholesterol in your diet  Lipid Panel     Component Value Date/Time   CHOL 155 10/05/2022 0640   TRIG 57 10/05/2022 0640   HDL 36 (Ricardo) 10/05/2022 0640   CHOLHDL 4.3 10/05/2022 0640   VLDL 11 10/05/2022 0640   LDLCALC 108 (H) 10/05/2022 0640     Many patients benefit from treatment even if their cholesterol is at goal. Goal: Total Cholesterol (CHOL) less than 160 Goal:  Triglycerides (TRIG) less than 150 Goal:  HDL greater than 40 Goal:  LDL (LDLCALC) less  than 100   BLOOD PRESSURE American Stroke Association blood pressure target is less that 120/80 mm/Hg  Your discharge blood pressure is:  BP: 112/79 Monitor your blood pressure Limit your salt and alcohol intake Many individuals will require more than one medication for high blood pressure  DIABETES (A1c is a blood sugar average for last 3 months) Goal HGBA1c is under 7% (HBGA1c is blood sugar average for last 3 months)  Diabetes: No known diagnosis of diabetes    Lab Results  Component Value Date   HGBA1C 5.4 10/04/2022    Your HGBA1c can be lowered with medications, healthy diet, and exercise. Check your blood sugar as directed by your physician Call your physician if you experience unexplained or low blood sugars.  PHYSICAL ACTIVITY/REHABILITATION Goal is 30 minutes at least 4 days per week  Activity: Increase activity slowly, Therapies: Physical Therapy: Home Health Return to work:  Activity decreases your risk of heart attack and stroke and makes your heart stronger.  It helps control your weight and blood  pressure; helps you relax and can improve your mood. Participate in a regular exercise program. Talk with your doctor about the best form of exercise for you (dancing, walking, swimming, cycling).  DIET/WEIGHT Goal is to maintain a healthy weight  Your discharge diet is:  Diet Order             Diet Heart Room service appropriate? Yes; Fluid consistency: Thin  Diet effective now                   liquids Your height is:  Height: 6\' 3"  (190.5 cm) Your current weight is: Weight: 105.9 kg Your Body Mass Index (BMI) is:  BMI (Calculated): 29.18 Following the type of diet specifically designed for you will help prevent another stroke. Your goal weight range is:   Your goal Body Mass Index (BMI) is 19-24. Healthy food habits can help reduce 3 risk factors for stroke:  High cholesterol, hypertension, and excess weight.  RESOURCES Stroke/Support Group:  Call [phone]   STROKE EDUCATION PROVIDED/REVIEWED AND GIVEN TO PATIENT Stroke warning signs and symptoms How to activate emergency medical system (call 911). Medications prescribed at discharge. Need for follow-up after discharge. Personal risk factors for stroke. Pneumonia vaccine given: No Flu vaccine given: No My questions have been answered, the writing is legible, and I understand these instructions.  I will adhere to these goals & educational materials that have been provided to me after my discharge from the hospital.      My questions have been answered and I understand these instructions. I will adhere to these goals and the provided educational materials after my discharge from the hospital.  Patient/Caregiver Signature _______________________________ Date __________  Clinician Signature _______________________________________ Date __________  Please bring this form and your medication list with you to all your follow-up doctor's appointments. Inpatient Rehab Discharge Instructions  Ricardo Yang Discharge date and  time: No discharge date for patient encounter.   Activities/Precautions/ Functional Status: Activity: As tolerated Diet: regular diet Wound Care: Routine skin checks Functional status:  ___ No restrictions     ___ Walk up steps independently ___ 24/7 supervision/assistance   ___ Walk up steps with assistance ___ Intermittent supervision/assistance  ___ Bathe/dress independently ___ Walk with walker     _x__ Bathe/dress with assistance ___ Walk Independently    ___ Shower independently ___ Walk with assistance    ___ Shower with assistance ___ No alcohol     ___  Return to work/school ________  Special Instructions: No driving smoking or alcoholSTROKE/TIA DISCHARGE INSTRUCTIONS SMOKING Cigarette smoking nearly doubles your risk of having a stroke & is the single most alterable risk factor  If you smoke or have smoked in the last 12 months, you are advised to quit smoking for your health. Most of the excess cardiovascular risk related to smoking disappears within a year of stopping. Ask you doctor about anti-smoking medications [place] Quit Line: 1-800-QUIT NOW Free Smoking Cessation Classes [phone]  CHOLESTEROL Know your levels; limit fat & cholesterol in your diet  Lipid Panel     Component Value Date/Time   CHOL 155 10/05/2022 0640   TRIG 57 10/05/2022 0640   HDL 36 (Ricardo) 10/05/2022 0640   CHOLHDL 4.3 10/05/2022 0640   VLDL 11 10/05/2022 0640   LDLCALC 108 (H) 10/05/2022 0640     Many patients benefit from treatment even if their cholesterol is at goal. Goal: Total Cholesterol (CHOL) less than 160 Goal:  Triglycerides (TRIG) less than 150 Goal:  HDL greater than 40 Goal:  LDL (LDLCALC) less than 100   BLOOD PRESSURE American Stroke Association blood pressure target is less that 120/80 mm/Hg  Your discharge blood pressure is:  BP: 112/79 Monitor your blood pressure Limit your salt and alcohol intake Many individuals will require more than one medication for high blood  pressure  DIABETES (A1c is a blood sugar average for last 3 months) Goal HGBA1c is under 7% (HBGA1c is blood sugar average for last 3 months)  Diabetes: No known diagnosis of diabetes    Lab Results  Component Value Date   HGBA1C 5.4 10/04/2022    Your HGBA1c can be lowered with medications, healthy diet, and exercise. Check your blood sugar as directed by your physician Call your physician if you experience unexplained or low blood sugars.  PHYSICAL ACTIVITY/REHABILITATION Goal is 30 minutes at least 4 days per week  Activity: Increase activity slowly, Therapies: Physical Therapy: Home Health Return to work:  Activity decreases your risk of heart attack and stroke and makes your heart stronger.  It helps control your weight and blood pressure; helps you relax and can improve your mood. Participate in a regular exercise program. Talk with your doctor about the best form of exercise for you (dancing, walking, swimming, cycling).  DIET/WEIGHT Goal is to maintain a healthy weight  Your discharge diet is:  Diet Order             Diet Heart Room service appropriate? Yes; Fluid consistency: Thin  Diet effective now                   liquids Your height is:  Height: 6\' 3"  (190.5 cm) Your current weight is: Weight: 105.9 kg Your Body Mass Index (BMI) is:  BMI (Calculated): 29.18 Following the type of diet specifically designed for you will help prevent another stroke. Your goal weight range is:   Your goal Body Mass Index (BMI) is 19-24. Healthy food habits can help reduce 3 risk factors for stroke:  High cholesterol, hypertension, and excess weight.  RESOURCES Stroke/Support Group:  Call [phone]   STROKE EDUCATION PROVIDED/REVIEWED AND GIVEN TO PATIENT Stroke warning signs and symptoms How to activate emergency medical system (call 911). Medications prescribed at discharge. Need for follow-up after discharge. Personal risk factors for stroke. Pneumonia vaccine given: No Flu  vaccine given: No My questions have been answered, the writing is legible, and I understand these instructions.  I will  adhere to these goals & educational materials that have been provided to me after my discharge from the hospital.   [place] Cigarette smoking nearly doubles your risk of having a stroke & is the single most alterable risk factor  If you smoke or have smoked in the last 12 months, you are advised to quit smoking for your health. Most of the excess cardiovascular risk related to smoking disappears within a year of stopping. Ask you doctor about anti-smoking medications [place] Quit Line: 1-800-QUIT NOW Free Smoking Cessation Classes [phone]  CHOLESTEROL Know your levels; limit fat & cholesterol in your diet  Lipid Panel     Component Value Date/Time   CHOL 155 10/05/2022 0640   TRIG 57 10/05/2022 0640   HDL 36 (Ricardo) 10/05/2022 0640   CHOLHDL 4.3 10/05/2022 0640   VLDL 11 10/05/2022 0640   LDLCALC 108 (H) 10/05/2022 0640     Many patients benefit from treatment even if their cholesterol is at goal. Goal: Total Cholesterol (CHOL) less than 160 Goal:  Triglycerides (TRIG) less than 150 Goal:  HDL greater than 40 Goal:  LDL (LDLCALC) less than 100   BLOOD PRESSURE American Stroke Association blood pressure target is less that 120/80 mm/Hg  Your discharge blood pressure is:  BP: 112/79 Monitor your blood pressure Limit your salt and alcohol intake Many individuals will require more than one medication for high blood pressure  DIABETES (A1c is a blood sugar average for last 3 months) Goal HGBA1c is under 7% (HBGA1c is blood sugar average for last 3 months)  Diabetes: No known diagnosis of diabetes    Lab Results  Component Value Date   HGBA1C 5.4 10/04/2022    Your HGBA1c can be lowered with medications, healthy diet, and exercise. Check your blood sugar as directed by your physician Call your physician if you experience unexplained  or low blood sugars.  PHYSICAL ACTIVITY/REHABILITATION Goal is 30 minutes at least 4 days per week  Activity: Increase activity slowly, Therapies: Physical Therapy: Home Health Return to work:  Activity decreases your risk of heart attack and stroke and makes your heart stronger.  It helps control your weight and blood pressure; helps you relax and can improve your mood. Participate in a regular exercise program. Talk with your doctor about the best form of exercise for you (dancing, walking, swimming, cycling).  DIET/WEIGHT Goal is to maintain a healthy weight  Your discharge diet is:  Diet Order             Diet Heart Room service appropriate? Yes; Fluid consistency: Thin  Diet effective now                   liquids Your height is:  Height: 6\' 3"  (190.5 cm) Your current weight is: Weight: 105.9 kg Your Body Mass Index (BMI) is:  BMI (Calculated): 29.18 Following the type of diet specifically designed for you will help prevent another stroke. Your goal weight range is:   Your goal Body Mass Index (BMI) is 19-24. Healthy food habits can help reduce 3 risk factors for stroke:  High cholesterol, hypertension, and excess weight.  RESOURCES Stroke/Support Group:  Call [phone]   STROKE EDUCATION PROVIDED/REVIEWED AND GIVEN TO PATIENT Stroke warning signs and symptoms How to activate emergency medical system (call 911). Medications prescribed at discharge. Need for follow-up after discharge. Personal risk factors for stroke. Pneumonia vaccine given: No Flu vaccine given: No My questions have been answered, the writing is  legible, and I understand these instructions.  I will adhere to these goals & educational materials that have been provided to me after my discharge from the hospital.      My questions have been answered and I understand these instructions. I will adhere to these goals and the provided educational materials after my discharge from the  hospital.  Patient/Caregiver Signature _______________________________ Date __________  Clinician Signature _______________________________________ Date __________  Please bring this form and your medication list with you to all your follow-up doctor's appointments.

## 2022-10-10 NOTE — Evaluation (Signed)
Occupational Therapy Assessment and Plan  Patient Details  Name: Ricardo Yang MRN: [id number] Date of Birth: 10-Dec-1975  OT Diagnosis: hemiplegia affecting dominant side and muscle weakness (generalized) Rehab Potential: Rehab Potential (ACUTE ONLY): Good ELOS: 10-14 days   Today's Date: 10/10/2022 OT Individual Time: 1607-3710 OT Individual Time Calculation (min): 80 min     Hospital Problem: Principal Problem:   Left middle cerebral artery stroke [facility])   Past Medical History:  Past Medical History:  Diagnosis Date   Multiple sclerosis ([place])    a. question possible MS   Obesity    Polysubstance abuse ([place])    a. tobacco, cocaine, crack, ecstasy, pills, "anything but injections"   Past Surgical History:  Past Surgical History:  Procedure Laterality Date   CARDIAC CATHETERIZATION N/A 05/28/2015   Procedure: Left Heart Cath and Coronary Angiography;  Surgeon: Minna Merritts, MD;  Location: [place] [facility];  Service: Cardiovascular;  Laterality: N/A;   CORONARY ANGIOGRAPHY N/A 04/22/2021   Procedure: CORONARY ANGIOGRAPHY;  Surgeon: Isaias Cowman, MD;  Location: [place] [facility];  Service: Cardiovascular;  Laterality: N/A;   CORONARY/GRAFT ACUTE MI REVASCULARIZATION N/A 08/06/2019   Procedure: Coronary/Graft Acute MI Revascularization;  Surgeon: Isaias Cowman, MD;  Location: [facility] [address] [facility];  Service: Cardiovascular;  Laterality: N/A;   LEFT HEART CATH N/A 04/22/2021   Procedure: Left Heart Cath;  Surgeon: Isaias Cowman, MD;  Location: [place] [facility];  Service: Cardiovascular;  Laterality: N/A;   LEFT HEART CATH AND CORONARY ANGIOGRAPHY N/A 08/06/2019   Procedure: LEFT HEART CATH AND CORONARY ANGIOGRAPHY;  Surgeon: Isaias Cowman, MD;  Location: [place] [facility];  Service: Cardiovascular;  Laterality: N/A;   TEE WITHOUT CARDIOVERSION N/A 10/08/2022   Procedure: TRANSESOPHAGEAL ECHOCARDIOGRAM (TEE);  Surgeon: Kate Sable, MD;   Location: [facility];  Service: Cardiovascular;  Laterality: N/A;  11:30am    Assessment & Plan Clinical Impression: Ricardo Yang is a 46 year old right-handed male with history of reported CVA in the past with no deficits, CAD with anterior STEMI 07/2019, non-STEMI 03/2021/status post PCI with complications of ischemic cardiomyopathy ejection fraction 25 to 30% from an echocardiogram 6/23 maintained on aspirin and Plavix last seen by cardiology services [place] 2022, hypertension maintained on lisinopril 2.5 mg daily, Toprol XL 25 mg daily, tobacco use as well as polysubstance abuse, obesity with BMI 30.70.  Cocaine positive on presentation.  Per chart review patient lives with significant other.  1 level home with ramped entrance.  Reportedly independent prior to admission.  Presented 10/04/2022 with acute onset of aphasia and right-sided weakness and leftward gaze.  Cranial CT scan with no acute changes.  CT angiogram head and neck no large vessel occlusion.  No atherosclerosis, stenosis or arterial abnormality identified in the head or neck.  MRI showed acute infarcts in the left ACA, left MCA and likely of the right PCA territory with a large infarct involving the left postcentral gyrus.  Patient did not receive tPA.  Admission chemistries unremarkable except WBC 11,300, alcohol negative, urine drug screen positive for cocaine as well as marijuana, troponin 265-275.  Echocardiogram with ejection fraction less than 20%.  The left ventricle showed severely decreased function as well as global hypokinesis.  TEE completed by cardiology services showing severely reduced systolic function and no mural apical thrombus with estimated ejection fraction 20-25%.  Bubble study was negative.  No PFO noted.  Elevated troponin felt to be related to demand ischemia.  Patient remains on Plavix and aspirin as  prior to admission.  Permissive hypertension with low-dose lisinopril as well as Toprol resumed.  Tolerating a  regular consistency diet.  He reports his strength is gradually improving. Therapy evaluations completed due to patient's aphasia decreased functional mobility and right side weakness was admitted for a comprehensive rehab program.  Patient transferred to CIR on 10/09/2022 .    Patient currently requires mod with basic self-care skills secondary to muscle weakness, impaired timing and sequencing, abnormal tone, unbalanced muscle activation, and decreased coordination, decreased attention to right, and decreased standing balance, decreased postural control, hemiplegia, and decreased balance strategies.  Prior to hospitalization, patient could complete ADLs with independent .  Patient will benefit from skilled intervention to increase independence with basic self-care skills prior to discharge home with care partner.  Anticipate patient will require 24 hour supervision and follow up home health.  OT - End of Session Activity Tolerance: Tolerates 30+ min activity without fatigue Endurance Deficit: Yes Endurance Deficit Description: generalized weakness OT Assessment Rehab Potential (ACUTE ONLY): Good OT Barriers to Discharge: Inaccessible home environment OT Barriers to Discharge Comments: will move to new house after Dec 27 with 2 stairs OT Patient demonstrates impairments in the following area(s): Balance;Perception;Safety;Sensory;Endurance;Motor OT Basic ADL's Functional Problem(s): Bathing;Dressing;Toileting;Grooming;Eating OT Transfers Functional Problem(s): Toilet;Tub/Shower OT Additional Impairment(s): Fuctional Use of Upper Extremity OT Plan OT Intensity: Minimum of 1-2 x/day, 45 to 90 minutes OT Frequency: 5 out of 7 days OT Duration/Estimated Length of Stay: 10-14 days OT Treatment/Interventions: Balance/vestibular training;Discharge planning;Functional electrical stimulation;Pain management;Self Care/advanced ADL retraining;Therapeutic Activities;UE/LE Coordination activities;Cognitive  remediation/compensation;Functional mobility training;Skin care/wound managment;Patient/family education;Therapeutic Exercise;Community reintegration;DME/adaptive equipment instruction;Neuromuscular re-education;Psychosocial support;UE/LE Strength taining/ROM;Splinting/orthotics;Wheelchair propulsion/positioning;Disease mangement/prevention OT Self Feeding Anticipated Outcome(s): (S) OT Basic Self-Care Anticipated Outcome(s): (S) OT Toileting Anticipated Outcome(s): (S) OT Bathroom Transfers Anticipated Outcome(s): (S) OT Recommendation Recommendations for Other Services: Neuropsych consult Patient destination: Home Follow Up Recommendations: Home health OT Equipment Recommended: Tub/shower bench   OT Evaluation Precautions/Restrictions  Precautions Precautions: Fall Type of Shoulder Precautions: R hemi UE>LE Restrictions Weight Bearing Restrictions: No General Chart Reviewed: Yes Family/Caregiver Present: No Home Living/Prior Functioning Home Living Family/patient expects to be discharged to:: Private residence Living Arrangements: Spouse/significant other Available Help at Discharge: Family, Available 24 hours/day Type of Home: House Home Access: Stairs to enter [employer] of Steps: 2 Home Layout: One level Bathroom Shower/Tub: Optometrist: No  Lives With: Significant other IADL History Homemaking Responsibilities: Yes Meal Prep Responsibility: Secondary Laundry Responsibility: Secondary Cleaning Responsibility: Secondary Bill Paying/Finance Responsibility: No Shopping Responsibility: No Current License: Yes Mode of Transportation: Car Occupation: Unemployed Prior Function Level of Independence: Independent with basic ADLs, Independent with transfers, Independent with homemaking with ambulation, Independent with gait Driving: No Leisure: Hobbies-yes (Comment) Vision Baseline Vision/History: 1 Wears  glasses (reading) Ability to See in Adequate Light: 0 Adequate Patient Visual Report: No change from baseline Vision Assessment?: Yes Eye Alignment: Within Functional Limits Ocular Range of Motion: Within Functional Limits Alignment/Gaze Preference: Within Defined Limits Tracking/Visual Pursuits: Able to track stimulus in all quads without difficulty Saccades: Within functional limits Convergence: Within functional limits Perception  Perception: Impaired Inattention/Neglect: Does not attend to right side of body Praxis Praxis: Intact Cognition Cognition Overall Cognitive Status: Within Functional Limits for tasks assessed Arousal/Alertness: Awake/alert Orientation Level: Person;Place;Situation Person: Oriented Place: Oriented Situation: Oriented Memory: Appears intact Awareness: Appears intact Problem Solving: Appears intact Safety/Judgment: Appears intact Brief Interview for Mental Status (BIMS) Repetition of Three Words (First Attempt): 3 Temporal Orientation: Year: Correct Temporal  Orientation: Month: Accurate within 5 days Temporal Orientation: Day: Correct Recall: "Sock": Yes, no cue required Recall: "Blue": Yes, no cue required Recall: "Bed": Yes, no cue required BIMS Summary Score: 15 Sensation Sensation Light Touch: Impaired Detail Central sensation comments: Pain intact in RUE, light touch mostly absent Light Touch Impaired Details: Impaired RUE;Impaired RLE Coordination Gross Motor Movements are Fluid and Coordinated: No Fine Motor Movements are Fluid and Coordinated: No Coordination and Movement Description: R hemi Finger Nose Finger Test: WFL on the L Motor  Motor Motor - Skilled Clinical Observations: R hemi  Trunk/Postural Assessment  Cervical Assessment Cervical Assessment: Within Functional Limits Thoracic Assessment Thoracic Assessment: Within Functional Limits Lumbar Assessment Lumbar Assessment: Within Functional Limits Postural  Control Postural Control: Deficits on evaluation Righting Reactions: delayed and inadequate  Balance Balance Balance Assessed: Yes Static Sitting Balance Static Sitting - Balance Support: Feet supported Static Sitting - Level of Assistance: 6: Modified independent (Device/Increase time) Dynamic Sitting Balance Dynamic Sitting - Balance Support: Feet supported Dynamic Sitting - Level of Assistance: 5: Stand by assistance Static Standing Balance Static Standing - Balance Support: During functional activity;No upper extremity supported Static Standing - Level of Assistance: 3: Mod assist Dynamic Standing Balance Dynamic Standing - Balance Support: During functional activity Dynamic Standing - Level of Assistance: 3: Mod assist Extremity/Trunk Assessment RUE Assessment RUE Assessment: Exceptions to [facility] Strength Comments: Flexor synergy pattern. With great effort and concentration pt can achieve R shoulder flexion to 80 degrees, full elbow flexion, and 90% of finger flexion RUE Body System: Neuro Brunstrum levels for arm and hand: Arm;Hand Brunstrum level for arm: Stage III Synergy is performed voluntarily Brunstrum level for hand: Stage III Synergies performed voluntarily LUE Assessment LUE Assessment: Within Functional Limits  Care Tool Care Tool Self Care Eating   Eating Assist Level: Minimal Assistance - Patient > 75%    Oral Care    Oral Care Assist Level: Minimal Assistance - Patient > 75%    Bathing   Body parts bathed by patient: Right arm;Chest;Abdomen;Front perineal area;Buttocks;Right upper leg;Left upper leg;Right lower leg;Left lower leg;Face Body parts bathed by helper: Left arm   Assist Level: Moderate Assistance - Patient 50 - 74%    Upper Body Dressing(including orthotics)   What is the patient wearing?: Pull over shirt   Assist Level: Maximal Assistance - Patient 25 - 49%    Lower Body Dressing (excluding footwear)   What is the patient wearing?:  Pants Assist for lower body dressing: Moderate Assistance - Patient 50 - 74%    Putting on/Taking off footwear   What is the patient wearing?: Non-skid slipper socks Assist for footwear: Moderate Assistance - Patient 50 - 74%       Care Tool Toileting Toileting activity   Assist for toileting: Moderate Assistance - Patient 50 - 74%     Care Tool Bed Mobility Roll left and right activity   Roll left and right assist level: Contact Guard/Touching assist    Sit to lying activity   Sit to lying assist level: Contact Guard/Touching assist    Lying to sitting on side of bed activity   Lying to sitting on side of bed assist level: the ability to move from lying on the back to sitting on the side of the bed with no back support.: Contact Guard/Touching assist     Care Tool Transfers Sit to stand transfer   Sit to stand assist level: Moderate Assistance - Patient 50 - 74%    Chair/bed  transfer   Chair/bed transfer assist level: Moderate Assistance - Patient 50 - 74%     Toilet transfer   Assist Level: Moderate Assistance - Patient 50 - 74%     Care Tool Cognition  Expression of Ideas and Wants Expression of Ideas and Wants: 3. Some difficulty - exhibits some difficulty with expressing needs and ideas (e.g, some words or finishing thoughts) or speech is not clear  Understanding Verbal and Non-Verbal Content Understanding Verbal and Non-Verbal Content: 4. Understands (complex and basic) - clear comprehension without cues or repetitions   Memory/Recall Ability Memory/Recall Ability : Current season;Location of own room;That he or she is in a hospital/hospital unit;Staff names and faces   Refer to Care Plan for Long Term Goals  SHORT TERM GOAL WEEK 1 OT Short Term Goal 1 (Week 1): Pt will don shirt with min A OT Short Term Goal 2 (Week 1): Pt will complete LB dressing with min A OT Short Term Goal 3 (Week 1): Pt will complete toilet transfer with min A with LRAD  Recommendations  for other services: Neuropsych   Skilled Therapeutic Intervention ADL ADL Eating: Minimal assistance Where Assessed-Eating: Edge of bed Grooming: Minimal assistance Where Assessed-Grooming: Sitting at sink Upper Body Bathing: Moderate assistance Where Assessed-Upper Body Bathing: Shower Lower Body Bathing: Moderate assistance Where Assessed-Lower Body Bathing: Shower Upper Body Dressing: Maximal assistance;Moderate cueing Where Assessed-Upper Body Dressing: Edge of bed Lower Body Dressing: Moderate assistance Where Assessed-Lower Body Dressing: Edge of bed Toileting: Moderate assistance Where Assessed-Toileting: Glass blower/designer: Moderate assistance Toilet Transfer Method: Counselling psychologist: Energy manager: Moderate assistance Social research officer, government Method: Heritage manager: Manufacturing systems engineer  Bed Mobility Bed Mobility: Sit to Supine;Supine to Sit Supine to Sit: Contact Guard/Touching assist Sit to Supine: Contact Guard/Touching assist Transfers Sit to Stand: Moderate Assistance - Patient 50-74% Stand to Sit: Moderate Assistance - Patient 50-74%   Skilled OT evaluation completed with the creation of pt centered OT POC. Pt educated on condition, ELOS, rehab expectations, and fall risk reduction strategies throughout session. Ricardo is very motivated to return to [facility] and openly speaks about need to abstain from polysubstance abuse. He is currently limited by RUE>RLE hemiplegia with poor dynamic standing balance and trunk control dynamically. His RUE has strong flexor synergy pattern of movement. Edu provided on CVA recovery throughout session. Will initiate saebo use next session. Discussed holster heart monitor with PA and RN d/t it blinking red after shower and obvious poor adherence to skin. ADLs completed at shower level as described above. Pt was left sitting up in the recliner with all needs met, chair  alarm set, and call bell within reach.     Discharge Criteria: Patient will be discharged from OT if patient refuses treatment 3 consecutive times without medical reason, if treatment goals not met, if there is a change in medical status, if patient makes no progress towards goals or if patient is discharged from hospital.  The above assessment, treatment plan, treatment alternatives and goals were discussed and mutually agreed upon: by patient  [name] 10/10/2022, 9:05 AM

## 2022-10-10 NOTE — Progress Notes (Signed)
Patient ID: Ricardo Yang, male   DOB: [birth year], 46 y.o.   MRN: [id number] Met with the patient to review current situation, rehab process, team conference and plan of care. Discussed secondary risks including HTN, HF, CAD, and polysubstance abuse with smoking. Reviewed recorder management and need to recharge in 7 days and remove after 14 days; instructions at bedside. Reviewed medication and dietary modification recommendations. Patient noted no pain but insomnia. Also noted smoking cessation tips; no patch needed at present per patient. Continue to follow along to address educational needs to facilitate preparation for discharge. Margarito Liner

## 2022-10-10 NOTE — Evaluation (Signed)
Speech Language Pathology Assessment and Plan  Patient Details  Name: Ricardo Yang MRN: [id number] Date of Birth: 1976-06-30  SLP Diagnosis: Aphasia;Speech and Language deficits  Rehab Potential: Excellent ELOS: 10-14 days   Today's Date: 10/10/2022 SLP Individual Time: 5093-2671 SLP Individual Time Calculation (min): 27 min  Hospital Problem: Principal Problem:   Left middle cerebral artery stroke [facility])  Past Medical History:  Past Medical History:  Diagnosis Date   Multiple sclerosis ([place])    a. question possible MS   Obesity    Polysubstance abuse ([place])    a. tobacco, cocaine, crack, ecstasy, pills, "anything but injections"   Past Surgical History:  Past Surgical History:  Procedure Laterality Date   CARDIAC CATHETERIZATION N/A 05/28/2015   Procedure: Left Heart Cath and Coronary Angiography;  Surgeon: Minna Merritts, MD;  Location: [place] [facility];  Service: Cardiovascular;  Laterality: N/A;   CORONARY ANGIOGRAPHY N/A 04/22/2021   Procedure: CORONARY ANGIOGRAPHY;  Surgeon: Isaias Cowman, MD;  Location: [place] [facility];  Service: Cardiovascular;  Laterality: N/A;   CORONARY/GRAFT ACUTE MI REVASCULARIZATION N/A 08/06/2019   Procedure: Coronary/Graft Acute MI Revascularization;  Surgeon: Isaias Cowman, MD;  Location: [place] [facility];  Service: Cardiovascular;  Laterality: N/A;   LEFT HEART CATH N/A 04/22/2021   Procedure: Left Heart Cath;  Surgeon: Isaias Cowman, MD;  Location: [place] [facility];  Service: Cardiovascular;  Laterality: N/A;   LEFT HEART CATH AND CORONARY ANGIOGRAPHY N/A 08/06/2019   Procedure: LEFT HEART CATH AND CORONARY ANGIOGRAPHY;  Surgeon: Isaias Cowman, MD;  Location: [place] [facility];  Service: Cardiovascular;  Laterality: N/A;   TEE WITHOUT CARDIOVERSION N/A 10/08/2022   Procedure: TRANSESOPHAGEAL ECHOCARDIOGRAM (TEE);  Surgeon: Kate Sable, MD;  Location: [facility];  Service: Cardiovascular;   Laterality: N/A;  11:30am    Assessment / Plan / Recommendation Clinical Impression Ricardo Yang is a 46 year old right-handed male with history of reported CVA in the past with no deficits, CAD with anterior STEMI 07/2019, non-STEMI 03/2021/status post PCI. Per chart review patient lives with significant other.  1 level home with ramped entrance.  Reportedly independent prior to admission. Presented 10/04/2022 with acute onset of aphasia and right-sided weakness and leftward gaze. MRI showed acute infarcts in the left ACA, left MCA and likely of the right PCA territory with a large infarct involving the left postcentral gyrus. Urine drug screen positive for cocaine as well as marijuana. Tolerating a regular consistency diet. He reports his strength is gradually improving. Therapy evaluations completed due to patient's aphasia decreased functional mobility and right side weakness was admitted for a comprehensive rehab program.   SLP was consulted to complete speech/language evaluation in the setting of recent left MCA stroke. Pt presents with mild expressive language deficits characterized by intermittent word finding difficulty at the conversational level. Pt excelled with structured language tasks with minimal evidence of anomia. Pt also demonstrated s/sx suggestive of apraxia as evidenced by inconsistent speech sound errors and hesitation. Pt was aware of this and compensated well by intentionally reducing speech rate at the supervision level. Comprehension skills appeared grossly intact for simple and complex auditory information. Reading skills appeared intact at the paragraph level. Pt exhibited mild-to-moderately impaired writing characterized by hesitation and support needed for accurate spelling at the word level. Writing efficiency impacted by using non-dominant hand for writing tasks. Cognition appeared [facility] through informal assessment for all tasks assessed. SLP administered the [place] (MAST) with the following results:  -  Naming: 10/10 - Automatic Speech: 10/10 - Repetition: 9/10 - Yes/No responses: 10/10 - Following Instructions: 10/10  Skilled SLP services are recommended to maximize communication effectiveness and efficiency. Pt verbalized understanding and agreement with plan.    Skilled Therapeutic Interventions          Speech/language/cognitive evaluations completed. Please see report for full details  SLP Assessment  Patient will need skilled Speech Lanaguage Pathology Services during CIR admission    Recommendations  SLP Diet Recommendations: Age appropriate regular solids;Thin Oral Care Recommendations: Oral care QID Patient destination: Home Follow up Recommendations: Other (comment) (TBD) Equipment Recommended: None recommended by SLP    SLP Frequency 1 to 3 out of 7 days   SLP Duration  SLP Intensity  SLP Treatment/Interventions 10-14 days  Minumum of 1-2 x/day, 30 to 90 minutes  Speech/Language facilitation;Functional tasks;Therapeutic Activities;Patient/family education    Pain    Prior Functioning Type of Home: House  Lives With: Significant other;Other (Comment) (Fiance) Available Help at Discharge: Family;Available 24 hours/day Vocation: Part time employment  SLP Evaluation Cognition Overall Cognitive Status: Within Functional Limits for tasks assessed Arousal/Alertness: Awake/alert Orientation Level: Oriented X4 Year: 2023 Month: December Day of Week: Correct Attention: Focused;Sustained;Selective Focused Attention: Appears intact Sustained Attention: Appears intact Selective Attention: Appears intact Memory: Appears intact Awareness: Appears intact Problem Solving: Appears intact Safety/Judgment: Appears intact  Comprehension Auditory Comprehension Overall Auditory Comprehension: Appears within functional limits for tasks assessed Yes/No Questions: Within Functional Limits Commands: Within Functional  Limits Conversation: Complex Reading Comprehension Reading Status: Within funtional limits Expression Expression Primary Mode of Expression: Verbal Verbal Expression Overall Verbal Expression: Impaired Initiation: No impairment Automatic Speech: Name;Social Response;Counting;Day of week;Month of year (WFL) Level of Generative/Spontaneous Verbalization: Sentence Repetition: Impaired Level of Impairment: Sentence level Naming:  (additional time) Pragmatics: No impairment Written Expression Dominant Hand: Right Written Expression: Exceptions to [facility] Self Formulation Ability: Letter Oral Motor Oral Motor/Sensory Function Overall Oral Motor/Sensory Function: Mild impairment Facial ROM: Reduced right Facial Symmetry: Abnormal symmetry right Facial Strength: Reduced right Facial Sensation: Within Functional Limits Lingual ROM: Within Functional Limits Lingual Symmetry: Within Functional Limits Lingual Strength: Within Functional Limits Lingual Sensation: Within Functional Limits Velum: Within Functional Limits Mandible: Within Functional Limits Motor Speech Overall Motor Speech: Impaired Respiration: Within functional limits Phonation: Normal Resonance: Within functional limits Articulation: Impaired Level of Impairment: Word Intelligibility: Intelligible Motor Planning: Impaired Level of Impairment: Sentence Motor Speech Errors: Inconsistent;Groping for words;Aware Effective Techniques: Slow rate  Care Tool Care Tool Cognition Ability to hear (with hearing aid or hearing appliances if normally used Ability to hear (with hearing aid or hearing appliances if normally used): 0. Adequate - no difficulty in normal conservation, social interaction, listening to TV   Expression of Ideas and Wants Expression of Ideas and Wants: 3. Some difficulty - exhibits some difficulty with expressing needs and ideas (e.g, some words or finishing thoughts) or speech is not clear    Understanding Verbal and Non-Verbal Content Understanding Verbal and Non-Verbal Content: 4. Understands (complex and basic) - clear comprehension without cues or repetitions  Memory/Recall Ability Memory/Recall Ability : Current season;That he or she is in a hospital/hospital unit   Intelligibility: Intelligible  Short Term Goals: Week 1: SLP Short Term Goal 1 (Week 1): Pt will demonstrate use of word finding strategies in 75% of opportunities during 10-15 minute conversational exchange with SLP, given sup A verbal cues SLP Short Term Goal 2 (Week 1): Pt will self monitor and repair communication breakdowns attributed to reduced articulatory  precision by implementing speech strategies (slow down, repeat) at the mod I level SLP Short Term Goal 3 (Week 1): Pt will write functional information at the word and short-sentence level with 80% accuracy with sup A verbal cues  Refer to Care Plan for Long Term Goals  Recommendations for other services: None   Discharge Criteria: Patient will be discharged from SLP if patient refuses treatment 3 consecutive times without medical reason, if treatment goals not met, if there is a change in medical status, if patient makes no progress towards goals or if patient is discharged from hospital.  The above assessment, treatment plan, treatment alternatives and goals were discussed and mutually agreed upon: by patient  Patty Sermons 10/10/2022, 5:01 PM

## 2022-10-10 NOTE — Progress Notes (Signed)
Inpatient Rehabilitation Care Coordinator Assessment and Plan Patient Details  Name: Ricardo Yang MRN: [id number] Date of Birth: [birth year]  Today's Date: 10/10/2022  Hospital Problems: Principal Problem:   Left middle cerebral artery stroke [facility])  Past Medical History:  Past Medical History:  Diagnosis Date   Multiple sclerosis ([facility])    a. question possible MS   Obesity    Polysubstance abuse ([facility])    a. tobacco, cocaine, crack, ecstasy, pills, "anything but injections"   Past Surgical History:  Past Surgical History:  Procedure Laterality Date   CARDIAC CATHETERIZATION N/A 05/28/2015   Procedure: Left Heart Cath and Coronary Angiography;  Surgeon: Antonieta Iba, MD;  Location: [facility];  Service: Cardiovascular;  Laterality: N/A;   CORONARY ANGIOGRAPHY N/A 04/22/2021   Procedure: CORONARY ANGIOGRAPHY;  Surgeon: Marcina Millard, MD;  Location: [facility];  Service: Cardiovascular;  Laterality: N/A;   CORONARY/GRAFT ACUTE MI REVASCULARIZATION N/A 08/06/2019   Procedure: Coronary/Graft Acute MI Revascularization;  Surgeon: Marcina Millard, MD;  Location: [facility];  Service: Cardiovascular;  Laterality: N/A;   LEFT HEART CATH N/A 04/22/2021   Procedure: Left Heart Cath;  Surgeon: Marcina Millard, MD;  Location: [facility];  Service: Cardiovascular;  Laterality: N/A;   LEFT HEART CATH AND CORONARY ANGIOGRAPHY N/A 08/06/2019   Procedure: LEFT HEART CATH AND CORONARY ANGIOGRAPHY;  Surgeon: Marcina Millard, MD;  Location: [facility];  Service: Cardiovascular;  Laterality: N/A;   TEE WITHOUT CARDIOVERSION N/A 10/08/2022   Procedure: TRANSESOPHAGEAL ECHOCARDIOGRAM (TEE);  Surgeon: Debbe Odea, MD;  Location: [facility];  Service: Cardiovascular;  Laterality: N/A;  11:30am   Social History:  reports that he has been smoking. He has never used smokeless tobacco. He reports current drug use. Frequency: 1.00 time  per week. Drugs: "Crack" cocaine, Amphetamines, Cocaine, Hashish, Marijuana, and MDMA (Ecstacy). He reports that he does not drink alcohol.  Family / Support Systems Marital Status: Single Patient Roles: Partner, Parent, Other (Comment) (son) Spouse/Significant Other: Mikki-girlfriend [phone] Children: two grown children Other Supports: Mom who will be assisting at discharge if needed Anticipated Caregiver: mikki and pt's mom Ability/Limitations of Caregiver: no limitations Caregiver Availability: 24/7 Family Dynamics: Pt is close with girlfriend of 13 months and Mom. He feels he has good supports and feels he has friends along with family supports  Social History Preferred language: English Religion: None Cultural Background: No issues Education: HS Health Literacy - How often do you need to have someone help you when you read instructions, pamphlets, or other written material from your doctor or pharmacy?: Never Writes: Yes Employment Status: Employed Name of Employer: side jobs Return to Work Plans: pt plans on not sure what he does did not share this Marine scientist Issues: No issues Guardian/Conservator: None-according to MD pt is not fully capable of making his own decisions, will look toward his family if any need to be made while here   Abuse/Neglect Abuse/Neglect Assessment Can Be Completed: Yes Physical Abuse: Denies Verbal Abuse: Denies Sexual Abuse: Denies Exploitation of patient/patient's resources: Denies Self-Neglect: Denies  Patient response to: Social Isolation - How often do you feel lonely or isolated from those around you?: Never  Emotional Status Pt's affect, behavior and adjustment status: Pt is motivated to get his leg and arm working. He has always been independent and taken care of himself he tends to do whatever he wants and realizes some of his habits are not good for him. Recent Psychosocial Issues:  other health issues and is  non-compliant with his health Psychiatric History: No history will aks neuro-psych to see with his substance abuse issues. Substance Abuse History: Pt admits to cocaine, tobacco and other drugs just doesn;t inject them. Discussed resources and he is aware of his options  Patient / Family Perceptions, Expectations & Goals Pt/Family understanding of illness & functional limitations: Pt can explain his stroke and deficits he feels his arm is worse then his leg and hopes to to better with this since he is right handed. He has spoken with the MD and feels he understands his treatment plan going forward. Premorbid pt/family roles/activities: boyfriend, father, son, friend, etc Anticipated changes in roles/activities/participation: resume Pt/family expectations/goals: Pt states: " I know I need to change my ways and will try too."  [employer]: None Premorbid Home Care/DME Agencies: None Transportation available at discharge: family and friends Is the patient able to respond to transportation needs?: Yes In the past 12 months, has lack of transportation kept you from medical appointments or from getting medications?: No In the past 12 months, has lack of transportation kept you from meetings, work, or from getting things needed for daily living?: No Resource referrals recommended: Neuropsychology  Discharge Planning Living Arrangements: Spouse/significant other Support Systems: Spouse/significant other, Children, Parent, Other relatives, Friends/neighbors Type of Residence: Private residence Insurance Resources: Customer service manager Resources: Family Support, Other (Comment) (side job) Surveyor, quantity Screen Referred: Yes Living Expenses: Psychologist, sport and exercise Management: Patient, Significant Other Does the patient have any problems obtaining your medications?: Yes (Describe) (doesnt go to a MD) Home Management: girlfriend Patient/Family Preliminary Plans: Return home with girlfreind  and his Mom to assist if girlfriend not able to. It was not clear if she works or not. Will reach out to girlfriend and confirm care at discharge. Pt seems to do what he wants to do although not always uses the best judgement with his decisions. Care Coordinator Barriers to Discharge: Insurance for SNF coverage Care Coordinator Anticipated Follow Up Needs: HH/OP  Clinical Impression Pleasant gentleman who is here for a CVA. Will work on getting him a PCP and see if eligible for SSD/SSI and medicaid. His girlfriend and Mom are supportive and will assist at discharge. Will ask neuro-psych to see regarding substance abuse issues. Pt is aware his decisions are poor but continues to make them.   Lucy Chris 10/10/2022, 9:38 AM

## 2022-10-10 NOTE — Plan of Care (Signed)
  Problem: Sit to Stand Goal: LTG:  Patient will perform sit to stand with assistance level (PT) Description: LTG:  Patient will perform sit to stand with assistance level (PT) Flowsheets (Taken 10/10/2022 1404) LTG: PT will perform sit to stand in preparation for functional mobility with assistance level: Independent with assistive device   Problem: RH Bed Mobility Goal: LTG Patient will perform bed mobility with assist (PT) Description: LTG: Patient will perform bed mobility with assistance, with/without cues (PT). Flowsheets (Taken 10/10/2022 1404) LTG: Pt will perform bed mobility with assistance level of: Independent with assistive device    Problem: RH Bed to Chair Transfers Goal: LTG Patient will perform bed/chair transfers w/assist (PT) Description: LTG: Patient will perform bed to chair transfers with assistance (PT). Flowsheets (Taken 10/10/2022 1404) LTG: Pt will perform Bed to Chair Transfers with assistance level: Independent with assistive device    Problem: RH Car Transfers Goal: LTG Patient will perform car transfers with assist (PT) Description: LTG: Patient will perform car transfers with assistance (PT). Flowsheets (Taken 10/10/2022 1404) LTG: Pt will perform car transfers with assist:: Independent with assistive device    Problem: RH Floor Transfers Goal: LTG Patient will perform floor transfers w/assist (PT) Description: LTG: Patient will perform floor transfers with assistance (PT). Flowsheets (Taken 10/10/2022 1404) LTG: PT WILL PERFORM FLOOR TRANFERS  WITH  ASSIST:: Supervision/Verbal cueing   Problem: RH Ambulation Goal: LTG Patient will ambulate in home environment (PT) Description: LTG: Patient will ambulate in home environment, # of feet with assistance (PT). Flowsheets (Taken 10/10/2022 1404) LTG: Pt will ambulate in home environ  assist needed:: Independent with assistive device LTG: Ambulation distance in home environment: 50' Goal: LTG Patient will  ambulate in community environment (PT) Description: LTG: Patient will ambulate in community environment, # of feet with assistance (PT). Flowsheets (Taken 10/10/2022 1404) LTG: Pt will ambulate in community environ  assist needed:: Supervision/Verbal cueing LTG: Ambulation distance in community environment: 150'   Problem: RH Wheelchair Mobility Goal: LTG Patient will propel w/c in community environment (PT) Description: LTG: Patient will propel wheelchair in community environment, # of feet with assist (PT) Flowsheets (Taken 10/10/2022 1404) LTG: Pt will propel w/c in community environ  assist needed:: Independent with assistive device Distance: wheelchair distance in controlled environment: 150   Problem: RH Stairs Goal: LTG Patient will ambulate up and down stairs w/assist (PT) Description: LTG: Patient will ambulate up and down # of stairs with assistance (PT) Flowsheets (Taken 10/10/2022 1404) LTG: Pt will ambulate up/down stairs assist needed:: Supervision/Verbal cueing LTG: Pt will  ambulate up and down number of stairs: 12

## 2022-10-10 NOTE — Progress Notes (Signed)
Inpatient Rehabilitation Center Individual Statement of Services  Patient Name:  Ricardo Yang  Date:  10/10/2022  Welcome to the Inpatient Rehabilitation Center.  Our goal is to provide you with an individualized program based on your diagnosis and situation, designed to meet your specific needs.  With this comprehensive rehabilitation program, you will be expected to participate in at least 3 hours of rehabilitation therapies Monday-Friday, with modified therapy programming on the weekends.  Your rehabilitation program will include the following services:  Physical Therapy (PT), Occupational Therapy (OT), Speech Therapy (ST), 24 hour per day rehabilitation nursing, Neuropsychology, Care Coordinator, Rehabilitation Medicine, Nutrition Services, and Pharmacy Services  Weekly team conferences will be held on Wednesday to discuss your progress.  Your Inpatient Rehabilitation Care Coordinator will talk with you frequently to get your input and to update you on team discussions.  Team conferences with you and your family in attendance may also be held.  Expected length of stay: 10-14 days  Overall anticipated outcome: supervision level with cues-OT goals and mod/I level-PT goals  Depending on your progress and recovery, your program may change. Your Inpatient Rehabilitation Care Coordinator will coordinate services and will keep you informed of any changes. Your Inpatient Rehabilitation Care Coordinator's name and contact numbers are listed  below.  The following services may also be recommended but are not provided by the Inpatient Rehabilitation Center:   Home Health Rehabiltiation Services Outpatient Rehabilitation Services    Arrangements will be made to provide these services after discharge if needed.  Arrangements include referral to agencies that provide these services.  Your insurance has been verified to be:  None Your primary doctor is:  None  Pertinent information will be shared  with your doctor and your insurance company.  Inpatient Rehabilitation Care Coordinator:  Dossie Der, [place] [phone] or [place]  Information discussed with and copy given to patient by: Lucy Chris, 10/10/2022, 9:40 AM

## 2022-10-10 NOTE — Evaluation (Signed)
Physical Therapy Assessment and Plan  Patient Details  Name: Ricardo Yang MRN: [id number] Date of Birth: Jul 09, 1976  PT Diagnosis: Abnormality of gait, Coordination disorder, Difficulty walking, and Hemiplegia dominant Rehab Potential: Good ELOS: 10-14   Today's Date: 10/10/2022 PT Individual Time: 1300-1415 PT Individual Time Calculation (min): 75 min    Hospital Problem: Principal Problem:   Left middle cerebral artery stroke [facility])   Past Medical History:  Past Medical History:  Diagnosis Date   Multiple sclerosis ([place])    a. question possible MS   Obesity    Polysubstance abuse ([place])    a. tobacco, cocaine, crack, ecstasy, pills, "anything but injections"   Past Surgical History:  Past Surgical History:  Procedure Laterality Date   CARDIAC CATHETERIZATION N/A 05/28/2015   Procedure: Left Heart Cath and Coronary Angiography;  Surgeon: Minna Merritts, MD;  Location: [place] [facility];  Service: Cardiovascular;  Laterality: N/A;   CORONARY ANGIOGRAPHY N/A 04/22/2021   Procedure: CORONARY ANGIOGRAPHY;  Surgeon: Isaias Cowman, MD;  Location: [place] [facility];  Service: Cardiovascular;  Laterality: N/A;   CORONARY/GRAFT ACUTE MI REVASCULARIZATION N/A 08/06/2019   Procedure: Coronary/Graft Acute MI Revascularization;  Surgeon: Isaias Cowman, MD;  Location: [place] [facility];  Service: Cardiovascular;  Laterality: N/A;   LEFT HEART CATH N/A 04/22/2021   Procedure: Left Heart Cath;  Surgeon: Isaias Cowman, MD;  Location: [place] [facility];  Service: Cardiovascular;  Laterality: N/A;   LEFT HEART CATH AND CORONARY ANGIOGRAPHY N/A 08/06/2019   Procedure: LEFT HEART CATH AND CORONARY ANGIOGRAPHY;  Surgeon: Isaias Cowman, MD;  Location: [place] [facility];  Service: Cardiovascular;  Laterality: N/A;   TEE WITHOUT CARDIOVERSION N/A 10/08/2022   Procedure: TRANSESOPHAGEAL ECHOCARDIOGRAM (TEE);  Surgeon: Kate Sable, MD;  Location: [facility];   Service: Cardiovascular;  Laterality: N/A;  11:30am    Assessment & Plan Clinical Impression: Patient is a 46 year old right-handed male with history of reported CVA in the past with no deficits, CAD with anterior STEMI 07/2019, non-STEMI 03/2021/status post PCI with complications of ischemic cardiomyopathy ejection fraction 25 to 30% from an echocardiogram 6/23 maintained on aspirin and Plavix last seen by cardiology services [place] 2022, hypertension maintained on lisinopril 2.5 mg daily, Toprol XL 25 mg daily, tobacco use as well as polysubstance abuse, obesity with BMI 30.70.  Cocaine positive on presentation.  Per chart review patient lives with significant other.  1 level home with ramped entrance.  Reportedly independent prior to admission.  Presented 10/04/2022 with acute onset of aphasia and right-sided weakness and leftward gaze.  Cranial CT scan with no acute changes.  CT angiogram head and neck no large vessel occlusion.  No atherosclerosis, stenosis or arterial abnormality identified in the head or neck.  MRI showed acute infarcts in the left ACA, left MCA and likely of the right PCA territory with a large infarct involving the left postcentral gyrus.  Patient did not receive tPA.  Admission chemistries unremarkable except WBC 11,300, alcohol negative, urine drug screen positive for cocaine as well as marijuana, troponin 265-275.  Echocardiogram with ejection fraction less than 20%.  The left ventricle showed severely decreased function as well as global hypokinesis.  TEE completed by cardiology services showing severely reduced systolic function and no mural apical thrombus with estimated ejection fraction 20-25%.  Bubble study was negative.  No PFO noted.  Elevated troponin felt to be related to demand ischemia.  Patient remains on Plavix and aspirin as prior to admission.  Permissive hypertension  with low-dose lisinopril as well as Toprol resumed.  Tolerating a regular consistency diet.  He  reports his strength is gradually improving. Therapy evaluations completed due to patient's aphasia decreased functional mobility and right side weakness was admitted for a comprehensive rehab program.   Patient currently requires min with mobility secondary to muscle weakness, decreased cardiorespiratoy endurance, decreased coordination, decreased attention to right and decreased motor planning, and decreased standing balance, hemiplegia, and decreased balance strategies.  Prior to hospitalization, patient was independent  with mobility and lived with Significant other, Other (Comment) (Fiance) in a House home.  Home access is 2Ramped entrance.  Patient will benefit from skilled PT intervention to maximize safe functional mobility, minimize fall risk, and decrease caregiver burden for planned discharge home with intermittent assist.  Anticipate patient will benefit from follow up OP at discharge.  PT - End of Session Activity Tolerance: Tolerates 30+ min activity with multiple rests Endurance Deficit: Yes Endurance Deficit Description: Generalized weakness PT Assessment Rehab Potential (ACUTE/IP ONLY): Good PT Barriers to Discharge: Insurance for SNF coverage PT Patient demonstrates impairments in the following area(s): Balance;Perception;Safety;Sensory;Endurance;Motor PT Transfers Functional Problem(s): Bed Mobility;Bed to Chair;Car;Furniture PT Locomotion Functional Problem(s): Ambulation;Wheelchair Mobility;Stairs PT Plan PT Intensity: Minimum of 1-2 x/day ,45 to 90 minutes PT Frequency: 5 out of 7 days PT Duration Estimated Length of Stay: 10-14 PT Treatment/Interventions: Ambulation/gait training;Community reintegration;DME/adaptive equipment instruction;Neuromuscular re-education;Psychosocial support;Stair training;UE/LE Strength taining/ROM;Wheelchair propulsion/positioning;Balance/vestibular training;Discharge planning;Functional electrical stimulation;Pain management;Skin care/wound  management;Therapeutic Activities;UE/LE Coordination activities;Cognitive remediation/compensation;Disease management/prevention;Functional mobility training;Patient/family education;Splinting/orthotics;Therapeutic Exercise;Visual/perceptual remediation/compensation PT Transfers Anticipated Outcome(s): ModI with LRAD PT Locomotion Anticipated Outcome(s): ModI with LRAD PT Recommendation Recommendations for Other Services: Neuropsych consult;Therapeutic Recreation consult Therapeutic Recreation Interventions: Stress management;Pet therapy Follow Up Recommendations: Outpatient PT Patient destination: Home Equipment Recommended: To be determined Equipment Details: TBD- Patient owns a walker, SPC (hurricane), crutches   PT Evaluation Precautions/Restrictions Precautions Precautions: Fall Type of Shoulder Precautions: R hemi UE>LE Pain Interference Pain Interference Pain Effect on Sleep: 1. Rarely or not at all Pain Interference with Therapy Activities: 1. Rarely or not at all Pain Interference with Day-to-Day Activities: 1. Rarely or not at all Home Living/Prior [place] Available Help at Discharge: Family;Available 24 hours/day Type of Home: House Home Access: Ramped entrance Home Layout: One level Bathroom Shower/Tub: Chiropodist: Standard Bathroom Accessibility: Yes Additional Comments: Patient inherited the home form his father and the house was made handi-cap accessible for his step-mom; Grab bars in bathroom and shower  Lives With: Significant other;Other (Comment) (Fiance) Prior Function Level of Independence: Independent with basic ADLs;Independent with transfers;Independent with homemaking with ambulation;Independent with gait  Able to Take Stairs?: Yes Driving: Yes Vocation: Part time employment Vocation Requirements: Horticulturist, 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 Leisure: Hobbies-yes (Comment) ("Talk shit" Patient reports he enjoys  socializing with his girlfriend and play video games) Vision/Perception  Vision - History Ability to See in Adequate Light: 0 Adequate Perception Perception: Impaired Inattention/Neglect: Does not attend to right side of body Praxis Praxis: Intact  Cognition Overall Cognitive Status: Within Functional Limits for tasks assessed Arousal/Alertness: Awake/alert Orientation Level: Oriented X4 Year: 2023 Month: December Day of Week: Correct Memory: Appears intact Awareness: Appears intact Problem Solving: Appears intact Safety/Judgment: Appears intact Sensation Sensation Light Touch: Impaired Detail Central sensation comments: Pain intact in RUE, light touch mostly absent- Patient reports R UE/LE dull compared to L UE/LE Light Touch Impaired Details: Impaired RUE;Impaired RLE Coordination Gross Motor Movements are Fluid and Coordinated: No Fine Motor  Movements are Fluid and Coordinated: No Coordination and Movement Description: R hemi Motor  Motor Motor: Hemiplegia Motor - Skilled Clinical Observations: R hemi   Trunk/Postural Assessment  Cervical Assessment Cervical Assessment: Within Functional Limits Thoracic Assessment Thoracic Assessment: Within Functional Limits Lumbar Assessment Lumbar Assessment: Within Functional Limits Postural Control Postural Control: Deficits on evaluation Righting Reactions: Delayed and inadequate  Balance Balance Balance Assessed: Yes Static Sitting Balance Static Sitting - Balance Support: Feet supported Static Sitting - Level of Assistance: 6: Modified independent (Device/Increase time) Dynamic Sitting Balance Dynamic Sitting - Balance Support: Feet supported Dynamic Sitting - Level of Assistance: 5: Stand by assistance Static Standing Balance Static Standing - Balance Support: During functional activity;No upper extremity supported Static Standing - Level of Assistance: 4: Min assist Dynamic Standing Balance Dynamic Standing -  Balance Support: During functional activity;Right upper extremity supported Dynamic Standing - Level of Assistance: 4: Min assist Extremity Assessment      RLE Assessment RLE Assessment: Within Functional Limits General Strength Comments: Gross strength 4/5 LLE Assessment LLE Assessment: Within Functional Limits General Strength Comments: Grossly 5/5 for all functional mobility  Care Tool Care Tool Bed Mobility Roll left and right activity   Roll left and right assist level: Contact Guard/Touching assist    Sit to lying activity   Sit to lying assist level: Contact Guard/Touching assist    Lying to sitting on side of bed activity   Lying to sitting on side of bed assist level: the ability to move from lying on the back to sitting on the side of the bed with no back support.: Contact Guard/Touching assist     Care Tool Transfers Sit to stand transfer   Sit to stand assist level: Minimal Assistance - Patient > 75%    Chair/bed transfer   Chair/bed transfer assist level: Minimal Assistance - Patient > 75%     Physiological scientist transfer assist level: Minimal Assistance - Patient > 75%      Care Tool Locomotion Ambulation   Assist level: Minimal Assistance - Patient > 75% Assistive device: Hand held assist Max distance: 180'  Walk 10 feet activity   Assist level: Minimal Assistance - Patient > 75% Assistive device: Hand held assist   Walk 50 feet with 2 turns activity   Assist level: Minimal Assistance - Patient > 75% Assistive device: Hand held assist  Walk 150 feet activity   Assist level: Minimal Assistance - Patient > 75% Assistive device: Hand held assist  Walk 10 feet on uneven surfaces activity   Assist level: Minimal Assistance - Patient > 75% Assistive device: Hand held assist  Stairs   Assist level: Minimal Assistance - Patient > 75% Stairs assistive device: 1 hand rail Max number of stairs: 12  Walk up/down 1 step activity    Walk up/down 1 step (curb) assist level: Minimal Assistance - Patient > 75% Walk up/down 1 step or curb assistive device: 1 hand rail  Walk up/down 4 steps activity   Walk up/down 4 steps assist level: Minimal Assistance - Patient > 75% Walk up/down 4 steps assistive device: 1 hand rail  Walk up/down 12 steps activity   Walk up/down 12 steps assist level: Minimal Assistance - Patient > 75% Walk up/down 12 steps assistive device: 1 hand rail  Pick up small objects from floor   Pick up small object from the floor assist level: Minimal Assistance - Patient > 75% Pick up  small object from the floor assistive device: No AD  Wheelchair Is the patient using a wheelchair?: Yes Type of Wheelchair: Manual   Wheelchair assist level: Supervision/Verbal cueing Max wheelchair distance: >200'  Wheel 50 feet with 2 turns activity   Assist Level: Supervision/Verbal cueing  Wheel 150 feet activity   Assist Level: Supervision/Verbal cueing    Refer to Care Plan for Long Term Goals  SHORT TERM GOAL WEEK 1 PT Short Term Goal 1 (Week 1): Patient will ambulate >28' with LRAD and CGA PT Short Term Goal 2 (Week 1): Patient will ascend/descend 12 steps with L HR and CGA PT Short Term Goal 3 (Week 1): Patient will performed stand pivot transfer with LRAD and CGA  Recommendations for other services: Neuropsych and Therapeutic Recreation  Stress management  Skilled Therapeutic Intervention Mobility Bed Mobility Bed Mobility: Rolling Right;Rolling Left;Supine to Sit;Sit to Supine Rolling Right: Contact Guard/Touching assist Rolling Left: Contact Guard/Touching assist Supine to Sit: Contact Guard/Touching assist Sit to Supine: Contact Guard/Touching assist Transfers Transfers: Sit to Stand;Stand to Sit;Stand Pivot Transfers Sit to Stand: Minimal Assistance - Patient > 75% Stand to Sit: Minimal Assistance - Patient > 75% Stand Pivot Transfers: Minimal Assistance - Patient > 75% Stand Pivot Transfer  Details: Tactile cues for posture;Verbal cues for technique Transfer (Assistive device): 1 person hand held assist Locomotion  Gait Ambulation: Yes Gait Assistance: Minimal Assistance - Patient > 75% Gait Distance (Feet): 180 Feet Assistive device: 1 person hand held assist Gait Assistance Details: Verbal cues for technique;Verbal cues for gait pattern;Verbal cues for precautions/safety;Manual facilitation for weight shifting Gait Gait: Yes Gait Pattern: Decreased stance time - right;Decreased step length - left;Decreased stride length;Poor foot clearance - left Gait velocity: Decreased Stairs / Additional Locomotion Stairs: Yes Stairs Assistance: Minimal Assistance - Patient > 75% Stair Management Technique: One rail Left Number of Stairs: 12 Height of Stairs: 6 Ramp: Minimal Assistance - Patient >75% Curb: Minimal Assistance - Patient >75% Wheelchair Mobility Wheelchair Mobility: Yes Wheelchair Assistance: Chartered loss adjuster: Both lower extermities;Left upper extremity Wheelchair Parts Management: Needs assistance Distance: >68'  Skilled Intervention- Patient greeted sitting upright in bedside recliner with fiance and mother present and agreeable to PT treatment session. Patient performed all functional mobility without the use of an AD and R HHA with MinA for stability/safety. Patient demonstrated mild R knee buckling, however never lost his balance or had full knee buckling when ambulating with therapist. Patient returned to his room and left sitting upright in bedside recliner with posey belt on, call bell within reach and all needs met.    Discharge Criteria: Patient will be discharged from PT if patient refuses treatment 3 consecutive times without medical reason, if treatment goals not met, if there is a change in medical status, if patient makes no progress towards goals or if patient is discharged from hospital.  The above assessment,  treatment plan, treatment alternatives and goals were discussed and mutually agreed upon: by patient  [place] 10/10/2022, 3:18 PM

## 2022-10-10 NOTE — Progress Notes (Signed)
PROGRESS NOTE   Subjective/Complaints: Reports insomnia last night. No new concerns or complaints.     Review of Systems  Constitutional:  Negative for chills and fever.  HENT:  Negative for congestion.   Eyes:  Negative for double vision.  Respiratory:  Negative for cough.   Cardiovascular:  Negative for chest pain.  Gastrointestinal:  Negative for abdominal pain, nausea and vomiting.  Genitourinary: Negative.   Neurological:  Positive for sensory change and focal weakness. Negative for weakness.  Psychiatric/Behavioral:  The patient has insomnia.     Objective:   ECHO TEE  Result Date: 10/08/2022    TRANSESOPHOGEAL ECHO REPORT   Patient Name:   Ricardo Yang Date of Exam: 10/08/2022 Medical Rec #:  [id number]     Height:       75.0 in Accession #:    [id number]    Weight:       245.6 lb Date of Birth:  August 26, 1976      BSA:          2.395 m Patient Age:    46 years      BP:           132/96 mmHg Patient Gender: M             HR:           81 bpm. Exam Location:  [facility] Procedure: Transesophageal Echo, Cardiac Doppler and Color Doppler Indications:     Cerebral Infarction, unspecified I63.9  History:         Patient has prior history of Echocardiogram examinations, most                  recent 10/05/2022. CAD and Previous Myocardial Infarction.                  Polysubstance abuse.  Sonographer:     Leta Jungling RDCS Referring Phys:  [id number] Debbe Odea Diagnosing Phys: Debbe Odea MD PROCEDURE: After discussion of the risks and benefits of a TEE, an informed consent was obtained from the patient. TEE procedure time was 10 minutes. The transesophogeal probe was passed without difficulty through the esophogus of the patient. Imaged were obtained with the patient in a left lateral decubitus position. Sedation performed by performing physician. Patients was under conscious sedation during this procedure. Anesthetic administered:  of Fentanyl, 1.0mg  of Versed. The patient developed no complications during the procedure. Transgastric views not obtained.  IMPRESSIONS  1. Left ventricular ejection fraction, by estimation, is 20 to 25%. The left ventricle has severely decreased function. The left ventricle demonstrates global hypokinesis. The left ventricular internal cavity size was mildly to moderately dilated.  2. Right ventricular systolic function is severely reduced. The right ventricular size is normal.  3. No left atrial/left atrial appendage thrombus was detected. The LAA emptying velocity was 32 cm/s.  4. The mitral valve is normal in structure. Mild mitral valve regurgitation.  5. The aortic valve is tricuspid. Aortic valve regurgitation is trivial.  6. Agitated saline contrast bubble study was negative, with no evidence of any interatrial shunt. Conclusion(s)/Recommendation(s): No LA/LAA thrombus identified. Negative bubble study for interatrial shunt. No intracardiac source  of embolism detected on this on this transesophageal echocardiogram. FINDINGS  Left Ventricle: Left ventricular ejection fraction, by estimation, is 20 to 25%. The left ventricle has severely decreased function. The left ventricle demonstrates global hypokinesis. The left ventricular internal cavity size was mildly to moderately dilated. Right Ventricle: The right ventricular size is normal. No increase in right ventricular wall thickness. Right ventricular systolic function is severely reduced. Left Atrium: Left atrial size was not well visualized. No left atrial/left atrial appendage thrombus was detected. The LAA emptying velocity was 32 cm/s. Right Atrium: Right atrial size was normal in size. Pericardium: There is no evidence of pericardial effusion. Mitral Valve: The mitral valve is normal in structure. Mild mitral valve regurgitation. Tricuspid Valve: The tricuspid valve is normal in structure. Tricuspid valve regurgitation is mild. Aortic Valve: The  aortic valve is tricuspid. Aortic valve regurgitation is trivial. Pulmonic Valve: The pulmonic valve was normal in structure. Pulmonic valve regurgitation is not visualized. Aorta: The aortic root is normal in size and structure. IAS/Shunts: No atrial level shunt detected by color flow Doppler. Agitated saline contrast was given intravenously to evaluate for intracardiac shunting. Agitated saline contrast bubble study was negative, with no evidence of any interatrial shunt. Additional Comments: Spectral Doppler performed. TRICUSPID VALVE TR Peak grad:   44.4 mmHg TR Vmax:        333.00 cm/s Debbe Odea MD Electronically signed by Debbe Odea MD Signature Date/Time: 10/08/2022/3:19:31 PM    Final    Recent Labs    10/08/22 0408 10/10/22 0526  WBC 11.9* 7.7  HGB 15.5 16.9  HCT 47.7 50.1  PLT 252 257   Recent Labs    10/08/22 0408 10/10/22 0526  NA 138 137  K 3.8 3.7  CL 107 100  CO2 25 28  GLUCOSE 110* 92  BUN 15 15  CREATININE 0.83 1.10  CALCIUM 8.9 9.3    Intake/Output Summary (Last 24 hours) at 10/10/2022 0932 Last data filed at 10/10/2022 0820 Gross per 24 hour  Intake 716 ml  Output 600 ml  Net 116 ml        Physical Exam: Vital Signs Blood pressure 112/79, pulse 73, temperature 98.4 F (36.9 C), temperature source Oral, resp. rate 16, height 6\' 3"  (1.905 m), weight 105.9 kg, SpO2 100 %.   General: Alert and oriented x 3, No apparent distress HEENT: Head is normocephalic, atraumatic, PERRLA, EOMI, sclera anicteric, oral mucosa pink and moist, dentition intact, ext ear canals clear,  Neck: Supple without JVD or lymphadenopathy Heart: Reg rate and rhythm. No murmurs rubs or gallops Chest: CTA bilaterally without wheezes, rales, or rhonchi; no distress Holter monitor in place  Abdomen: Soft, non-tender, non-distended, bowel sounds positive. Extremities: No clubbing, cyanosis, or edema. Pulses are 2+ Psych: Pt's affect is appropriate. Pt is  cooperative Skin: Clean and intact without signs of breakdown Neuro:  alert and oriented x4, follows commands, CN 2-12 intact other than slight facial weakness, Mild aphasia. Fair judgement and insight.  Strength 5/5 in all LUE and LLE Strength 2-3/5 in RUE shoulder abduction, elbow extension and flexion, finger flexion Strength 4/5 proximal  5/5 distal RLE Sensation intact in all 4 extremities but decreased in RUE FTN intact on Left Heel to shin slightly altered on RLE Musculoskeletal: NO joint swelling or tenderness noted, decreased tone RUE Assessment/Plan: 1. Functional deficits which require 3+ hours per day of interdisciplinary therapy in a comprehensive inpatient rehab setting. Physiatrist is providing close team supervision and 24 hour management of  active medical problems listed below. Physiatrist and rehab team continue to assess barriers to discharge/monitor patient progress toward functional and medical goals  Care Tool:  Bathing    Body parts bathed by patient: Right arm, Chest, Abdomen, Front perineal area, Buttocks, Right upper leg, Left upper leg, Right lower leg, Left lower leg, Face   Body parts bathed by helper: Left arm     Bathing assist Assist Level: Moderate Assistance - Patient 50 - 74%     Upper Body Dressing/Undressing Upper body dressing   What is the patient wearing?: Pull over shirt    Upper body assist Assist Level: Maximal Assistance - Patient 25 - 49%    Lower Body Dressing/Undressing Lower body dressing      What is the patient wearing?: Pants     Lower body assist Assist for lower body dressing: Moderate Assistance - Patient 50 - 74%     Toileting Toileting    Toileting assist Assist for toileting: Moderate Assistance - Patient 50 - 74%     Transfers Chair/bed transfer  Transfers assist     Chair/bed transfer assist level: Moderate Assistance - Patient 50 - 74%     Locomotion Ambulation   Ambulation assist               Walk 10 feet activity   Assist           Walk 50 feet activity   Assist           Walk 150 feet activity   Assist           Walk 10 feet on uneven surface  activity   Assist           Wheelchair     Assist               Wheelchair 50 feet with 2 turns activity    Assist            Wheelchair 150 feet activity     Assist          Blood pressure 112/79, pulse 73, temperature 98.4 F (36.9 C), temperature source Oral, resp. rate 16, height 6\' 3"  (1.905 m), weight 105.9 kg, SpO2 100 %.  Medical Problem List and Plan: 1. 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  Antithrombotics: -DVT/anticoagulation:  Mechanical: Antiembolism stockings, thigh (TED hose) Bilateral lower extremities             -antiplatelet therapy: Aspirin 81 mg daily and Plavix 75 mg daily 3. Pain Management: Tylenol as needed 4. Mood/Behavior/Sleep: Provide emotional support             -antipsychotic agents: N/A 5. Neuropsych/cognition: This patient is not capable of making decisions on his own behalf. 6. Skin/Wound Care: Routine skin checks 7. Fluids/Electrolytes/Nutrition: Routine in and outs with follow-up chemistries 8.  CAD/history of PCI/chronic systolic congestive heart failure with ejection fraction 20 to 25%.  Daily weights. TEE without thrombus.  Monitor for any signs of fluid overload.  Continue Farxiga 10 mg daily.  Continue asa and plavix.   Follow-up outpatient cardiology services -continue Holter monitor,  9.  Hypertension.  Lisinopril 2.5 mg daily, Toprol-XL 12.5 mg daily.  Monitor with increased mobility  -12/15 well controlled, continue current medications 10.  Hyperlipidemia.  Lipitor  daily. Heart healthy diet 11.  History of tobacco as well as polysubstance use.  Urine drug screen positive  marijuana as well as cocaine.  Provide counseling 12.  Obesity.  BMI 30.70.  Dietary follow-up 13. Leukocytosis  -Wnl 12/15      LOS: 1 days A FACE TO FACE EVALUATION WAS PERFORMED  Fanny Dance 10/10/2022, 9:32 AM

## 2022-10-11 NOTE — Progress Notes (Signed)
PROGRESS NOTE   Subjective/Complaints: No new concerns or complaints this AM. Holter monitor in place.     Review of Systems  Constitutional:  Negative for chills and fever.  HENT:  Negative for congestion.   Eyes:  Negative for double vision.  Respiratory:  Negative for cough.   Cardiovascular:  Negative for chest pain and palpitations.  Gastrointestinal:  Negative for abdominal pain, nausea and vomiting.  Genitourinary: Negative.   Neurological:  Positive for sensory change and focal weakness. Negative for weakness and headaches.  Psychiatric/Behavioral:  The patient has insomnia.     Objective:   No results found. Recent Labs    10/10/22 0526  WBC 7.7  HGB 16.9  HCT 50.1  PLT 257    Recent Labs    10/10/22 0526  NA 137  K 3.7  CL 100  CO2 28  GLUCOSE 92  BUN 15  CREATININE 1.10  CALCIUM 9.3     Intake/Output Summary (Last 24 hours) at 10/11/2022 1934 Last data filed at 10/11/2022 1538 Gross per 24 hour  Intake 476 ml  Output 2275 ml  Net -1799 ml         Physical Exam: Vital Signs Blood pressure 104/73, pulse 73, temperature 98.5 F (36.9 C), temperature source Oral, resp. rate 18, height 6\' 3"  (1.905 m), weight 105.9 kg, SpO2 100 %.   General: Alert and oriented x 3, No apparent distress HEENT: Head is normocephalic, atraumatic, PERRLA,conjugate gaze, sclera anicteric, oral mucosa pink and moist,  Neck: Supple without JVD or lymphadenopathy Heart: Reg rate and rhythm. No murmurs rubs or gallops, holter monitor in place Chest: CTA bilaterally without wheezes, rales, or rhonchi; no distress Holter monitor in place  Abdomen: Soft, non-tender, non-distended, bowel sounds positive. Extremities: No clubbing, cyanosis, or edema. Pulses are 2+ Psych: Pt's affect is appropriate. Pt is cooperative. pleasant Skin: Clean and intact without signs of breakdown Neuro:  alert and oriented x4, follows  commands, CN 2-12 intact other than slight facial weakness, Mild aphasia. Fair judgement and insight.  Strength 5/5 in all LUE and LLE Strength 2-3/5 in RUE shoulder abduction, elbow extension and flexion, finger flexion Strength 4/5 proximal  5/5 distal RLE Sensation intact in all 4 extremities but decreased in RUE FTN intact on Left Heel to shin slightly altered on RLE Musculoskeletal: NO joint swelling or tenderness noted, decreased tone RUE Assessment/Plan: 1. Functional deficits which require 3+ hours per day of interdisciplinary therapy in a comprehensive inpatient rehab setting. Physiatrist is providing close team supervision and 24 hour management of active medical problems listed below. Physiatrist and rehab team continue to assess barriers to discharge/monitor patient progress toward functional and medical goals  Care Tool:  Bathing    Body parts bathed by patient: Right arm, Chest, Abdomen, Front perineal area, Buttocks, Right upper leg, Left upper leg, Right lower leg, Left lower leg, Face   Body parts bathed by helper: Left arm     Bathing assist Assist Level: Moderate Assistance - Patient 50 - 74%     Upper Body Dressing/Undressing Upper body dressing   What is the patient wearing?: Pull over shirt    Upper body assist Assist  Level: Maximal Assistance - Patient 25 - 49%    Lower Body Dressing/Undressing Lower body dressing      What is the patient wearing?: Pants     Lower body assist Assist for lower body dressing: Moderate Assistance - Patient 50 - 74%     Toileting Toileting    Toileting assist Assist for toileting: Moderate Assistance - Patient 50 - 74%     Transfers Chair/bed transfer  Transfers assist     Chair/bed transfer assist level: Minimal Assistance - Patient > 75%     Locomotion Ambulation   Ambulation assist      Assist level: Minimal Assistance - Patient > 75% Assistive device: Hand held assist Max distance: 180'   Walk  10 feet activity   Assist     Assist level: Minimal Assistance - Patient > 75% Assistive device: Hand held assist   Walk 50 feet activity   Assist    Assist level: Minimal Assistance - Patient > 75% Assistive device: Hand held assist    Walk 150 feet activity   Assist    Assist level: Minimal Assistance - Patient > 75% Assistive device: Hand held assist    Walk 10 feet on uneven surface  activity   Assist     Assist level: Minimal Assistance - Patient > 75% Assistive device: Hand held assist   Wheelchair     Assist Is the patient using a wheelchair?: Yes Type of Wheelchair: Manual    Wheelchair assist level: Supervision/Verbal cueing Max wheelchair distance: >200'    Wheelchair 50 feet with 2 turns activity    Assist        Assist Level: Supervision/Verbal cueing   Wheelchair 150 feet activity     Assist      Assist Level: Supervision/Verbal cueing   Blood pressure 104/73, pulse 73, temperature 98.5 F (36.9 C), temperature source Oral, resp. rate 18, height 6\' 3"  (1.905 m), weight 105.9 kg, SpO2 100 %.  Medical Problem List and Plan: 1. Functional deficits secondary to left MCA territory infarction             -patient may  shower             -ELOS/Goals: 10-12 days, mod I PT/OT, ind SLP             -CIR with PT/OT/SLP  2.  Antithrombotics: -DVT/anticoagulation:  Mechanical: Antiembolism stockings, thigh (TED hose) Bilateral lower extremities             -antiplatelet therapy: Aspirin 81 mg daily and Plavix 75 mg daily 3. Pain Management: Tylenol as needed 4. Mood/Behavior/Sleep: Provide emotional support             -antipsychotic agents: N/A 5. Neuropsych/cognition: This patient is not capable of making decisions on his own behalf. 6. Skin/Wound Care: Routine skin checks 7. Fluids/Electrolytes/Nutrition: Routine in and outs with follow-up chemistries 8.  CAD/history of PCI/chronic systolic congestive heart failure with  ejection fraction 20 to 25%.  Daily weights. TEE without thrombus.  Monitor for any signs of fluid overload.  Continue Farxiga 10 mg daily.  Continue asa and plavix.   Follow-up outpatient cardiology services -continue Holter monitor, called company as holter monitor blinking red, called company- noted to be bad contact with skin, contact improved and it turned green, continue to monitor 9.  Hypertension.  Lisinopril 2.5 mg daily, Toprol-XL 12.5 mg daily.   Monitor with increased mobility  -12/16 well controlled 10.  Hyperlipidemia.  Lipitor 80mg  daily.  Heart healthy diet 11.  History of tobacco as well as polysubstance use.  Urine drug screen positive marijuana as well as cocaine.  Provide counseling 12.  Obesity.  BMI 30.70.  Dietary follow-up 13. Leukocytosis  -Wnl 12/15  -recheck Monday ordered      LOS: 2 days A FACE TO FACE EVALUATION WAS PERFORMED  Fanny Dance 10/11/2022, 7:34 PM

## 2022-10-11 NOTE — Progress Notes (Signed)
Occupational Therapy Session Note  Patient Details  Name: Ricardo Yang MRN: [id number] Date of Birth: [birth year]  Today's Date: 10/11/2022 OT Individual Time: 1117-1200 OT Individual Time Calculation (min): 43 min    Short Term Goals: Week 1:  OT Short Term Goal 1 (Week 1): Pt will don shirt with min A OT Short Term Goal 2 (Week 1): Pt will complete LB dressing with min A OT Short Term Goal 3 (Week 1): Pt will complete toilet transfer with min A with LRAD  Skilled Therapeutic Interventions/Progress Updates:    Patient received dressed and in wheelchair.  Agreeable to go to gym to address RUE functioning.  Transported patient to gym.  Patient able to transfer stand step without device and with contact guard assist and cueing for postural control - stand upright.  Patient asking for a sling because he doesn't like the look of his arm dangling.  Discussed and demonstrated standing tall and activation of scapular stabilizing muscles as well as back extensors to reduce arm dangling.  Worked in supine to address shoulder activation.  Working to reduce over activation and muscle balance for pre-reach patterns.  Worked on guided shoulder flexion with elbow extension, and shoulder flexion with elbow flexion.  Cued patient to reduce use of internal rotation to move distal UE.  Patient with delayed motor response but able to activate isolated elbow flex/ext, forearm pro/supination, wrist flexion and extension.  Patient returned to room, safety belt in lace and engaged, call bell within reach.    Therapy Documentation Precautions:  Precautions Precautions: Fall Type of Shoulder Precautions: R hemi UE>LE Restrictions Weight Bearing Restrictions: No   Denies pain   Therapy/Group: Individual Therapy  [name] 10/11/2022, 12:19 PM

## 2022-10-11 NOTE — Progress Notes (Signed)
Occupational Therapy Session Note  Patient Details  Name: Ricardo Yang MRN: [id number] Date of Birth: [birth year]  Today's Date: 10/11/2022 OT Individual Time: 1117-1200 OT Individual Time Calculation (min): 43 min    Short Term Goals: Week 1:  OT Short Term Goal 1 (Week 1): Pt will don shirt with min A OT Short Term Goal 2 (Week 1): Pt will complete LB dressing with min A OT Short Term Goal 3 (Week 1): Pt will complete toilet transfer with min A with LRAD  Skilled Therapeutic Interventions/Progress Updates:  Patient seated at w/c level of funciton upon arrival. Patient agreed to working on UB exercises to improve function related to BADL task completion. The pt was able to tolerate PROM and manual manipulation of RUE to improve tone and active mobility.  Patient  was also educated in relating to CVA's  and  the importance of incorporating visual acuity to aid with return in functional mobility .  At the end of treatment, the pt completed UB theraex using his LUE for shld flexion and elbow extension 2 sets of 15 with rest breaks as needed, the pt require 2 rest breaks and a table top task removing suction pieces, using his R hemiparetic extremity for gains in function.  The pt remained at w/c LOF with his call light and bedside table within reach and all additional needs addressed, the pt had no report of pain during this treatment session.   Therapy Documentation Precautions:  Precautions Precautions: Fall Type of Shoulder Precautions: R hemi UE>LE Restrictions Weight Bearing Restrictions: No  Therapy/Group: Individual Therapy  Lavona Mound 10/11/2022, 1:28 PM

## 2022-10-11 NOTE — Plan of Care (Signed)
  Problem: Consults Goal: RH STROKE PATIENT EDUCATION Description: See Patient Education module for education specifics  Outcome: Progressing   Problem: RH BOWEL ELIMINATION Goal: RH STG MANAGE BOWEL WITH ASSISTANCE Description: STG Manage Bowel with mod I Assistance. Outcome: Progressing Goal: RH STG MANAGE BOWEL W/MEDICATION W/ASSISTANCE Description: STG Manage Bowel with Medication with mod I  Assistance. Outcome: Progressing   Problem: RH SAFETY Goal: RH STG ADHERE TO SAFETY PRECAUTIONS W/ASSISTANCE/DEVICE Description: STG Adhere to Safety Precautions With cues Assistance/Device. Outcome: Progressing   Problem: RH KNOWLEDGE DEFICIT Goal: RH STG INCREASE KNOWLEDGE OF HYPERTENSION Description: Patient and S.O. will be able to manage HTN with medications and dietary modifications using educational handouts independently Outcome: Progressing Goal: RH STG INCREASE KNOWLEGDE OF HYPERLIPIDEMIA Description: Patient and S.O. will be able to manage HLD with medications and dietary modifications using educational handouts independently Outcome: Progressing Goal: RH STG INCREASE KNOWLEDGE OF STROKE PROPHYLAXIS Description: Patient and S.O. will be able to manage secondary risks management medications and dietary modifications using educational handouts independently Outcome: Progressing   Problem: Education: Goal: Knowledge of disease or condition will improve Outcome: Progressing Goal: Knowledge of secondary prevention will improve (MUST DOCUMENT ALL) Outcome: Progressing Goal: Knowledge of patient specific risk factors will improve Loraine Leriche N/A or DELETE if not current risk factor) Outcome: Progressing   Problem: Ischemic Stroke/TIA Tissue Perfusion: Goal: Complications of ischemic stroke/TIA will be minimized Outcome: Progressing   Problem: Coping: Goal: Will verbalize positive feelings about self Outcome: Progressing Goal: Will identify appropriate support needs Outcome:  Progressing   Problem: Health Behavior/Discharge Planning: Goal: Ability to manage health-related needs will improve Outcome: Progressing Goal: Goals will be collaboratively established with patient/family Outcome: Progressing   Problem: Self-Care: Goal: Ability to participate in self-care as condition permits will improve Outcome: Progressing Goal: Verbalization of feelings and concerns over difficulty with self-care will improve Outcome: Progressing Goal: Ability to communicate needs accurately will improve Outcome: Progressing   Problem: Nutrition: Goal: Risk of aspiration will decrease Outcome: Progressing Goal: Dietary intake will improve Outcome: Progressing

## 2022-10-11 NOTE — Progress Notes (Signed)
Physical Therapy Session Note  Patient Details  Name: Ricardo Yang MRN: [id number] Date of Birth: [birth year]  Today's Date: 10/11/2022 PT Individual Time: Session 1: 0800-0900    Session 2: 1445-1530 PT Individual Time Calculation (min):  Session 1: 60 min  Session 2: 45 min  Short Term Goals: Week 1:  PT Short Term Goal 1 (Week 1): Patient will ambulate >79' with LRAD and CGA PT Short Term Goal 2 (Week 1): Patient will ascend/descend 12 steps with L HR and CGA PT Short Term Goal 3 (Week 1): Patient will performed stand pivot transfer with LRAD and CGA  Skilled Therapeutic Interventions/Progress Updates:  Session 1: Chart reviewed and pt agreeable to therapy. Pt received on semi-reclined in bed with no c/o pain. Session focused on activity tolerance, LE strengthening, and pre-gait activities to improve gait function in preparation for home access. Pt initiated session with transfer to [facility] using MinA. Pt then transferred to therapy gym for time management. In gym, pt completed 10 mins on NuStep of interval training at low-med-high levels across workloads 6-9. Activity emphasized BLE movement quality and endurance. Pt then completed 3x10 of high knee marches with each leg using MinA +  LBQC. Pt noted to have increased difficulty with WB on RLE, so pt completed further 2x10 L knee raises using MinA+ LBQC and emphasis/VC for postural and support leg control.  At end of session, pt was left seated in [facility] with alarm engaged, nurse call bell and all needs in reach.  Session 2: Chart reviewed and pt agreeable to therapy. Pt received on seated in WC with no c/o pain. Session focused on ambulation in preparation for home access. Pt initiated session with sit to stand using CGA + LBQC. Pt completed 3x10 marching as warm up with VC for movement quality. Pt then completed 3 trials of amb at 61ft + 75ft + 14ft using MinA + LBQC. Pt verbalized challenges after each trial. Of note, pt needed VC to widen BOS and  maintain safe stride with LBQC. At end of session, pt was left seated in [facility] with alarm engaged, nurse call bell and all needs in reach.    Therapy Documentation Precautions:  Precautions Precautions: Fall Type of Shoulder Precautions: R hemi UE>LE Restrictions Weight Bearing Restrictions: No    Therapy/Group: Individual Therapy  Dionne Milo, PT, DPT 10/11/2022, 10:42 AM

## 2022-10-12 NOTE — IPOC Note (Signed)
Overall Plan of Care [facility]) Patient Details Name: Ricardo Yang MRN: [id number] DOB: Oct 01, 1976  Admitting Diagnosis: Left middle cerebral artery stroke [facility])  Hospital Problems: Principal Problem:   Left middle cerebral artery stroke [facility])     Functional Problem List: Nursing Medication Management, Bowel, Safety, Endurance  PT Balance, Perception, Safety, Sensory, Endurance, Motor  OT Balance, Perception, Safety, Sensory, Endurance, Motor  SLP Linguistic, Motor  TR         Basic ADL's: OT Bathing, Dressing, Toileting, Grooming, Eating     Advanced  ADL's: OT       Transfers: PT Bed Mobility, Bed to Chair, Car, Occupational psychologist, Research scientist (life sciences): PT Ambulation, Psychologist, prison and probation services, Stairs     Additional Impairments: OT Fuctional Use of Upper Extremity  SLP Communication expression    TR      Anticipated Outcomes Item Anticipated Outcome  Self Feeding (S)  Swallowing      Basic self-care  (S)  Toileting  (S)   Bathroom Transfers (S)  Bowel/Bladder  manage bowel w mod I  Transfers  ModI with LRAD  Locomotion  ModI with LRAD  Communication  mod I  Cognition     Pain  n/a  Safety/Judgment  manage w cues   Therapy Plan: PT Intensity: Minimum of 1-2 x/day ,45 to 90 minutes PT Frequency: 5 out of 7 days PT Duration Estimated Length of Stay: 10-14 OT Intensity: Minimum of 1-2 x/day, 45 to 90 minutes OT Frequency: 5 out of 7 days OT Duration/Estimated Length of Stay: 10-14 days SLP Intensity: Minumum of 1-2 x/day, 30 to 90 minutes SLP Frequency: 1 to 3 out of 7 days SLP Duration/Estimated Length of Stay: 10-14 days   Team Interventions: Nursing Interventions Bowel Management, Medication Management, Discharge Planning, Disease Management/Prevention, Patient/Family Education  PT interventions Ambulation/gait training, Community reintegration, Fish farm manager, Neuromuscular re-education, Psychosocial support, Software engineer, UE/LE Strength taining/ROM, Wheelchair propulsion/positioning, Warden/ranger, Discharge planning, Functional electrical stimulation, Pain management, Skin care/wound management, Therapeutic Activities, UE/LE Coordination activities, Cognitive remediation/compensation, Disease management/prevention, Functional mobility training, Patient/family education, Splinting/orthotics, Therapeutic Exercise, Visual/perceptual remediation/compensation  OT Interventions Balance/vestibular training, Discharge planning, Functional electrical stimulation, Pain management, Self Care/advanced ADL retraining, Therapeutic Activities, UE/LE Coordination activities, Cognitive remediation/compensation, Functional mobility training, Skin care/wound managment, Patient/family education, Therapeutic Exercise, Community reintegration, DME/adaptive equipment instruction, Neuromuscular re-education, Psychosocial support, UE/LE Strength taining/ROM, Splinting/orthotics, Wheelchair propulsion/positioning, Disease mangement/prevention  SLP Interventions Speech/Language facilitation, Functional tasks, Therapeutic Activities, Patient/family education  TR Interventions    SW/CM Interventions Discharge Planning, Psychosocial Support, Patient/Family Education   Barriers to Discharge MD  Medical stability and Home enviroment access/loayout  Nursing Decreased caregiver support 1 level ramped entry w S.O  PT Insurance for SNF coverage    OT Inaccessible home environment will move to new house after Dec 27 with 2 stairs  SLP      SW [employer] for SNF coverage     Team Discharge Planning: Destination: PT-Home ,OT- Home , SLP-Home Projected Follow-up: PT-Outpatient PT, OT-  Home health OT, SLP-Other (comment) (TBD) Projected Equipment Needs: PT-To be determined, OT- Tub/shower bench, SLP-None recommended by SLP Equipment Details: PT-TBD- Patient owns a walker, SPC (hurricane), crutches, OT-  Patient/family  involved in discharge planning: PT- Patient,  OT-Patient, SLP-Patient  MD ELOS: 10-12 Medical Rehab Prognosis:  Excellent Assessment: The patient has been admitted for CIR therapies with the diagnosis of left MCA territory infarction . The team will be addressing functional mobility, strength, stamina, balance, safety,  adaptive techniques and equipment, self-care, bowel and bladder mgt, patient and caregiver education. Goals have been set at mod I PT/OT, ind SLP . Anticipated discharge destination is home.        See Team Conference Notes for weekly updates to the plan of care

## 2022-10-13 LAB — BASIC METABOLIC PANEL
Anion gap: 8 (ref 5–15)
BUN: 15 mg/dL (ref 6–20)
CO2: 25 mmol/L (ref 22–32)
Calcium: 9.2 mg/dL (ref 8.9–10.3)
Chloride: 103 mmol/L (ref 98–111)
Creatinine, Ser: 0.91 mg/dL (ref 0.61–1.24)
GFR, Estimated: >60 mL/min (ref >60)
Glucose, Bld: 89 mg/dL (ref 70–99)
Potassium: 3.8 mmol/L (ref 3.5–5.1)
Sodium: 136 mmol/L (ref 135–145)

## 2022-10-13 LAB — CBC
HCT: 48.4 % (ref 39.0–52.0)
Hemoglobin: 16.3 g/dL (ref 13.0–17.0)
MCH: 28.5 pg (ref 26.0–34.0)
MCHC: 33.7 g/dL (ref 30.0–36.0)
MCV: 84.8 fL (ref 80.0–100.0)
Platelets: 274 10*3/uL (ref 150–400)
RBC: 5.71 MIL/uL (ref 4.22–5.81)
RDW: 12.9 % (ref 11.5–15.5)
WBC: 6.8 10*3/uL (ref 4.0–10.5)
nRBC: 0 % (ref 0.0–0.2)

## 2022-10-13 MED ORDER — SORBITOL 70 % SOLN
30.0000 mL | Freq: Every day | Status: DC | PRN
  Administered 2022-10-14 – 2022-10-19 (×3): 30 mL via ORAL
  Filled 2022-10-13 (×3): qty 30

## 2022-10-13 NOTE — Progress Notes (Signed)
Occupational Therapy Session Note  Patient Details  Name: Ricardo Yang MRN: [id number] Date of Birth: 10-Feb-1976  Today's Date: 10/13/2022 OT Individual Time: 0920-1005 OT Individual Time Calculation (min): 45 min    Short Term Goals: Week 1:  OT Short Term Goal 1 (Week 1): Pt will don shirt with min A OT Short Term Goal 2 (Week 1): Pt will complete LB dressing with min A OT Short Term Goal 3 (Week 1): Pt will complete toilet transfer with min A with LRAD  Skilled Therapeutic Interventions/Progress Updates:    Patient presents supine in bed.  Patient fully dressed opting to go to gym to work on RUE at this time.  Patient transported to gym - worked in modified plantigrade position to load RUE discouraging excessive internal rotation at [facility] joint.  Worked on weight shift to RUE, stretching shoulder into increasing degrees of shoulder flexion, and increasing activation in scapula stabilizers.   Followed up with attempts at functional use of hand for cutting with scissors.  Working on decreasing activation of trunk/ shoudler / elbow muscles to close and open hand.  Patient able to isolate thumb and finger movement toward closing and partially opening scissors.  Worked on isolation of wrist extension, noting trace MC extension this session. Patient left up in wheelchair with safety belt in place and engaged, call bell and personal items in reach.    Therapy Documentation Precautions:  Precautions Precautions: Fall Type of Shoulder Precautions: R hemi UE>LE Restrictions Weight Bearing Restrictions: No  Pain: Denies pain   Therapy/Group: Individual Therapy  [name] 10/13/2022, 12:22 PM

## 2022-10-13 NOTE — Progress Notes (Signed)
PROGRESS NOTE   Subjective/Complaints: No new complaints or concerns.      Review of Systems  Constitutional:  Negative for fever.  HENT:  Negative for congestion.   Eyes:  Negative for double vision.  Respiratory:  Negative for cough.   Cardiovascular:  Negative for chest pain.  Gastrointestinal:  Negative for abdominal pain, nausea and vomiting.  Genitourinary: Negative.   Neurological:  Positive for sensory change and focal weakness. Negative for dizziness, weakness and headaches.  Psychiatric/Behavioral:  Negative for depression. The patient has insomnia.     Objective:   No results found. Recent Labs    10/13/22 0709  WBC 6.8  HGB 16.3  HCT 48.4  PLT 274    Recent Labs    10/13/22 0709  NA 136  K 3.8  CL 103  CO2 25  GLUCOSE 89  BUN 15  CREATININE 0.91  CALCIUM 9.2     Intake/Output Summary (Last 24 hours) at 10/13/2022 0948 Last data filed at 10/13/2022 0809 Gross per 24 hour  Intake 960 ml  Output 725 ml  Net 235 ml         Physical Exam: Vital Signs Blood pressure 118/76, pulse 76, temperature 98.7 F (37.1 C), temperature source Oral, resp. rate 16, height 6\' 3"  (1.905 m), weight 107.5 kg, SpO2 100 %.   General: Alert and oriented x 3, No apparent distress HEENT: Head is normocephalic, atraumatic, PERRLA,conjugate gaze, sclera anicteric, oral mucosa pink and moist,  Neck: Supple without JVD or lymphadenopathy Heart: Reg rate and rhythm. No murmurs rubs or gallops, holter monitor in place Chest: CTA bilaterally without wheezes, rales, or rhonchi; no distress Holter monitor in place  Abdomen: Soft, non-tender, non-distended, bowel sounds positive. Extremities: No clubbing, cyanosis, or edema. Pulses are 2+ Psych: Pt's affect is appropriate. Pt is cooperative. pleasant Skin: warm and dry Neuro:  alert and oriented x4, follows commands, CN 2-12 grossly intact, Mild aphasia. Fair  judgement and insight.  Strength 5/5 in all LUE and LLE Strength 2-3/5 in RUE shoulder abduction, elbow extension and flexion, finger flexion Strength 4/5 proximal  5/5 distal RLE Sensation intact in all 4 extremities but decreased in RUE FTN intact on Left Heel to shin slightly altered on RLE Musculoskeletal: NO joint swelling or tenderness noted, decreased tone RUE    Assessment/Plan: 1. Functional deficits which require 3+ hours per day of interdisciplinary therapy in a comprehensive inpatient rehab setting. Physiatrist is providing close team supervision and 24 hour management of active medical problems listed below. Physiatrist and rehab team continue to assess barriers to discharge/monitor patient progress toward functional and medical goals  Care Tool:  Bathing    Body parts bathed by patient: Right arm, Chest, Abdomen, Front perineal area, Buttocks, Right upper leg, Left upper leg, Right lower leg, Left lower leg, Face   Body parts bathed by helper: Left arm     Bathing assist Assist Level: Moderate Assistance - Patient 50 - 74%     Upper Body Dressing/Undressing Upper body dressing   What is the patient wearing?: Pull over shirt    Upper body assist Assist Level: Maximal Assistance - Patient 25 - 49%  Lower Body Dressing/Undressing Lower body dressing      What is the patient wearing?: Pants     Lower body assist Assist for lower body dressing: Moderate Assistance - Patient 50 - 74%     Toileting Toileting    Toileting assist Assist for toileting: Moderate Assistance - Patient 50 - 74%     Transfers Chair/bed transfer  Transfers assist     Chair/bed transfer assist level: Minimal Assistance - Patient > 75%     Locomotion Ambulation   Ambulation assist      Assist level: Minimal Assistance - Patient > 75% Assistive device: Hand held assist Max distance: 180'   Walk 10 feet activity   Assist     Assist level: Minimal Assistance -  Patient > 75% Assistive device: Hand held assist   Walk 50 feet activity   Assist    Assist level: Minimal Assistance - Patient > 75% Assistive device: Hand held assist    Walk 150 feet activity   Assist    Assist level: Minimal Assistance - Patient > 75% Assistive device: Hand held assist    Walk 10 feet on uneven surface  activity   Assist     Assist level: Minimal Assistance - Patient > 75% Assistive device: Hand held assist   Wheelchair     Assist Is the patient using a wheelchair?: Yes Type of Wheelchair: Manual    Wheelchair assist level: Supervision/Verbal cueing Max wheelchair distance: >200'    Wheelchair 50 feet with 2 turns activity    Assist        Assist Level: Supervision/Verbal cueing   Wheelchair 150 feet activity     Assist      Assist Level: Supervision/Verbal cueing   Blood pressure 118/76, pulse 76, temperature 98.7 F (37.1 C), temperature source Oral, resp. rate 16, height 6\' 3"  (1.905 m), weight 107.5 kg, SpO2 100 %.  Medical Problem List and Plan: 1. Functional deficits secondary to left MCA territory infarction             -patient may  shower             -ELOS/Goals: 10-12 days, mod I PT/OT, ind SLP             -CIR with PT/OT/SLP  2.  Antithrombotics: -DVT/anticoagulation:  Mechanical: Antiembolism stockings, thigh (TED hose) Bilateral lower extremities             -antiplatelet therapy: Aspirin 81 mg daily and Plavix 75 mg daily 3. Pain Management: Tylenol as needed 4. Mood/Behavior/Sleep: Provide emotional support             -antipsychotic agents: N/A 5. Neuropsych/cognition: This patient is not capable of making decisions on his own behalf. 6. Skin/Wound Care: Routine skin checks 7. Fluids/Electrolytes/Nutrition: Routine in and outs with follow-up chemistries 8.  CAD/history of PCI/chronic systolic congestive heart failure with ejection fraction 20 to 25%.  Daily weights. TEE without thrombus.  Monitor  for any signs of fluid overload.  Continue Farxiga 10 mg daily.  Continue asa and plavix.   Follow-up outpatient cardiology services -continue Holter monitor, called company as holter monitor blinking red, called company- noted to be bad contact with skin, contact improved and it turned green, continue to monitor 9.  Hypertension.  Lisinopril 2.5 mg daily, Toprol-XL 12.5 mg daily.   Monitor with increased mobility  -12/18 well controlled      10/13/2022    8:32 AM 10/13/2022    5:00 AM 10/13/2022  4:21 AM  Vitals with BMI  Weight  237 lbs   BMI  29.62   Systolic 118  116  Diastolic 76  79  Pulse 76  74    10.  Hyperlipidemia.  Lipitor 80mg  daily. Heart healthy diet 11.  History of tobacco as well as polysubstance use.  Urine drug screen positive marijuana as well as cocaine.  Provide counseling 12.  Obesity.  BMI 30.70.  Dietary follow-up 13. Leukocytosis  -WNL 12/18      LOS: 4 days A FACE TO FACE EVALUATION WAS PERFORMED  1/19 10/13/2022, 9:48 AM

## 2022-10-13 NOTE — Progress Notes (Signed)
Physical Therapy Session Note  Patient Details  Name: Ricardo Yang MRN: [id number] Date of Birth: 1976-03-16  Today's Date: 10/13/2022 PT Individual Time: 1517-1600 PT Individual Time Calculation (min): 43 min   Short Term Goals: Week 1:  PT Short Term Goal 1 (Week 1): Patient will ambulate >78' with LRAD and CGA PT Short Term Goal 2 (Week 1): Patient will ascend/descend 12 steps with L HR and CGA PT Short Term Goal 3 (Week 1): Patient will performed stand pivot transfer with LRAD and CGA  Skilled Therapeutic Interventions/Progress Updates:     Pt received seated in [facility] and agrees to therapy. No complaint of pain. WC transport to gym for time management. Pt performs stand step transfer to mat with CGA and cues for initiation and sequencing. Pt ambulates x400' without AD and with PT providing CGA/minA with tactile cueing at hips and trunk for stability, with cues for symmetrical reciprocal gait pattern. Following seated rest break, pt stands and ambulates additional 400' with slightly improved stability, with cues for upright gaze to improve posture and balance, and tactile cues for neutral posture, depressing L scapula. Pt has increased postural sway especially when ambulating slowly, and requires occasional cues to redirect pt to task, as his balance suffers when he is distracted. WC transport back to room. Left seated with alarm intact and all needs within reach  Therapy Documentation Precautions:  Precautions Precautions: Fall Type of Shoulder Precautions: R hemi UE>LE Restrictions Weight Bearing Restrictions: No   Therapy/Group: Individual Therapy  Beau Fanny, PT, DPT 10/13/2022, 4:55 PM

## 2022-10-13 NOTE — Progress Notes (Signed)
Physical Therapy Session Note  Patient Details  Name: Ricardo Yang MRN: [id number] Date of Birth: Feb 07, 1976  Today's Date: 10/13/2022 PT Individual Time: 1117-1215 PT Individual Time Calculation (min): 58 min   Short Term Goals: Week 1:  PT Short Term Goal 1 (Week 1): Patient will ambulate >50' with LRAD and CGA PT Short Term Goal 2 (Week 1): Patient will ascend/descend 12 steps with L HR and CGA PT Short Term Goal 3 (Week 1): Patient will performed stand pivot transfer with LRAD and CGA  Skilled Therapeutic Interventions/Progress Updates:  Patient seated upright in w/c on entrance to room. Patient alert and agreeable to PT session.   Patient with no pain complaint at start of session.  Therapeutic Activity: Transfers: Pt performed sit<>stand and stand pivot transfers throughout session with close supervision. Provided verbal cues for increasing forward lean for improved technique.  Gait Training:  Pt ambulated 125 ft using no AD with CGA/ MinA.  Demonstrated intermittent rare missteps with RLE. No LOB throughout. Provided vc/ tc for increasing RLE step height/ length.  Neuromuscular Re-ed: NMR facilitated during session with focus on dynamic standing balance. Pt guided in balance training using agility ladder. First bout with CGA and one foot in each square, then progressed to high knee step into each square. Performed out and back x2. Then progressed to alternating toe taps to cones outside of squares to R/ L of ladder. Required education re: need to perform conscious practice in order to allow for brain to develop and implement motor plan for improved coordination. Instructions on fwd/ bkwd weight shifting to progress stepping with balance. Two instances of LOB requiring MinA and then ModA to maintain. Pt also able to utilize stepping strategy to maintain balance during focused practice.   NMR performed for improvements in motor control and coordination, balance, sequencing,  judgement, and self confidence/ efficacy in performing all aspects of mobility at highest level of independence.   Patient seated upright in w/c at end of session with brakes locked, belt alarm set, and all needs within reach.   Therapy Documentation Precautions:  Precautions Precautions: Fall Type of Shoulder Precautions: R hemi UE>LE Restrictions Weight Bearing Restrictions: No General:   Vital Signs:   Pain:  No pain related this session.   Therapy/Group: Individual Therapy  Loel Dubonnet PT, DPT, CSRS 10/13/2022, 6:18 PM

## 2022-10-14 NOTE — Plan of Care (Addendum)
  Problem: RH Expression Communication Goal: LTG Patient will express needs/wants via multi-modal(SLP) Description: LTG:  Patient will express needs/wants via multi-modal communication (gestures/written, etc) with cues (SLP) Flowsheets (Taken 10/10/2022 1556) LTG: Patient will express needs/wants via multimodal communication (gestures/written, etc) with cueing (SLP): Modified Independent Goal: LTG Patient will increase speech intelligibility (SLP) Description: LTG: Patient will increase speech intelligibility at word/phrase/conversation level with cues, % of the time (SLP) Flowsheets (Taken 10/10/2022 1556) LTG: Patient will increase speech intelligibility (SLP): Modified Independent Level: Conversation level Goal: LTG Patient will increase word finding of common (SLP) Description: LTG:  Patient will increase word finding of common objects/daily info/abstract thoughts with cues using compensatory strategies (SLP). Flowsheets (Taken 10/10/2022 1556) LTG: Patient will increase word finding of common (SLP): Modified Independent

## 2022-10-14 NOTE — Progress Notes (Signed)
PROGRESS NOTE   Subjective/Complaints: Working with therapy this Am. No new concerns or complaints.     Review of Systems  Constitutional:  Negative for fever.  HENT:  Negative for congestion.   Respiratory:  Negative for shortness of breath.   Cardiovascular:  Negative for chest pain.  Gastrointestinal:  Negative for abdominal pain, constipation, nausea and vomiting.  Genitourinary: Negative.   Neurological:  Positive for sensory change and focal weakness. Negative for weakness.  Psychiatric/Behavioral:  Negative for depression. The patient has insomnia.     Objective:   No results found. Recent Labs    10/13/22 0709  WBC 6.8  HGB 16.3  HCT 48.4  PLT 274    Recent Labs    10/13/22 0709  NA 136  K 3.8  CL 103  CO2 25  GLUCOSE 89  BUN 15  CREATININE 0.91  CALCIUM 9.2     Intake/Output Summary (Last 24 hours) at 10/14/2022 1256 Last data filed at 10/14/2022 0914 Gross per 24 hour  Intake 952 ml  Output 1225 ml  Net -273 ml         Physical Exam: Vital Signs Blood pressure 106/75, pulse 76, temperature 98.8 F (37.1 C), temperature source Oral, resp. rate 16, height 6\' 3"  (1.905 m), weight 107.5 kg, SpO2 100 %.   General: NAD, working with  therapy HEENT: Head is normocephalic, atraumatic, PERRLA,conjugate gaze, oral mucosa pink and moist,  Neck: Supple without JVD or lymphadenopathy Heart: Reg rate and rhythm. No murmurs rubs or gallops, holter monitor in place Chest: CTA bilaterally without wheezes, rales, or rhonchi; no distress Holter monitor in place - green light flashing Abdomen: Soft, non-tender, non-distended, bowel sounds positive. Extremities: No clubbing, cyanosis, or edema. Pulses are 2+ Psych: Pt's affect is appropriate. Pt is cooperative. pleasant Skin: warm and dry Neuro:  alert and oriented x4, follows commands, CN 2-12 grossly intact, Mild aphasia. Fair judgement and  insight.  Strength 5/5 in all LUE and LLE Strength 2-3/5 in RUE shoulder abduction, elbow extension and flexion, finger flexion Strength 4/5 proximal  5/5 distal RLE Sensation intact in all 4 extremities but decreased in RUE Musculoskeletal: NO joint swelling or tenderness noted, decreased tone RUE    Assessment/Plan: 1. Functional deficits which require 3+ hours per day of interdisciplinary therapy in a comprehensive inpatient rehab setting. Physiatrist is providing close team supervision and 24 hour management of active medical problems listed below. Physiatrist and rehab team continue to assess barriers to discharge/monitor patient progress toward functional and medical goals  Care Tool:  Bathing    Body parts bathed by patient: Right arm, Chest, Abdomen, Front perineal area, Buttocks, Right upper leg, Left upper leg, Right lower leg, Left lower leg, Face   Body parts bathed by helper: Left arm     Bathing assist Assist Level: Moderate Assistance - Patient 50 - 74%     Upper Body Dressing/Undressing Upper body dressing   What is the patient wearing?: Pull over shirt    Upper body assist Assist Level: Maximal Assistance - Patient 25 - 49%    Lower Body Dressing/Undressing Lower body dressing      What is the patient  wearing?: Pants     Lower body assist Assist for lower body dressing: Moderate Assistance - Patient 50 - 74%     Toileting Toileting    Toileting assist Assist for toileting: Moderate Assistance - Patient 50 - 74%     Transfers Chair/bed transfer  Transfers assist     Chair/bed transfer assist level: Minimal Assistance - Patient > 75%     Locomotion Ambulation   Ambulation assist      Assist level: Minimal Assistance - Patient > 75% Assistive device: No Device Max distance: 200+   Walk 10 feet activity   Assist     Assist level: Minimal Assistance - Patient > 75% Assistive device: No Device   Walk 50 feet activity   Assist     Assist level: Minimal Assistance - Patient > 75% Assistive device: No Device    Walk 150 feet activity   Assist    Assist level: Minimal Assistance - Patient > 75% Assistive device: No Device    Walk 10 feet on uneven surface  activity   Assist     Assist level: Minimal Assistance - Patient > 75% Assistive device: Other (comment) (no AD)   Wheelchair     Assist Is the patient using a wheelchair?: Yes Type of Wheelchair: Manual    Wheelchair assist level: Supervision/Verbal cueing Max wheelchair distance: >200'    Wheelchair 50 feet with 2 turns activity    Assist        Assist Level: Supervision/Verbal cueing   Wheelchair 150 feet activity     Assist      Assist Level: Supervision/Verbal cueing   Blood pressure 106/75, pulse 76, temperature 98.8 F (37.1 C), temperature source Oral, resp. rate 16, height 6\' 3"  (1.905 m), weight 107.5 kg, SpO2 100 %.  Medical Problem List and Plan: 1. 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  Antithrombotics: -DVT/anticoagulation:  Mechanical: Antiembolism stockings, thigh (TED hose) Bilateral lower extremities             -antiplatelet therapy: Aspirin 81 mg daily and Plavix 75 mg daily 3. Pain Management: Tylenol as needed 4. Mood/Behavior/Sleep: Provide emotional support             -antipsychotic agents: N/A 5. Neuropsych/cognition: This patient is not capable of making decisions on his own behalf. 6. Skin/Wound Care: Routine skin checks 7. Fluids/Electrolytes/Nutrition: Routine in and outs with follow-up chemistries 8.  CAD/history of PCI/chronic systolic congestive heart failure with ejection fraction 20 to 25%.  Daily weights. TEE without thrombus.  Monitor for any signs of fluid overload.  Continue Farxiga 10 mg daily.  Continue  asa and plavix.   Follow-up outpatient cardiology services -continue Holter monitor, called company as holter monitor blinking red, called company- noted to be bad contact with skin, contact improved and it turned green, continue to monitor  Filed Weights   10/09/22 1723 10/10/22 0344 10/13/22 0500  Weight: 107.8 kg 105.9 kg 107.5 kg   Weights stable  9.  Hypertension.  Lisinopril 2.5 mg daily, Toprol-XL 12.5 mg daily.   Monitor with increased mobility  -12/19 well controlled, continue current medications  10/13/2022    7:50 PM 10/13/2022    2:09 PM 10/13/2022    8:32 AM  Vitals with BMI  Systolic 106 117 376  Diastolic 75 79 76  Pulse 76 72 76    10.  Hyperlipidemia.  Lipitor 80mg  daily. Heart healthy diet 11.  History of tobacco as well as polysubstance use.  Urine drug screen positive marijuana as well as cocaine.  Provide counseling 12.  Obesity.  BMI 30.70.  Dietary follow-up 13. Leukocytosis  -WNL 12/18      LOS: 5 days A FACE TO FACE EVALUATION WAS PERFORMED  1/19 10/14/2022, 12:56 PM

## 2022-10-14 NOTE — Progress Notes (Signed)
Occupational Therapy Session Note  Patient Details  Name: Ricardo Yang MRN: [id number] Date of Birth: May 20, 1976  Session 1  Today's Date: 10/14/2022 OT Individual Time: 7412-8786 OT Individual Time Calculation (min): 57 min   Session 2 Today's Date: 10/14/2022 OT Individual Time: 1345-1430 OT Individual Time Calculation (min): 45 min     Short Term Goals: Week 1:  OT Short Term Goal 1 (Week 1): Pt will don shirt with min A OT Short Term Goal 2 (Week 1): Pt will complete LB dressing with min A OT Short Term Goal 3 (Week 1): Pt will complete toilet transfer with min A with LRAD  Skilled Therapeutic Interventions/Progress Updates:    Session 1 Pt received sitting in the recliner with no c/o pain and agreeable to OT session. Pt completed ADLs at shower level. Focus of intervention on forced use and HOH integration of the RUE during bathing and dressing. Min A Ambulatory transfer into the bathroom without a device. He required min cueing to utilize safe technique to doff pants. He sat on the TTB in the walk in shower for shower, sit <> stand for LB bathing. Mod facilitation for RUE to use the wash mit and wash LUE, chest, and legs. Cueing required to reduce trap elevation compensation. He transferred back to the recliner following. He required mod cueing overall and increased time to don a shirt but was able to do so without physical assist! He was guided through bimanual manipulation of self care items including deodorant, requiring min facilitation at the hand for gross grasp and shoulder to apply adequately. He donned pants with min A to pull up on the R hip. He required min cueing to utilize one hand technique to don socks but was able to do so in figure 4 position with (S). Discussed e-stim use with PA and rehab supervisor with heart monitor. Will f/u with cardiologist and/or manufacturer to ensure safety. Pt was left sitting up in the recliner with all needs met, chair alarm set, and call  bell within reach.     Session 2 Pt received sitting in the w/c with his family present. He completed 100 ft of functional mobility to the therapy gym with CGA. Session focused on RUE NMR. Focus on reducing compensatory movements and activation, reducing flexor synergy patterns, and promoting AAROM. Completed standing level gravity eliminated shoulder flexion/extension while holding onto a rail to promote high repetitions and maintain gross grasp. Pt then transferred to the mat and into quadruped with min A. Min-mod facilitation provided at the shoulder and wrist to weight bear through extended arm with pt lifting the LUE - he was able to maintain arm extension with only min facilitation. Pt reporting pain in his R knee so therefore only completed several minutes before transitioning into supine on the mat. He worked on closed and open chain chest press with focus on arm adduction to reduce abduction compensatory pattern. He was able to complete 2x10 repetitions with hands on facilitation. Also worked on Charity fundraiser with mod facilitation to provide input to scapula to reduce risk of impingement. He returned to his room and was left sitting up in the recliner with all needs met, chair alarm set, and call bell within reach.     Therapy Documentation Precautions:  Precautions Precautions: Fall Type of Shoulder Precautions: R hemi UE>LE Restrictions Weight Bearing Restrictions: No   Therapy/Group: Individual Therapy  [name] 10/14/2022, 11:32 AM

## 2022-10-14 NOTE — Progress Notes (Signed)
Speech Language Pathology Daily Session Note  Patient Details  Name: Ricardo Yang MRN: [id number] Date of Birth: [birth year]  Today's Date: 10/14/2022 SLP Individual Time: 1102-1200 SLP Individual Time Calculation (min): 58 min  Short Term Goals: Week 1: SLP Short Term Goal 1 (Week 1): Pt will demonstrate use of word finding strategies in 75% of opportunities during 10-15 minute conversational exchange with SLP, given sup A verbal cues SLP Short Term Goal 2 (Week 1): Pt will self monitor and repair communication breakdowns attributed to reduced articulatory precision by implementing speech strategies (slow down, repeat) at the mod I level SLP Short Term Goal 3 (Week 1): Pt will write functional information at the word and short-sentence level with 80% accuracy with sup A verbal cues  Skilled Therapeutic Interventions: Pt seen for skilled ST with focus on communication goals, pt in recliner and agreeable to therapeutic tasks. Pt endorses decreased frequency of speech difficulties, however continues to report frustration and feeling like "I have the word in my head by I can't get it out". SLP reviewed word finding strategies to utilize during these episodes which patient demonstrates teach back during session. During both structured and non-structured speech tasks, pt able to self-monitor communication break downs on ~90% of occasions. Pt demonstrates Mod I with repairing breakdowns with strategies including slow rate and repetition of errored word/phrase/sentence. Pt left in recliner with all needs met, cont ST POC.   Pain Pain Assessment Pain Scale: 0-10 Pain Score: 0-No pain  Therapy/Group: Individual Therapy  Dewaine Conger 10/14/2022, 11:53 AM

## 2022-10-14 NOTE — Progress Notes (Signed)
Occupational Therapy Session Note  Patient Details  Name: Ricardo Yang MRN: [id number] Date of Birth: February 18, 1976  Today's Date: 10/14/2022 OT Individual Time: 1300-1343 OT Individual Time Calculation (min): 43 min    Short Term Goals: Week 1:  OT Short Term Goal 1 (Week 1): Pt will don shirt with min A OT Short Term Goal 2 (Week 1): Pt will complete LB dressing with min A OT Short Term Goal 3 (Week 1): Pt will complete toilet transfer with min A with LRAD  Skilled Therapeutic Interventions/Progress Updates:    Pt greeted sitting in recliner with mother and gf present, agreeable to OT treatment session. Family requesting pt shave. Pt ambulated to he sink without AD and min A. [employer] weight bearing through R UE while shaving at the sink for neuro re-ed. Pt tolerated standing for 15 minutes while he shaved with electric trimmers and trimmed up facial hair. Pt then sat while mother trimmed up his hair. While mother trimming hair, pt worked on R UE grip strength with blue foam cube. Pt completed 3 sets of 10 squeezes with great activation in hand, but unable to release. OT assist to work on relax and extension of fingers. Pt then worked on isolated wrist extension, elbow flexion, and scapular retraction. Pt left seated in wc at end of session with family present and needs met awaiting next therapy session.   Therapy Documentation Precautions:  Precautions Precautions: Fall Type of Shoulder Precautions: R hemi UE>LE Restrictions Weight Bearing Restrictions: No Pain: Pain Assessment Pain Scale: 0-10 Pain Score: 0-No pain   Therapy/Group: Individual Therapy  Valma Cava 10/14/2022, 1:43 PM

## 2022-10-14 NOTE — Progress Notes (Signed)
Physical Therapy Session Note  Patient Details  Name: Ricardo Yang MRN: [id number] Date of Birth: 1975-12-16  Today's Date: 10/14/2022 PT Individual Time: 1500-1540 PT Individual Time Calculation (min): 40 min   Short Term Goals: Week 1:  PT Short Term Goal 1 (Week 1): Patient will ambulate >43' with LRAD and CGA PT Short Term Goal 2 (Week 1): Patient will ascend/descend 12 steps with L HR and CGA PT Short Term Goal 3 (Week 1): Patient will performed stand pivot transfer with LRAD and CGA  Skilled Therapeutic Interventions/Progress Updates:      Therapy Documentation Precautions:  Precautions Precautions: Fall Type of Shoulder Precautions: R hemi UE>LE Restrictions Weight Bearing Restrictions: No   Pt agreeable to make up missed minutes with additional PT session. Pt without verbal complaints of pain during session. Pt CGA with intermittent mod A (right lateral loss of balance) for gait > 500 ft with no AD. Pt performed following activities during gait to address divided attention deficits:   -lateral head turns  -reading note card with list of colors   -reading note card with list of colors while performing lateral head turns  -lateral side stepping with B UE hand held support from PT for balance  -carioca/grapevine   Pt presents with decreased gait speed and able to increase safely with verbal cues for pacing. Pt ambulated to room and left seated in recliner at bedside with chair alarm on and all needs within reach.     Therapy/Group: Individual Therapy  Truitt Leep Truitt Leep PT, DPT  10/14/2022, 4:05 PM

## 2022-10-14 NOTE — Progress Notes (Signed)
Physical Therapy Session Note  Patient Details  Name: Ricardo Yang MRN: [id number] Date of Birth: Jul 19, 1976  Today's Date: 10/14/2022 PT Individual Time: 6759-1638 PT Individual Time Calculation (min): 45 min   Short Term Goals: Week 1:  PT Short Term Goal 1 (Week 1): Patient will ambulate >1' with LRAD and CGA PT Short Term Goal 2 (Week 1): Patient will ascend/descend 12 steps with L HR and CGA PT Short Term Goal 3 (Week 1): Patient will performed stand pivot transfer with LRAD and CGA  Skilled Therapeutic Interventions/Progress Updates: Pt presents semi-reclined in bed and agreeable to therapy.  Pt transfers sup to sit using siderails and supervision.  Pt given socks and able to don R sock in [place] position, but required mod A for starting L sock and then pt finishes.  No LOB w/ activity.  Pt requires CGA for sit to stand although increased time from lowered bed.  Pt amb multiple trials w/o AD but min to CGA at trunk 200'+.  Pt w/ inattention to objects to right (pt states he was doing this before?).  Pt educated on visual scanning, and for increased gait speed to decrease LOB when in SLS.  Pt amb through turns w/ occasional scissoring gait.  Pt performed standing marching 3 x 10.  Pt performed amb marching w/ opposite UE exaggerated swing (pt had been exaggerating swing w/ LUE prior, so added RUE).  Pt performed multiple quick stops/starts, backward walking w/ cues for improved balance. Pt negotiated ramped surface w/ min to CGA for safety.  Pt amb w/ cognitive aspect added to challenge balance  (how many pink tags on wall to Right, how many pictures on wall).  Pt returned to room and remained sitting in recliner w/ chair alarm on and all needs in reach.     Therapy Documentation Precautions:  Precautions Precautions: Fall Type of Shoulder Precautions: R hemi UE>LE Restrictions Weight Bearing Restrictions: No General:   Vital Signs:   Pain:0/10 Pain Assessment Pain Scale:  0-10 Pain Score: 0-No pain Mobility:     Therapy/Group: Individual Therapy  Lucio Edward 10/14/2022, 12:56 PM

## 2022-10-15 DIAGNOSIS — F1911 Other psychoactive substance abuse, in remission: Secondary | ICD-10-CM

## 2022-10-15 MED ORDER — SORBITOL 70 % SOLN
60.0000 mL | Freq: Once | Status: AC
  Administered 2022-10-15: 60 mL via ORAL
  Filled 2022-10-15: qty 60

## 2022-10-15 NOTE — Patient Care Conference (Signed)
Inpatient RehabilitationTeam Conference and Plan of Care Update Date: 10/15/2022   Time: 12:04 PM    Patient Name: Ricardo Yang      Medical Record Number: [id number]  Date of Birth: 08/21/1976 Sex: Male         Room/Bed: 4M03C/4M03C-01 Payor Info: Payor: /    Admit Date/Time:  10/09/2022  4:21 PM  Primary Diagnosis:  Left middle cerebral artery stroke [facility])  Hospital Problems: Principal Problem:   Left middle cerebral artery stroke [facility])    Expected Discharge Date: Expected Discharge Date: 10/23/22  Team Members Present: Physician leading conference: Dr. Fanny Dance Social Worker Present: Dossie Der, LCSW Nurse Present: Chana Bode, RN PT Present: Raechel Chute, PT OT Present: Jake Shark, OT SLP Present: Feliberto Gottron, SLP PPS Coordinator present : Fae Pippin, SLP     Current Status/Progress Goal Weekly Team Focus  Bowel/Bladder   Bladder continent. Able to void on routine basis. Bowel elimination difficulty having bowel movement last bowel movement 6 days ago (10/09/22). Increase in flatulence.   Continue to maintain bladder continence. Restore routine bowel elimination.   Continue to assist patient with tolieting needs QShift and as needed.    Swallow/Nutrition/ Hydration               ADL's   Min A UB/LB ADLs, cueing for hemi technique, RUE with great return but with flexor synergy patterns and delayed response- up to 85 degrees of shoulder flexion, Min A to CGA transfers   supervision overall   RUE NMR, dynamic balance, safety, ADLs, transfers    Mobility   CGA transfers, gait > 300 ft no AD, intermittent min/mod LOB, min steps x 12   Mod I/ Supervision  R NMR, gait, coordination/balance, high level gait training    Communication   Supervision-Mod I for use of word-finding strategies   Mod I   use of word-finding strategies at the conversation level    Safety/Cognition/ Behavioral Observations               Pain   Has not endorsed  any pain during admission.   Continue with pain level at 0.   Assess pain QShift and PRN.    Skin                 Discharge Planning:  Home with fiance and Mom providing 24/7 supervision. Pt is doing well and making good progress.   Team Discussion: Patient with cardiac monitor through 10/22/22 post left MCA CVA; needs cues for hemi techniques due to strong flexor synergy syndrome and word finding difficulties with speech disfluency.  Patient on target to meet rehab goals: yes, currently needs min assist for ADLs. Needs CGA/intermittent min assist for transfers and able to ambulate up to 300' without an assistive device.  Able to manage 12 steps with min assist and needs supervison - mod I for speech strategies. Goals for discharge set for supervision overall and min assist for transfers.   *See Care Plan and progress notes for long and short-term goals.   Revisions to Treatment Plan:  N/a   Teaching Needs: Safety, cardiac monitor wear/care, medications, transfers, etc.  Current Barriers to Discharge: Decreased caregiver support, Insurance for SNF coverage, and lack of insurance for follow up services  Possible Resolutions to Barriers: Family education OP follow up services recommended; referral to [facility] Summary Current Status: MCA CVA, CAD, HTN, HLD, constipation, CHF  Barriers to Discharge: Cardiac Complications;Self-care  education  Barriers to Discharge Comments: MCA CVA, CAD, HTN, HLD, constipation, CHF Possible Resolutions to [employer] Focus: monitor wt, follow BP, sorbitol ordered   Continued Need for Acute Rehabilitation Level of Care: The patient requires daily medical management by a physician with specialized training in physical medicine and rehabilitation for the following reasons: Direction of a multidisciplinary physical rehabilitation program to maximize functional independence : Yes Medical management of patient stability  for increased activity during participation in an intensive rehabilitation regime.: Yes Analysis of laboratory values and/or radiology reports with any subsequent need for medication adjustment and/or medical intervention. : Yes   I attest that I was present, lead the team conference, and concur with the assessment and plan of the team.   Chana Bode B 10/15/2022, 3:14 PM

## 2022-10-15 NOTE — Progress Notes (Signed)
Physical Therapy Session Note  Patient Details  Name: Ricardo Yang MRN: [id number] Date of Birth: 31-Mar-1976  Today's Date: 10/15/2022 PT Individual Time: 9702-6378 PT Individual Time Calculation (min): 27 min   Short Term Goals: Week 1:  PT Short Term Goal 1 (Week 1): Patient will ambulate >31' with LRAD and CGA PT Short Term Goal 2 (Week 1): Patient will ascend/descend 12 steps with L HR and CGA PT Short Term Goal 3 (Week 1): Patient will performed stand pivot transfer with LRAD and CGA  Skilled Therapeutic Interventions/Progress Updates: Pt presented in recliner agreeable to therapy. Pt denies pain throughout session. Pt stood with close supervision and ambulated without AD and CGA to main gym ~153f. Pt noted to hit doorframe when exiting room due to decreased awareness. Pt participated in scanning activity ambulating in hallway obtaining number dots 1-12 in sequential order. Pt noted to scan L/R and was able to recall location of some numbers. Pt ambulated at slow consistent pace throughout activity. Pt also participated in ascending/descending x 12 steps with R rail only and CGA fading to supervision. Pt was able to successfully perform with step through pattern and maintained fair safety. Pt ambulated back to room CGA with intermittent close supervision and returned to recliner at end of session. Pt left in recliner with call bell within reach and needs met.      Therapy Documentation Precautions:  Precautions Precautions: Fall Type of Shoulder Precautions: R hemi UE>LE Restrictions Weight Bearing Restrictions: No RUE Weight Bearing: Non weight bearing General:   Vital Signs:   Pain:   Mobility:   Locomotion :    Trunk/Postural Assessment :    Balance:   Exercises:   Other Treatments:      Therapy/Group: Individual Therapy  Noele Icenhour 10/15/2022, 3:42 PM

## 2022-10-15 NOTE — Progress Notes (Signed)
Speech Language Pathology Daily Session Note  Patient Details  Name: Ricardo Yang MRN: [id number] Date of Birth: 1976-05-03  Today's Date: 10/15/2022 SLP Individual Time: 5916-3846 SLP Individual Time Calculation (min): 43 min  Short Term Goals: Week 1: SLP Short Term Goal 1 (Week 1): Pt will demonstrate use of word finding strategies in 75% of opportunities during 10-15 minute conversational exchange with SLP, given sup A verbal cues SLP Short Term Goal 2 (Week 1): Pt will self monitor and repair communication breakdowns attributed to reduced articulatory precision by implementing speech strategies (slow down, repeat) at the mod I level SLP Short Term Goal 3 (Week 1): Pt will write functional information at the word and short-sentence level with 80% accuracy with sup A verbal cues  Skilled Therapeutic Interventions:   Pt seen for SLP session to address expressive language goals and writing goals.  Given prompt, pt reviewed word-finding strategies discussed in previous session. He reported independently implementing word-finding strategies in conversations with his care team. Observed intermittent brief anomic periods and mis-articulations throughout session; however, pt was able to repair each communication breakdown independently.   Structured verbal expressive language task facilitated with version of "Headbands" game. Pt able to describe prompted words with >90% accuracy so that SLP could accurately determine the target. Occasional cues needed for patient to offer further descriptions, which he was able to do. With roles reversed, pt able to determine 100% of targeted words based on SLP's description.   Structured writing task of biographical information and random words and sentences prompted. Pt required use of non-dominant hand for writing which mildly impacted legibility of words and sentences. No language deficit observed with writing task today.   Left patient upright in reclined with  alarm on and all needs in reach. RN updated. Continue with current plan of care.   Pain Pain Assessment Pain Scale: 0-10 Pain Score: 0-No pain Faces Pain Scale: No hurt  Therapy/Group: Individual Therapy  Ellery Plunk 10/15/2022, 11:20 AM

## 2022-10-15 NOTE — Progress Notes (Signed)
Occupational Therapy Session Note  Patient Details  Name: Ricardo Yang MRN: [id number] Date of Birth: 08-16-1976  Today's Date: 10/15/2022 OT Individual Time: 0722-5750 OT Individual Time Calculation (min): 70 min    Short Term Goals: Week 1:  OT Short Term Goal 1 (Week 1): Pt will don shirt with min A OT Short Term Goal 2 (Week 1): Pt will complete LB dressing with min A OT Short Term Goal 3 (Week 1): Pt will complete toilet transfer with min A with LRAD  Skilled Therapeutic Interventions/Progress Updates:    Pt received supine with no c/o pain, agreeable to OT session. He declined ADLs. He completed 100 ft of functional mobility to the tub room. He completed a tub transfer using a TTB with close CGA. He already has this equipment at home and is comfortable with use. He then completed 2x3 min UE ergometer using his RUE only. Hands on facilitation required the entire time to maintain gross grasp but pt able to complete revolutions with shoulder/elbow flexion and then extension. Considerable fatigue but great high repetition movement. He then completed 150 ft of functional mobility with CGA to the other therapy gym. [place] mobile arm support used to facilitate forward reaching with cueing and hands on facilitation required to reduce shoulder abduction compensation. He worked on gross Mining engineer, with OT facilitating tenodesis grasp and gross finger extension. He was able to maintain grasp on horseshoes while OT facilitated forward flexion and extension. He then stood and faced the mat, completing forward leaning BUE closed chain elbow flexion/ext to increase weight bearing through the RUE to promote [place] joint stability. He returned to his room. Pt was left sitting up in the recliner with all needs met, chair alarm set, and call bell within reach.     Therapy Documentation Precautions:  Precautions Precautions: Fall Type of Shoulder Precautions: R hemi UE>LE Restrictions Weight  Bearing Restrictions: No RUE Weight Bearing: Non weight bearing  Therapy/Group: Individual Therapy  [name] 10/15/2022, 6:41 AM

## 2022-10-15 NOTE — Progress Notes (Addendum)
PROGRESS NOTE   Subjective/Complaints: Working in the gym with therapy. No new concerns elicited.     Review of Systems  Constitutional:  Negative for fever.  HENT:  Negative for congestion.   Respiratory:  Negative for shortness of breath.   Cardiovascular:  Negative for chest pain.  Gastrointestinal:  Positive for constipation. Negative for abdominal pain, nausea and vomiting.  Genitourinary: Negative.   Skin:  Negative for rash.  Neurological:  Positive for sensory change and focal weakness. Negative for weakness.  Psychiatric/Behavioral:  Negative for depression. The patient has insomnia.     Objective:   No results found. Recent Labs    10/13/22 0709  WBC 6.8  HGB 16.3  HCT 48.4  PLT 274    Recent Labs    10/13/22 0709  NA 136  K 3.8  CL 103  CO2 25  GLUCOSE 89  BUN 15  CREATININE 0.91  CALCIUM 9.2     Intake/Output Summary (Last 24 hours) at 10/15/2022 0946 Last data filed at 10/15/2022 0758 Gross per 24 hour  Intake 708 ml  Output 175 ml  Net 533 ml         Physical Exam: Vital Signs Blood pressure 124/88, pulse 74, temperature 97.9 F (36.6 C), temperature source Oral, resp. rate 18, height _0  (1.905 m), weight 107.5 kg, SpO2 100 %.   General: NAD, working with  therapy in gym HEENT: Head is normocephalic, atraumatic, PERRLA,conjugate gaze, oral mucosa pink and moist,  Neck: Supple without JVD or lymphadenopathy Heart: Reg rate and rhythm. No murmurs rubs or gallops, holter monitor in place Chest: CTA bilaterally without wheezes, rales, or rhonchi; no distress Holter monitor in place - green light flashing Abdomen: Soft, non-tender, non-distended, bowel sounds positive. Extremities: No clubbing, cyanosis, or edema. Pulses are 2+ Psych: Pt's affect is appropriate. Pt is cooperative. pleasant Skin: warm and dry Neuro:  alert and oriented x4, follows commands, CN 2-12 grossly  intact, Mild aphasia. Fair judgement and insight.  Moving all 4 extremities Musculoskeletal: NO joint swelling or tenderness noted, decreased tone RUE  Prior exam  Strength 5/5 in all LUE and LLE Strength 2-3/5 in RUE shoulder abduction, elbow extension and flexion, finger flexion Strength 4/5 proximal  5/5 distal RLE Sensation intact in all 4 extremities but decreased in RUE  Assessment/Plan: 1. Functional deficits which require 3+ hours per day of interdisciplinary therapy in a comprehensive inpatient rehab setting. Physiatrist is providing close team supervision and 24 hour management of active medical problems listed below. Physiatrist and rehab team continue to assess barriers to discharge/monitor patient progress toward functional and medical goals  Care Tool:  Bathing    Body parts bathed by patient: Right arm, Chest, Abdomen, Front perineal area, Buttocks, Right upper leg, Left upper leg, Right lower leg, Left lower leg, Face, Left arm   Body parts bathed by helper: Left arm     Bathing assist Assist Level: Minimal Assistance - Patient > 75%     Upper Body Dressing/Undressing Upper body dressing   What is the patient wearing?: Pull over shirt    Upper body assist Assist Level: Minimal Assistance - Patient > 75%  Lower Body Dressing/Undressing Lower body dressing      What is the patient wearing?: Pants     Lower body assist Assist for lower body dressing: Minimal Assistance - Patient > 75%     Toileting Toileting    Toileting assist Assist for toileting: Minimal Assistance - Patient > 75%     Transfers Chair/bed transfer  Transfers assist     Chair/bed transfer assist level: Contact Guard/Touching assist     Locomotion Ambulation   Ambulation assist      Assist level: Minimal Assistance - Patient > 75% Assistive device: No Device Max distance: 200+   Walk 10 feet activity   Assist     Assist level: Minimal Assistance - Patient >  75% Assistive device: No Device   Walk 50 feet activity   Assist    Assist level: Minimal Assistance - Patient > 75% Assistive device: No Device    Walk 150 feet activity   Assist    Assist level: Minimal Assistance - Patient > 75% Assistive device: No Device    Walk 10 feet on uneven surface  activity   Assist     Assist level: Minimal Assistance - Patient > 75% Assistive device: Other (comment) (no AD)   Wheelchair     Assist Is the patient using a wheelchair?: Yes Type of Wheelchair: Manual    Wheelchair assist level: Supervision/Verbal cueing Max wheelchair distance: >200'    Wheelchair 50 feet with 2 turns activity    Assist        Assist Level: Supervision/Verbal cueing   Wheelchair 150 feet activity     Assist      Assist Level: Supervision/Verbal cueing   Blood pressure 124/88, pulse 74, temperature 97.9 F (36.6 C), temperature source Oral, resp. rate 18, height _0  (1.905 m), weight 107.5 kg, SpO2 100 %.  Medical Problem List and Plan: 1. 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 Formulized individual treatment plan based on medical history, underlying problem and comorbidities.   2.  Antithrombotics: -DVT/anticoagulation:  Mechanical: Antiembolism stockings, thigh (TED hose) Bilateral lower extremities             -antiplatelet therapy: Aspirin 81 mg daily and Plavix 75 mg daily 3. Pain Management: Tylenol as needed 4. Mood/Behavior/Sleep: Provide emotional support             -antipsychotic agents: N/A 5. Neuropsych/cognition: This patient is not capable of making decisions on his own behalf. 6. Skin/Wound Care:  Routine skin checks 7. Fluids/Electrolytes/Nutrition: Routine in and outs with follow-up chemistries 8.  CAD/history of PCI/chronic systolic congestive heart failure with ejection fraction 20 to 25%.  Daily weights. TEE without thrombus.  Monitor for any signs of fluid overload.  Continue Farxiga 10 mg daily.  Continue asa and plavix.   Follow-up outpatient cardiology services -continue Holter monitor, called company as holter monitor blinking red, called company- noted to be bad contact with skin, contact improved and it turned green, continue to monitor  Filed Weights   10/09/22 1723 10/10/22 0344  10/13/22 0500  Weight: 107.8 kg 105.9 kg 107.5 kg  Weight continues to be stable today Denies chest pain  9.  Hypertension.  Lisinopril 2.5 mg daily, Toprol-XL 12.5 mg daily.   Monitor with increased mobility      10/15/2022    9:19 AM 10/15/2022    4:23 AM 10/14/2022    7:50 PM  Vitals with BMI  Systolic 483 97 032  Diastolic 88 67 78  Pulse 74 64 74  BP well controlled, continue to monitor  10.  Hyperlipidemia.  Lipitor 58m daily. Heart healthy diet 11.  History of tobacco as well as polysubstance use.  Urine drug screen positive marijuana as well as cocaine.  Provide counseling 12.  Obesity.  BMI 30.70.  Dietary follow-up 13. Leukocytosis  -WNL 12/18 14. Constipation  -Sorbitol ordered      LOS: 6 days A FACE TO FACE EVALUATION WAS PERFORMED  YJennye Boroughs12/20/2023, 9:46 AM

## 2022-10-15 NOTE — Progress Notes (Signed)
Patient ID: Ricardo Yang, male   DOB: 1976/09/27, 46 y.o.   MRN: [id number]  Met with pt to give team conference update goals of supervision level and target discharge ate of 12/28. He is glad to be doing well and has a discharge date now. He is working hard in therapies, will refer to [facility] due to uninsured for follow up. No equipment needs due to has at home. Continue to work on discharge needs.

## 2022-10-15 NOTE — Consult Note (Signed)
Neuropsychological Consultation   Patient:   Ricardo Yang   DOB:   December 16, 1975  MR Number:  [id number]  Location:  [facility] [address] CENTER [id number] [place] [address] Dept: [phone] Loc: [phone]           Date of Service:   10/15/2022  Start Time:   2 PM End Time:   3 PM  Provider/Observer:  Arley Phenix, Psy.D.       Clinical Neuropsychologist       Billing Code/Service: [phone]  Reason for Service:    Ricardo L Batz is a 46 year old male referred for neuropsychological consultation due to coping and adjustment issues during his admission in the comprehensive inpatient rehabilitation unit.  The patient presented on 10/04/2022 with acute onset of aphasia and right-sided weakness and leftward gaze.  Cranial CT scan showed no acute changes but MRI showed acute infarcts in the left ACA, left MCA and likely of the right PCA territories with a large infarct involving the left postcentral gyrus.  Patient did not receive tPA.  Patient has a past medical history that includes previous CVA with no residual deficits, CAD with anterior STEMI in 2020 and non-STEMI in 2022 status post PCI with complications of ischemic cardiomyopathy.  Patient has been maintained on aspirin and Plavix.  Patient was positive for cocaine on current presentation and also tested positive for marijuana.  Alcohol was negative.  Patient admits to episodic cocaine use and other drug use including ecstasy marijuana and cocaine.  Patient reports that he had some stressors going on in his life as well as boredom and lack of motivation and used cocaine close to the time he had the most recent stroke.  Patient verbalized awareness to some degree that his cocaine use may have played a role in his most recent stroke and even potential prior events.  Patient had significant aphasia initially which has improved with some ongoing word finding issues.   Patient continues with right-sided weakness particularly his right hand and arm which is his dominant hand.  Patient denies significant depression or history of depression.   Below is the HPI for the current admission for convenience:  HPI: Ricardo L. Ovens is a 46 year old right-handed male with history of reported CVA in the past with no deficits, CAD with anterior STEMI 07/2019, non-STEMI 03/2021/status post PCI with complications of ischemic cardiomyopathy ejection fraction 25 to 30% from an echocardiogram 6/23 maintained on aspirin and Plavix last seen by cardiology services Dr.Paraschos 2022, hypertension maintained on lisinopril 2.5 mg daily, Toprol XL 25 mg daily, tobacco use as well as polysubstance abuse, obesity with BMI 30.70.  Cocaine positive on presentation.  Per chart review patient lives with significant other.  1 level home with ramped entrance.  Reportedly independent prior to admission.  Presented 10/04/2022 with acute onset of aphasia and right-sided weakness and leftward gaze.  Cranial CT scan with no acute changes.  CT angiogram head and neck no large vessel occlusion.  No atherosclerosis, stenosis or arterial abnormality identified in the head or neck.  MRI showed acute infarcts in the left ACA, left MCA and likely of the right PCA territory with a large infarct involving the left postcentral gyrus.  Patient did not receive tPA.  Admission chemistries unremarkable except WBC 11,300, alcohol negative, urine drug screen positive for cocaine as well as marijuana, troponin 265-275.  Echocardiogram with ejection fraction less than 20%.  The left ventricle showed  severely decreased function as well as global hypokinesis.  TEE completed by cardiology services showing severely reduced systolic function and no mural apical thrombus with estimated ejection fraction 20-25%.  Bubble study was negative.  No PFO noted.  Elevated troponin felt to be related to demand ischemia.  Patient remains on Plavix  and aspirin as prior to admission.  Permissive hypertension with low-dose lisinopril as well as Toprol resumed.  Tolerating a regular consistency diet.  He reports his strength is gradually improving. Therapy evaluations completed due to patient's aphasia decreased functional mobility and right side weakness was admitted for a comprehensive rehab program.   Current Status:  Patient was awake and alert and oriented to his statusOn the inpatient unit receiving rehabilitative efforts.  Patient was in good spirits and awake and alert.  He was open and frank about his substance use in the past as well as his desire to stay away from substance abuse in the future.  Patient did not feel like he would need any type of substance abuse programs.  Patient was open and forthright about events in his life but somewhat vague as far as his understanding of the relationship to substance use and potential triggers for cardiovascular and cerebrovascular events.  Patient admitted that he was not always compliant with anticoagulation efforts.  Behavioral Observation: Ricardo L Pasch  presents as a 46 y.o.-year-old Right handed African American Male who appeared his stated age. his dress was Appropriate and he was Fairly Groomed and his manners were Appropriate to the situation.  his participation was indicative of Appropriate behaviors.  There were physical disabilities noted.  he displayed an appropriate level of cooperation and motivation.    Interactions:    Active Redirectable  Attention:   abnormal and attention span appeared shorter than expected for age  Memory:   within normal limits; recent and remote memory intact  Visuo-spatial:  not examined  Speech (Volume):  normal  Speech:   normal; some mild word finding issues did appear during our session but his expressive language appears to have improved significantly with some mild residual impacts.  Thought Process:  Coherent and Tangential  Though  Content:  WNL; not suicidal and not homicidal  Orientation:   person, place, time/date, and situation  Judgment:   Fair  Planning:   Poor  Affect:    Appropriate  Mood:    Euthymic  Insight:   Fair  Intelligence:   normal  Substance Use:  There is a documented history of cocaine, ecstasy, and marijuana abuse confirmed by the patient.    Medical History:   Past Medical History:  Diagnosis Date   Multiple sclerosis (HCC)    a. question possible MS   Obesity    Polysubstance abuse (HCC)    a. 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   Encounter for imaging to screen for metal prior to MRI 10/07/2022   HFrEF (heart failure with reduced ejection fraction) (HCC) 10/07/2022   Cerebrovascular accident (CVA) (HCC) 10/07/2022   Cocaine use 10/06/2022   Expressive aphasia 10/05/2022   Right hemiplegia (HCC) 10/05/2022   CAD S/P percutaneous coronary angioplasty 10/05/2022   Primary hypertension 10/05/2022   Acute CVA (cerebrovascular accident) (HCC) 10/04/2022   CAD (coronary artery disease) 10/04/2022   Polysubstance abuse (HCC) 10/04/2022  Essential hypertension 04/22/2021   STEMI (ST elevation myocardial infarction) (HCC) 08/06/2019   Acute ST elevation myocardial infarction (STEMI) involving left anterior descending (LAD) coronary artery (HCC) 08/06/2019   Chest pain    NSTEMI (non-ST elevated myocardial infarction) (HCC) 05/25/2015   Elevated troponin    Ischemic chest pain (HCC)    Cocaine abuse (HCC)    Heavy smoker     Psychiatric History:   no prior psychiatric history noted other than his history of substance abuse.  Family Med/Psych History:  Family History  Problem Relation Age of Onset   Heart disease Father     Impression/DX:  Ricardo L Carrigan is a 46 year old male  referred for neuropsychological consultation due to coping and adjustment issues during his admission in the comprehensive inpatient rehabilitation unit.  The patient presented on 10/04/2022 with acute onset of aphasia and right-sided weakness and leftward gaze.  Cranial CT scan showed no acute changes but MRI showed acute infarcts in the left ACA, left MCA and likely of the right PCA territories with a large infarct involving the left postcentral gyrus.  Patient did not receive tPA.  Patient has a past medical history that includes previous CVA with no residual deficits, CAD with anterior STEMI in 2020 and non-STEMI in 2022 status post PCI with complications of ischemic cardiomyopathy.  Patient has been maintained on aspirin and Plavix.  Patient was positive for cocaine on current presentation and also tested positive for marijuana.  Alcohol was negative.  Patient admits to episodic cocaine use and other drug use including ecstasy marijuana and cocaine.  Patient reports that he had some stressors going on in his life as well as boredom and lack of motivation and used cocaine close to the time he had the most recent stroke.  Patient verbalized awareness to some degree that his cocaine use may have played a role in his most recent stroke and even potential prior events.  Patient had significant aphasia initially which has improved with some ongoing word finding issues.  Patient continues with right-sided weakness particularly his right hand and arm which is his dominant hand.  Patient denies significant depression or history of depression.  Patient was awake and alert and oriented to his statusOn the inpatient unit receiving rehabilitative efforts.  Patient was in good spirits and awake and alert.  He was open and frank about his substance use in the past as well as his desire to stay away from substance abuse in the future.  Patient did not feel like he would need any type of substance abuse programs.  Patient was  open and forthright about events in his life but somewhat vague as far as his understanding of the relationship to substance use and potential triggers for cardiovascular and cerebrovascular events.  Patient admitted that he was not always compliant with anticoagulation efforts.  Disposition/Plan:  Today we worked on coping and adjustment issues around residual motor deficits for his right arm in particular with some ongoing motor deficits for right leg.  Patient has had significant improvements in expressive language abilities.  We also addressed history of substance use and abuse.  Diagnosis:    Left middle cerebral artery stroke [facility]) - Plan: Ambulatory referral to Neurology         Electronically Signed   _______________________ Arley Phenix, Psy.D. Clinical Neuropsychologist

## 2022-10-15 NOTE — Progress Notes (Signed)
Physical Therapy Session Note  Patient Details  Name: Ricardo Yang MRN: [id number] Date of Birth: December 22, 1975  Today's Date: 10/15/2022 PT Individual Time: 0915-1015 PT Individual Time Calculation (min): 60 min   Short Term Goals: Week 1:  PT Short Term Goal 1 (Week 1): Patient will ambulate >39' with LRAD and CGA PT Short Term Goal 2 (Week 1): Patient will ascend/descend 12 steps with L HR and CGA PT Short Term Goal 3 (Week 1): Patient will performed stand pivot transfer with LRAD and CGA  Skilled Therapeutic Interventions/Progress Updates:      Therapy Documentation Precautions:  Precautions Precautions: Fall Type of Shoulder Precautions: R hemi UE>LE Restrictions Weight Bearing Restrictions: No RUE Weight Bearing: Non weight bearing  Pt agreeable to PT session with emphasis on closed chain UE/LE strengthening to increase muscle activation. Pt CGA with STS and gait ~50 ft to ortho gym with no AD. Pt performed modified wall push up's 2 x 6 with min A for R UE hand stability. PT progressed exercise to gravity resisted push up's from bench positioned on mat table 1 x 12. Pt performed forearm push up's (dolphin press ups) 1 x 6 with tactile cues to increase per-scapular activation. Pt performed left forward lunges with B UE support on mat with minimal PT assist to maintain R UE contact with mat 1 x 6. Pt ambulated to room SBA with no AD and left seated in recliner at bedside with alarm on and all needs within reach.    Therapy/Group: Individual Therapy  Truitt Leep Truitt Leep PT, DPT  10/15/2022, 7:35 AM

## 2022-10-16 DIAGNOSIS — K5900 Constipation, unspecified: Secondary | ICD-10-CM

## 2022-10-16 NOTE — Progress Notes (Signed)
Occupational Therapy Session Note  Patient Details  Name: Ricardo Yang MRN: [id number] Date of Birth: 24-Feb-1976  Today's Date: 10/16/2022 OT Individual Time: 2595-6387 OT Individual Time Calculation (min): 47 min    Short Term Goals: Week 1:  OT Short Term Goal 1 (Week 1): Pt will don shirt with min A OT Short Term Goal 2 (Week 1): Pt will complete LB dressing with min A OT Short Term Goal 3 (Week 1): Pt will complete toilet transfer with min A with LRAD  Skilled Therapeutic Interventions/Progress Updates:    Patient agreeable to participate in OT session. Reports 0/10 pain level.   Patient participated in skilled OT session focusing on RUE NM re-education. Therapist facilitated RUE muscle activation utilizing NMES and AA/ROM movement in order to facilitate greater muscle movement and ability to utilize RUE to complete functional ADL tasks. Heart monitor off during session. NMES performed to right wrist extensors. 19 mHz, 5" on 5" off with 2" ramp up. Pt able to achieve wrist and finger extension with assist of ES. Pt actively performed wrist and finger extension during on ramp followed by rest during off ramp.  Pt education provided on NM re-education and typical pattern of return for UE after CVA. Pt verbalized understanding. Pt educated to complete shoulder shrugs and scapular retraction periodically throughout the day to supplement his therapy sessions. Education provided on NMES home unit with information on purchasing.      Therapy Documentation Precautions:  Precautions Precautions: Fall Type of Shoulder Precautions: R hemi UE>LE Restrictions Weight Bearing Restrictions: No RUE Weight Bearing: Non weight bearing  Therapy/Group: Individual Therapy  Limmie Patricia, OTR/L,CBIS  Supplemental OT - [facility]  10/16/2022, 7:53 AM

## 2022-10-16 NOTE — Progress Notes (Signed)
PROGRESS NOTE   Subjective/Complaints: No new concerns or complaints this AM. Reports good progress with therapy.  LBM yesterday.   Review of Systems  Constitutional:  Negative for fever.  HENT:  Negative for congestion.   Respiratory:  Negative for shortness of breath.   Cardiovascular:  Negative for chest pain.  Gastrointestinal:  Negative for abdominal pain, constipation, nausea and vomiting.  Genitourinary: Negative.   Skin:  Negative for rash.  Neurological:  Positive for sensory change and focal weakness. Negative for weakness.  Psychiatric/Behavioral:  Negative for depression. The patient has insomnia.     Objective:   No results found. No results for input(s): "WBC", "HGB", "HCT", "PLT" in the last 72 hours.  No results for input(s): "NA", "K", "CL", "CO2", "GLUCOSE", "BUN", "CREATININE", "CALCIUM" in the last 72 hours.   Intake/Output Summary (Last 24 hours) at 10/16/2022 1226 Last data filed at 10/16/2022 0836 Gross per 24 hour  Intake 480 ml  Output 600 ml  Net -120 ml         Physical Exam: Vital Signs Blood pressure 116/80, pulse 72, temperature 98.2 F (36.8 C), temperature source Oral, resp. rate 17, height 6\' 3"  (1.905 m), weight 107.5 kg, SpO2 98 %.   General: NAD, sitting in bedside chair HEENT: Head is normocephalic, atraumatic, conjugate gaze, oral mucosa pink and moist,  Neck: Supple without JVD or lymphadenopathy Heart: Reg rate and rhythm. No murmurs rubs or gallops, holter monitor in place Chest: CTA bilaterally without wheezes, rales, or rhonchi; good air movement Abdomen: Soft, non-tender, non-distended, bowel sounds positive. Extremities: No clubbing, cyanosis, or edema. Pulses are 2+ Psych: Pt's affect is appropriate. Pt is cooperative. pleasant Skin: warm and dry Neuro:  alert and oriented x4, follows commands, CN 2-12 grossly intact, Mild aphasia. Fair judgement and insight.   Moving all 4 extremities Musculoskeletal: NO joint swelling or tenderness noted, decreased tone RUE  Prior exam  Strength 5/5 in all LUE and LLE Strength 2-3/5 in RUE shoulder abduction, elbow extension and flexion, finger flexion Strength 4/5 proximal  5/5 distal RLE Sensation intact in all 4 extremities but decreased in RUE  Assessment/Plan: 1. Functional deficits which require 3+ hours per day of interdisciplinary therapy in a comprehensive inpatient rehab setting. Physiatrist is providing close team supervision and 24 hour management of active medical problems listed below. Physiatrist and rehab team continue to assess barriers to discharge/monitor patient progress toward functional and medical goals  Care Tool:  Bathing    Body parts bathed by patient: Right arm, Chest, Abdomen, Front perineal area, Buttocks, Right upper leg, Left upper leg, Right lower leg, Left lower leg, Face, Left arm   Body parts bathed by helper: Left arm     Bathing assist Assist Level: Minimal Assistance - Patient > 75%     Upper Body Dressing/Undressing Upper body dressing   What is the patient wearing?: Pull over shirt    Upper body assist Assist Level: Minimal Assistance - Patient > 75%    Lower Body Dressing/Undressing Lower body dressing      What is the patient wearing?: Pants     Lower body assist Assist for lower body dressing: Minimal Assistance -  Patient > 75%     Editor, commissioning assist Assist for toileting: Minimal Assistance - Patient > 75%     Transfers Chair/bed transfer  Transfers assist     Chair/bed transfer assist level: Contact Guard/Touching assist     Locomotion Ambulation   Ambulation assist      Assist level: Minimal Assistance - Patient > 75% Assistive device: No Device Max distance: 200+   Walk 10 feet activity   Assist     Assist level: Minimal Assistance - Patient > 75% Assistive device: No Device   Walk 50 feet  activity   Assist    Assist level: Minimal Assistance - Patient > 75% Assistive device: No Device    Walk 150 feet activity   Assist    Assist level: Minimal Assistance - Patient > 75% Assistive device: No Device    Walk 10 feet on uneven surface  activity   Assist     Assist level: Minimal Assistance - Patient > 75% Assistive device: Other (comment) (no AD)   Wheelchair     Assist Is the patient using a wheelchair?: Yes Type of Wheelchair: Manual    Wheelchair assist level: Supervision/Verbal cueing Max wheelchair distance: >200'    Wheelchair 50 feet with 2 turns activity    Assist        Assist Level: Supervision/Verbal cueing   Wheelchair 150 feet activity     Assist      Assist Level: Supervision/Verbal cueing   Blood pressure 116/80, pulse 72, temperature 98.2 F (36.8 C), temperature source Oral, resp. rate 17, height 6\' 3"  (1.905 m), weight 107.5 kg, SpO2 98 %.  Medical Problem List and Plan: 1. 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  Antithrombotics: -DVT/anticoagulation:  Mechanical: Antiembolism stockings, thigh (TED hose) Bilateral lower extremities             -antiplatelet therapy: Aspirin 81 mg daily and Plavix 75 mg daily 3. Pain Management: Tylenol as needed 4. Mood/Behavior/Sleep: Provide emotional support             -antipsychotic agents: N/A 5. Neuropsych/cognition: This patient is not capable of making decisions on his own behalf. 6. Skin/Wound Care: Routine skin checks 7. Fluids/Electrolytes/Nutrition: Routine in and outs with follow-up chemistries 8.  CAD/history of PCI/chronic systolic congestive heart failure with ejection fraction 20 to 25%.  Daily weights. TEE without  thrombus.  Monitor for any signs of fluid overload.  Continue Farxiga 10 mg daily.  Continue asa and plavix.   Follow-up outpatient cardiology services -continue Holter monitor, called company as holter monitor blinking red, called company- noted to be bad contact with skin, contact improved and it turned green, continue to monitor  Filed Weights   10/09/22 1723 10/10/22 0344 10/13/22 0500  Weight: 107.8 kg 105.9 kg 107.5 kg  Weight continues to be stable today Denies chest pain  9.  Hypertension.  Lisinopril 2.5 mg daily, Toprol-XL 12.5 mg daily.   Monitor with increased mobility      10/16/2022    9:21 AM 10/16/2022    5:02 AM 10/15/2022  8:16 PM  Vitals with BMI  Systolic 116 114 854  Diastolic 80 79 65  Pulse 72 69 74  BP well controlled, continue to monitor  10.  Hyperlipidemia.  Lipitor 80mg  daily. Heart healthy diet 11.  History of tobacco as well as polysubstance use.  Urine drug screen positive marijuana as well as cocaine.  Provide counseling 12.  Obesity.  BMI 30.70.  Dietary follow-up 13. Leukocytosis  -WNL 12/18 14. Constipation  -Sorbitol ordered  -12/21 LBM yesterday-improved      LOS: 7 days A FACE TO FACE EVALUATION WAS PERFORMED  1/22 10/16/2022, 12:26 PM

## 2022-10-16 NOTE — Progress Notes (Signed)
Physical Therapy Session Note  Patient Details  Name: Ricardo Yang MRN: [id number] Date of Birth: 03-13-1976  Today's Date: 10/16/2022 PT Individual Time: 7106-2694 and 8546-2703 PT Individual Time Calculation (min): 45 min and 57 min  Short Term Goals: Week 1:  PT Short Term Goal 1 (Week 1): Patient will ambulate >32' with LRAD and CGA PT Short Term Goal 2 (Week 1): Patient will ascend/descend 12 steps with L HR and CGA PT Short Term Goal 3 (Week 1): Patient will performed stand pivot transfer with LRAD and CGA  Skilled Therapeutic Interventions/Progress Updates:     Pt received supine in bed and agrees to therapy. No complaint of pain. Supine to sit with bed features. Pt dons shirt while seated at EOB with minA for R upper extremity. Stand step transfer to [facility] with CGA and cues for initiation and sequencing. WC transport to gym for time management. Stand step to Nustep with cues for positioning. Pt completes Nustep with splint for R hand, performed for endurance training and reciprocal coordination. Pt completes x10:00 at workload of 5 with average steps per minute ~35.   Pt ambulates x175' with CGA/minA due to slight increase in postural sway that appears to increase with distraction.  Pt performs repeated reps of sit to stand with emphasis on loading thorugh R lower extremity. PT cues pt to place R hand on R thigh with L hand over R to provide manual pressure and promote R sided loading for sit to stand. Pt completes x10 reps total with minA and cues for sequencing and body mechanics.  WC transport back to room. Left seated in recliner with all needs within reach.   2nd Session: Pt received seated in recliner and agrees to therapy. No complaint of pain. Stand step transfer to [facility] with CGA and cues for initiation and sequencing. WC transport to gym for time management. Pt performs standing balance activity to challenge dynamic standing balance, single leg stance, lateral weight shifting,  and coordination. Pt initially perform x10 toe taps with L then R lower extremities on 5" step. PT provides CGA and pt does not have and LOBs. Challenge increased by having pt perform same activity while standing on airex mat to provide unreliable somatosensory input and promote ankle strategy. Pt able to complete 2x10 without LOB.  Challenge progressed again but having pt tap toes on orange cone placed between bowling pins to provide increased coordination component. Pt able to perform with CGA at times but has several LOBs when balancing on both L and R lower extremities, requiring minA to modA for balance. Pt ambulates x500' with CGA and cues for increasing gait speed and redirecting pt to task as he is easily distracted. Pt left seated in recliner with all needs within reach.  Therapy Documentation Precautions:  Precautions Precautions: Fall Type of Shoulder Precautions: R hemi UE>LE Restrictions Weight Bearing Restrictions: No RUE Weight Bearing: Non weight bearing   Therapy/Group: Individual Therapy  Beau Fanny, PT, DPT 10/16/2022, 4:02 PM

## 2022-10-16 NOTE — Progress Notes (Signed)
Physical Therapy Session Note  Patient Details  Name: Ricardo Yang MRN: [id number] Date of Birth: 15-Mar-1976  Today's Date: 10/16/2022 PT Individual Time: 1100-1200 PT Individual Time Calculation (min): 60 min   Short Term Goals: Week 1:  PT Short Term Goal 1 (Week 1): Patient will ambulate >87' with LRAD and CGA PT Short Term Goal 2 (Week 1): Patient will ascend/descend 12 steps with L HR and CGA PT Short Term Goal 3 (Week 1): Patient will performed stand pivot transfer with LRAD and CGA  Skilled Therapeutic Interventions/Progress Updates:      Therapy Documentation Precautions:  Precautions Precautions: Fall Type of Shoulder Precautions: R hemi UE>LE Restrictions Weight Bearing Restrictions: No RUE Weight Bearing: Non weight bearing  Pt agreeable to PT session and without verbal complaints of pain in session. Pt SBA with STS and gait to ortho gym. Pt performed right lateral weightbearing into forearm and performed shoulder/elbow extension to return to midline 2 x 10. PT provided extensive stroke and principles of neuro plasticity throughout session. Pt ambulated > 500 ft in session without AD and SBA outside [facility] atrium on unlevel/level terrain (ramps, mulch, and grass) for community integration with 2 losses of balance required min A for recovery. Pt returned to room and left seated in recliner at bedside with all needs in reach and alarm on.   Therapy/Group: Individual Therapy  Truitt Leep Truitt Leep PT, DPT  10/16/2022, 7:37 AM

## 2022-10-17 DIAGNOSIS — G4700 Insomnia, unspecified: Secondary | ICD-10-CM

## 2022-10-17 DIAGNOSIS — K5901 Slow transit constipation: Secondary | ICD-10-CM

## 2022-10-17 DIAGNOSIS — M1711 Unilateral primary osteoarthritis, right knee: Secondary | ICD-10-CM | POA: Insufficient documentation

## 2022-10-17 DIAGNOSIS — M25561 Pain in right knee: Secondary | ICD-10-CM | POA: Insufficient documentation

## 2022-10-17 DIAGNOSIS — M25562 Pain in left knee: Secondary | ICD-10-CM

## 2022-10-17 DIAGNOSIS — G8929 Other chronic pain: Secondary | ICD-10-CM

## 2022-10-17 MED ORDER — DICLOFENAC SODIUM 1 % EX GEL
2.0000 g | Freq: Four times a day (QID) | CUTANEOUS | Status: DC | PRN
  Filled 2022-10-17: qty 100

## 2022-10-17 MED ORDER — TRAZODONE HCL 50 MG PO TABS: 25.0000 mg | ORAL_TABLET | Freq: Every evening | ORAL | Status: DC | PRN

## 2022-10-17 NOTE — Progress Notes (Signed)
Occupational Therapy Weekly Progress Note  Patient Details  Name: Ricardo Yang MRN: [id number] Date of Birth: 08/15/1976  Beginning of progress report period: Oct 10, 2022 End of progress report period: Oct 17, 2022  Today's Date: 10/17/2022 OT Individual Time: 1025-1105 OT Individual Time Calculation (min): 40 min    Patient has met 3 of 3 short term goals.  Ricardo has made great consistent progress in CIR. He has progressed to a CGA level overall for all ADL transfers. His RUE is slowly improving as well with gains in finger/wrist extension and tricep extension. He is also better able to use flexor synergy pattern during functional movement. He  Patient continues to demonstrate the following deficits: muscle weakness, impaired timing and sequencing, abnormal tone, unbalanced muscle activation, and decreased coordination, delayed processing, and decreased sitting balance, decreased standing balance, decreased postural control, hemiplegia, and decreased balance strategies and therefore will continue to benefit from skilled OT intervention to enhance overall performance with BADL and iADL.  Patient progressing toward long term goals..  Continue plan of care.  OT Short Term Goals Week 1:  OT Short Term Goal 1 (Week 1): Pt will don shirt with min A OT Short Term Goal 1 - Progress (Week 1): Met OT Short Term Goal 2 (Week 1): Pt will complete LB dressing with min A OT Short Term Goal 2 - Progress (Week 1): Met OT Short Term Goal 3 (Week 1): Pt will complete toilet transfer with min A with LRAD OT Short Term Goal 3 - Progress (Week 1): Met Week 2:  OT Short Term Goal 1 (Week 2): STG= LTG d/t ELOS  Skilled Therapeutic Interventions/Progress Updates:    Pt received sitting in the dayroom listening to live christmas music. He stood with close (S) and completing rhythmic side stepping along with music to improve dynamic standing balance.   1:1 NMES applied to wrist extensors to promote wrist and  finger extension during functional weightbearing in standing through extended RUE and during grasp and release of Squigz on a table to promote functional RUE use. 15 min on wrist, and then another 10 min on the bicep with guided elbow flexion/extension.   Ratio 1:3 Rate 35 pps Waveform- Asymmetric Ramp 1.0 Pulse 300 Intensity- 27 Duration -   15 min ramp on/off  Report of pain at the beginning of session 0/10 Report of pain at the end of session 0/10  No adverse reactions after treatment and is skin intact.   Pt ended with 150 ft of functional mobility to the other therapy gym with close CGA- (S) to participate in love your [employer] group.    Therapy Documentation Precautions:  Precautions Precautions: Fall Type of Shoulder Precautions: R hemi UE>LE Restrictions Weight Bearing Restrictions: No RUE Weight Bearing: Non weight bearing   Therapy/Group: Individual Therapy  [name] 10/17/2022, 6:47 AM

## 2022-10-17 NOTE — Progress Notes (Signed)
Physical Therapy Session Note  Patient Details  Name: Ricardo Yang MRN: [id number] Date of Birth: Apr 23, 1976  Today's Date: 10/17/2022 PT Individual Time: 1520-1550 PT Individual Time Calculation (min): 30 min    Skilled Therapeutic Interventions/Progress Updates:     Pt received supine in bed and agrees to therapy. No complaint of pain. Pt performs supine to sit with bed features and cues for positioning at EOB. Pt performs sit to stand with cues for initiation. Pt ambulates x300' with close supervision and cues for neutral posture, as well as engaging R scapular retractors and rotator cuff muscles to help maintain integrity of shoulder complex. Pt takes seated rest break. Pt then completes Wii bowling activity to challenge dynamic balance as well as performing large amplitude, dynamic movements combined with fine motor coordination task. Pt able to perform entirety of activity in standing without rest break. Pt then ambulates back to room with same cues, in addition to cues to focus on task, as patient safety awareness and balance suffer when he is distracted. Pt left seated in recliner with alarm intact and all needs within reach.  Therapy Documentation Precautions:  Precautions Precautions: Fall Type of Shoulder Precautions: R hemi UE>LE Restrictions Weight Bearing Restrictions: No RUE Weight Bearing: Non weight bearing    Therapy/Group: Individual Therapy  Beau Fanny, PT, DPT 10/17/2022, 4:28 PM

## 2022-10-17 NOTE — Progress Notes (Signed)
PROGRESS NOTE   Subjective/Complaints: Feeling pretty good, sorbitol yesterday helped with BM, last yesterday and not hard.  No increased edema, but states he's having some mild L>R knee pain which is chronic but since he's doing more therapies, it's hurting more. Usually would take BC powders at home. No acute complaints/concern, just chronic pain. Some difficulty staying asleep, has some daytime sleepiness because of therapies but then waking up around 3am with trouble falling back to sleep.  Otherwise, no new complaints today.    Review of Systems  Constitutional:  Negative for fever.  HENT:  Negative for congestion.   Respiratory:  Negative for shortness of breath.   Cardiovascular:  Negative for chest pain and leg swelling.  Gastrointestinal:  Positive for constipation (improved). Negative for abdominal pain, nausea and vomiting.  Genitourinary: Negative.  Negative for dysuria.  Musculoskeletal:  Positive for joint pain (knees b/l).  Skin:  Negative for rash.  Neurological:  Positive for sensory change and focal weakness (R arm). Negative for weakness.  Psychiatric/Behavioral:  Negative for depression. The patient has insomnia.     Objective:   No results found. No results for input(s): "WBC", "HGB", "HCT", "PLT" in the last 72 hours.  No results for input(s): "NA", "K", "CL", "CO2", "GLUCOSE", "BUN", "CREATININE", "CALCIUM" in the last 72 hours.   Intake/Output Summary (Last 24 hours) at 10/17/2022 1016 Last data filed at 10/17/2022 0829 Gross per 24 hour  Intake 880 ml  Output 1325 ml  Net -445 ml         Physical Exam: Vital Signs Blood pressure 107/72, pulse 68, temperature 98.1 F (36.7 C), temperature source Oral, resp. rate 16, height 6\' 3"  (1.905 m), weight 107.3 kg, SpO2 100 %.   General: NAD, sitting in bedside chair HEENT: Head is normocephalic, atraumatic, conjugate gaze, oral mucosa pink and  moist,  Neck: Supple without JVD or lymphadenopathy Heart: Reg rate and rhythm. No murmurs rubs or gallops, holter monitor in place Chest: CTA bilaterally without wheezes, rales, or rhonchi; good air movement Abdomen: Soft, non-tender, non-distended, bowel sounds positive. Extremities: No clubbing, cyanosis, or edema. Pulses are 2+ Psych: Pt's affect is appropriate. Pt is cooperative. pleasant Skin: warm and dry Neuro:  alert and oriented x4, follows commands, CN 2-12 grossly intact, Mild aphasia. Fair judgement and insight.  Musculoskeletal: NO joint swelling or tenderness noted but reports discomfort of b/l knees, decreased tone RUE  Prior exam  Strength 5/5 in all LUE and LLE Strength 2-3/5 in RUE shoulder abduction, elbow extension and flexion, finger flexion Strength 4/5 proximal  5/5 distal RLE Sensation intact in all 4 extremities but decreased in RUE  Assessment/Plan: 1. Functional deficits which require 3+ hours per day of interdisciplinary therapy in a comprehensive inpatient rehab setting. Physiatrist is providing close team supervision and 24 hour management of active medical problems listed below. Physiatrist and rehab team continue to assess barriers to discharge/monitor patient progress toward functional and medical goals  Care Tool:  Bathing    Body parts bathed by patient: Right arm, Chest, Abdomen, Front perineal area, Buttocks, Right upper leg, Left upper leg, Right lower leg, Left lower leg, Face, Left arm   Body  parts bathed by helper: Left arm     Bathing assist Assist Level: Minimal Assistance - Patient > 75%     Upper Body Dressing/Undressing Upper body dressing   What is the patient wearing?: Pull over shirt    Upper body assist Assist Level: Minimal Assistance - Patient > 75%    Lower Body Dressing/Undressing Lower body dressing      What is the patient wearing?: Pants     Lower body assist Assist for lower body dressing: Minimal Assistance -  Patient > 75%     Toileting Toileting    Toileting assist Assist for toileting: Minimal Assistance - Patient > 75%     Transfers Chair/bed transfer  Transfers assist     Chair/bed transfer assist level: Contact Guard/Touching assist     Locomotion Ambulation   Ambulation assist      Assist level: Contact Guard/Touching assist Assistive device: No Device Max distance: 300+   Walk 10 feet activity   Assist     Assist level: Contact Guard/Touching assist Assistive device: No Device   Walk 50 feet activity   Assist    Assist level: Contact Guard/Touching assist Assistive device: No Device    Walk 150 feet activity   Assist    Assist level: Contact Guard/Touching assist Assistive device: No Device    Walk 10 feet on uneven surface  activity   Assist     Assist level: Contact Guard/Touching assist Assistive device: Other (comment) (no device)   Wheelchair     Assist Is the patient using a wheelchair?: Yes Type of Wheelchair: Manual    Wheelchair assist level: Supervision/Verbal cueing Max wheelchair distance: >200'    Wheelchair 50 feet with 2 turns activity    Assist        Assist Level: Supervision/Verbal cueing   Wheelchair 150 feet activity     Assist      Assist Level: Supervision/Verbal cueing   Blood pressure 107/72, pulse 68, temperature 98.1 F (36.7 C), temperature source Oral, resp. rate 16, height 6\' 3"  (1.905 m), weight 107.3 kg, SpO2 100 %.  Medical Problem List and Plan: 1. 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  Antithrombotics: -DVT/anticoagulation:  Mechanical: Antiembolism stockings, thigh (TED hose) Bilateral lower extremities             -antiplatelet  therapy: Aspirin 81 mg daily and Plavix 75 mg daily 3. Pain Management: Tylenol as needed 4. Mood/Behavior/Sleep: Provide emotional support             -antipsychotic agents: N/A 5. Neuropsych/cognition: This patient is not capable of making decisions on his own behalf. 6. Skin/Wound Care: Routine skin checks 7. Fluids/Electrolytes/Nutrition: Routine in and outs with follow-up chemistries 8.  CAD/history of PCI/chronic systolic congestive heart failure with ejection fraction 20 to 25%.   -Daily weights.  -TEE without thrombus.  Monitor for any signs of fluid overload.   -Continue Farxiga 10 mg daily.  Continue asa and plavix.    -Follow-up outpatient cardiology services -continue Holter monitor, called company as holter monitor blinking red, called company- noted to be bad  contact with skin, contact improved and it turned green, continue to monitor  -Wt stable today 12/22, continue to monitor, denies complaints Filed Weights   10/10/22 0344 10/13/22 0500 10/17/22 0403  Weight: 105.9 kg 107.5 kg 107.3 kg    9.  Hypertension.  Lisinopril 2.5 mg daily, Toprol-XL 12.5 mg daily.   Monitor with increased mobility  -BP well controlled, continue to monitor    10/17/2022    4:03 AM 10/16/2022    7:26 PM 10/16/2022    1:40 PM  Vitals with BMI  Weight 236 lbs 9 oz    BMI [id number]    Systolic [id number]  Diastolic 72 69 78  Pulse 68 71 73   10.  Hyperlipidemia.  Lipitor 80mg  daily. Heart healthy diet 11.  History of tobacco as well as polysubstance use.  Urine drug screen positive marijuana as well as cocaine.  Provide counseling 12.  Obesity.  BMI 30.70.  Dietary follow-up 13. Leukocytosis: monitor for s/sx of infection  -WNL 12/18 14. Constipation: Miralax QD ordered  -Sorbitol ordered and helped significantly  -12/22 LBM yesterday-improved, continue current regimen  15. Chronic b/l knee pain: reports some increased pain in popliteal fossas with increased mobility, no acute  complaints/concerns, usually takes BC powder at home  -Tylenol PRN ordered  -Ordered Voltaren 1% gel 2g QID PRN 16. Insomnia: difficulty staying asleep  -Melatonin 3mg  QHS ordered  -Ordered Trazodone 25mg  QHS PRN, monitor for response/use      LOS: 8 days A FACE TO [place] 10/17/2022, 10:16 AM

## 2022-10-17 NOTE — Progress Notes (Signed)
Occupational Therapy Session Note  Patient Details  Name: Ricardo Yang MRN: [id number] Date of Birth: August 20, 1976  Today's Date: 10/17/2022 OT Individual Time: 1105-1200 OT Individual Time Calculation (min): 55 min    Short Term Goals: Week 2:  OT Short Term Goal 1 (Week 2): STG= LTG d/t ELOS  Skilled Therapeutic Interventions/Progress Updates:  Pain reported during session as 0/10.  Patient actively participated in the [employer] program in a group setting for social participation. Session focused on education and training in breathing techniques to regulate the nervous system, gentle yoga poses with a focus on dynamic standing balance, RUE NMR, RUE coordination/motor planning, guided meditation to regulate attention and guided discussion on the topic of "finding Peace." . Patient required MIN A- CGA for dynamic standing balance with both no UE support and unilateral support. Modifications provided for impaired RUE AROM with pt using LUE as active assist for functional reaching/stretching. Provided NMR to RUE with pt completing lateral leans to RUE for NMR from EOM with MIN cues for positioning.  Exited session with pt seated in recliner, and all needs within reach.   Therapy Documentation Precautions:  Precautions Precautions: Fall Type of Shoulder Precautions: R hemi UE>LE Restrictions Weight Bearing Restrictions: No RUE Weight Bearing: Non weight bearing  Therapy/Group: Individual Therapy  Barron Schmid 10/17/2022, 12:11 PM

## 2022-10-17 NOTE — Progress Notes (Signed)
Physical Therapy Weekly Progress Note  Patient Details  Name: Ricardo Yang MRN: [id number] Date of Birth: 1976-04-18  Beginning of progress report period: October 10, 2022 End of progress report period: October 17, 2022  Today's Date: 10/17/2022 PT Individual Time: 0802-0915 PT Individual Time Calculation (min): 73 min   Patient has met 3 of 3 short term goals.  Pt has made great progress, meeting 3 of 3 STGs.  Pt now amb w/o AD and close supervision for majority although occasional CGA for LOB.  Pt somewhat impulsive and requires verbal cues for safety.  Pt requires cueing for increased speed during gait for improved BOS and RLE placement to avoid scissoring.  Pt to continue POC to meet LTGs.  Patient continues to demonstrate the following deficits decreased coordination and therefore will continue to benefit from skilled PT intervention to increase functional independence with mobility.  Patient progressing toward long term goals..  Continue plan of care.  PT Short Term Goals Week 2:     Skilled Therapeutic Interventions/Progress Updates: Pt presents supine in bed and agreeable to therapy.  Pt transfers sidelying to sit using siderails and supervision to mod I.  Pt dons pullover shirt w/ set-up.  Cues for sequencing but pt states better when dons L arm first.  Discussed comfort of R shoulder over time.  Pt transfers throughout session w/ supervision.  Pt amb 300+ w/o AD w/ supervision, multiple trials but noted trunk tilt to right.  Pt amb over ramp and onto mulch w/ close sup/CGA. Pt performed step-ups to 7" step w/ R LE only.  Pt performed negotiation of floor ladder w/ reciprocal gait and then increased to tap to small cone laterally and then stepping reciprocally to next step.  Pt then performed double taps laterally and then forward laterally.  Pt returned to room and remained sitting in recliner w/ chair alarm on and all needs in reach.      Therapy Documentation Precautions:   Precautions Precautions: Fall Type of Shoulder Precautions: R hemi UE>LE Restrictions Weight Bearing Restrictions: No RUE Weight Bearing: Non weight bearing General:   Vital Signs:   Pain:0/10      Therapy/Group: Individual Therapy  Ladoris Gene 10/17/2022, 9:19 AM

## 2022-10-18 NOTE — Progress Notes (Signed)
PROGRESS NOTE   Subjective/Complaints: No new complaints this mornign   Review of Systems  Constitutional:  Negative for chills and fever.  HENT:  Negative for congestion.   Respiratory:  Negative for shortness of breath.   Cardiovascular:  Negative for chest pain.  Gastrointestinal:  Negative for abdominal pain, constipation, nausea and vomiting.  Genitourinary: Negative.   Skin:  Negative for rash.  Neurological:  Positive for sensory change and focal weakness. Negative for weakness.  Psychiatric/Behavioral:  Negative for depression. The patient has insomnia.     Objective:   No results found. No results for input(s): "WBC", "HGB", "HCT", "PLT" in the last 72 hours.  No results for input(s): "NA", "K", "CL", "CO2", "GLUCOSE", "BUN", "CREATININE", "CALCIUM" in the last 72 hours.   Intake/Output Summary (Last 24 hours) at 10/18/2022 1449 Last data filed at 10/17/2022 1821 Gross per 24 hour  Intake 480 ml  Output --  Net 480 ml         Physical Exam: Vital Signs Blood pressure 106/71, pulse 69, temperature 98.6 F (37 C), resp. rate 15, height 6\' 3"  (1.905 m), weight 107.4 kg, SpO2 98 %.   Gen: no distress, normal appearing HEENT: oral mucosa pink and moist, NCAT Cardio: Reg rate Chest: normal effort, normal rate of breathing Abd: soft, non-distended Ext: no edema Psych: Pt's affect is appropriate. Pt is cooperative. pleasant Skin: warm and dry Neuro:  alert and oriented x4, follows commands, CN 2-12 grossly intact, Mild aphasia. Fair judgement and insight.  Moving all 4 extremities Musculoskeletal: NO joint swelling or tenderness noted, decreased tone RUE  Prior exam  Strength 5/5 in all LUE and LLE Strength 2-3/5 in RUE shoulder abduction, elbow extension and flexion, finger flexion Strength 4/5 proximal  5/5 distal RLE Sensation intact in all 4 extremities but decreased in  RUE  Assessment/Plan: 1. Functional deficits which require 3+ hours per day of interdisciplinary therapy in a comprehensive inpatient rehab setting. Physiatrist is providing close team supervision and 24 hour management of active medical problems listed below. Physiatrist and rehab team continue to assess barriers to discharge/monitor patient progress toward functional and medical goals  Care Tool:  Bathing    Body parts bathed by patient: Right arm, Chest, Abdomen, Front perineal area, Buttocks, Right upper leg, Left upper leg, Right lower leg, Left lower leg, Face, Left arm   Body parts bathed by helper: Left arm     Bathing assist Assist Level: Minimal Assistance - Patient > 75%     Upper Body Dressing/Undressing Upper body dressing   What is the patient wearing?: Pull over shirt    Upper body assist Assist Level: Minimal Assistance - Patient > 75%    Lower Body Dressing/Undressing Lower body dressing      What is the patient wearing?: Pants     Lower body assist Assist for lower body dressing: Minimal Assistance - Patient > 75%     Toileting Toileting    Toileting assist Assist for toileting: Minimal Assistance - Patient > 75%     Transfers Chair/bed transfer  Transfers assist     Chair/bed transfer assist level: Contact Guard/Touching assist     Locomotion  Ambulation   Ambulation assist      Assist level: Contact Guard/Touching assist Assistive device: No Device Max distance: 300+   Walk 10 feet activity   Assist     Assist level: Contact Guard/Touching assist Assistive device: No Device   Walk 50 feet activity   Assist    Assist level: Contact Guard/Touching assist Assistive device: No Device    Walk 150 feet activity   Assist    Assist level: Contact Guard/Touching assist Assistive device: No Device    Walk 10 feet on uneven surface  activity   Assist     Assist level: Contact Guard/Touching assist Assistive  device: Other (comment) (no device)   Wheelchair     Assist Is the patient using a wheelchair?: Yes Type of Wheelchair: Manual    Wheelchair assist level: Supervision/Verbal cueing Max wheelchair distance: >200'    Wheelchair 50 feet with 2 turns activity    Assist        Assist Level: Supervision/Verbal cueing   Wheelchair 150 feet activity     Assist      Assist Level: Supervision/Verbal cueing   Blood pressure 106/71, pulse 69, temperature 98.6 F (37 C), resp. rate 15, height 6\' 3"  (1.905 m), weight 107.4 kg, SpO2 98 %.  Medical Problem List and Plan: 1. Functional deficits secondary to left MCA territory infarction             -patient may  shower             -ELOS/Goals: 10-12 days, mod I PT/OT, ind SLP             -Continue CIR with PT/OT/SLP   -Ambulating 400 feet  -Seen by neuropsych working on coping, appreciate assistance  2.  Antithrombotics: -DVT/anticoagulation:  Mechanical: Antiembolism stockings, thigh (TED hose) Bilateral lower extremities             -antiplatelet therapy: Aspirin 81 mg daily and Plavix 75 mg daily 3. Pain Management: Tylenol as needed 4. Mood/Behavior/Sleep: Provide emotional support             -antipsychotic agents: N/A 5. Neuropsych/cognition: This patient is not capable of making decisions on his own behalf. 6. Skin/Wound Care: Routine skin checks 7. Fluids/Electrolytes/Nutrition: Routine in and outs with follow-up chemistries 8.  CAD/history of PCI/chronic systolic congestive heart failure with ejection fraction 20 to 25%.  Daily weights. TEE without thrombus.  Monitor for any signs of fluid overload.  Continue Farxiga 10 mg daily.  Continue asa and plavix.   Follow-up outpatient cardiology services -continue Holter monitor, called company as holter monitor blinking red, called company- noted to be bad contact with skin, contact improved and it turned green, continue to monitor  Filed Weights   10/13/22 0500  10/17/22 0403 10/18/22 0435  Weight: 107.5 kg 107.3 kg 107.4 kg  Weight continues to be stable today Denies chest pain  9.  Hypertension.  Continue Lisinopril 2.5 mg daily, Toprol-XL 12.5 mg daily.   Monitor with increased mobility      10/18/2022    2:17 PM 10/18/2022    9:13 AM 10/18/2022    9:12 AM  Vitals with BMI  Systolic 106  106  Diastolic 71  67  Pulse 69 70   BP well controlled, continue to monitor  10.  Hyperlipidemia.  Continue Lipitor 80mg  daily. Heart healthy diet 11.  History of tobacco as well as polysubstance use.  Urine drug screen positive marijuana as well as cocaine.  Provide  counseling 12.  Obesity.  BMI 30.70.  Dietary follow-up 13. Leukocytosis  -WNL 12/18 14. Constipation  -Sorbitol ordered  -12/21 LBM yesterday-improved      LOS: 9 days A FACE TO FACE EVALUATION WAS PERFORMED  Drema Pry Othar Curto 10/18/2022, 2:49 PM

## 2022-10-18 NOTE — Progress Notes (Signed)
Occupational Therapy Session Note  Patient Details  Name: Ricardo Yang MRN: [id number] Date of Birth: [birth year]  Today's Date: 10/18/2022 OT Individual Time: 0940-1005 OT Individual Time Calculation (min): 25 min    Short Term Goals: Week 2:  OT Short Term Goal 1 (Week 2): STG= LTG d/t ELOS  Skilled Therapeutic Interventions/Progress Updates:    Pt received supine with no c/o pain, agreeable to OT session. Pt donned shirt, shoes, and socks with (S) EOB. He completed 100 ft of functional mobility to the therapy gym with CGA. Session focused on RUE NMR- gravity eliminated R shoulder flexion onto an elevated rail with min facilitation at the hand and wrist. Pt completed high repetition in standing. Pt held onto the rail and with OT facilitation at his hand while he stepped on/off a 8 in step with min A. He returned to his room. Pt was left sitting up in the recliner with all needs met and call bell within reach.   Therapy Documentation Precautions:  Precautions Precautions: Fall Type of Shoulder Precautions: R hemi UE>LE Restrictions Weight Bearing Restrictions: No RUE Weight Bearing: Non weight bearing   Therapy/Group: Individual Therapy  [name] 10/18/2022, 10:00 AM

## 2022-10-18 NOTE — Progress Notes (Signed)
PROGRESS NOTE   Subjective/Complaints: No new complaints today, doing well. Didn't have a BM yesterday but states he's not uncomfortable and feels ok. No issues with his knees today, slept ok but doesn't feel he needs anything additional today.    Review of Systems  Constitutional:  Negative for fever.  HENT:  Negative for congestion.   Respiratory:  Negative for shortness of breath.   Cardiovascular:  Negative for chest pain and leg swelling.  Gastrointestinal:  Positive for constipation (improved). Negative for abdominal pain, nausea and vomiting.  Genitourinary: Negative.  Negative for dysuria.  Musculoskeletal:  Positive for joint pain (b/l knees, chronic, improving).  Skin:  Negative for rash.  Neurological:  Positive for sensory change and focal weakness (R arm). Negative for weakness.  Psychiatric/Behavioral:  Negative for depression. The patient has insomnia.     Objective:   No results found. No results for input(s): "WBC", "HGB", "HCT", "PLT" in the last 72 hours.  No results for input(s): "NA", "K", "CL", "CO2", "GLUCOSE", "BUN", "CREATININE", "CALCIUM" in the last 72 hours.   Intake/Output Summary (Last 24 hours) at 10/18/2022 0835 Last data filed at 10/17/2022 1821 Gross per 24 hour  Intake 480 ml  Output --  Net 480 ml         Physical Exam: Vital Signs Blood pressure 108/70, pulse 65, temperature 98.2 F (36.8 C), temperature source Oral, resp. rate 16, height 6\' 3"  (1.905 m), weight 107.4 kg, SpO2 100 %.   General: NAD, laying in bed, comfortable appearing HEENT: Head is normocephalic, atraumatic, conjugate gaze, oral mucosa pink and moist,  Neck: Supple without JVD or lymphadenopathy Heart: Reg rate and rhythm. No murmurs rubs or gallops, holter monitor in place Chest: CTA bilaterally without wheezes, rales, or rhonchi; good air movement Abdomen: Soft, non-tender, non-distended, bowel sounds  positive. Extremities: No clubbing, cyanosis, or edema. Pulses are 2+ Psych: Pt's affect is appropriate. Pt is cooperative. pleasant Skin: warm and dry Neuro:  alert and oriented x4, follows commands, CN 2-12 grossly intact, Mild aphasia. Fair judgement and insight.  Musculoskeletal: NO joint swelling or tenderness noted, decreased tone RUE  Prior exam  Strength 5/5 in all LUE and LLE Strength 2-3/5 in RUE shoulder abduction, elbow extension and flexion, finger flexion Strength 4/5 proximal  5/5 distal RLE Sensation intact in all 4 extremities but decreased in RUE  Assessment/Plan: 1. Functional deficits which require 3+ hours per day of interdisciplinary therapy in a comprehensive inpatient rehab setting. Physiatrist is providing close team supervision and 24 hour management of active medical problems listed below. Physiatrist and rehab team continue to assess barriers to discharge/monitor patient progress toward functional and medical goals  Care Tool:  Bathing    Body parts bathed by patient: Right arm, Chest, Abdomen, Front perineal area, Buttocks, Right upper leg, Left upper leg, Right lower leg, Left lower leg, Face, Left arm   Body parts bathed by helper: Left arm     Bathing assist Assist Level: Minimal Assistance - Patient > 75%     Upper Body Dressing/Undressing Upper body dressing   What is the patient wearing?: Pull over shirt    Upper body assist Assist Level: Minimal  Assistance - Patient > 75%    Lower Body Dressing/Undressing Lower body dressing      What is the patient wearing?: Pants     Lower body assist Assist for lower body dressing: Minimal Assistance - Patient > 75%     Toileting Toileting    Toileting assist Assist for toileting: Minimal Assistance - Patient > 75%     Transfers Chair/bed transfer  Transfers assist     Chair/bed transfer assist level: Contact Guard/Touching assist     Locomotion Ambulation   Ambulation assist       Assist level: Contact Guard/Touching assist Assistive device: No Device Max distance: 300+   Walk 10 feet activity   Assist     Assist level: Contact Guard/Touching assist Assistive device: No Device   Walk 50 feet activity   Assist    Assist level: Contact Guard/Touching assist Assistive device: No Device    Walk 150 feet activity   Assist    Assist level: Contact Guard/Touching assist Assistive device: No Device    Walk 10 feet on uneven surface  activity   Assist     Assist level: Contact Guard/Touching assist Assistive device: Other (comment) (no device)   Wheelchair     Assist Is the patient using a wheelchair?: Yes Type of Wheelchair: Manual    Wheelchair assist level: Supervision/Verbal cueing Max wheelchair distance: >200'    Wheelchair 50 feet with 2 turns activity    Assist        Assist Level: Supervision/Verbal cueing   Wheelchair 150 feet activity     Assist      Assist Level: Supervision/Verbal cueing   Blood pressure 108/70, pulse 65, temperature 98.2 F (36.8 C), temperature source Oral, resp. rate 16, height 6\' 3"  (1.905 m), weight 107.4 kg, SpO2 100 %.  Medical Problem List and Plan: 1. 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  Antithrombotics: -DVT/anticoagulation:  Mechanical: Antiembolism stockings, thigh (TED hose) Bilateral lower extremities             -antiplatelet therapy: Aspirin 81 mg daily and Plavix 75 mg daily 3. Pain Management: Tylenol as needed 4. Mood/Behavior/Sleep: Provide emotional support             -antipsychotic agents: N/A  -see #16 5. Neuropsych/cognition: This patient is not capable of making decisions on his own behalf. 6. Skin/Wound Care:  Routine skin checks 7. Fluids/Electrolytes/Nutrition: Routine in and outs with follow-up chemistries 8.  CAD/history of PCI/chronic systolic congestive heart failure with ejection fraction 20 to 25%.   -Daily weights.  -TEE without thrombus.  Monitor for any signs of fluid overload.   -Continue Farxiga 10 mg daily.  Continue asa and plavix.    -Follow-up outpatient cardiology services -continue Holter monitor, called company as holter monitor blinking red, called company- noted to be bad contact with skin, contact improved and it turned green, continue to monitor  -Wt stable today 12/23, continue to monitor, denies complaints Filed Weights   10/13/22 0500 10/17/22 0403 10/18/22 0435  Weight: 107.5 kg 107.3 kg 107.4 kg    9.  Hypertension.  Lisinopril 2.5 mg daily, Toprol-XL 12.5 mg daily.   Monitor with increased mobility  -BP well controlled and stable 12/23, continue to monitor    10/18/2022    4:35 AM 10/17/2022    7:31 PM 10/17/2022    1:47 PM  Vitals with BMI  Weight 236 lbs 12 oz    BMI 29.59    Systolic 108 116 885  Diastolic 70 74 71  Pulse 65 75 77   10.  Hyperlipidemia.  Lipitor 80mg  daily. Heart healthy diet 11.  History of tobacco as well as polysubstance use.  Urine drug screen positive marijuana as well as cocaine.  Provide counseling 12.  Obesity.  BMI 30.70.  Dietary follow-up 13. Leukocytosis: monitor for s/sx of infection  -WNL 12/18  -No planned labs as of 12/23, order PRN if indicated 14. Constipation: Miralax QD ordered  -Sorbitol 70% 71mL QD PRN ordered and helped significantly  -12/22 LBM yesterday-improved, continue current regimen -12/23 no BM since 12/21 but denies complaints, knows he has meds ordered if needed, continue current regimen 15. Chronic b/l knee pain: reports some increased pain in popliteal fossas with increased mobility, no acute complaints/concerns, usually takes BC powder at home  -Tylenol PRN ordered  -Ordered Voltaren 1% gel 2g QID  PRN-- not used, knows to ask if needed 16. Insomnia: difficulty staying asleep  -Melatonin 3mg  QHS ordered  -12/22 Ordered Trazodone 25mg  QHS PRN-- not used, knows to ask      LOS: 9 days A FACE TO FACE EVALUATION WAS PERFORMED  [address] 10/18/2022, 8:35 AM

## 2022-10-19 NOTE — Progress Notes (Signed)
PROGRESS NOTE   Subjective/Complaints: No new complaints today, doing well. No BM today but took sorbitol, feels ok. Poor sleep still but thinks it's from sleeping earlier than usual. No other complaints or concerns today.    Review of Systems  Constitutional:  Negative for fever.  HENT:  Negative for congestion.   Respiratory:  Negative for shortness of breath.   Cardiovascular:  Negative for chest pain and leg swelling.  Gastrointestinal:  Positive for constipation. Negative for abdominal pain, nausea and vomiting.  Genitourinary: Negative.  Negative for dysuria.  Musculoskeletal:  Positive for joint pain (b/l knees, chronic, improving).  Skin:  Negative for rash.  Neurological:  Positive for sensory change and focal weakness (R arm). Negative for weakness.  Psychiatric/Behavioral:  Negative for depression. The patient has insomnia.     Objective:   No results found. No results for input(s): "WBC", "HGB", "HCT", "PLT" in the last 72 hours.  No results for input(s): "NA", "K", "CL", "CO2", "GLUCOSE", "BUN", "CREATININE", "CALCIUM" in the last 72 hours.   Intake/Output Summary (Last 24 hours) at 10/19/2022 0825 Last data filed at 10/19/2022 0210 Gross per 24 hour  Intake --  Output 1900 ml  Net -1900 ml         Physical Exam: Vital Signs Blood pressure 116/85, pulse 79, temperature 98 F (36.7 C), temperature source Oral, resp. rate 20, height 6\' 3"  (1.905 m), weight 107.4 kg, SpO2 99 %.   General: NAD, laying in bed, comfortable appearing HEENT: Head is normocephalic, atraumatic, conjugate gaze, oral mucosa pink and moist,  Neck: Supple without JVD or lymphadenopathy Heart: Reg rate and rhythm. No murmurs rubs or gallops, holter monitor in place Chest: CTA bilaterally without wheezes, rales, or rhonchi; good air movement Abdomen: Soft, non-tender, non-distended, bowel sounds positive. Extremities: No  clubbing, cyanosis, or edema. Pulses are 2+ Psych: Pt's affect is appropriate. Pt is cooperative. pleasant Skin: warm and dry Neuro:  alert and oriented x4, follows commands, CN 2-12 grossly intact, Mild aphasia. Fair judgement and insight.  Musculoskeletal: NO joint swelling or tenderness noted, decreased tone RUE, cooperating in PT well, walking up and down steps smoothly  Prior exam  Strength 5/5 in all LUE and LLE Strength 2-3/5 in RUE shoulder abduction, elbow extension and flexion, finger flexion Strength 4/5 proximal  5/5 distal RLE Sensation intact in all 4 extremities but decreased in RUE  Assessment/Plan: 1. Functional deficits which require 3+ hours per day of interdisciplinary therapy in a comprehensive inpatient rehab setting. Physiatrist is providing close team supervision and 24 hour management of active medical problems listed below. Physiatrist and rehab team continue to assess barriers to discharge/monitor patient progress toward functional and medical goals  Care Tool:  Bathing    Body parts bathed by patient: Right arm, Chest, Abdomen, Front perineal area, Buttocks, Right upper leg, Left upper leg, Right lower leg, Left lower leg, Face, Left arm   Body parts bathed by helper: Left arm     Bathing assist Assist Level: Minimal Assistance - Patient > 75%     Upper Body Dressing/Undressing Upper body dressing   What is the patient wearing?: Pull over shirt    Upper  body assist Assist Level: Minimal Assistance - Patient > 75%    Lower Body Dressing/Undressing Lower body dressing      What is the patient wearing?: Pants     Lower body assist Assist for lower body dressing: Minimal Assistance - Patient > 75%     Toileting Toileting    Toileting assist Assist for toileting: Minimal Assistance - Patient > 75%     Transfers Chair/bed transfer  Transfers assist     Chair/bed transfer assist level: Contact Guard/Touching assist      Locomotion Ambulation   Ambulation assist      Assist level: Contact Guard/Touching assist Assistive device: No Device Max distance: 300+   Walk 10 feet activity   Assist     Assist level: Contact Guard/Touching assist Assistive device: No Device   Walk 50 feet activity   Assist    Assist level: Contact Guard/Touching assist Assistive device: No Device    Walk 150 feet activity   Assist    Assist level: Contact Guard/Touching assist Assistive device: No Device    Walk 10 feet on uneven surface  activity   Assist     Assist level: Contact Guard/Touching assist Assistive device: Other (comment) (no device)   Wheelchair     Assist Is the patient using a wheelchair?: Yes Type of Wheelchair: Manual    Wheelchair assist level: Supervision/Verbal cueing Max wheelchair distance: >200'    Wheelchair 50 feet with 2 turns activity    Assist        Assist Level: Supervision/Verbal cueing   Wheelchair 150 feet activity     Assist      Assist Level: Supervision/Verbal cueing   Blood pressure 116/85, pulse 79, temperature 98 F (36.7 C), temperature source Oral, resp. rate 20, height 6\' 3"  (1.905 m), weight 107.4 kg, SpO2 99 %.  Medical Problem List and Plan: 1. Functional deficits secondary to left MCA territory infarction             -patient may  shower             -ELOS/Goals: 10-12 days, mod I PT/OT, ind SLP             -CIR with PT/OT/SLP   -Ambulating 300+ feet  -Seen by neuropsych working on coping, appreciate assistance  2.  Antithrombotics: -DVT/anticoagulation:  Mechanical: Antiembolism stockings, thigh (TED hose) Bilateral lower extremities             -antiplatelet therapy: Aspirin 81 mg daily and Plavix 75 mg daily 3. Pain Management: Tylenol as needed 4. Mood/Behavior/Sleep: Provide emotional support             -antipsychotic agents: N/A  -see #16 5. Neuropsych/cognition: This patient is not capable of making  decisions on his own behalf. 6. Skin/Wound Care: Routine skin checks 7. Fluids/Electrolytes/Nutrition: Routine in and outs with follow-up chemistries 8.  CAD/history of PCI/chronic systolic congestive heart failure with ejection fraction 20 to 25%.   -Daily weights.  -TEE without thrombus.  Monitor for any signs of fluid overload.   -Continue Farxiga 10 mg daily.  Continue asa and plavix.    -Follow-up outpatient cardiology services -continue Holter monitor, called company as holter monitor blinking red, called company- noted to be bad contact with skin, contact improved and it turned green, continue to monitor -Wt stable today 10/19/22, continue to monitor, denies complaints, no s/sx of fluid overload Filed Weights   10/13/22 0500 10/17/22 0403 10/18/22 0435  Weight: 107.5 kg 107.3  kg 107.4 kg    9.  Hypertension.  Lisinopril 2.5 mg daily, Toprol-XL 12.5 mg daily.   Monitor with increased mobility  -BP well controlled and stable 10/19/22, continue to monitor    10/19/2022    8:00 AM 10/19/2022    3:31 AM 10/18/2022    7:42 PM  Vitals with BMI  Systolic 116 114 364  Diastolic 85 76 72  Pulse 79 68 68   10.  Hyperlipidemia.  Lipitor 80mg  daily. Heart healthy diet 11.  History of tobacco as well as polysubstance use.  Urine drug screen positive marijuana as well as cocaine.  Provide counseling 12.  Obesity.  BMI 30.70.  Dietary follow-up 13. Leukocytosis: monitor for s/sx of infection  -WNL 12/18  -Monitor on weekly labs 10/20/22 14. Constipation: Miralax QD ordered  -Sorbitol 70% 66mL QD PRN ordered and helped significantly  -12/22 LBM yesterday-improved, continue current regimen -12/23 no BM since 12/21 but denies complaints, knows he has meds ordered if needed, continue current regimen -10/19/22 no BM today so took sorbitol, awaiting response; continue to monitor, encourage PO hydration 15. Chronic b/l knee pain: reports some increased pain in popliteal fossas with increased  mobility, no acute complaints/concerns, usually takes BC powder at home  -Tylenol PRN ordered  -Ordered Voltaren 1% gel 2g QID PRN-- not used, knows to ask if needed  -10/19/22 Denies changes, continue monitoring 16. Insomnia: difficulty staying asleep  -Melatonin 3mg  QHS ordered  -12/22 Ordered Trazodone 25mg  QHS PRN-- not used, knows to ask  -10/19/22 Still having difficulty but managing without meds      LOS: 10 days A FACE TO FACE EVALUATION WAS PERFORMED  [address] 10/19/2022, 8:25 AM

## 2022-10-19 NOTE — Progress Notes (Signed)
Physical Therapy Session Note  Patient Details  Name: Ricardo Yang MRN: [id number] Date of Birth: [birth year]  Today's Date: 10/19/2022 PT Individual Time: 0903-1000 PT Individual Time Calculation (min): 57 min   Short Term Goals: Week 2:  PT Short Term Goal 1 (Week 2): STGs = LTGs  Skilled Therapeutic Interventions/Progress Updates:    Chart reviewed and pt agreeable to therapy. Pt received semi-reclined in bed with no c/o pain. Session focused on review of independence with functional mobility to prepare for home access and upcoming d/c. Pt initiated session with sit to stand and SST to recliner with supervision + no AD. Pt then ambulated >145ft to therapy gym and traversed 12 steps with supervision + single rail per home set up. Pt then amb >1029ft around hospital grounds including uneven surfaces, stairs, and elevators all with supervision + no AD. In room, pt practiced sit to stand with no hand support. Pt also completed 2x10 heel raises. At end of session, pt was left seated in recliner with alarm engaged, nurse call bell and all needs in reach.     Therapy Documentation Precautions:  Precautions Precautions: Fall Type of Shoulder Precautions: R hemi UE>LE Restrictions Weight Bearing Restrictions: No RUE Weight Bearing: Non weight bearing    Therapy/Group: Individual Therapy  Dionne Milo, PT, DPT 10/19/2022, 10:01 AM

## 2022-10-19 NOTE — Progress Notes (Signed)
Occupational Therapy Session Note  Patient Details  Name: [place] L Ishee MRN: [id number] Date of Birth: 20-Nov-1975  Today's Date: 10/19/2022 OT Individual Time: 1255-1335 OT Individual Time Calculation (min): 40 min    Short Term Goals: Week 2:  OT Short Term Goal 1 (Week 2): STG= LTG d/t ELOS  Skilled Therapeutic Interventions/Progress Updates:    Pt resting in recliner upon arrival, agreeable to therapy.  OT intervention with focus on RUE shoulder flexion with gravity eliminated. Pt squeezing plastic dowel with BUE and performing arm raises 4x8 with focus on straight elbows. Pt also practiced RUE open chain arm raises 4x8. Significant shoulder elevation with activities. Pt practiced shoulder flexion with internal rotation to touch contralateral shoulder 4x8. Pt amb without AD back to room while holding towels on RUE. Pt remained in recliner with alarm activated. All needs within reach.  Therapy Documentation Precautions:  Precautions Precautions: Fall Type of Shoulder Precautions: R hemi UE>LE Restrictions Weight Bearing Restrictions: No RUE Weight Bearing: Non weight bearing   Pain: Pt denies pain this afternoon    Therapy/Group: Individual Therapy  Rich Brave 10/19/2022, 1:36 PM

## 2022-10-19 NOTE — Progress Notes (Signed)
Physical Therapy Session Note  Patient Details  Name: Ricardo Yang MRN: [id number] Date of Birth: 08/20/1976  Today's Date: 10/19/2022 PT Individual Time: 1048-1200 PT Individual Time Calculation (min): 72 min   Short Term Goals: Week 1:  PT Short Term Goal 1 (Week 1): Patient will ambulate >31' with LRAD and CGA PT Short Term Goal 1 - Progress (Week 1): Met PT Short Term Goal 2 (Week 1): Patient will ascend/descend 12 steps with L HR and CGA PT Short Term Goal 2 - Progress (Week 1): Met PT Short Term Goal 3 (Week 1): Patient will performed stand pivot transfer with LRAD and CGA PT Short Term Goal 3 - Progress (Week 1): Met Week 2:  PT Short Term Goal 1 (Week 2): STGs = LTGs Week 3:    Week 4:     Skilled Therapeutic Interventions/Progress Updates:   Pt received sitting in recliner and agreeable to PT. Pt performed sit<>stand transfer with supervision assist from PT for safety. Gait training with no AD through hall of rehab unit x [id number], [id number] CGA for safety with min tactile cues for awareness of occasional balance loss. Dynamis gait training through agility ladder: 1 foot in each space x 6, side steppin x 3 Bil, diagonal stepping x 4, SLS with  3 sec hold x 4. Min assist from PT with cues for posture and step length on the RLE. Pt performed BITS used paced x 2 BUE with cues assist on the RUE to fullROM and initiation of shoulder flexion to touch target on the RUE.   Pt performed additional gait training through hospital and over cement sidewalk x [id number] CGA for safety and up/down 4 steps with supervision assist and cues for step to gait pattern. Seted rest breaks intermittently   Patient returned to room and Pt left sitting in recliner with call bell in reach and all needs met.          Therapy Documentation Precautions:  Precautions Precautions: Fall Type of Shoulder Precautions: R hemi UE>LE Restrictions Weight Bearing Restrictions: No RUE Weight  Bearing: Non weight bearing General:   Vital Signs:   Pain: Pain Assessment Pain Scale: 0-10 Pain Score: 0-No pain Mobility:   Locomotion :    Trunk/Postural Assessment :    Balance:   Exercises:   Other Treatments:      Therapy/Group: Individual Therapy  AustiLorie Phenix4/2023, 1:22 PM

## 2022-10-20 LAB — CBC
HCT: 50.3 % (ref 39.0–52.0)
Hemoglobin: 17 g/dL (ref 13.0–17.0)
MCH: 28.5 pg (ref 26.0–34.0)
MCHC: 33.8 g/dL (ref 30.0–36.0)
MCV: 84.3 fL (ref 80.0–100.0)
Platelets: 266 10*3/uL (ref 150–400)
RBC: 5.97 MIL/uL — ABNORMAL HIGH (ref 4.22–5.81)
RDW: 12.6 % (ref 11.5–15.5)
WBC: 6.7 10*3/uL (ref 4.0–10.5)
nRBC: 0 % (ref 0.0–0.2)

## 2022-10-20 LAB — BASIC METABOLIC PANEL
Anion gap: 9 (ref 5–15)
BUN: 15 mg/dL (ref 6–20)
CO2: 22 mmol/L (ref 22–32)
Calcium: 9.3 mg/dL (ref 8.9–10.3)
Chloride: 106 mmol/L (ref 98–111)
Creatinine, Ser: 0.97 mg/dL (ref 0.61–1.24)
GFR, Estimated: >60 mL/min (ref >60)
Glucose, Bld: 104 mg/dL — ABNORMAL HIGH (ref 70–99)
Potassium: 4.3 mmol/L (ref 3.5–5.1)
Sodium: 137 mmol/L (ref 135–145)

## 2022-10-20 NOTE — Progress Notes (Signed)
PROGRESS NOTE   Subjective/Complaints: No new complaints or concerns today.    Review of Systems  Constitutional:  Negative for fever and malaise/fatigue.  HENT:  Negative for congestion.   Respiratory:  Negative for shortness of breath.   Cardiovascular:  Negative for chest pain and leg swelling.  Gastrointestinal:  Positive for constipation. Negative for abdominal pain, nausea and vomiting.  Genitourinary: Negative.  Negative for dysuria.  Musculoskeletal:  Positive for joint pain (b/l knees, chronic, improving).  Skin:  Negative for rash.  Neurological:  Positive for sensory change and focal weakness (R arm). Negative for weakness.  Psychiatric/Behavioral:  Negative for depression. The patient has insomnia.     Objective:   No results found. Recent Labs    10/20/22 0707  WBC 6.7  HGB 17.0  HCT 50.3  PLT 266    Recent Labs    10/20/22 0707  NA 137  K 4.3  CL 106  CO2 22  GLUCOSE 104*  BUN 15  CREATININE 0.97  CALCIUM 9.3     Intake/Output Summary (Last 24 hours) at 10/20/2022 1115 Last data filed at 10/20/2022 0344 Gross per 24 hour  Intake 354 ml  Output 845 ml  Net -491 ml         Physical Exam: Vital Signs Blood pressure 114/73, pulse 66, temperature 97.9 F (36.6 C), resp. rate 19, height 6\' 3"  (1.905 m), weight 104.3 kg, SpO2 100 %.   General: NAD, laying in bed, comfortable appearing HEENT: Head is normocephalic, atraumatic, conjugate gaze, oral mucosa pink and moist,  Neck: Supple without JVD or lymphadenopathy Heart: Reg rate and rhythm. No murmurs rubs or gallops, holter monitor in place Chest: CTA bilaterally without wheezes, rales, or rhonchi; good air movement Abdomen: Soft, non-tender, non-distended, bowel sounds positive. Extremities: No clubbing, cyanosis, or edema. Pulses are 2+ Psych: Pt's affect is appropriate. Pt is cooperative. pleasant Skin: warm and dry Neuro:   alert and oriented x4, follows commands, CN 2-12 grossly intact, Mild aphasia. Fair judgement and insight.  Musculoskeletal: NO joint swelling or tenderness noted, moving all 4 extremities  Prior exam  Strength 5/5 in all LUE and LLE Strength 2-3/5 in RUE shoulder abduction, elbow extension and flexion, finger flexion Strength 4/5 proximal  5/5 distal RLE Sensation intact in all 4 extremities but decreased in RUE  Assessment/Plan: 1. Functional deficits which require 3+ hours per day of interdisciplinary therapy in a comprehensive inpatient rehab setting. Physiatrist is providing close team supervision and 24 hour management of active medical problems listed below. Physiatrist and rehab team continue to assess barriers to discharge/monitor patient progress toward functional and medical goals  Care Tool:  Bathing    Body parts bathed by patient: Right arm, Chest, Abdomen, Front perineal area, Buttocks, Right upper leg, Left upper leg, Right lower leg, Left lower leg, Face, Left arm   Body parts bathed by helper: Left arm     Bathing assist Assist Level: Minimal Assistance - Patient > 75%     Upper Body Dressing/Undressing Upper body dressing   What is the patient wearing?: Pull over shirt    Upper body assist Assist Level: Minimal Assistance - Patient > 75%  Lower Body Dressing/Undressing Lower body dressing      What is the patient wearing?: Pants     Lower body assist Assist for lower body dressing: Minimal Assistance - Patient > 75%     Toileting Toileting    Toileting assist Assist for toileting: Minimal Assistance - Patient > 75%     Transfers Chair/bed transfer  Transfers assist     Chair/bed transfer assist level: Contact Guard/Touching assist     Locomotion Ambulation   Ambulation assist      Assist level: Contact Guard/Touching assist Assistive device: No Device Max distance: 300+   Walk 10 feet activity   Assist     Assist level:  Contact Guard/Touching assist Assistive device: No Device   Walk 50 feet activity   Assist    Assist level: Contact Guard/Touching assist Assistive device: No Device    Walk 150 feet activity   Assist    Assist level: Contact Guard/Touching assist Assistive device: No Device    Walk 10 feet on uneven surface  activity   Assist     Assist level: Contact Guard/Touching assist Assistive device: Other (comment) (no device)   Wheelchair     Assist Is the patient using a wheelchair?: Yes Type of Wheelchair: Manual    Wheelchair assist level: Supervision/Verbal cueing Max wheelchair distance: >200'    Wheelchair 50 feet with 2 turns activity    Assist        Assist Level: Supervision/Verbal cueing   Wheelchair 150 feet activity     Assist      Assist Level: Supervision/Verbal cueing   Blood pressure 114/73, pulse 66, temperature 97.9 F (36.6 C), resp. rate 19, height 6\' 3"  (1.905 m), weight 104.3 kg, SpO2 100 %.  Medical Problem List and Plan: 1. 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  Antithrombotics: -DVT/anticoagulation:  Mechanical: Antiembolism stockings, thigh (TED hose) Bilateral lower extremities             -antiplatelet therapy: Aspirin 81 mg daily and Plavix 75 mg daily 3. Pain Management: Tylenol as needed 4. Mood/Behavior/Sleep: Provide emotional support             -antipsychotic agents: N/A  -see #16 5. Neuropsych/cognition: This patient is not capable of making decisions on his own behalf. 6. Skin/Wound Care: Routine skin checks 7. Fluids/Electrolytes/Nutrition: Routine in and outs with follow-up chemistries 8.  CAD/history of  PCI/chronic systolic congestive heart failure with ejection fraction 20 to 25%.   -Daily weights.  -TEE without thrombus.  Monitor for any signs of fluid overload.   -Continue Farxiga 10 mg daily.  Continue asa and plavix.    -Follow-up outpatient cardiology services -continue Holter monitor, called company as holter monitor blinking red, called company- noted to be bad contact with skin, contact improved and it turned green, continue to monitor -Wt a little down today, continue to monitor Filed Weights   10/17/22 0403 10/18/22 0435 10/20/22 0500  Weight: 107.3 kg 107.4 kg 104.3 kg    9.  Hypertension.  Lisinopril 2.5 mg  daily, Toprol-XL 12.5 mg daily.   Monitor with increased mobility  -12/25 well controlled     10/20/2022    5:00 AM 10/20/2022    3:43 AM 10/19/2022    7:49 PM  Vitals with BMI  Weight 229 lbs 15 oz    BMI [id number]    Systolic  [id number]  Diastolic  73 71  Pulse  66 71   10.  Hyperlipidemia.  Lipitor 80mg  daily. Heart healthy diet 11.  History of tobacco as well as polysubstance use.  Urine drug screen positive marijuana as well as cocaine.  Provide counseling 12.  Obesity.  BMI 30.70.  Dietary follow-up 13. Leukocytosis: monitor for s/sx of infection  -WNL 12/18  -Monitor on weekly labs 10/20/22 14. Constipation: Miralax QD ordered  -Sorbitol 70% 41mL QD PRN ordered and helped significantly  -12/22 LBM yesterday-improved, continue current regimen -12/23 no BM since 12/21 but denies complaints, knows he has meds ordered if needed, continue current regimen -10/19/22 no BM today so took sorbitol, awaiting response; continue to monitor, encourage PO hydration -12/25 LBM yesterday-improved 15. Chronic b/l knee pain: reports some increased pain in popliteal fossas with increased mobility, no acute complaints/concerns, usually takes BC powder at home  -Tylenol PRN ordered  -Ordered Voltaren 1% gel 2g QID PRN-- not used, knows to ask if needed 16. Insomnia: difficulty  staying asleep  -Melatonin 3mg  QHS ordered  -12/22 Ordered Trazodone 25mg  QHS PRN-- not used, knows to ask      LOS: 11 days A FACE TO [place] 10/20/2022, 11:15 AM

## 2022-10-21 NOTE — Progress Notes (Signed)
Speech Language Pathology Discharge Summary  Patient Details  Name: Ricardo Yang MRN: [id number] Date of Birth: 01/05/1976  Date of Discharge from SLP service:October 21, 2022  Today's Date: 10/21/2022 SLP Individual Time: 1250-1335 SLP Individual Time Calculation (min): 45 min   Skilled Therapeutic Interventions:  Skilled ST services focused on education and speech/language skills. Pt expressed difficulty in occasional word finding and "getting tongue tied" very mild motor planning impairments. SLP facilitated word finding skills in convergent naming 100% accuracy, opposites 100% accuracy, divergent naming requiring supervision A semantic cues and definition formation requiring supervision A semantic cues fade to mod I. Pt demonstrated occasional halting/ misarticulation of words in conversation, however was able to self correct with strategy to slow rate. SLP educated pt on semantic feature analysis for word finding. All questions answered to satisfaction. Pt was left with call bell within reach and chair alarm set.     Patient has met 3 of 3 long term goals.  Patient to discharge at overall Modified Independent level.  Reasons goals not met:     Clinical Impression/Discharge Summary:   Pt made great progress meeting 3 out 3 goals, discharging at mod I for speech and language. Pt initially demonstrated mild word finding and motor planning deficits. SLP facilitated education on word finding strategies (semantic feature analysis) and focused on slowing rate during speech to increase speech intelligibility. Education was completed. Pt benefited from skilled ST services in order to maximize functional independence and reduce burden of care, pt could seek OPST services if desired.   Care Partner:  Caregiver Able to Provide Assistance: Yes  Type of Caregiver Assistance: Physical  Recommendation:  Outpatient SLP  Rationale for SLP Follow Up: Maximize functional communication   Equipment: N/A    Reasons for discharge: Discharged from hospital   Patient/Family Agrees with Progress Made and Goals Achieved: Yes    MADISON  CRATCH 10/21/2022, 1:58 PM  

## 2022-10-21 NOTE — Progress Notes (Signed)
Occupational Therapy Session Note  Patient Details  Name: Ricardo Yang MRN: [id number] Date of Birth: 08/25/1976  Session 1 Today's Date: 10/21/2022 OT Individual Time: 0175-1025 OT Individual Time Calculation (min): 49 min  and Today's Date: 10/21/2022 OT Missed Time: 10 Minutes Missed Time Reason: Other (comment) (OT late from previous session)   Session 2 Today's Date: 10/21/2022 OT Individual Time: 1332-1400 OT Individual Time Calculation (min): 28 min    Short Term Goals: Week 2:  OT Short Term Goal 1 (Week 2): STG= LTG d/t ELOS  Skilled Therapeutic Interventions/Progress Updates:    Session 1 Pt received supine with no c/o pain, agreeable to OT session. He completed bed mobility with (S) to EOB. Functional mobility into the bathroom at distant (S) level. Min cueing for safety doffing clothing standing instead of sitting. Pt completed bathing seated on TTB with (S) overall. He transferred back to EOB with (S). He donned all clothes with (S) overall. He requested to complete laundry. Vc and min positioning for pt to hold his clothes bag with his RUE and LUE acting as stabilizer during the 200 ft of functional mobility to the therapy gym. Washing machine was in use but simulated laundry and he was able to load with (S). In the therapy gym he completed  forward reaching with OT facilitating extension of the fingers to reduce flexor synergy pattern. CVA recovery education provided throughout. Pt then completed closed chain forward reaching in all planes to facilitate shoulder flexion with less abduction/trap elevation compensation. Pt returned to his room and was left sitting up with all needs met.    Session 2 Pt received sitting in the recliner with no c/o pain, agreeable to OT session. He completed 200 ft of functional mobility to the therapy gym with (S) overall. Pt completed various closed chain therapeutic activities with bimanual integration. Dual processing component also added  with increased cueing required for attention to the RUE. He demonstrated ability to reach fully overhead with both hands on a laundry basket, with OT facilitating reduction of shoulder abduction. He completed bimanual weightbearing task with modified push ups on the mat with single arm support added as well with mod A to prevent arm buckling. Pt ended with transferring clothes from washing machine to the dryer with close (S). He returned to his room and was left sitting up in the recliner with all needs met.     Therapy Documentation Precautions:  Precautions Precautions: Fall Type of Shoulder Precautions: R hemi UE>LE Restrictions Weight Bearing Restrictions: No RUE Weight Bearing: Non weight bearing  Therapy/Group: Individual Therapy  [name] 10/21/2022, 6:55 AM

## 2022-10-21 NOTE — Progress Notes (Signed)
PROGRESS NOTE   Subjective/Complaints: Sitting in bed watching TV. No new concerns or complaints today.   Review of Systems  Constitutional:  Negative for fever.  HENT:  Negative for congestion.   Eyes:  Negative for double vision.  Respiratory:  Negative for shortness of breath.   Cardiovascular:  Negative for chest pain.  Gastrointestinal:  Negative for abdominal pain, nausea and vomiting.  Genitourinary: Negative.  Negative for dysuria.  Musculoskeletal:  Positive for joint pain (b/l knees, chronic, improving).  Neurological:  Positive for sensory change and focal weakness (R arm). Negative for dizziness and weakness.  Psychiatric/Behavioral:  Negative for depression. The patient has insomnia.     Objective:   No results found. Recent Labs    10/20/22 0707  WBC 6.7  HGB 17.0  HCT 50.3  PLT 266    Recent Labs    10/20/22 0707  NA 137  K 4.3  CL 106  CO2 22  GLUCOSE 104*  BUN 15  CREATININE 0.97  CALCIUM 9.3     Intake/Output Summary (Last 24 hours) at 10/21/2022 1043 Last data filed at 10/21/2022 0740 Gross per 24 hour  Intake 720 ml  Output 1400 ml  Net -680 ml         Physical Exam: Vital Signs Blood pressure 120/79, pulse 68, temperature 98 F (36.7 C), temperature source Oral, resp. rate 18, height 6\' 3"  (1.905 m), weight 104.2 kg, SpO2 98 %.   General: NAD,  comfortable appearing HEENT: Head is normocephalic, atraumatic, conjugate gaze, oral mucosa pink and moist,  Neck: Supple without JVD or lymphadenopathy Heart: Reg rate and rhythm. No murmurs rubs or gallops, holter monitor in place Chest: CTA bilaterally without wheezes, rales, or rhonchi; good air movement Abdomen: Soft, non-tender, non-distended, bowel sounds positive. Extremities: No clubbing, cyanosis, or edema. Pulses are 2+ Psych: Pt's affect is appropriate. Pt is cooperative. pleasant Skin: warm and dry, no breakdown  noted Neuro:  alert and oriented x4, follows commands, CN 2-12 grossly intact, Mild aphasia-improving. Fair judgement and insight.  Musculoskeletal: NO joint swelling or tenderness noted, moving all 4 extremities  Prior exam  Strength 5/5 in all LUE and LLE Strength 2-3/5 in RUE shoulder abduction, elbow extension and flexion, finger flexion Strength 4/5 proximal  5/5 distal RLE Sensation intact in all 4 extremities but decreased in RUE  Assessment/Plan: 1. Functional deficits which require 3+ hours per day of interdisciplinary therapy in a comprehensive inpatient rehab setting. Physiatrist is providing close team supervision and 24 hour management of active medical problems listed below. Physiatrist and rehab team continue to assess barriers to discharge/monitor patient progress toward functional and medical goals  Care Tool:  Bathing    Body parts bathed by patient: Right arm, Chest, Abdomen, Front perineal area, Buttocks, Right upper leg, Left upper leg, Right lower leg, Left lower leg, Face, Left arm   Body parts bathed by helper: Left arm     Bathing assist Assist Level: Minimal Assistance - Patient > 75%     Upper Body Dressing/Undressing Upper body dressing   What is the patient wearing?: Pull over shirt    Upper body assist Assist Level: Minimal Assistance -  Patient > 75%    Lower Body Dressing/Undressing Lower body dressing      What is the patient wearing?: Pants     Lower body assist Assist for lower body dressing: Minimal Assistance - Patient > 75%     Toileting Toileting    Toileting assist Assist for toileting: Minimal Assistance - Patient > 75%     Transfers Chair/bed transfer  Transfers assist     Chair/bed transfer assist level: Contact Guard/Touching assist     Locomotion Ambulation   Ambulation assist      Assist level: Contact Guard/Touching assist Assistive device: No Device Max distance: 300+   Walk 10 feet  activity   Assist     Assist level: Contact Guard/Touching assist Assistive device: No Device   Walk 50 feet activity   Assist    Assist level: Contact Guard/Touching assist Assistive device: No Device    Walk 150 feet activity   Assist    Assist level: Contact Guard/Touching assist Assistive device: No Device    Walk 10 feet on uneven surface  activity   Assist     Assist level: Contact Guard/Touching assist Assistive device: Other (comment) (no device)   Wheelchair     Assist Is the patient using a wheelchair?: Yes Type of Wheelchair: Manual    Wheelchair assist level: Supervision/Verbal cueing Max wheelchair distance: >200'    Wheelchair 50 feet with 2 turns activity    Assist        Assist Level: Supervision/Verbal cueing   Wheelchair 150 feet activity     Assist      Assist Level: Supervision/Verbal cueing   Blood pressure 120/79, pulse 68, temperature 98 F (36.7 C), temperature source Oral, resp. rate 18, height 6\' 3"  (1.905 m), weight 104.2 kg, SpO2 98 %.  Medical Problem List and Plan: 1. Functional deficits secondary to left MCA territory infarction             -patient may  shower             -ELOS/Goals: 10-12 days, mod I PT/OT, ind SLP             -CIR with PT/OT/SLP   -Ambulating 300+ feet  -Seen by neuropsych working on coping, appreciate assistance 2.  Antithrombotics: -DVT/anticoagulation:  Mechanical: Antiembolism stockings, thigh (TED hose) Bilateral lower extremities             -antiplatelet therapy: Aspirin 81 mg daily and Plavix 75 mg daily 3. Pain Management: Tylenol as needed 4. Mood/Behavior/Sleep: Provide emotional support             -antipsychotic agents: N/A  -see #16 5. Neuropsych/cognition: This patient is not capable of making decisions on his own behalf. 6. Skin/Wound Care: Routine skin checks 7. Fluids/Electrolytes/Nutrition: Routine in and outs with follow-up chemistries 8.  CAD/history of  PCI/chronic systolic congestive heart failure with ejection fraction 20 to 25%.   -Daily weights.  -TEE without thrombus.  Monitor for any signs of fluid overload.   -Continue Farxiga 10 mg daily.  Continue asa and plavix.    -Follow-up outpatient cardiology services -continue Holter monitor, called company as holter monitor blinking red, called company- noted to be bad contact with skin, contact improved and it turned green, continue to monitor -Wt appears stable today, continue to monitor Filed Weights   10/18/22 0435 10/20/22 0500 10/21/22 0500  Weight: 107.4 kg 104.3 kg 104.2 kg    9.  Hypertension.  Lisinopril 2.5 mg daily, Toprol-XL  12.5 mg daily.   Monitor with increased mobility  -12/26 well controlled    10/21/2022    5:00 AM 10/21/2022    4:04 AM 10/20/2022    7:06 PM  Vitals with BMI  Weight 229 lbs 12 oz    BMI 28.71    Systolic  120 115  Diastolic  79 70  Pulse  68 77   10.  Hyperlipidemia.  Lipitor 80mg  daily. Heart healthy diet 11.  History of tobacco as well as polysubstance use.  Urine drug screen positive marijuana as well as cocaine.  Provide counseling 12.  Obesity.  BMI 30.70.  Dietary follow-up 13. Leukocytosis: monitor for s/sx of infection  -WNL 12/18  -Monitor on weekly labs 10/20/22 14. Constipation: Miralax QD ordered  -Sorbitol 70% 77mL QD PRN ordered and helped significantly  -12/22 LBM yesterday-improved, continue current regimen -12/23 no BM since 12/21 but denies complaints, knows he has meds ordered if needed, continue current regimen -10/19/22 no BM today so took sorbitol, awaiting response; continue to monitor, encourage PO hydration -12/25 LBM yesterday-improved 15. Chronic b/l knee pain: reports some increased pain in popliteal fossas with increased mobility, no acute complaints/concerns, usually takes BC powder at home  -Tylenol PRN ordered  -Ordered Voltaren 1% gel 2g QID PRN-- not used, knows to ask if needed 16. Insomnia: difficulty  staying asleep  -Melatonin 3mg  QHS ordered  -12/22 Ordered Trazodone 25mg  QHS PRN-- not used, knows to ask      LOS: 12 days A FACE TO FACE EVALUATION WAS PERFORMED  10/21/2022, 10:43 AM

## 2022-10-21 NOTE — Progress Notes (Signed)
Occupational Therapy Discharge Summary  Patient Details  Name: Ricardo Yang MRN: [id number] Date of Birth: [birth year]  Date of Discharge from [place] 27, 2023   Patient has met 10 of 10 long term goals due to improved activity tolerance, improved balance, postural control, ability to compensate for deficits, functional use of  RIGHT upper and RIGHT lower extremity, improved attention, improved awareness, and improved coordination.  Patient to discharge at overall Supervision level.  Patient's care partner is independent to provide the necessary physical assistance at discharge. Ricardo has made excellent progress in CIR and progressed to (S)- mod I level overall. His RUE has progressed significantly from a brunnstrom III to a V.    Recommendation:  Patient will benefit from ongoing skilled OT services in outpatient setting to continue to advance functional skills in the area of BADL and iADL.  Equipment: No equipment provided  Reasons for discharge: treatment goals met and discharge from hospital  Patient/family agrees with progress made and goals achieved: Yes  OT Discharge Precautions/Restrictions  Precautions Precautions: Fall ADL ADL Eating: Supervision/safety Where Assessed-Eating: Edge of bed Grooming: Supervision/safety Where Assessed-Grooming: Sitting at sink Upper Body Bathing: Supervision/safety Where Assessed-Upper Body Bathing: Shower Lower Body Bathing: Supervision/safety Where Assessed-Lower Body Bathing: Shower Upper Body Dressing: Supervision/safety Where Assessed-Upper Body Dressing: Edge of bed Lower Body Dressing: Supervision/safety Where Assessed-Lower Body Dressing: Edge of bed Toileting: Supervision/safety Where Assessed-Toileting: Glass blower/designer: Distant supervision Armed forces technical officer Method: Counselling psychologist: Ambulance person Transfer: Distant supervision Tub/Shower Transfer Method: Engineer, technical sales: Facilities manager: Distant supervision Social research officer, government Method: Heritage manager: Gaffer Baseline Vision/History: 1 Wears glasses Patient Visual Report: No change from baseline Vision Assessment?: No apparent visual deficits Perception  Perception: Impaired Inattention/Neglect: Does not attend to right side of body (greatly improved, min cueing occasionally) Praxis Praxis: Intact Cognition Cognition Overall Cognitive Status: Within Functional Limits for tasks assessed Arousal/Alertness: Awake/alert Memory: Appears intact Selective Attention: Appears intact Awareness: Appears intact Problem Solving: Appears intact Safety/Judgment: Appears intact Brief Interview for Mental Status (BIMS) Repetition of Three Words (First Attempt): 3 Temporal Orientation: Year: Correct Temporal Orientation: Month: Accurate within 5 days Temporal Orientation: Day: Correct Recall: "Sock": Yes, no cue required Recall: "Blue": Yes, no cue required Recall: "Bed": Yes, no cue required BIMS Summary Score: 15 Sensation Sensation Light Touch: Impaired Detail Central sensation comments: Light touch impaired in the RUE- difficulty localizing on forearm Coordination Gross Motor Movements are Fluid and Coordinated: No Fine Motor Movements are Fluid and Coordinated: No Coordination and Movement Description: Greatly improved, still with RUE hemi that impacts coordination Motor  Motor Motor: Hemiplegia Motor - Discharge Observations: R hemi, greatly improved Mobility  Bed Mobility Bed Mobility: Rolling Right;Rolling Left;Supine to Sit;Sit to Supine Rolling Right: Independent Rolling Left: Independent Supine to Sit: Independent Sit to Supine: Independent Transfers Sit to Stand: Independent Stand to Sit: Independent  Trunk/Postural Assessment  Cervical Assessment Cervical Assessment: Within Functional Limits Thoracic  Assessment Thoracic Assessment: Within Functional Limits Lumbar Assessment Lumbar Assessment: Within Functional Limits Postural Control Postural Control: Deficits on evaluation Righting Reactions: Righting reactions delayed  Balance Balance Balance Assessed: Yes Static Sitting Balance Static Sitting - Balance Support: Feet supported Static Sitting - Level of Assistance: 6: Modified independent (Device/Increase time) Dynamic Sitting Balance Dynamic Sitting - Balance Support: Feet supported Dynamic Sitting - Level of Assistance: 6: Modified independent (Device/Increase time) Static Standing Balance Static Standing - Balance Support: During functional activity  Static Standing - Level of Assistance: 6: Modified independent (Device/Increase time) Dynamic Standing Balance Dynamic Standing - Balance Support: During functional activity Dynamic Standing - Level of Assistance: 5: Stand by assistance Extremity/Trunk Assessment RUE Assessment RUE Assessment: Exceptions to [facility] General Strength Comments: Still with flexor synergy pattern but able to achieve full AROM RUE Body System: Neuro Brunstrum levels for arm and hand: Arm;Hand Brunstrum level for arm: Stage V Relative Independence from Synergy Brunstrum level for hand: Stage IV Movements deviating from synergies LUE Assessment LUE Assessment: Within Functional Limits   [name] 10/21/2022, 11:44 AM  Ailene Ravel, OTR/L,CBIS  Supplemental OT - [facility] and Dirk Dress 10/22/22

## 2022-10-21 NOTE — Progress Notes (Signed)
Physical Therapy Session Note  Patient Details  Name: Ricardo Yang MRN: [id number] Date of Birth: 03/12/1976  Today's Date: 10/21/2022 PT Individual Time: 1445-1530 PT Individual Time Calculation (min): 45 min   Short Term Goals: Week 1:  PT Short Term Goal 1 (Week 1): Patient will ambulate >150' with LRAD and CGA PT Short Term Goal 1 - Progress (Week 1): Met PT Short Term Goal 2 (Week 1): Patient will ascend/descend 12 steps with L HR and CGA PT Short Term Goal 2 - Progress (Week 1): Met PT Short Term Goal 3 (Week 1): Patient will performed stand pivot transfer with LRAD and CGA PT Short Term Goal 3 - Progress (Week 1): Met  Skilled Therapeutic Interventions/Progress Updates:    Pt received in recliner and agreeable to therapy.  No complaint of pain. Pt ambulated to and from gym with CGA, fading to close supervision by end of session, with no AD. Pt first directed in single arm pushups in modified plantigrade with LUE. Progressed to quadruped pushups, 4 x 8 with pt transitioning quad<>talk kneeling with CGA and assist for L hand placement. Pt then instructed in floor transfer. Discussed activating EMS in case of injury. Pt then performed half kneel<>stand 2 x 6 with RLE in front and LUE supported on mat table. Attempted wit LLE front, but pt required min A and greatly increased effort to perform, only able transfer onto table and not to full stand. Pt then requested to retrieve laundry. Pt walked > 200 ft to laundry room and retrieved bag of clothes, carrying back to room. Pt then unloaded bag into dresser drawers. Pt remained in recliner with NT present to assist pt with changing clothes and taking vitals.   Therapy Documentation Precautions:  Precautions Precautions: Fall Type of Shoulder Precautions: R hemi UE>LE Restrictions Weight Bearing Restrictions: No RUE Weight Bearing: Non weight bearing General:     Therapy/Group: Individual Therapy  Olivia C Brittain 10/21/2022, 1:25  PM  

## 2022-10-21 NOTE — Discharge Summary (Signed)
Physician Discharge Summary  Patient ID: Ricardo Yang MRN: [id number] DOB/AGE: November 22, 1975 46 y.o.  Admit date: 10/09/2022 Discharge date: 10/23/2022  Discharge Diagnoses:  Principal Problem:   Left middle cerebral artery stroke [facility]) Active Problems:   History of substance abuse (HCC)   Insomnia   Slow transit constipation   Chronic pain of both knees CAD/history of PCI Chronic systolic congestive heart failure Hypertension Hyperlipidemia History of tobacco polysubstance abuse Obesity  Discharged Condition: Stable  Significant Diagnostic Studies: ECHO TEE  Result Date: 10/08/2022    TRANSESOPHOGEAL ECHO REPORT   Patient Name:   Ricardo Yang Date of Exam: 10/08/2022 Medical Rec #:  [id number]     Height:       75.0 in Accession #:    [id number]    Weight:       245.6 lb Date of Birth:  [birth year]      BSA:          2.395 m Patient Age:    46 years      BP:           132/96 mmHg Patient Gender: M             HR:           81 bpm. Exam Location:  [facility] Procedure: Transesophageal Echo, Cardiac Doppler and Color Doppler Indications:     Cerebral Infarction, unspecified I63.9  History:         Patient has prior history of Echocardiogram examinations, most                  recent 10/05/2022. CAD and Previous Myocardial Infarction.                  Polysubstance abuse.  Sonographer:     Leta Jungling RDCS Referring Phys:  [id number] Debbe Odea Diagnosing Phys: Debbe Odea MD PROCEDURE: After discussion of the risks and benefits of a TEE, an informed consent was obtained from the patient. TEE procedure time was 10 minutes. The transesophogeal probe was passed without difficulty through the esophogus of the patient. Imaged were obtained with the patient in a left lateral decubitus position. Sedation performed by performing physician. Patients was under conscious sedation during this procedure. Anesthetic administered: of Fentanyl, 1.0mg  of Versed. The patient developed no  complications during the procedure. Transgastric views not obtained.  IMPRESSIONS  1. Left ventricular ejection fraction, by estimation, is 20 to 25%. The left ventricle has severely decreased function. The left ventricle demonstrates global hypokinesis. The left ventricular internal cavity size was mildly to moderately dilated.  2. Right ventricular systolic function is severely reduced. The right ventricular size is normal.  3. No left atrial/left atrial appendage thrombus was detected. The LAA emptying velocity was 32 cm/s.  4. The mitral valve is normal in structure. Mild mitral valve regurgitation.  5. The aortic valve is tricuspid. Aortic valve regurgitation is trivial.  6. Agitated saline contrast bubble study was negative, with no evidence of any interatrial shunt. Conclusion(s)/Recommendation(s): No LA/LAA thrombus identified. Negative bubble study for interatrial shunt. No intracardiac source of embolism detected on this on this transesophageal echocardiogram. FINDINGS  Left Ventricle: Left ventricular ejection fraction, by estimation, is 20 to 25%. The left ventricle has severely decreased function. The left ventricle demonstrates global hypokinesis. The left ventricular internal cavity size was mildly to moderately dilated. Right Ventricle: The right ventricular size is normal. No increase in right ventricular wall thickness. Right ventricular systolic function is severely reduced.  Left Atrium: Left atrial size was not well visualized. No left atrial/left atrial appendage thrombus was detected. The LAA emptying velocity was 32 cm/s. Right Atrium: Right atrial size was normal in size. Pericardium: There is no evidence of pericardial effusion. Mitral Valve: The mitral valve is normal in structure. Mild mitral valve regurgitation. Tricuspid Valve: The tricuspid valve is normal in structure. Tricuspid valve regurgitation is mild. Aortic Valve: The aortic valve is tricuspid. Aortic valve regurgitation is  trivial. Pulmonic Valve: The pulmonic valve was normal in structure. Pulmonic valve regurgitation is not visualized. Aorta: The aortic root is normal in size and structure. IAS/Shunts: No atrial level shunt detected by color flow Doppler. Agitated saline contrast was given intravenously to evaluate for intracardiac shunting. Agitated saline contrast bubble study was negative, with no evidence of any interatrial shunt. Additional Comments: Spectral Doppler performed. TRICUSPID VALVE TR Peak grad:   44.4 mmHg TR Vmax:        333.00 cm/s Debbe Odea MD Electronically signed by Debbe Odea MD Signature Date/Time: 10/08/2022/3:19:31 PM    Final    ECHOCARDIOGRAM COMPLETE  Result Date: 10/06/2022    ECHOCARDIOGRAM REPORT   Patient Name:   Ricardo Yang Date of Exam: 10/05/2022 Medical Rec #:  [id number]     Height:       75.0 in Accession #:    [id number]    Weight:       240.5 lb Date of Birth:  [birth year]      BSA:          2.373 m Patient Age:    46 years      BP:           130/85 mmHg Patient Gender: M             HR:           87 bpm. Exam Location:  [facility] Procedure: 2D Echo, Cardiac Doppler, Color Doppler and Intracardiac            Opacification Agent Indications:     Stroke  History:         Patient has prior history of Echocardiogram examinations. CAD,                  Stroke; Risk Factors:Hypertension and Polysubstance Abuse.  Sonographer:     L. Thornton-Maynard Referring Phys:  [id number] Elwyn Lade AGBATA Diagnosing Phys: Alwyn Pea MD  Sonographer Comments: Suboptimal apical window. IMPRESSIONS  1. Left ventricular ejection fraction, by estimation, is <20%. The left ventricle has severely decreased function. The left ventricle demonstrates global hypokinesis. The left ventricular internal cavity size was severely dilated. Left ventricular diastolic function could not be evaluated.  2. Right ventricular systolic function is mildly reduced. The right ventricular size is mildly enlarged. Mildly  increased right ventricular wall thickness. There is normal pulmonary artery systolic pressure.  3. Left atrial size was mildly dilated.  4. Right atrial size was mildly dilated.  5. The mitral valve is normal in structure. Mild mitral valve regurgitation.  6. The aortic valve is normal in structure. Aortic valve regurgitation is trivial. FINDINGS  Left Ventricle: Left ventricular ejection fraction, by estimation, is <20%. The left ventricle has severely decreased function. The left ventricle demonstrates global hypokinesis. Definity contrast agent was given IV to delineate the left ventricular endocardial borders. The left ventricular internal cavity size was severely dilated. There is no left ventricular hypertrophy. Left ventricular diastolic function could not be evaluated. Right Ventricle: The right ventricular  size is mildly enlarged. Mildly increased right ventricular wall thickness. Right ventricular systolic function is mildly reduced. There is normal pulmonary artery systolic pressure. The tricuspid regurgitant velocity is 2.25 m/s, and with an assumed right atrial pressure of 3 mmHg, the estimated right ventricular systolic pressure is 23.2 mmHg. Left Atrium: Left atrial size was mildly dilated. Right Atrium: Right atrial size was mildly dilated. Pericardium: There is no evidence of pericardial effusion. Mitral Valve: The mitral valve is normal in structure. Mild mitral valve regurgitation. MV peak gradient, 3.6 mmHg. The mean mitral valve gradient is 2.0 mmHg. Tricuspid Valve: The tricuspid valve is normal in structure. Tricuspid valve regurgitation is mild. Aortic Valve: The aortic valve is normal in structure. Aortic valve regurgitation is trivial. Aortic valve mean gradient measures 2.0 mmHg. Aortic valve peak gradient measures 3.6 mmHg. Aortic valve area, by VTI measures 3.50 cm. Pulmonic Valve: The pulmonic valve was normal in structure. Pulmonic valve regurgitation is not visualized. Aorta: The  ascending aorta was not well visualized. IAS/Shunts: No atrial level shunt detected by color flow Doppler.  LEFT VENTRICLE PLAX 2D LVIDd:         7.00 cm      Diastology LVIDs:         6.25 cm      LV e' medial:    5.00 cm/s LV PW:         0.80 cm      LV E/e' medial:  17.6 LV IVS:        0.70 cm      LV e' lateral:   6.85 cm/s LVOT diam:     2.40 cm      LV E/e' lateral: 12.9 LV SV:         55 LV SV Index:   23 LVOT Area:     4.52 cm  LV Volumes (MOD) LV vol d, MOD A2C: 188.0 ml LV vol d, MOD A4C: 189.0 ml LV vol s, MOD A2C: 136.0 ml LV vol s, MOD A4C: 188.0 ml LV SV MOD A2C:     52.0 ml LV SV MOD A4C:     189.0 ml LV SV MOD BP:      25.2 ml RIGHT VENTRICLE RV Basal diam:  3.60 cm RV S prime:     17.40 cm/s TAPSE (M-mode): 2.0 cm LEFT ATRIUM              Index        RIGHT ATRIUM           Index LA diam:        4.20 cm  1.77 cm/m   RA Area:     14.70 cm LA Vol (A2C):   113.0 ml 47.61 ml/m  RA Volume:   31.10 ml  13.10 ml/m LA Vol (A4C):   68.2 ml  28.74 ml/m LA Biplane Vol: 88.4 ml  37.25 ml/m  AORTIC VALVE                    PULMONIC VALVE AV Area (Vmax):    3.81 cm     PV Vmax:       0.91 m/s AV Area (Vmean):   3.61 cm     PV Peak grad:  3.3 mmHg AV Area (VTI):     3.50 cm AV Vmax:           94.95 cm/s AV Vmean:          66.250 cm/s AV VTI:  0.156 m AV Peak Grad:      3.6 mmHg AV Mean Grad:      2.0 mmHg LVOT Vmax:         80.03 cm/s LVOT Vmean:        52.833 cm/s LVOT VTI:          0.121 m LVOT/AV VTI ratio: 0.77  AORTA Ao Root diam: 4.00 cm Ao Asc diam:  3.20 cm MITRAL VALVE                  TRICUSPID VALVE MV Area (PHT): 6.51 cm       TR Peak grad:   20.2 mmHg MV Area VTI:   2.78 cm       TR Vmax:        225.00 cm/s MV Peak grad:  3.6 mmHg MV Mean grad:  2.0 mmHg       SHUNTS MV Vmax:       0.94 m/s       Systemic VTI:  0.12 m MV Vmean:      58.3 cm/s      Systemic Diam: 2.40 cm MV Decel Time: 117 msec MR Peak grad:    66.6 mmHg MR Mean grad:    44.0 mmHg MR Vmax:         408.00 cm/s MR  Vmean:        311.0 cm/s MR PISA:         1.57 cm MR PISA Eff ROA: 14 mm MR PISA Radius:  0.50 cm MV E velocity: 88.07 cm/s Dwayne D Callwood MD Electronically signed by Alwyn Pea MD Signature Date/Time: 10/06/2022/4:41:33 PM    Final    MR BRAIN WO CONTRAST  Result Date: 10/04/2022 CLINICAL DATA:  Stroke suspected EXAM: MRI HEAD WITHOUT CONTRAST TECHNIQUE: Multiplanar, multiecho pulse sequences of the brain and surrounding structures were obtained without intravenous contrast. COMPARISON:  Same day CT brain and head/neck angiogram. MRI Brain 05/21/2009 FINDINGS: Brain: Acute infarcts in the left ACA, left MCA, and likely the right PCA territory infarcts. There is a large infarct involving the left postcentral gyrus (series 5002, image 42). Additional infarcts are also seen involving the medial cortex of the left frontal lobe and lateral aspect of the left frontal lobe (series 5002, image 31). There is also a small infarct in the posterior right temporal lobe (series 5002, image 27). Redemonstrated periventricular and subcortical T2/FLAIR hyperintense signal, some of which appear triangular and perpendicular (series 40981, image 30), unchanged from prior. There is a focal region in the right occipital lobe where there is incomplete suppression of FLAIR signal (sereis 15001, image 28) with associated linear susceptibility artifact (series 19147, image 27). Small amount of T2/FLAIR hyperintense signal is also seen at the root entry zone of the right trigeminal nerve (series 82956, image 16). No extra-axial fluid collection. No hydrocephalus. Vascular: Normal flow voids. Skull and upper cervical spine: Normal marrow signal. Sinuses/Orbits: Negative. Other: None. IMPRESSION: 1. Acute infarcts in the left ACA, left MCA, and likely the right PCA territory, with a large infarct involving the left postcentral gyrus. Given distribution, these are favored to represent infarcts related to a central embolic  etiology. There is a focal region in the right occipital lobe where there is likely a small amount of subarachnoid blood products, which could suggest an underlying component of vasculitis. Recommend further evaluation with echocardiography. 2. Redemonstrated periventricular and subcortical T2/FLAIR hyperintense signal, as well as at the root entry zone of the  right trigeminal nerve, which are suggestive of an underlying demyelinating disease. Electronically Signed   By: Lorenza Cambridge M.D.   On: 10/04/2022 13:23   DG Abd 1 View  Result Date: 10/04/2022 CLINICAL DATA:  Screening for metallic foreign body EXAM: ABDOMEN - 1 VIEW COMPARISON:  None Available. FINDINGS: Nonobstructive bowel gas pattern. Contrast opacify the urinary bladder. Lung bases are clear. No metallic foreign body is visualized. No acute osseous abnormality. IMPRESSION: No metallic foreign body is visualized. Electronically Signed   By: Lorenza Cambridge M.D.   On: 10/04/2022 12:02   DG Chest 1 View  Result Date: 10/04/2022 CLINICAL DATA:  Stroke EXAM: CHEST  1 VIEW COMPARISON:  CXR 04/20/21, CXR 08/06/19 FINDINGS: No pleural effusion. No pneumothorax. No focal airspace opacity. Low lung volumes. There is a least mild interval increase in size of the cardiac contours compared to 04/20/21 even when accounting for differences in technique and lung volumes. No focal airspace opacity. No metallic foreign body. Visualized upper abdomen is unremarkable. IMPRESSION: 1. No metallic foreign body. 2. There is a least mild interval increase in size of the cardiac contours compared to 04/20/21 even when accounting for differences in technique and lung volumes. Consider further evaluation with echocardiography. Electronically Signed   By: Lorenza Cambridge M.D.   On: 10/04/2022 11:59   CT ANGIO HEAD NECK W WO CM W PERF (CODE STROKE)  Result Date: 10/04/2022 CLINICAL DATA:  46 year old male code stroke presentation suspicious for Left hemisphere ELVO. EXAM: CT  ANGIOGRAPHY HEAD AND NECK CT PERFUSION BRAIN TECHNIQUE: Multidetector CT imaging of the head and neck was performed using the standard protocol during bolus administration of intravenous contrast. Multiplanar CT image reconstructions and MIPs were obtained to evaluate the vascular anatomy. Carotid stenosis measurements (when applicable) are obtained utilizing NASCET criteria, using the distal internal carotid diameter as the denominator. Multiphase CT imaging of the brain was performed following IV bolus contrast injection. Subsequent parametric perfusion maps were calculated using RAPID software. RADIATION DOSE REDUCTION: This exam was performed according to the departmental dose-optimization program which includes automated exposure control, adjustment of the mA and/or kV according to patient size and/or use of iterative reconstruction technique. CONTRAST:  100 mL Omnipaque 350 COMPARISON:  Plain head CT 0430 hours. FINDINGS: CT Brain Perfusion Findings: ASPECTS: 10 CBF (<30%) Volume: 3mL Perfusion (Tmax>6.0s) volume: 69mL, however, some of this is bilateral and even registers in the brainstem. There is 12 mm of T-max > 10 S in the posterior left MCA territory. Hypoperfusion index 0.2. Mismatch Volume: Unclear. Infarction Location:Posterior left MCA territory abnormal CBF, abnormal CBV, and T-max > 10 S in an area of up to 12 mL. CTA NECK Skeleton: Scattered carious dentition. Age advanced lower cervical spine degeneration with bulky endplate osteophytosis. No acute osseous abnormality identified. Upper chest: Mild upper lung atelectasis. There is a left coronary artery stent in place. Small volume of dependent retained secretions in the trachea approaching the carina. But otherwise the visible major airways are patent. Other neck: No acute finding. Aortic arch: 3 vessel arch configuration.  No arch atherosclerosis. Right carotid system: Negative. Left carotid system: Negative. Vertebral arteries: Suboptimal but  adequate cervical vertebral artery contrast timing. Proximal right subclavian and cervical right vertebral artery appear patent and negative. Proximal left subclavian and codominant cervical left vertebral artery appear patent and negative. CTA HEAD Posterior circulation: Fairly codominant distal vertebral arteries appear patent and within normal limits to the basilar. Right PICA origin appears patent. Left AICA might be  dominant, uncertain. Patent basilar artery without stenosis. SCA and PCA origins appear normal. Left posterior communicating artery is present, right is diminutive or absent. Bilateral PCA branches are within normal limits. Anterior circulation: Both ICA siphons are patent, with no siphon plaque or stenosis. Normal left posterior communicating artery. Patent carotid termini, MCA and ACA origins. Codominant A1 segments. Diminutive anterior communicating arteries suspected. Suboptimal intracranial contrast bolus. Allowing for this the ACA branches are within normal limits. Likewise, the right M1 segment, bifurcation, and right MCA branches are within normal limits. And the left MCA M1 segment also appears symmetric, patent to the trifurcation, and within normal limits. No left MCA branch occlusion is identified. Venous sinuses: Early contrast timing, grossly patent. Anatomic variants: None significant. Review of the MIP images confirms the above findings Preliminary report of the above discussed by telephone Dr. Loleta Rose on 10/04/2022 at 0518 hours. IMPRESSION: 1. Suboptimal although adequate contrast bolus. Strong evidence on CTP of a small posterior Left MCA territory infarct core (approximately 12 mL). CTP also suggests approximately 60 mL of oligemia in the Left MCA territory. However, there is NO large vessel occlusion, and no discrete MCA branch occlusion detected on CTA. 2. No atherosclerosis, stenosis, or arterial abnormality identified in the head or neck. There is a left coronary artery  stent visible in the upper chest. 3. Mild upper lung atelectasis and small volume retained secretions in the trachea. Salient findings discussed by telephone with Dr. Loleta Rose on 10/04/2022 at 0518 hours. Electronically Signed   By: Odessa Fleming M.D.   On: 10/04/2022 05:31   CT HEAD CODE STROKE WO CONTRAST  Addendum Date: 10/04/2022   ADDENDUM REPORT: 10/04/2022 04:51 ADDENDUM: Study discussed by telephone with Dr. Loleta Rose on 10/04/2022 at 0445 hours. Clinical picture is highly suspicious for Left MCA ELVO. Patient was undergoing initial tele neurology evaluation as we spoke. Electronically Signed   By: Odessa Fleming M.D.   On: 10/04/2022 04:51   Result Date: 10/04/2022 CLINICAL DATA:  Code stroke. 46 year old male with right side weakness and aphasia. EXAM: CT HEAD WITHOUT CONTRAST TECHNIQUE: Contiguous axial images were obtained from the base of the skull through the vertex without intravenous contrast. RADIATION DOSE REDUCTION: This exam was performed according to the departmental dose-optimization program which includes automated exposure control, adjustment of the mA and/or kV according to patient size and/or use of iterative reconstruction technique. COMPARISON:  Head CT 05/20/2009. FINDINGS: Brain: No acute intracranial hemorrhage identified. No midline shift, mass effect, or evidence of intracranial mass lesion. No ventriculomegaly. Normal cerebral volume. No convincing cytotoxic edema. Mild asymmetric enlargement of the left lateral ventricle redemonstrated and normal variant. Vascular: Subtle if any asymmetric hyperdensity of left MCA branches in the sylvian fissure. Skull: No acute osseous abnormality identified. Sinuses/Orbits: Visualized paranasal sinuses and mastoids are stable and well aerated. Other: Leftward gaze. Visualized scalp soft tissues are within normal limits. ASPECTS [facility] Stroke Program Early CT Score) Total score (0-10 with 10 being normal): 10 IMPRESSION: Leftward gaze, but no  acute cortically based infarct or intracranial hemorrhage identified. And subtle if any left MCA vessel asymmetry. ASPECTS 10. Electronically Signed: By: Odessa Fleming M.D. On: 10/04/2022 04:40    Labs:  Basic Metabolic Panel: Recent Labs  Lab 10/20/22 0707  NA 137  K 4.3  CL 106  CO2 22  GLUCOSE 104*  BUN 15  CREATININE 0.97  CALCIUM 9.3    CBC: Recent Labs  Lab 10/20/22 0707  WBC 6.7  HGB 17.0  HCT 50.3  MCV 84.3  PLT 266    CBG: No results for input(s): "GLUCAP" in the last 168 hours.  Family history.  Father with heart disease.  Denies any colon cancer esophageal cancer or rectal cancer  Brief HPI:   Ricardo Yang is a 46 y.o. right-handed male with history of reported CVA in the past without deficits, CAD with anterior STEMI 07/2019, non-STEMI 03/2021 status post PCI with complications of ischemic cardiomyopathy ejection fraction 25 to 30% from echocardiogram 6/23 maintained on aspirin and Plavix followed by cardiology services, hypertension, tobacco and polysubstance abuse as well as obesity with BMI 30.70.  Per chart review lives with significant other.  Reportedly independent prior to admission.  Presented 10/04/2022 with acute onset of aphasia and right-sided weakness with leftward gaze.  Cranial CT scan with no acute changes.  CT angiogram head and neck no large vessel occlusion.  No atherosclerosis, stenosis or arterial abnormality identified.  MRI showed acute infarct in the left ACA, left MCA and likely of the right PCA territory with a large infarct involving the left postcentral gyrus.  Patient did not receive tPA.  Admission chemistries unremarkable except WBC 11,300 alcohol negative, urine drug screen positive cocaine as well as marijuana, troponin 265-275.  Echocardiogram with ejection fraction less than 20%.  TEE completed by cardiology service showing severely reduced systolic function and normal mural apical thrombus with estimated ejection fraction of 20 to 25%.  Bubble  study negative.  No PFO noted.  Elevated troponin felt to be related to demand ischemia.  Patient remained on aspirin and Plavix as prior to admission.  Tolerating a regular diet.  Therapy evaluations completed due to patient's decreased functional ability and aphasia was admitted for a comprehensive rehab program.   Hospital Course: Ricardo L Sparr was admitted to rehab 10/09/2022 for inpatient therapies to consist of PT, ST and OT at least three hours five days a week. Past admission physiatrist, therapy team and rehab RN have worked together to provide customized collaborative inpatient rehab.  Pertaining to patient's left MCA infarction remained stable he continued on aspirin and Plavix therapy and would follow-up neurology services.  History of CAD with PCI chronic systolic congestive heart failure with ejection fraction of 20 to 25%.  He remained on Farxiga.  He exhibited no signs of fluid overload.  He would follow-up with cardiology services.  TEE without thrombus.  Blood pressure controlled on lisinopril as well as Toprol will need outpatient follow-up.  Hyperlipidemia Lipitor as advised.  Obesity BMI 30.7 no dietary follow-up.  Bouts of constipation resolved with laxative assistance.  Chronic bilateral knee pain Voltaren gel as needed.   Blood pressures were monitored on TID basis and controlled    Rehab course: During patient's stay in rehab weekly team conferences were held to monitor patient's progress, set goals and discuss barriers to discharge. At admission, patient required minimal assist 40 feet hemiwalker moderate assist squat pivot transfers  Physical exam.  Blood pressure 115/77 pulse 76 temperature 98.1 respiration 17 oxygen saturations 98% room air Constitutional.  No acute distress HEENT Head.  Normocephalic and atraumatic Eyes.  Pupils round and reactive to light no discharge without nystagmus Neck.  Supple nontender no JVD without thyromegaly Cardiac regular rate and rhythm  without any extra sounds or murmur heard Abdomen.  Soft nontender positive bowel sounds without rebound Respiratory effort normal no respiratory distress without wheeze Skin.  Warm and dry Neurologic.  Alert oriented.  CN II through XII intact.  Mild aphasia.  Fair judgment and insight Strength 5/5 in all left upper left lower extremity Strength 2-3/5 in right upper extremity shoulder abduction, elbow extension flexion finger flexion Strength 4/5 proximal 5/5 distal right lower extremity Sensation intact.  He/She  has had improvement in activity tolerance, balance, postural control as well as ability to compensate for deficits. He/She has had improvement in functional use RUE/LUE  and RLE/LLE as well as improvement in awareness.  Sessions focused on independence and functional mobility.  Ambulates to the therapy gym and navigate stairs with supervision.  Ambulates up to 1000 feet around hospital grounds including uneven surfaces and stairs.  Gather his belongings for activities day living and homemaking.  He was able to communicate his needs having some word finding difficulties.  Full family teaching completed plan discharge to home       Disposition: Discharge to home    Diet: Regular  Special Instructions: No driving smoking or alcohol  Medications at discharge 1.  Tylenol as needed 2.  Aspirin 81 mg p.o. daily 3.  Lipitor 80 mg p.o. daily 4.  Plavix 75 mg p.o. daily 5.  Farxiga 10 mg daily 6.  Voltaren gel 2 g 4 times daily as needed to affected area 7.  Lisinopril 2.5 mg daily 8.  Melatonin 3 mg p.o. nightly 9.  Toprol-XL 12.5 mg p.o. daily 10.  MiraLAX daily hold for loose stools  30-35 minutes were spent completing discharge summary and discharge planning  Discharge Instructions     Ambulatory referral to Neurology   Complete by: As directed    An appointment is requested in approximately: 4 weeks call for appointment   Ambulatory referral to Physical Medicine Rehab    Complete by: As directed    Moderate complexity follow-up 1 to 2 weeks left MCA infarction        Follow-up Information     Fanny Dance, MD Follow up.   Specialty: Physical Medicine and Rehabilitation Why: Office to call for appointment Contact information: [address] [place] [address] [phone]         Marcina Millard, MD Follow up.   Specialty: Cardiology Why: Call for appointment Contact information: [address] [facility] [place] [address] [phone]         [facility] Call.   Specialty: Primary Care Why: NEEDS TO CALL TO MAKE AN APPOINTMENT FOR FOLLOW UP AND ASSIST WITH MEDICATIONS Contact information: [address] [place] [address] [phone]                Signed: Charlton Amor 10/23/2022, 5:45 AM

## 2022-10-22 ENCOUNTER — Other Ambulatory Visit (HOSPITAL_COMMUNITY): Payer: Self-pay

## 2022-10-22 MED ORDER — DAPAGLIFLOZIN PROPANEDIOL 10 MG PO TABS
10.0000 mg | ORAL_TABLET | Freq: Every day | ORAL | 0 refills | Status: DC
  Filled 2022-10-22: qty 30, 30d supply, fill #0

## 2022-10-22 MED ORDER — CLOPIDOGREL BISULFATE 75 MG PO TABS
75.0000 mg | ORAL_TABLET | Freq: Every day | ORAL | 0 refills | Status: DC
  Filled 2022-10-22: qty 30, 30d supply, fill #0

## 2022-10-22 MED ORDER — DICLOFENAC SODIUM 1 % EX GEL
2.0000 g | Freq: Four times a day (QID) | CUTANEOUS | 0 refills | Status: DC | PRN
  Filled 2022-10-22: qty 100, fill #0

## 2022-10-22 MED ORDER — MELATONIN 3 MG PO TABS
3.0000 mg | ORAL_TABLET | Freq: Every day | ORAL | 0 refills | Status: DC
  Filled 2022-10-22: qty 30, 30d supply, fill #0

## 2022-10-22 MED ORDER — ATORVASTATIN CALCIUM 80 MG PO TABS
80.0000 mg | ORAL_TABLET | Freq: Every day | ORAL | 0 refills | Status: DC
  Filled 2022-10-22: qty 30, 30d supply, fill #0

## 2022-10-22 MED ORDER — ACETAMINOPHEN 325 MG PO TABS: 650.0000 mg | ORAL_TABLET | ORAL | Status: DC | PRN

## 2022-10-22 MED ORDER — METOPROLOL SUCCINATE ER 25 MG PO TB24
12.5000 mg | ORAL_TABLET | Freq: Every day | ORAL | 0 refills | Status: DC
  Filled 2022-10-22: qty 30, 60d supply, fill #0

## 2022-10-22 MED ORDER — LISINOPRIL 5 MG PO TABS
2.5000 mg | ORAL_TABLET | Freq: Every day | ORAL | 0 refills | Status: DC
  Filled 2022-10-22: qty 15, 30d supply, fill #0

## 2022-10-22 NOTE — Progress Notes (Signed)
PROGRESS NOTE   Subjective/Complaints: Working with therapy in gym. No new concerns this AM.    Review of Systems  Constitutional:  Negative for fever and malaise/fatigue.  HENT:  Negative for congestion.   Eyes:  Negative for double vision.  Respiratory:  Negative for shortness of breath.   Cardiovascular:  Negative for chest pain.  Gastrointestinal:  Negative for abdominal pain, nausea and vomiting.  Genitourinary: Negative.  Negative for dysuria.  Musculoskeletal:  Positive for joint pain (b/l knees, chronic, improving).  Neurological:  Positive for sensory change and focal weakness (R arm). Negative for dizziness and weakness.  Psychiatric/Behavioral:  Negative for depression. The patient has insomnia.     Objective:   No results found. Recent Labs    10/20/22 0707  WBC 6.7  HGB 17.0  HCT 50.3  PLT 266    Recent Labs    10/20/22 0707  NA 137  K 4.3  CL 106  CO2 22  GLUCOSE 104*  BUN 15  CREATININE 0.97  CALCIUM 9.3     Intake/Output Summary (Last 24 hours) at 10/22/2022 5366 Last data filed at 10/22/2022 0715 Gross per 24 hour  Intake 660 ml  Output 600 ml  Net 60 ml         Physical Exam: Vital Signs Blood pressure 114/65, pulse 60, temperature 97.8 F (36.6 C), temperature source Oral, resp. rate 18, height 6\' 3"  (1.905 m), weight 105.7 kg, SpO2 99 %.   General: NAD,  comfortable appearing HEENT: Head is normocephalic, atraumatic, conjugate gaze, oral mucosa pink and moist,  Neck: Supple without JVD or lymphadenopathy Heart: Reg rate and rhythm. No murmurs rubs or gallops, holter monitor in place Chest: CTA bilaterally without wheezes, rales, or rhonchi; good air movement Abdomen: Soft, non-tender, non-distended, bowel sounds positive. Extremities: No clubbing, cyanosis, or edema. Pulses are 2+ Psych: Pt's affect is appropriate. Pt is cooperative. pleasant Skin: warm and dry, no  breakdown noted Neuro:  alert and oriented x4, follows commands, CN 2-12 grossly intact, Mild aphasia-improving. Fair judgement and insight.  Musculoskeletal: NO joint swelling or tenderness noted, moving all 4 extremities Walking without walker  Prior exam  Strength 5/5 in all LUE and LLE Strength 2-3/5 in RUE shoulder abduction, elbow extension and flexion, finger flexion Strength 4/5 proximal  5/5 distal RLE Sensation intact in all 4 extremities but decreased in RUE  Assessment/Plan: 1. Functional deficits which require 3+ hours per day of interdisciplinary therapy in a comprehensive inpatient rehab setting. Physiatrist is providing close team supervision and 24 hour management of active medical problems listed below. Physiatrist and rehab team continue to assess barriers to discharge/monitor patient progress toward functional and medical goals  Care Tool:  Bathing    Body parts bathed by patient: Right arm, Chest, Abdomen, Front perineal area, Buttocks, Right upper leg, Left upper leg, Right lower leg, Left lower leg, Face, Left arm   Body parts bathed by helper: Left arm     Bathing assist Assist Level: Supervision/Verbal cueing     Upper Body Dressing/Undressing Upper body dressing   What is the patient wearing?: Pull over shirt    Upper body assist Assist Level: Supervision/Verbal cueing  Lower Body Dressing/Undressing Lower body dressing      What is the patient wearing?: Pants     Lower body assist Assist for lower body dressing: Supervision/Verbal cueing     Toileting Toileting    Toileting assist Assist for toileting: Supervision/Verbal cueing     Transfers Chair/bed transfer  Transfers assist     Chair/bed transfer assist level: Supervision/Verbal cueing     Locomotion Ambulation   Ambulation assist      Assist level: Contact Guard/Touching assist Assistive device: No Device Max distance: 300+   Walk 10 feet activity   Assist      Assist level: Contact Guard/Touching assist Assistive device: No Device   Walk 50 feet activity   Assist    Assist level: Contact Guard/Touching assist Assistive device: No Device    Walk 150 feet activity   Assist    Assist level: Contact Guard/Touching assist Assistive device: No Device    Walk 10 feet on uneven surface  activity   Assist     Assist level: Contact Guard/Touching assist Assistive device: Other (comment) (no device)   Wheelchair     Assist Is the patient using a wheelchair?: Yes Type of Wheelchair: Manual    Wheelchair assist level: Supervision/Verbal cueing Max wheelchair distance: >200'    Wheelchair 50 feet with 2 turns activity    Assist        Assist Level: Supervision/Verbal cueing   Wheelchair 150 feet activity     Assist      Assist Level: Supervision/Verbal cueing   Blood pressure 114/65, pulse 60, temperature 97.8 F (36.6 C), temperature source Oral, resp. rate 18, height 6\' 3"  (1.905 m), weight 105.7 kg, SpO2 99 %.  Medical Problem List and Plan: 1. 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  Antithrombotics: -DVT/anticoagulation:  Mechanical: Antiembolism stockings, thigh (TED hose) Bilateral lower extremities             -antiplatelet therapy: Aspirin 81 mg daily and Plavix 75 mg daily 3. Pain Management: Tylenol as needed 4. Mood/Behavior/Sleep: Provide emotional support             -antipsychotic agents: N/A  -see #16 5. Neuropsych/cognition: This patient is not capable of making decisions on his own behalf. 6. Skin/Wound Care: Routine skin checks 7. Fluids/Electrolytes/Nutrition: Routine in and outs with follow-up chemistries 8.  CAD/history of  PCI/chronic systolic congestive heart failure with ejection fraction 20 to 25%.   -Daily weights.  -TEE without thrombus.  Monitor for any signs of fluid overload.   -Continue Farxiga 10 mg daily.  Continue asa and plavix.    -Follow-up outpatient cardiology services -continue Holter monitor, called company as holter monitor blinking red, called company- noted to be bad contact with skin, contact improved and it turned green, continue to monitor -Wt trending up a little, continue to monitor Filed Weights   10/20/22 0500 10/21/22 0500 10/22/22 0503  Weight: 104.3 kg 104.2 kg 105.7 kg    9.  Hypertension.  Lisinopril 2.5 mg daily, Toprol-XL 12.5 mg daily.   Monitor with increased mobility  -12/27 well controlled  10/22/2022    5:03 AM 10/22/2022    4:11 AM 10/21/2022    7:56 PM  Vitals with BMI  Weight 233 lbs    BMI 29.13    Systolic  114 110  Diastolic  65 64  Pulse  60 69   10.  Hyperlipidemia.  Lipitor 80mg  daily. Heart healthy diet 11.  History of tobacco as well as polysubstance use.  Urine drug screen positive marijuana as well as cocaine.  Provide counseling 12.  Obesity.  BMI 30.70.  Dietary follow-up 13. Leukocytosis: monitor for s/sx of infection  -WNL 12/18  -Monitor on weekly labs 10/20/22 14. Constipation: Miralax QD ordered  -Sorbitol 70% 3mL QD PRN ordered and helped significantly  -12/22 LBM yesterday-improved, continue current regimen -12/23 no BM since 12/21 but denies complaints, knows he has meds ordered if needed, continue current regimen -10/19/22 no BM today so took sorbitol, awaiting response; continue to monitor, encourage PO hydration -12/25 LBM yesterday-improved 15. Chronic b/l knee pain: reports some increased pain in popliteal fossas with increased mobility, no acute complaints/concerns, usually takes BC powder at home  -Tylenol PRN ordered  -Ordered Voltaren 1% gel 2g QID PRN-- not used, knows to ask if needed 16. Insomnia: difficulty staying  asleep  -Melatonin 3mg  QHS ordered  -12/22 Ordered Trazodone 25mg  QHS PRN-- not used, knows to ask      LOS: 13 days A FACE TO FACE EVALUATION WAS PERFORMED  10/22/2022, 8:28 AM

## 2022-10-22 NOTE — Patient Care Conference (Signed)
Inpatient RehabilitationTeam Conference and Plan of Care Update Date: 10/22/2022   Time: 12:18 PM    Patient Name: Ricardo Yang      Medical Record Number: [id number]  Date of Birth: 1976/06/19 Sex: Male         Room/Bed: 4M03C/4M03C-01 Payor Info: Payor: /    Admit Date/Time:  10/09/2022  4:21 PM  Primary Diagnosis:  Left middle cerebral artery stroke [facility])  Hospital Problems: Principal Problem:   Left middle cerebral artery stroke ([place]) Active Problems:   History of substance abuse ([place])   Insomnia   Slow transit constipation   Chronic pain of both knees    Expected Discharge Date: Expected Discharge Date: 10/23/22  Team Members Present: Physician leading conference: Dr. Jennye Boroughs Social Worker Present: Ovidio Kin, LCSW Nurse Present: Dorien Chihuahua, RN PT Present: Terence Lux, PT OT Present: Willeen Cass, OT PPS Coordinator present : Gunnar Fusi, SLP     Current Status/Progress Goal Weekly Team Focus  Bowel/Bladder      Continent B+B          Swallow/Nutrition/ Hydration               ADL's   At goal level- supervision for ADLs and transfers. RUE has full AROM now, still limited with poor extension in wrist and hand as well as flexor synergy patterns but great improvement.   supervision overall   RUE NMR, dynamic balance, d/c planning ,ADLs    Mobility   Supv with all functional mobility without the use of an AD   Mod I/ Supervision  R NMR, gait, coordination/balance, high level gait training, discharge planning    Communication   mod I   Mod I goal met d/c 12/26        Safety/Cognition/ Behavioral Observations               Pain     N/A           Skin      N/A           Discharge Planning:  Going home with fiance and mom will assist when fiance at work. Pt will need to go to [facility] for PT follow can not get charity home health. Match in for medication assistance   Team Discussion: Patient is doing well  overall.  Patient on target to meet rehab goals: yes, currently has full ROM in right UE. Mod I - supervision and has met his goals.  *See Care Plan and progress notes for long and short-term goals.   Revisions to Treatment Plan:  Cardiac monitoring completed; patch/recorder removed and returned to the company.  Teaching Needs: Safety, medications, dietary modifications, etc.   Current Barriers to Discharge: Decreased caregiver support and lack of insurance for follow up services  Possible Resolutions to Barriers: OP follow up services     Medical Summary Current Status: MCA territory inferction,CAD, CHF, consitpation, insomnia, HTN  Barriers to Discharge: Medical stability;Self-care education  Barriers to Discharge Comments: MCA territory inferction,CAD, CHF, consitpation, insomnia, HTN Possible Resolutions to [employer] Focus: monitor bowel function, monitor BP, monitor weight   Continued Need for Acute Rehabilitation Level of Care: The patient requires daily medical management by a physician with specialized training in physical medicine and rehabilitation for the following reasons: Direction of a multidisciplinary physical rehabilitation program to maximize functional independence : Yes Medical management of patient stability for increased activity during participation in an intensive rehabilitation regime.: Yes Analysis of laboratory values  and/or radiology reports with any subsequent need for medication adjustment and/or medical intervention. : Yes   I attest that I was present, lead the team conference, and concur with the assessment and plan of the team.   Dorien Chihuahua B 10/22/2022, 1:45 PM

## 2022-10-22 NOTE — Progress Notes (Signed)
Physical Therapy Discharge Summary  Patient Details  Name: Ricardo Yang MRN: [id number] Date of Birth: Jul 14, 1976  Date of Discharge from PT service:October 22, 2022  Today's Date: 10/22/2022 PT Individual Time: 1st Treatment Session: 0915-1030; 2nd Treatment Session: 1415-1530 PT Individual Time Calculation (min): 75 min; 75 min    Patient has met 9 of 9 long term goals due to improved activity tolerance, improved balance, improved postural control, increased strength, ability to compensate for deficits, functional use of  right lower extremity, improved attention, improved awareness, and improved coordination.  Patient to discharge at an ambulatory level Supervision. Patient's care partner is independent to provide the necessary  supervision  assistance at discharge.  Reasons goals not met: N/A  Recommendation:  Patient will benefit from ongoing skilled PT services in outpatient setting to continue to advance safe functional mobility, address ongoing impairments in dynamic stability, R NMR, global strengthening, endurance/activity tolerance, and minimize fall risk.  Equipment: No equipment provided  Reasons for discharge: treatment goals met and discharge from hospital  Patient/family agrees with progress made and goals achieved: Yes  PT Discharge Precautions/Restrictions Precautions Precautions: Fall Pain Interference Pain Interference Pain Effect on Sleep: 1. Rarely or not at all Pain Interference with Therapy Activities: 1. Rarely or not at all Pain Interference with Day-to-Day Activities: 1. Rarely or not at all Vision/Perception  Vision - History Ability to See in Adequate Light: 0 Adequate Perception Perception: Impaired Inattention/Neglect: Does not attend to right side of body (Improved since initial evaluation) Praxis Praxis: Intact  Cognition Overall Cognitive Status: Within Functional Limits for tasks assessed Arousal/Alertness: Awake/alert Orientation  Level: Oriented X4 Year: 2023 Month: December Day of Week: Correct Focused Attention: Appears intact Sustained Attention: Appears intact Selective Attention: Appears intact Memory: Appears intact Awareness: Appears intact Problem Solving: Appears intact Safety/Judgment: Appears intact Sensation Sensation Light Touch: Impaired Detail Central sensation comments: Light touch impaired in the RUE- difficulty localizing on forearm Light Touch Impaired Details: Impaired RUE;Impaired RLE Coordination Gross Motor Movements are Fluid and Coordinated: No Fine Motor Movements are Fluid and Coordinated: No Coordination and Movement Description: Greatly improved, still with RUE hemi that impacts coordination Motor  Motor Motor: Hemiplegia Motor - Discharge Observations: R hemi, greatly improved  Mobility Bed Mobility Bed Mobility: Rolling Right;Rolling Left;Supine to Sit;Sit to Supine Rolling Right: Independent Rolling Left: Independent Supine to Sit: Independent Sit to Supine: Independent Transfers Transfers: Sit to Stand;Stand to Sit;Stand Pivot Transfers Sit to Stand: Independent Stand to Sit: Independent Stand Pivot Transfers: Independent Transfer (Assistive device): None Locomotion  Gait Ambulation: Yes Gait Assistance: Supervision/Verbal cueing Gait Distance (Feet): 200 Feet Assistive device: None Gait Gait: Yes Stairs / Additional Locomotion Stairs: Yes Stairs Assistance: Supervision/Verbal cueing Stair Management Technique: One rail Left Number of Stairs: 12 Height of Stairs: 6 Ramp: Supervision/Verbal cueing Curb: Supervision/Verbal cueing Pick up small object from the floor assist level: Independent Pick up small object from the floor assistive device: No AD Wheelchair Mobility Wheelchair Mobility: Yes Wheelchair Assistance: Independent with assistive device Wheelchair Propulsion: Both lower extermities;Left upper extremity Wheelchair Parts Management: Needs  assistance Distance: >42'  Trunk/Postural Assessment  Cervical Assessment Cervical Assessment: Within Functional Limits Thoracic Assessment Thoracic Assessment: Within Functional Limits Lumbar Assessment Lumbar Assessment: Within Functional Limits Postural Control Postural Control: Deficits on evaluation Righting Reactions: Righting reactions delayed  Balance Balance Balance Assessed: Yes Standardized Balance Assessment Standardized Balance Assessment: Berg Balance Test Berg Balance Test Sit to Stand: Able to stand without using hands and stabilize independently Standing Unsupported:  Able to stand safely 2 minutes Sitting with Back Unsupported but Feet Supported on Floor or Stool: Able to sit safely and securely 2 minutes Stand to Sit: Sits safely with minimal use of hands Transfers: Able to transfer safely, minor use of hands Standing Unsupported with Eyes Closed: Able to stand 10 seconds safely Standing Ubsupported with Feet Together: Able to place feet together independently and stand 1 minute safely From Standing, Reach Forward with Outstretched Arm: Can reach confidently >25 cm (10") From Standing Position, Pick up Object from Floor: Able to pick up shoe safely and easily From Standing Position, Turn to Look Behind Over each Shoulder: Looks behind from both sides and weight shifts well Turn 360 Degrees: Able to turn 360 degrees safely in 4 seconds or less Standing Unsupported, Alternately Place Feet on Step/Stool: Able to stand independently and safely and complete 8 steps in 20 seconds Standing Unsupported, One Foot in Front: Able to plae foot ahead of the other independently and hold 30 seconds Standing on One Leg: Able to lift leg independently and hold 5-10 seconds Total Score: 54 Static Sitting Balance Static Sitting - Balance Support: Feet supported Static Sitting - Level of Assistance: 6: Modified independent (Device/Increase time) Dynamic Sitting Balance Dynamic  Sitting - Balance Support: Feet supported Dynamic Sitting - Level of Assistance: 6: Modified independent (Device/Increase time) Static Standing Balance Static Standing - Balance Support: During functional activity;No upper extremity supported Static Standing - Level of Assistance: 6: Modified independent (Device/Increase time) Dynamic Standing Balance Dynamic Standing - Balance Support: During functional activity;No upper extremity supported Dynamic Standing - Level of Assistance: 5: Stand by assistance Extremity Assessment      RLE Assessment RLE Assessment: Within Functional Limits General Strength Comments: Gross strength 4/5 LLE Assessment LLE Assessment: Within Functional Limits General Strength Comments: Grossly 5/5 for all functional mobility  Skilled Intervention 1st Treatment Session- Patient greeted sitting upright in bedside recliner in room and agreeable to PT treatment session. Patient performed sit/stand without the use of an AD ModI- Patient then ambulated to the rehab gym and performed a car transfers, ambulated up/down a low grade ramp and across mulch without the use of an AD and Supv. Patient then ambulated from ortho gym to main gym without AD and [place]. Patient ascended/descended x12 steps with L HR and supv for safety- Patient demonstrated a reciprocal pattern without any LOB noted. Patient was able to pick up an item from the floor with L UE ModI. Patient was then able to complete all bed mobility independently without the use of a bed rail. Patient then completed the [place] (please see below for details regarding scoring). Patient demonstrates increased fall risk as noted by score of 52/56 on Berg Balance Scale.  (<36= high risk for falls, close to 100%; 37-45 significant >80%; 46-51 moderate >50%; 52-55 lower >25%). Patient then completed the [place] x10 minutes on level 8 with R hand strapped to the handle via coban. Patient then ambulated back to his room  without AD ModI. Patient left sitting upright in bedside recliner with call bell within reach, posey belt on and all needs met.   2nd Treatment Session- Patient greeted sitting upright in bedside recliner in room and agreeable to PT treatment session. Treatment session focused on community re-integration in order to improve overall safety and independence upon discharge home. Patient ambulated throughout the 4th floor, on/off elevators, outside on various surfaces and throughout the 1st floor of the hospital without the use of an AD  and Supv. Patient caught his left toe on the base of chair in the atrium causing a moderate LOB, however patient was able to regain his balance by appropriately utilizing his stepping strategy. Therapist and patient discussed discharge planning, education, lifestyle modification, safety, etc. Therapist provided patient with HEP (please see details below), supervision handout for family and information on [facility] physical [facility] program [facility] for follow-up services. Therapist and patient went through all exercises on HEP and discussed ways to make each exercise more challenging. Patient returned to his room sitting upright in bedside recliner with call bell within reach, posey belt on and all needs met.   Access Code: [email or web address] URL: [email or web address] Date: 10/22/2022 Prepared by: Jodi Mourning Jenah Vanasten  Exercises - Sit to Stand Without Arm Support  - Standing Tandem Balance with Counter Support  - Heel Raises with Counter Support - Standing March with Counter Support  - Standing Hip Abduction with Counter Support - Standing Hip Extension with Counter Support - Mini Squat with Counter Support  - Standing with Head Rotation  - Standing with Head Nod  - [place] Stance with Head Nods - Half Tandem Stance Balance with Head Nods   Jamair Cato 10/22/2022, 10:14 AM

## 2022-10-22 NOTE — Progress Notes (Signed)
Occupational Therapy Session Note  Patient Details  Name: Ricardo Yang MRN: [id number] Date of Birth: November 16, 1975  Today's Date: 10/22/2022 OT Individual Time: 4034-7425 OT Individual Time Calculation (min): 58 min    Short Term Goals: Week 2:  OT Short Term Goal 1 (Week 2): STG= LTG d/t ELOS  Skilled Therapeutic Interventions/Progress Updates:    Patient agreeable to participate in OT session. Reports 0/10 pain level.   Patient participated in skilled OT session focusing on ADL re-training, functional mobility/transfers, and HEP education. Therapist facilitated session with pt completing bathing and dressing with SBA in order to promote increased independence with self care tasks. Pt showered while seated on tub bench. Functional mobility completed in bathroom, bedroom, and in hall with SBA and no AD. Pt education completed for UE HEP. Provided verbal instruction, visual demonstration, and handout for reference. Pt verbalized and demonstrated understanding.   Exercises completed: 2x10 Supine chest press, AA/ROM Standing shoulder abduction, AA/ROM Scapular retraction, A/ROM Table slide shoulder flexion standing    Therapy Documentation Precautions:  Precautions Precautions: Fall Type of Shoulder Precautions: R hemi UE>LE Restrictions Weight Bearing Restrictions: No RUE Weight Bearing: Non weight bearing   Therapy/Group: Individual Therapy  Limmie Patricia, OTR/L,CBIS  Supplemental OT - [facility] and [facility]  10/22/2022, 7:59 AM

## 2022-10-22 NOTE — Progress Notes (Signed)
Inpatient Rehabilitation Care Coordinator Discharge Note   Patient Details  Name: [place] L Vidas MRN: [id number] Date of Birth: 03/16/1976   Discharge location: HOME WITH FIANCE WHO CAN PROVIDE ASSIST ALONG WITH HIS MOM  Length of Stay: 14 DAYS  Discharge activity level: SUPERVISION-MOD/I LEVEL  Home/community participation: ACTIVE  Patient response ON:GEXBMW Literacy - How often do you need to have someone help you when you read instructions, pamphlets, or other written material from your doctor or pharmacy?: Never  Patient response UX:LKGMWN Isolation - How often do you feel lonely or isolated from those around you?: Never  Services provided included: MD, RD, PT, OT, SLP, RN, CM, Pharmacy, Neuropsych, TR, SW  Financial Services:  Field seismologist Utilized: Other (Comment) (UNINSURED)    Choices offered to/list presented to: PT  Follow-up services arranged:  Outpatient    Outpatient Servicies: [facility]      Patient response to transportation need: Is the patient able to respond to transportation needs?: Yes In the past 12 months, has lack of transportation kept you from medical appointments or from getting medications?: No In the past 12 months, has lack of transportation kept you from meetings, work, or from getting things needed for daily living?: No    Comments (or additional information):PT DID WELL AND EXCEEDED HIS GOALS FEELS READY FOR DISCHARGE HOME. PLANS TO CHANGE HIS WAYS AND QUIT HIS SUBSTANCES DECLINED RESOURCES FOR THIS  Patient/Family verbalized understanding of follow-up arrangements:  Yes  Individual responsible for coordination of the follow-up plan: SELF [phone]  Confirmed correct DME delivered: Lucy Chris 10/22/2022    Sherald Balbuena, Lemar Livings

## 2022-10-22 NOTE — Progress Notes (Signed)
Patient ID: Ricardo Yang, male   DOB: 1975/12/26, 46 y.o.   MRN: [id number]  Met with pt to give him team conference update he feels prepared to go home tomorrow and between fiance and Mom someone will be with him. He has no equipment needs and aware could not get charity home health due to drug history. He will pursue [facility] in [place] for PT follow up. Pt feels ready to go home tomorrow

## 2022-10-23 NOTE — Progress Notes (Signed)
Inpatient Rehabilitation Discharge Medication Review by a Pharmacist  A complete drug regimen review was completed for this patient to identify any potential clinically significant medication issues.  High Risk Drug Classes Is patient taking? Indication by Medication  Antipsychotic No   Anticoagulant No   Antibiotic No   Opioid No   Antiplatelet Yes Clopidogrel, Aspirin - CVA  Hypoglycemics/insulin Yes Farxiga - CHF  Vasoactive Medication Yes Metoprolol XL, lisinopril - CHF  Chemotherapy No   Other Yes Atorvastatin - HLD     Type of Medication Issue Identified Description of Issue Recommendation(s)  Drug Interaction(s) (clinically significant)     Duplicate Therapy     Allergy     No Medication Administration End Date     Incorrect Dose     Additional Drug Therapy Needed     Significant med changes from prior encounter (inform family/care partners about these prior to discharge).    Other       Clinically significant medication issues were identified that warrant physician communication and completion of prescribed/recommended actions by midnight of the next day:  No  Pharmacist comments: None  Time spent performing this drug regimen review (minutes):  15 minutes  Noah Delaine, RPh Clinical Pharmacist

## 2022-10-23 NOTE — Progress Notes (Signed)
PROGRESS NOTE   Subjective/Complaints: Looking forward to going home. No new concerns.    Review of Systems  Constitutional:  Negative for fever and malaise/fatigue.  HENT:  Negative for congestion.   Eyes:  Negative for double vision.  Respiratory:  Negative for cough and shortness of breath.   Cardiovascular:  Negative for chest pain.  Gastrointestinal:  Negative for abdominal pain, nausea and vomiting.  Genitourinary: Negative.  Negative for dysuria.  Musculoskeletal:  Positive for joint pain (b/l knees, chronic, improving).  Skin:  Negative for rash.  Neurological:  Positive for sensory change and focal weakness (R arm). Negative for dizziness and weakness.  Psychiatric/Behavioral:  Negative for depression. The patient has insomnia.     Objective:   No results found. No results for input(s): "WBC", "HGB", "HCT", "PLT" in the last 72 hours.  No results for input(s): "NA", "K", "CL", "CO2", "GLUCOSE", "BUN", "CREATININE", "CALCIUM" in the last 72 hours.   Intake/Output Summary (Last 24 hours) at 10/23/2022 1036 Last data filed at 10/23/2022 0805 Gross per 24 hour  Intake 840 ml  Output 800 ml  Net 40 ml         Physical Exam: Vital Signs Blood pressure 104/69, pulse 70, temperature 98.1 F (36.7 C), temperature source Oral, resp. rate 16, height 6\' 3"  (1.905 m), weight 105.7 kg, SpO2 94 %.   General: NAD,  comfortable appearing HEENT: Head is normocephalic, atraumatic, conjugate gaze, oral mucosa pink and moist,  Neck: Supple without JVD or lymphadenopathy Heart: Reg rate and rhythm. No murmurs rubs or gallops, Chest: CTA bilaterally without wheezes, rales, or rhonchi; good air movement Abdomen: Soft, non-tender, non-distended, bowel sounds positive. Extremities: No clubbing, cyanosis, or edema. Pulses are 2+ Psych: Pt's affect is appropriate. Pt is cooperative. pleasant Skin: warm and dry, no breakdown  noted Neuro:  alert and oriented x4, follows commands, CN 2-12 grossly intact, Mild aphasia-improving. Fair judgement and insight.  Musculoskeletal: NO joint swelling or tenderness noted, moving all 4 extremities Walking without walker Strength RUE 3-4-/5 in RUE shoulder abduction and elbow extension and flexion, finger flexion  Assessment/Plan: 1. Functional deficits which require 3+ hours per day of interdisciplinary therapy in a comprehensive inpatient rehab setting. Physiatrist is providing close team supervision and 24 hour management of active medical problems listed below. Physiatrist and rehab team continue to assess barriers to discharge/monitor patient progress toward functional and medical goals  Care Tool:  Bathing    Body parts bathed by patient: Right arm, Chest, Abdomen, Front perineal area, Buttocks, Right upper leg, Left upper leg, Right lower leg, Left lower leg, Face, Left arm   Body parts bathed by helper: Left arm     Bathing assist Assist Level: Supervision/Verbal cueing     Upper Body Dressing/Undressing Upper body dressing   What is the patient wearing?: Pull over shirt    Upper body assist Assist Level: Supervision/Verbal cueing    Lower Body Dressing/Undressing Lower body dressing      What is the patient wearing?: Pants     Lower body assist Assist for lower body dressing: Supervision/Verbal cueing     Toileting Toileting    Toileting assist Assist for toileting:  Supervision/Verbal cueing     Transfers Chair/bed transfer  Transfers assist     Chair/bed transfer assist level: Independent     Locomotion Ambulation   Ambulation assist      Assist level: Supervision/Verbal cueing Assistive device: No Device Max distance: 300+   Walk 10 feet activity   Assist     Assist level: Independent Assistive device: No Device   Walk 50 feet activity   Assist    Assist level: Independent Assistive device: No Device    Walk  150 feet activity   Assist    Assist level: Supervision/Verbal cueing Assistive device: No Device    Walk 10 feet on uneven surface  activity   Assist     Assist level: Supervision/Verbal cueing Assistive device: Other (comment) (No AD)   Wheelchair     Assist Is the patient using a wheelchair?: Yes Type of Wheelchair: Manual    Wheelchair assist level: Independent Max wheelchair distance: >200'    Wheelchair 50 feet with 2 turns activity    Assist        Assist Level: Independent   Wheelchair 150 feet activity     Assist      Assist Level: Independent   Blood pressure 104/69, pulse 70, temperature 98.1 F (36.7 C), temperature source Oral, resp. rate 16, height 6\' 3"  (1.905 m), weight 105.7 kg, SpO2 94 %.  Medical Problem List and Plan: 1. 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  Antithrombotics: -DVT/anticoagulation:  Mechanical: Antiembolism stockings, thigh (TED hose) Bilateral lower extremities             -antiplatelet therapy: Aspirin 81 mg daily and Plavix 75 mg daily 3. Pain Management: Tylenol as needed 4. Mood/Behavior/Sleep: Provide emotional support             -antipsychotic agents: N/A  -see #16 5. Neuropsych/cognition: This patient is not capable of making decisions on his own behalf. 6. Skin/Wound Care: Routine skin checks 7. Fluids/Electrolytes/Nutrition: Routine in and outs with follow-up chemistries 8.  CAD/history of PCI/chronic systolic congestive heart failure with ejection fraction 20 to 25%.   -Daily weights.  -TEE without thrombus.  Monitor for any signs of fluid overload.   -Continue Farxiga 10 mg daily.  Continue asa and plavix.    -Follow-up  outpatient cardiology services -continue Holter monitor, called company as holter monitor blinking red, called company- noted to be bad contact with skin, contact improved and it turned green, continue to monitor -Wt stable overall Filed Weights   10/21/22 0500 10/22/22 0503 10/23/22 0401  Weight: 104.2 kg 105.7 kg 105.7 kg    9.  Hypertension.  Lisinopril 2.5 mg daily, Toprol-XL 12.5 mg daily.   Monitor with increased mobility  -12/28 well controlled    10/23/2022    4:01 AM 10/22/2022    7:27 PM 10/22/2022    1:29 PM  Vitals with BMI  Weight 233 lbs    BMI 29.13    Systolic 104 107 10/24/2022  Diastolic 69 72 77  Pulse 70 75 70   10.  Hyperlipidemia.  Lipitor 80mg  daily. Heart healthy diet 11.  History of tobacco as well as polysubstance use.  Urine drug screen positive marijuana as well as cocaine.  Provide counseling 12.  Obesity.  BMI 30.70.  Dietary follow-up 13. Leukocytosis: monitor for s/sx of infection  -WNL 12/25 14. Constipation: Miralax QD ordered  -Sorbitol 70% 27mL QD PRN ordered and helped significantly  -12/22 LBM yesterday-improved, continue current regimen -12/23 no BM since 12/21 but denies complaints, knows he has meds ordered if needed, continue current regimen -10/19/22 no BM today so took sorbitol, awaiting response; continue to monitor, encourage PO hydration -12/25 LBM yesterday-improved 15. Chronic b/l knee pain: reports some increased pain in popliteal fossas with increased mobility, no acute complaints/concerns, usually takes BC powder at home  -Tylenol PRN ordered  -Ordered Voltaren 1% gel 2g QID PRN-- not used, knows to ask if needed  -Improved per pt 16. Insomnia: difficulty staying asleep  -Melatonin 3mg  QHS ordered  -12/22 Ordered Trazodone 25mg  QHS PRN-- not used, knows to ask      LOS: 14 days A FACE TO FACE EVALUATION WAS PERFORMED  10/23/2022, 10:36 AM

## 2022-10-31 ENCOUNTER — Encounter: Payer: Medicaid Other | Attending: Physical Medicine & Rehabilitation | Admitting: Physical Medicine & Rehabilitation

## 2022-11-27 ENCOUNTER — Encounter: Payer: Self-pay | Admitting: Emergency Medicine

## 2022-11-27 ENCOUNTER — Emergency Department
Admission: EM | Admit: 2022-11-27 | Discharge: 2022-11-27 | Disposition: A | Discharge status: Home / Self Care | Payer: Medicaid Other | Attending: Emergency Medicine | Admitting: Emergency Medicine

## 2022-11-27 ENCOUNTER — Emergency Department: Payer: Medicaid Other

## 2022-11-27 DIAGNOSIS — R2231 Localized swelling, mass and lump, right upper limb: Secondary | ICD-10-CM | POA: Insufficient documentation

## 2022-11-27 DIAGNOSIS — Z8673 Personal history of transient ischemic attack (TIA), and cerebral infarction without residual deficits: Secondary | ICD-10-CM | POA: Diagnosis not present

## 2022-11-27 DIAGNOSIS — M7989 Other specified soft tissue disorders: Secondary | ICD-10-CM

## 2022-11-27 MED ORDER — METOPROLOL SUCCINATE ER 25 MG PO TB24: 12.5000 mg | ORAL_TABLET | Freq: Every day | ORAL | 0 refills | Status: DC

## 2022-11-27 MED ORDER — MELATONIN 3 MG PO TABS: 3.0000 mg | ORAL_TABLET | Freq: Every day | ORAL | 0 refills | Status: DC

## 2022-11-27 MED ORDER — DICLOFENAC SODIUM 1 % EX GEL: 2.0000 g | Freq: Four times a day (QID) | CUTANEOUS | 0 refills | Status: DC | PRN

## 2022-11-27 MED ORDER — ASPIRIN 81 MG PO CHEW: 81.0000 mg | CHEWABLE_TABLET | Freq: Every day | ORAL | 0 refills | Status: DC

## 2022-11-27 MED ORDER — LISINOPRIL 5 MG PO TABS: 2.5000 mg | ORAL_TABLET | Freq: Every day | ORAL | 0 refills | Status: DC

## 2022-11-27 MED ORDER — CLOPIDOGREL BISULFATE 75 MG PO TABS: 75.0000 mg | ORAL_TABLET | Freq: Every day | ORAL | 0 refills | Status: DC

## 2022-11-27 MED ORDER — ATORVASTATIN CALCIUM 80 MG PO TABS: 80.0000 mg | ORAL_TABLET | Freq: Every day | ORAL | 0 refills | Status: DC

## 2022-11-27 MED ORDER — DAPAGLIFLOZIN PROPANEDIOL 10 MG PO TABS: 10.0000 mg | ORAL_TABLET | Freq: Every day | ORAL | 0 refills | Status: DC

## 2022-11-27 NOTE — ED Triage Notes (Addendum)
Pt presents via POV with complaints of R arm swelling that started ~ 4 days ago. Pt has a hx of CVA with R arm deficits and has noticed increased swelling in his arm. Pt states he has decreased mobility in that arm/hand which isn't new but is concerned about the swelling. He notes not wearing a sling and his arm is often "just hanging down there".  He has tried [facility] powder for pain relief with no improvement. Compliant with his plavix regimen. Denies fall, injury, CP or SOB.

## 2022-11-27 NOTE — ED Provider Notes (Signed)
[facility] Provider Note    Event Date/Time   First MD Initiated Contact with Patient 11/27/22 2005     (approximate)   History   Arm Pain   HPI  Ricardo Yang is a 47 y.o. male   with recent history of a stroke which is left him with weakness in his right arm.  He reports his arm typically hangs at his side.  He is concerned because he has had hand swelling in the right arm, he does not have much sensation in the right arm besides tingling      Physical Exam   Triage Vital Signs: ED Triage Vitals  Enc Vitals Group     BP 11/27/22 1913 (!) 150/90     Pulse Rate 11/27/22 1913 91     Resp 11/27/22 1913 18     Temp 11/27/22 1913 97.9 F (36.6 C)     Temp Source 11/27/22 1913 Oral     SpO2 11/27/22 1913 100 %     Weight 11/27/22 1912 113.4 kg (250 lb)     Height 11/27/22 1912 1.93 m (6\' 4" )     Head Circumference --      Peak Flow --      Pain Score 11/27/22 1912 4     Pain Loc --      Pain Edu? --      Excl. in [place]? --     Most recent vital signs: Vitals:   11/27/22 1913  BP: (!) 150/90  Pulse: 91  Resp: 18  Temp: 97.9 F (36.6 C)  SpO2: 100%     General: Awake, no distress.  CV:  Good peripheral perfusion.  Resp:  Normal effort.  Abd:  No distention.  Other:  Swelling in the right hand, otherwise arm is warm and well-perfused, no rash   ED Results / Procedures / Treatments   Labs (all labs ordered are listed, but only abnormal results are displayed) Labs Reviewed - No data to display   EKG     RADIOLOGY Ultrasound    PROCEDURES:  Critical Care performed:   Procedures   MEDICATIONS ORDERED IN ED: Medications - No data to display   IMPRESSION / MDM / [place] / ED COURSE  I reviewed the triage vital signs and the nursing notes. Patient's presentation is most consistent with acute complicated illness / injury requiring diagnostic workup.  Swelling could be related to weakness/dependency of the  right arm but also he is at risk for DVT.  Will send for ultrasound  Ultrasound of the upper extremity is negative for DVT.  Patient reports he is out of his medications and need a refill, home medication refill provided        FINAL CLINICAL IMPRESSION(S) / ED DIAGNOSES   Final diagnoses:  Swelling of right upper extremity     Rx / DC Orders   ED Discharge Orders          Ordered    metoprolol succinate (TOPROL-XL) 25 MG 24 hr tablet  Daily        11/27/22 2012    melatonin 3 MG TABS tablet  Daily at bedtime        11/27/22 2012    lisinopril (ZESTRIL) 5 MG tablet  Daily        11/27/22 2012    diclofenac Sodium (VOLTAREN) 1 % GEL  4 times daily PRN        11/27/22 2012  dapagliflozin propanediol (FARXIGA) 10 MG TABS tablet  Daily        11/27/22 2012    clopidogrel (PLAVIX) 75 MG tablet  Daily with breakfast        11/27/22 2012    atorvastatin (LIPITOR) 80 MG tablet  Daily-1800        11/27/22 2012    aspirin 81 MG chewable tablet  Daily        11/27/22 2012             Note:  This document was prepared using Dragon voice recognition software and may include unintentional dictation errors.   Lavonia Drafts, MD 11/27/22 [phone]

## 2022-11-27 NOTE — Discharge Instructions (Signed)
Your ultrasound was negative for any clots. Please elevate your arm occasionally to see if this helps with the swelling

## 2022-12-04 ENCOUNTER — Ambulatory Visit (INDEPENDENT_AMBULATORY_CARE_PROVIDER_SITE_OTHER): Payer: Medicaid Other | Admitting: Family

## 2022-12-04 ENCOUNTER — Encounter: Payer: Self-pay | Admitting: Family

## 2022-12-04 VITALS — BP 125/85 | HR 73 | Temp 97.6°F | Ht 76.0 in | Wt 263.2 lb

## 2022-12-04 DIAGNOSIS — E782 Mixed hyperlipidemia: Secondary | ICD-10-CM

## 2022-12-04 DIAGNOSIS — Z9861 Coronary angioplasty status: Secondary | ICD-10-CM

## 2022-12-04 DIAGNOSIS — I10 Essential (primary) hypertension: Secondary | ICD-10-CM

## 2022-12-04 DIAGNOSIS — I6930 Unspecified sequelae of cerebral infarction: Secondary | ICD-10-CM | POA: Diagnosis not present

## 2022-12-04 DIAGNOSIS — G8191 Hemiplegia, unspecified affecting right dominant side: Secondary | ICD-10-CM

## 2022-12-04 DIAGNOSIS — I2510 Atherosclerotic heart disease of native coronary artery without angina pectoris: Secondary | ICD-10-CM | POA: Diagnosis not present

## 2022-12-04 DIAGNOSIS — I252 Old myocardial infarction: Secondary | ICD-10-CM

## 2022-12-04 MED ORDER — METOPROLOL SUCCINATE ER 25 MG PO TB24: 12.5000 mg | ORAL_TABLET | Freq: Every day | ORAL | 0 refills | Status: DC

## 2022-12-04 MED ORDER — ATORVASTATIN CALCIUM 80 MG PO TABS: 80.0000 mg | ORAL_TABLET | Freq: Every day | ORAL | 0 refills | Status: DC

## 2022-12-04 MED ORDER — CLOPIDOGREL BISULFATE 75 MG PO TABS: 75.0000 mg | ORAL_TABLET | Freq: Every day | ORAL | 0 refills | Status: DC

## 2022-12-04 MED ORDER — LISINOPRIL 2.5 MG PO TABS: 2.5000 mg | ORAL_TABLET | Freq: Every day | ORAL | 5 refills | Status: DC

## 2022-12-04 MED ORDER — DAPAGLIFLOZIN PROPANEDIOL 10 MG PO TABS: 10.0000 mg | ORAL_TABLET | Freq: Every day | ORAL | 0 refills | Status: DC

## 2022-12-04 NOTE — Progress Notes (Signed)
New Patient Office Visit  Subjective:  Patient ID: Ricardo Yang, male    DOB: Aug 27, 1976  Age: 47 y.o. MRN: [id number]  CC:  Chief Complaint  Patient presents with   New Patient (Initial Visit)   Arm Pain    Pt c/o right arm pain since he had a stroke on 10/04/22, painful and achy. Has tried Bc powder which does help.   Hypertension   Hyperlipidemia   Coronary Artery Disease    HPI Ricardo L [place] presents for establishing care today.  Hypertension/CAD/MI hx x 2: Patient is currently maintained on the following medications for blood pressure: Lisinopril, Metoprolol; Plavix since having PTCA w/stent after MI & daily ASA. Failed meds include: none- Hx of smoking, quit about 2 yrs ago, also heavy alcohol  & cocaine use in past that he states he is no longer using. Patient reports good compliance with blood pressure medications. Patient denies chest pain, headaches, shortness of breath or swelling. Last 3 blood pressure readings in our office are as follows: BP Readings from Last 3 Encounters:  12/04/22 125/85  11/27/22 (!) 150/90  10/23/22 104/69   Recent CVA:  hospitalized 09/2022 - pt states he could not walk or talk when first occurred, states he went to rehab and has improved a lot, but still having residual weakness & swelling in right arm. Started on Farxiga, Lipitor, restarted Lisinopril, Metoprolol, Plavix, ASA, given 30d refills at recent hospital ER visit for his arm swelling and needs ongoing refills.   Assessment & Plan:   Problem List Items Addressed This Visit       Cardiovascular and Mediastinum   Essential hypertension - Primary    chronic, stable MI x 2, last one pt reports in 2021 stopped smoking but lost insurance & could not f/u with PCP, ran out of med refills refilling Lisinopril 2.82m & Metoprolol 24mtoday, pt taking daily ASA referring to Cardiology      Relevant Medications   atorvastatin (LIPITOR) 80 MG tablet   dapagliflozin propanediol  (FARXIGA) 10 MG TABS tablet   lisinopril (ZESTRIL) 2.5 MG tablet   metoprolol succinate (TOPROL-XL) 25 MG 24 hr tablet   Other Relevant Orders   Ambulatory referral to Cardiology   CAD S/P percutaneous coronary angioplasty    chronic MI x 2, last one pt reports in 2021 stopped smoking but lost insurance & could not f/u with PCP, ran out of med refills sending Plavix today, pt taking daily ASA referring to Cardiology      Relevant Medications   atorvastatin (LIPITOR) 80 MG tablet   clopidogrel (PLAVIX) 75 MG tablet   lisinopril (ZESTRIL) 2.5 MG tablet   metoprolol succinate (TOPROL-XL) 25 MG 24 hr tablet   Other Relevant Orders   Ambulatory referral to Cardiology     Nervous and Auditory   [place]ip[place]   CVA in 09/2022, residual SE all of right side affected, did inpatient rehab just right arm still swollen and weak, pt states swelling has decreased significantly advised to continue to elevate as much as possible continue ROM exercises taught in rehab f/u prn        Other   History of CVA with residual deficit    CVA 09/2022 right arm swelling, weakness taking Plavix, ASA, Lipitor      Relevant Medications   atorvastatin (LIPITOR) 80 MG tablet   Mixed hyperlipidemia    chronic taking Lipitor 8024md sending refill f/u 3 mos for lab work  Relevant Medications   atorvastatin (LIPITOR) 80 MG tablet   lisinopril (ZESTRIL) 2.5 MG tablet   metoprolol succinate (TOPROL-XL) 25 MG 24 hr tablet   History of MI (myocardial infarction)   Relevant Orders   Ambulatory referral to Cardiology    Subjective:    Outpatient Medications Prior to Visit  Medication Sig Dispense Refill   acetaminophen (TYLENOL) 325 MG tablet Take 2 tablets (650 mg total) by mouth every 4 (four) hours as needed for mild pain (or temp > 37.5 C (99.5 F)).     aspirin 81 MG chewable tablet Chew 1 tablet (81 mg total) by mouth daily. 30 tablet 0   polyethylene glycol (MIRALAX /  GLYCOLAX) 17 g packet Take 17 g by mouth daily. 14 each 0   clopidogrel (PLAVIX) 75 MG tablet Take 1 tablet (75 mg total) by mouth daily with breakfast. 30 tablet 0   dapagliflozin propanediol (FARXIGA) 10 MG TABS tablet Take 1 tablet (10 mg total) by mouth daily. 30 tablet 0   lisinopril (ZESTRIL) 5 MG tablet Take (1/2) tablet (2.5 mg total) by mouth daily. 15 tablet 0   metoprolol succinate (TOPROL-XL) 25 MG 24 hr tablet Take 0.5 tablets (12.5 mg total) by mouth daily. 30 tablet 0   atorvastatin (LIPITOR) 80 MG tablet Take 1 tablet (80 mg total) by mouth daily at 6 PM. (Patient not taking: Reported on 12/04/2022) 30 tablet 0   diclofenac Sodium (VOLTAREN) 1 % GEL Apply 2 g topically 4 (four) times daily as needed (moderate pain). (Patient not taking: Reported on 12/04/2022) 2 g 0   melatonin 3 MG TABS tablet Take 1 tablet (3 mg total) by mouth at bedtime. (Patient not taking: Reported on 12/04/2022) 30 tablet 0   No facility-administered medications prior to visit.   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 question possible MS   NSTEMI (non-ST elevated myocardial infarction) ([place]) 05/25/2015   Obesity    Polysubstance abuse ([place])    a. tobacco, cocaine, crack, ecstasy, pills, "anything but injections"   Primary hypertension 10/05/2022   STEMI (ST elevation myocardial infarction) ([place]) 08/06/2019   Past Surgical History:  Procedure Laterality Date   CARDIAC  CATHETERIZATION N/A 05/28/2015   Procedure: Left Heart Cath and Coronary Angiography;  Surgeon: Minna Merritts, MD;  Location: [facility];  Service: Cardiovascular;  Laterality: N/A;   CORONARY ANGIOGRAPHY N/A 04/22/2021   Procedure: CORONARY ANGIOGRAPHY;  Surgeon: Isaias Cowman, MD;  Location: [facility];  Service: Cardiovascular;  Laterality: N/A;   CORONARY/GRAFT ACUTE MI REVASCULARIZATION N/A 08/06/2019   Procedure: Coronary/Graft Acute MI Revascularization;  Surgeon: Isaias Cowman, MD;  Location: [facility];  Service: Cardiovascular;  Laterality: N/A;   LEFT HEART CATH N/A 04/22/2021   Procedure: Left Heart Cath;  Surgeon: Isaias Cowman, MD;  Location: [facility];  Service: Cardiovascular;  Laterality: N/A;   LEFT HEART CATH AND CORONARY ANGIOGRAPHY N/A 08/06/2019  Procedure: LEFT HEART CATH AND CORONARY ANGIOGRAPHY;  Surgeon: Isaias Cowman, MD;  Location: [facility];  Service: Cardiovascular;  Laterality: N/A;   TEE WITHOUT CARDIOVERSION N/A 10/08/2022   Procedure: TRANSESOPHAGEAL ECHOCARDIOGRAM (TEE);  Surgeon: Kate Sable, MD;  Location: [facility];  Service: Cardiovascular;  Laterality: N/A;  11:30am    Objective:   Today's Vitals: BP 125/85 (BP Location: Left Arm, Patient Position: Sitting, Cuff Size: Large)   Pulse 73   Temp 97.6 F (36.4 C) (Temporal)   Ht 6' 4"$  (1.93 m)   Wt 263 lb 4 oz (119.4 kg)   SpO2 95%   BMI 32.04 kg/m   Physical Exam Vitals and nursing note reviewed.  Constitutional:      General: He is not in acute distress.    Appearance: Normal appearance. He is obese.  HENT:     Head: Normocephalic.  Cardiovascular:     Rate and Rhythm: Normal rate and regular rhythm.  Pulmonary:     Effort: Pulmonary effort is normal.     Breath sounds: Normal breath sounds.  Musculoskeletal:        General: Normal range of motion.     Right upper arm: Swelling (no erythema, 2+  non-pitting edema) present. No tenderness or bony tenderness.     Cervical back: Normal range of motion.  Skin:    General: Skin is warm and dry.  Neurological:     Mental Status: He is alert and oriented to person, place, and time.  Psychiatric:        Mood and Affect: Mood normal.    Meds ordered this encounter  Medications   atorvastatin (LIPITOR) 80 MG tablet    Sig: Take 1 tablet (80 mg total) by mouth daily at 6 PM.    Dispense:  30 tablet    Refill:  0    Order Specific Question:   Supervising Provider    Answer:   ANDY, CAMILLE L [[id number]]   clopidogrel (PLAVIX) 75 MG tablet    Sig: Take 1 tablet (75 mg total) by mouth daily with breakfast.    Dispense:  30 tablet    Refill:  0    Order Specific Question:   Supervising Provider    Answer:   ANDY, CAMILLE L [[id number]]   dapagliflozin propanediol (FARXIGA) 10 MG TABS tablet    Sig: Take 1 tablet (10 mg total) by mouth daily.    Dispense:  30 tablet    Refill:  0    Order Specific Question:   Supervising Provider    Answer:   ANDY, CAMILLE L [[id number]]   lisinopril (ZESTRIL) 2.5 MG tablet    Sig: Take 1 tablet (2.5 mg total) by mouth daily.    Dispense:  30 tablet    Refill:  5    Order Specific Question:   Supervising Provider    Answer:   ANDY, CAMILLE L [[id number]]   metoprolol succinate (TOPROL-XL) 25 MG 24 hr tablet    Sig: Take 0.5 tablets (12.5 mg total) by mouth daily.    Dispense:  30 tablet    Refill:  0    Order Specific Question:   Supervising Provider    Answer:   ANDY, CAMILLE L [[id number]]   *Extra time (30 min) in addition to time spent with patient today which consisted of chart review (ER & hospital visits, imaging, labs, past cardiac hx), discussing diagnoses, work up, treatment, answering questions, and documentation.   Jeanie Sewer, NP

## 2022-12-04 NOTE — Patient Instructions (Addendum)
Welcome to [facility], It was a pleasure meeting you today!    As discussed, I have sent your refills to your pharmacy.  I have sent a referral to our Cardiology office in [place].  Please schedule a 3 month follow up visit today.     PLEASE NOTE: If you had any LAB tests please let us know if you have not heard back within a few days. You may see your results on MyChart before we have a chance to review them but we will give you a call once they are reviewed by [place]. If we ordered any REFERRALS today, please let us know if you have not heard from their office within the next week.  Let us know through MyChart if you are needing REFILLS, or have your pharmacy send [place] the request. You can also use MyChart to communicate with me or any office staff.  Please try these tips to maintain a healthy lifestyle: It is important that you exercise regularly at least 30 minutes 5 times a week. Think about what you will eat, plan ahead. Choose whole foods, & think  "clean, green, fresh or frozen" over canned, processed or packaged foods which are more sugary, salty, and fatty. 70 to 75% of food eaten should be fresh vegetables and protein. 2-3  meals daily with healthy snacks between meals, but must be whole fruit, protein or vegetables. Aim to eat over a 10 hour period when you are active, for example, 7am to 5pm, and then STOP after your last meal of the day, drinking only water.  Shorter eating windows, 6-8 hours, are showing benefits in heart disease and blood sugar regulation. Drink water every day! Shoot for 64 ounces daily = 8 cups, no other drink is as healthy! Fruit juice is best enjoyed in a healthy way, by EATING the fruit.

## 2022-12-05 NOTE — Assessment & Plan Note (Signed)
chronic taking Lipitor 46m qd sending refill f/u 3 mos for lab work

## 2022-12-05 NOTE — Assessment & Plan Note (Signed)
CVA 09/2022 right arm swelling, weakness taking Plavix, ASA, Lipitor

## 2022-12-05 NOTE — Assessment & Plan Note (Addendum)
CVA in 09/2022, residual SE all of right side affected, did inpatient rehab just right arm still swollen and weak, pt states swelling has decreased significantly advised to continue to elevate as much as possible continue ROM exercises taught in rehab f/u prn

## 2022-12-05 NOTE — Assessment & Plan Note (Signed)
chronic MI x 2, last one pt reports in 2021 stopped smoking but lost insurance & could not f/u with PCP, ran out of med refills sending Plavix today, pt taking daily ASA referring to Cardiology

## 2022-12-05 NOTE — Assessment & Plan Note (Signed)
chronic, stable MI x 2, last one pt reports in 2021 stopped smoking but lost insurance & could not f/u with PCP, ran out of med refills refilling Lisinopril 2.42m & Metoprolol 229mtoday, pt taking daily ASA referring to Cardiology

## 2023-01-01 ENCOUNTER — Ambulatory Visit (INDEPENDENT_AMBULATORY_CARE_PROVIDER_SITE_OTHER): Payer: Medicaid Other | Admitting: Family Medicine

## 2023-01-01 ENCOUNTER — Inpatient Hospital Stay (INDEPENDENT_AMBULATORY_CARE_PROVIDER_SITE_OTHER): Payer: Medicaid Other | Admitting: Radiology

## 2023-01-01 ENCOUNTER — Encounter: Payer: Self-pay | Admitting: Family Medicine

## 2023-01-01 VITALS — BP 112/68 | HR 86 | Ht 76.0 in | Wt 260.0 lb

## 2023-01-01 DIAGNOSIS — M67911 Unspecified disorder of synovium and tendon, right shoulder: Secondary | ICD-10-CM | POA: Insufficient documentation

## 2023-01-01 DIAGNOSIS — I209 Angina pectoris, unspecified: Secondary | ICD-10-CM

## 2023-01-01 DIAGNOSIS — I10 Essential (primary) hypertension: Secondary | ICD-10-CM

## 2023-01-01 DIAGNOSIS — G8191 Hemiplegia, unspecified affecting right dominant side: Secondary | ICD-10-CM

## 2023-01-01 DIAGNOSIS — I2510 Atherosclerotic heart disease of native coronary artery without angina pectoris: Secondary | ICD-10-CM | POA: Diagnosis not present

## 2023-01-01 DIAGNOSIS — Z9861 Coronary angioplasty status: Secondary | ICD-10-CM

## 2023-01-01 DIAGNOSIS — G4730 Sleep apnea, unspecified: Secondary | ICD-10-CM | POA: Insufficient documentation

## 2023-01-01 DIAGNOSIS — I6930 Unspecified sequelae of cerebral infarction: Secondary | ICD-10-CM

## 2023-01-01 DIAGNOSIS — I5022 Chronic systolic (congestive) heart failure: Secondary | ICD-10-CM

## 2023-01-01 DIAGNOSIS — F1911 Other psychoactive substance abuse, in remission: Secondary | ICD-10-CM

## 2023-01-01 MED ORDER — DAPAGLIFLOZIN PROPANEDIOL 10 MG PO TABS: 10.0000 mg | ORAL_TABLET | Freq: Every day | ORAL | 0 refills | Status: DC

## 2023-01-01 MED ORDER — CLOPIDOGREL BISULFATE 75 MG PO TABS: 75.0000 mg | ORAL_TABLET | Freq: Every day | ORAL | 0 refills | Status: DC

## 2023-01-01 MED ORDER — BACLOFEN 5 MG PO TABS: 5.0000 mg | ORAL_TABLET | Freq: Every evening | ORAL | 1 refills | Status: DC | PRN

## 2023-01-01 MED ORDER — TRIAMCINOLONE ACETONIDE 40 MG/ML IJ SUSP
40.0000 mg | Freq: Once | INTRAMUSCULAR | Status: AC
  Administered 2023-01-01: 40 mg via INTRAMUSCULAR

## 2023-01-01 MED ORDER — METOPROLOL SUCCINATE ER 25 MG PO TB24: 12.5000 mg | ORAL_TABLET | Freq: Every day | ORAL | 0 refills | Status: DC

## 2023-01-01 MED ORDER — ATORVASTATIN CALCIUM 80 MG PO TABS: 80.0000 mg | ORAL_TABLET | Freq: Every day | ORAL | 0 refills | Status: DC

## 2023-01-01 NOTE — Patient Instructions (Addendum)
You have just been given a cortisone injection to reduce pain and inflammation. After the injection you may notice immediate relief of pain as a result of the Lidocaine. It is important to rest the area of the injection for 24 to 48 hours after the injection. There is a possibility of some temporary increased discomfort and swelling for up to 72 hours until the cortisone begins to work. If you do have pain, simply rest the joint and use ice. If you can tolerate over the counter medications, you can try Tylenol for added relief per package instructions.  [facility] [phone]

## 2023-01-01 NOTE — Progress Notes (Signed)
Primary Care / Sports Medicine Office Visit  Patient Information:  Patient ID: Ricardo Yang, male DOB: 07/22/1976 Age: 47 y.o. MRN: [id number]   Ricardo Yang is a pleasant 47 y.o. male presenting with the following:  Chief Complaint  Patient presents with   Establish Care    Vitals:   01/01/23 1005  BP: 112/68  Pulse: 86  SpO2: 97%   Vitals:   01/01/23 1005  Weight: 260 lb (117.9 kg)  Height: 6\' 4"  (1.93 m)   Body mass index is 31.65 kg/m.  No results found.   Independent interpretation of notes and tests performed by another provider:   None  Procedures performed:   Procedure:  Injection of right subacromial space under ultrasound guidance. Ultrasound guidance utilized for in-plane approach to right subacromial space, no overt tendinopathy noted Samsung HS60 device utilized with permanent recording / reporting. Verbal informed consent obtained and verified. Skin prepped in a sterile fashion. Ethyl chloride for topical local analgesia.  Completed without difficulty and tolerated well. Medication: triamcinolone acetonide 40 mg/mL suspension for injection 1 mL total and 2 mL lidocaine 1% without epinephrine utilized for needle placement anesthetic Advised to contact for fevers/chills, erythema, induration, drainage, or persistent bleeding.   Pertinent History, Exam, Impression, and Recommendations:   Ricardo was seen today for establish care.  History of CVA with residual deficit -     Atorvastatin Calcium; Take 1 tablet (80 mg total) by mouth daily at 6 PM.  Dispense: 30 tablet; Refill: 0 -     Ambulatory referral to Physical Medicine Rehab  CAD S/P percutaneous coronary angioplasty -     Clopidogrel Bisulfate; Take 1 tablet (75 mg total) by mouth daily with breakfast.  Dispense: 30 tablet; Refill: 0 -     Ambulatory referral to Physical Therapy  Essential hypertension Assessment & Plan: Chronic, controlled on current regimen.  Orders: -      Dapagliflozin Propanediol; Take 1 tablet (10 mg total) by mouth daily.  Dispense: 30 tablet; Refill: 0 -     Metoprolol Succinate ER; Take 0.5 tablets (12.5 mg total) by mouth daily.  Dispense: 30 tablet; Refill: 0  Sleep apnea, unspecified type Assessment & Plan: Noted on diagnosis list, no current treatments, will place referral to pulmonology/sleep medicine for establishment of care, evaluation and management of OSA.  Orders: -     Ambulatory referral to Pulmonology  Dysfunction of right rotator cuff Assessment & Plan: In setting of right-sided hemiplegia following CVA.  Does demonstrate focal findings to the right rotator cuff separate from his noted hemiplegia, additionally elements of adhesive capsulitis.  He is reporting pain, primarily at night while raising clinical suspicion for rotator cuff dysfunction related symptomatology.  Given his comorbid medical conditions, relative contraindications to broad pharmacotherapeutic options, he did elect to proceed with a subacromial corticosteroid injection with ultrasound guidance.  He tolerated this well, will start formal physical therapy as well.  Orders: -     [place] LIMITED JOINT SPACE STRUCTURES UP RIGHT; Future -     Baclofen; Take 1 tablet (5 mg total) by mouth at bedtime as needed.  Dispense: 30 tablet; Refill: 1 -     Triamcinolone Acetonide  Right hemiplegia (HCC) Overview: CVA in 09/2022, residual SE all of right side affected, did inpatient rehab   Assessment & Plan: In the setting of CVA from 09/2022, did complete inpatient rehab, continues to note weakness, new referral to both physical medicine rehabilitation as well as physical therapy  placed today.      Orders & Medications Meds ordered this encounter  Medications   atorvastatin (LIPITOR) 80 MG tablet    Sig: Take 1 tablet (80 mg total) by mouth daily at 6 PM.    Dispense:  30 tablet    Refill:  0   clopidogrel (PLAVIX) 75 MG tablet    Sig: Take 1 tablet (75 mg  total) by mouth daily with breakfast.    Dispense:  30 tablet    Refill:  0   dapagliflozin propanediol (FARXIGA) 10 MG TABS tablet    Sig: Take 1 tablet (10 mg total) by mouth daily.    Dispense:  30 tablet    Refill:  0   metoprolol succinate (TOPROL-XL) 25 MG 24 hr tablet    Sig: Take 0.5 tablets (12.5 mg total) by mouth daily.    Dispense:  30 tablet    Refill:  0   Baclofen 5 MG TABS    Sig: Take 1 tablet (5 mg total) by mouth at bedtime as needed.    Dispense:  30 tablet    Refill:  1   triamcinolone acetonide (KENALOG-40) injection 40 mg   Orders Placed This Encounter  Procedures   [place] LIMITED JOINT SPACE STRUCTURES UP RIGHT   Ambulatory referral to Physical Therapy   Ambulatory referral to Physical Medicine Rehab   Ambulatory referral to Pulmonology     No follow-ups on file.     Montel Culver, MD, [facility]   Primary Care Sports Medicine Primary Care and Sports Medicine at [facility]

## 2023-01-02 ENCOUNTER — Encounter: Payer: Self-pay | Admitting: Family Medicine

## 2023-01-02 NOTE — Assessment & Plan Note (Signed)
In setting of right-sided hemiplegia following CVA.  Does demonstrate focal findings to the right rotator cuff separate from his noted hemiplegia, additionally elements of adhesive capsulitis.  He is reporting pain, primarily at night while raising clinical suspicion for rotator cuff dysfunction related symptomatology.  Given his comorbid medical conditions, relative contraindications to broad pharmacotherapeutic options, he did elect to proceed with a subacromial corticosteroid injection with ultrasound guidance.  He tolerated this well, will start formal physical therapy as well.

## 2023-01-02 NOTE — Assessment & Plan Note (Signed)
Chronic, controlled on current regimen.

## 2023-01-02 NOTE — Assessment & Plan Note (Signed)
Noted on diagnosis list, no current treatments, will place referral to pulmonology/sleep medicine for establishment of care, evaluation and management of OSA.

## 2023-01-02 NOTE — Assessment & Plan Note (Signed)
In the setting of CVA from 09/2022, did complete inpatient rehab, continues to note weakness, new referral to both physical medicine rehabilitation as well as physical therapy placed today.

## 2023-01-07 ENCOUNTER — Other Ambulatory Visit: Payer: Self-pay | Admitting: Family Medicine

## 2023-01-07 DIAGNOSIS — I10 Essential (primary) hypertension: Secondary | ICD-10-CM

## 2023-01-07 DIAGNOSIS — I6930 Unspecified sequelae of cerebral infarction: Secondary | ICD-10-CM

## 2023-01-13 ENCOUNTER — Ambulatory Visit: Payer: Medicaid Other | Attending: Family Medicine | Admitting: Physical Therapy

## 2023-01-13 ENCOUNTER — Encounter: Payer: Self-pay | Admitting: Physical Therapy

## 2023-01-13 ENCOUNTER — Other Ambulatory Visit: Payer: Self-pay

## 2023-01-13 DIAGNOSIS — R2689 Other abnormalities of gait and mobility: Secondary | ICD-10-CM | POA: Diagnosis present

## 2023-01-13 DIAGNOSIS — R279 Unspecified lack of coordination: Secondary | ICD-10-CM | POA: Diagnosis present

## 2023-01-13 DIAGNOSIS — M6281 Muscle weakness (generalized): Secondary | ICD-10-CM | POA: Insufficient documentation

## 2023-01-13 DIAGNOSIS — R2681 Unsteadiness on feet: Secondary | ICD-10-CM | POA: Insufficient documentation

## 2023-01-13 NOTE — Therapy (Signed)
OUTPATIENT PHYSICAL THERAPY NEURO EVALUATION   Patient Name: Ricardo Yang MRN: [id number] DOB:[birth year], 47 y.o., male Today's Date: 01/13/2023   PCP: Matthews, [place] PROVIDER: Montel Culver   END OF SESSION:  PT End of Session - 01/13/23 0752     Visit Number 1    Number of Visits 24    Progress Note Due on Visit 10    PT Start Time 0800    PT Stop Time 0850    PT Time Calculation (min) 50 min    Equipment Utilized During Treatment Gait belt    Activity Tolerance Patient tolerated treatment well    Behavior During Therapy [facility] for tasks assessed/performed             Past Medical History:  Diagnosis Date   Acute CVA (cerebrovascular accident) ([place]) 10/04/2022   Acute ST elevation myocardial infarction (STEMI) involving left anterior descending (LAD) coronary artery ([place]) 08/06/2019   Anxiety 09/2022   After stroke   CAD (coronary artery disease) 10/04/2022   Cerebrovascular accident (CVA) ([place]) 10/07/2022   Chest pain    Cocaine abuse ([place])    Cocaine use 10/06/2022   Depression December 2023   When stroke happened   Elevated troponin    Encounter for imaging to screen for metal prior to MRI 10/07/2022   Heavy smoker    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 question possible MS   NSTEMI (non-ST elevated myocardial infarction) ([place]) 05/25/2015   Obesity    Polysubstance abuse ([place])    a. tobacco, cocaine, crack, ecstasy, pills, "anything but injections"   Primary hypertension 10/05/2022   Sleep apnea    Always   STEMI (ST elevation myocardial infarction) ([place]) 08/06/2019   Past Surgical History:  Procedure Laterality Date   CARDIAC CATHETERIZATION N/A 05/28/2015   Procedure: Left Heart Cath and Coronary Angiography;  Surgeon: Minna Merritts, MD;  Location: [place] [facility];  Service: Cardiovascular;  Laterality: N/A;   CORONARY ANGIOGRAPHY N/A 04/22/2021   Procedure: CORONARY  ANGIOGRAPHY;  Surgeon: Isaias Cowman, MD;  Location: [place] [facility];  Service: Cardiovascular;  Laterality: N/A;   CORONARY/GRAFT ACUTE MI REVASCULARIZATION N/A 08/06/2019   Procedure: Coronary/Graft Acute MI Revascularization;  Surgeon: Isaias Cowman, MD;  Location: [place] [facility];  Service: Cardiovascular;  Laterality: N/A;   LEFT HEART CATH N/A 04/22/2021   Procedure: Left Heart Cath;  Surgeon: Isaias Cowman, MD;  Location: [place] [facility];  Service: Cardiovascular;  Laterality: N/A;   LEFT HEART CATH AND CORONARY ANGIOGRAPHY N/A 08/06/2019   Procedure: LEFT HEART CATH AND CORONARY ANGIOGRAPHY;  Surgeon: Isaias Cowman, MD;  Location: [place] [facility];  Service: Cardiovascular;  Laterality: N/A;   TEE WITHOUT CARDIOVERSION N/A 10/08/2022   Procedure: TRANSESOPHAGEAL ECHOCARDIOGRAM (TEE);  Surgeon: Kate Sable, MD;  Location: [facility];  Service: Cardiovascular;  Laterality: N/A;  11:30am   Patient Active Problem List   Diagnosis Date Noted   Sleep apnea 01/01/2023   Dysfunction of right rotator cuff 01/01/2023   History of CVA with residual deficit 12/04/2022   Mixed hyperlipidemia 12/04/2022   History of MI (myocardial infarction) 12/04/2022   Insomnia 10/17/2022   Slow transit constipation 10/17/2022   Chronic pain of both knees 10/17/2022   History of substance abuse ([place]) 10/15/2022   HFrEF (heart failure with reduced ejection fraction) ([place]) 10/07/2022   Expressive aphasia 10/05/2022   Right hemiplegia ([place]) 10/05/2022  CAD S/P percutaneous coronary angioplasty 10/05/2022   Polysubstance abuse ([place]) 10/04/2022   Essential hypertension 04/22/2021   Ischemic chest pain [facility])     ONSET DATE: 09/2022  REFERRING DIAG: CVA of ACA, left MCA and likely of the right PCA territory with a large infarct involving the left postcentral gyrus.   THERAPY DIAG:  Unsteadiness on feet  Balance disorder  Muscle weakness  (generalized)  Unspecified lack of coordination  Rationale for Evaluation and Treatment: Rehabilitation  SUBJECTIVE:                                                                                                                                                                                             SUBJECTIVE STATEMENT: Pt reports that he is coming to Rehab to improved balance and strength in the RUE. Reports that he attempted to start therapy in [place], but pt was unable to participate in virtual appointments.  Causing 3 month delay in rehab following CIR. Pt also reports that RUE has regressed since d/c from hospital Pt accompanied by: self  PERTINENT HISTORY: CVA in 12/23. Multiple Cardiac caths over the last 5 years   PAIN:  Are you having pain? Yes: NPRS scale: 3/10 Pain location: R shoulder Pain description: pulling Aggravating factors: movement Relieving factors: heat and pain meds  PRECAUTIONS: Fall  WEIGHT BEARING RESTRICTIONS: No  FALLS: Has patient fallen in last 6 months? No  LIVING ENVIRONMENT: Lives with: lives alone and with room mate Lives in: House/apartment Stairs: Yes: External: 5 steps; on right going up and on left going up Has following equipment at home: None  PLOF: Independent and Independent with basic ADLs  PATIENT GOALS: put on pants with buckle. Improved balance and safety with gait   OBJECTIVE:   DIAGNOSTIC FINDINGS:  EXAM: Right UPPER EXTREMITY VENOUS DOPPLER ULTRASOUND IMPRESSION: No evidence of DVT within the right upper extremity.  EXAM: MRI HEAD WITHOUT CONTRAST IMPRESSION: 1. Acute infarcts in the left ACA, left MCA, and likely the right PCA territory, with a large infarct involving the left postcentral gyrus. Given distribution, these are favored to represent infarcts related to a central embolic etiology. There is a focal region in the right occipital lobe where there is likely a small amount of subarachnoid blood products,  which could suggest an underlying component of vasculitis. Recommend further evaluation with echocardiography. 2. Redemonstrated periventricular and subcortical T2/FLAIR hyperintense signal, as well as at the root entry zone of the right trigeminal nerve, which are suggestive of an underlying demyelinating disease.  COGNITION: Overall cognitive status: Within functional limits for tasks assessed   SENSATION: Light touch: Impaired  RUE   COORDINATION: Ankle to knee WFL BLE Finger to nose. Dysmetria and hemiplegia on the RUE     MUSCLE TONE: RLE: Within functional limits  MUSCLE LENGTH: Hamstrings: Right WFL ; Left WFL  Thomas test: Right WFL; Left WFL   DTRs:  Not tested  POSTURE: rounded shoulders, forward head, and anterior pelvic tilt  LOWER EXTREMITY ROM:     Active  Right Eval Left Eval  Hip flexion    Hip extension    Hip abduction    Hip adduction    Hip internal rotation    Hip external rotation    Knee flexion    Knee extension    Ankle dorsiflexion    Ankle plantarflexion    Ankle inversion    Ankle eversion     (Blank rows = not tested)  LOWER EXTREMITY MMT:    MMT Right Eval Left Eval  Hip flexion 4- 4-  Hip extension 4 4  Hip abduction 4+ 4+  Hip adduction 5 5  Hip internal rotation    Hip external rotation    Knee flexion 4+ 4+  Knee extension 5 5  Ankle dorsiflexion 4 4-  Ankle plantarflexion 4 4  Ankle inversion    Ankle eversion    (Blank rows = not tested)  BED MOBILITY:  Sit to supine Complete Independence Supine to sit Complete Independence  TRANSFERS: Assistive device utilized: None  Sit to stand: Complete Independence Stand to sit: Complete Independence Chair to chair: Complete Independence Floor: SBA  RAMP:  Level of Assistance: Complete Independence Assistive device utilized: None Ramp Comments: mild foot drop on the LLE   CURB:  Level of Assistance: Complete Independence Assistive device utilizeNoned:   Curb Comments: mild foot drop noted on the LLE   STAIRS: Level of Assistance: Modified independence Stair Negotiation Technique: Alternating Pattern  with No Rails Number of Stairs: 4  Height of Stairs: 6  Comments: foot drop noted on the LLE. But no LOB on this day  GAIT: Gait pattern: decreased arm swing- Right and decreased ankle dorsiflexion- Left Distance walked: 150 Assistive device utilized: None Level of assistance: Complete Independence Comments: foot drop on the LLE. Pt reports that this is a chronic issue  FUNCTIONAL TESTS:  5 times sit to stand: 19.23 with UE support, 19.82 with UE pushing from thighs  Timed up and go (TUG): 15.69 6 minute walk test: TBD 10 meter walk test: 11.6 normal and 9.5 fast  Functional gait assessment: 24  PATIENT SURVEYS:  FOTO 68   TODAY'S TREATMENT:                                                                                                                              DATE: 01/13/2023     PATIENT EDUCATION: Education details: POC. Rehab potential. Goals.   Person educated: Patient Education method: Explanation Education comprehension: verbalized understanding  HOME EXERCISE PROGRAM: To be given on next appointment.  GOALS: Goals reviewed with patient? Yes  SHORT TERM GOALS: Target date: 02/24/2023   Pt will be independent with HEP  Baseline: to be given next visit Goal status: INITIAL   LONG TERM GOALS: Target date: 04/07/2023   Pt will improved FGA >6 point to indicate improved function and reduced fall risk.  Baseline: 24 Goal status: INITIAL  2.  Pt will improve FOTO score to 71 to indicate improved balance, improved function with ADLs and reduced fall risk  Baseline: 68 Goal status: INITIAL  3.  Pt will improve 5xSTS <15 sec without UE SUpport to indicate reduced fall risk and  Baseline: 19.2 Goal status: INITIAL  4.  Pt will improve TUG <13 sec to indicate reduced fall risk and improved community  mobility.  Baseline: 15.69 Goal status: INITIAL  5.  Pt will increase 20min walk test >1279ft to indicate improved mobility in community setting, improved aerobic capacity and reduced fall risk.  Baseline: to be completed  Goal status: INITIAL  ASSESSMENT:  CLINICAL IMPRESSION: Patient is a 47 y.o. with hx of CVA who was seen today for physical therapy evaluation and treatment for balance, strength, and coordination deficits. Examination reveals impaired BLE strength, gait, balance, and increased fall risk as evident with FGA, TUG, 5xSTS. 6MWT to be completed next 1-2 visits for further evaluation of endurance and balance. The pt will benefit from further skilled PT to address these deficits in order to improve functional mobility, QOL and to reduce fall risk.    OBJECTIVE IMPAIRMENTS: Abnormal gait, decreased activity tolerance, decreased balance, decreased coordination, decreased endurance, difficulty walking, decreased strength, and decreased safety awareness.   ACTIVITY LIMITATIONS: lifting, bending, squatting, transfers, and locomotion level  PARTICIPATION LIMITATIONS: cleaning, laundry, and driving  PERSONAL FACTORS: Age, Education, Past/current experiences, and Social background are also affecting patient's functional outcome.   REHAB POTENTIAL: Good  CLINICAL DECISION MAKING: Stable/uncomplicated  EVALUATION COMPLEXITY: Moderate  PLAN:  PT FREQUENCY: 1-2x/week  PT DURATION: 12 weeks  PLANNED INTERVENTIONS: Therapeutic exercises, Therapeutic activity, Neuromuscular re-education, Balance training, Gait training, Patient/Family education, Self Care, Joint mobilization, Stair training, Orthotic/Fit training, and DME instructions  PLAN FOR NEXT SESSION: perform 6 min walk test. Review HEP provided by [place] [facility] and provide updated HEP. Dynamic balance and gait training. Incorporate use of RUE with functional movement.    Lorie Phenix, PT 01/13/2023, 10:46  AM

## 2023-01-14 ENCOUNTER — Telehealth: Payer: Self-pay

## 2023-01-14 NOTE — Telephone Encounter (Signed)
Lm for patient to ask if he has had previous sleep study.  ?

## 2023-01-15 ENCOUNTER — Institutional Professional Consult (permissible substitution): Payer: Medicaid Other | Admitting: Adult Health

## 2023-01-15 NOTE — Telephone Encounter (Signed)
Lm x2 for patient.  Will close encounter per office protocol.   

## 2023-01-20 ENCOUNTER — Ambulatory Visit: Payer: Medicaid Other | Admitting: Physical Therapy

## 2023-01-22 ENCOUNTER — Telehealth: Payer: Self-pay | Admitting: Physical Therapy

## 2023-01-22 ENCOUNTER — Ambulatory Visit: Payer: Medicaid Other | Admitting: Physical Therapy

## 2023-01-22 NOTE — Telephone Encounter (Signed)
Pt has missed last 2 scheduled PT treatments. Called preferred phone number. No Answer. PT left voicemail. Per policy, future PT appointments have been removed. If pt wishes to continue PT treatment, they may call to reschedule therapy sessions as available.   Barrie Folk PT, DPT  Physical Therapist - [facility]  8:42 AM 01/22/23

## 2023-01-22 NOTE — Telephone Encounter (Signed)
Mother left voicemail with front office at [facility], following call from PT earlier in the day. Pt's mother reports that pt would benefit from OT more then PT at this time. This PT reached out to referring provider, Montel Culver, MD for additional OT referral. MD confirmed need for OT treatment and referral. Attempted to call Phone number provided by mother. And Phone number listed in Chart. No Answer. Voicemail left relaying that OT referral has been requested by this PT, and to contact our office with any additional questions.    Barrie Folk PT, DPT  Physical Therapist - [facility]  3:38 PM 01/22/23

## 2023-01-26 ENCOUNTER — Ambulatory Visit: Payer: Medicaid Other | Attending: Family Medicine | Admitting: Physical Therapy

## 2023-01-26 ENCOUNTER — Encounter: Payer: Self-pay | Admitting: Cardiology

## 2023-01-26 ENCOUNTER — Ambulatory Visit: Payer: Medicaid Other | Attending: Cardiology | Admitting: Cardiology

## 2023-01-26 VITALS — BP 118/76 | HR 80 | Ht 75.0 in | Wt 260.0 lb

## 2023-01-26 DIAGNOSIS — R279 Unspecified lack of coordination: Secondary | ICD-10-CM | POA: Insufficient documentation

## 2023-01-26 DIAGNOSIS — F17200 Nicotine dependence, unspecified, uncomplicated: Secondary | ICD-10-CM

## 2023-01-26 DIAGNOSIS — R2681 Unsteadiness on feet: Secondary | ICD-10-CM | POA: Insufficient documentation

## 2023-01-26 DIAGNOSIS — R2689 Other abnormalities of gait and mobility: Secondary | ICD-10-CM | POA: Insufficient documentation

## 2023-01-26 DIAGNOSIS — I2510 Atherosclerotic heart disease of native coronary artery without angina pectoris: Secondary | ICD-10-CM | POA: Diagnosis not present

## 2023-01-26 DIAGNOSIS — I63512 Cerebral infarction due to unspecified occlusion or stenosis of left middle cerebral artery: Secondary | ICD-10-CM | POA: Insufficient documentation

## 2023-01-26 DIAGNOSIS — Z9861 Coronary angioplasty status: Secondary | ICD-10-CM

## 2023-01-26 DIAGNOSIS — R278 Other lack of coordination: Secondary | ICD-10-CM | POA: Insufficient documentation

## 2023-01-26 DIAGNOSIS — I6930 Unspecified sequelae of cerebral infarction: Secondary | ICD-10-CM

## 2023-01-26 DIAGNOSIS — I255 Ischemic cardiomyopathy: Secondary | ICD-10-CM

## 2023-01-26 DIAGNOSIS — I10 Essential (primary) hypertension: Secondary | ICD-10-CM | POA: Diagnosis not present

## 2023-01-26 DIAGNOSIS — M6281 Muscle weakness (generalized): Secondary | ICD-10-CM | POA: Insufficient documentation

## 2023-01-26 MED ORDER — ATORVASTATIN CALCIUM 80 MG PO TABS: 80.0000 mg | ORAL_TABLET | Freq: Every day | ORAL | 2 refills | Status: DC

## 2023-01-26 MED ORDER — DAPAGLIFLOZIN PROPANEDIOL 10 MG PO TABS: 10.0000 mg | ORAL_TABLET | Freq: Every day | ORAL | 2 refills | Status: DC

## 2023-01-26 NOTE — Progress Notes (Signed)
Cardiology Office Note:    Date:  01/26/2023   ID:  Ricardo Yang, DOB 1976-05-30, MRN [id number]  PCP:  Montel Culver, MD   [place] Providers Cardiologist:  Kate Sable, MD     Referring MD: Jeanie Sewer, NP   Chief Complaint  Patient presents with   New Patient (Initial Visit)    New patient evaluation for cardiac history.  Previously seen by Dr. Saralyn Pilar as inpatient.      History of Present Illness:    Ricardo Yang is a 47 y.o. male with a hx of CAD/anterior STEMI s/p PCI with DES to LAD (07/2019), ischemic cardiomyopathy EF less than 20%, current smoker x 25+ years, previous cocaine use, CVA 12/23 resulting in right arm weakness, presenting to establish care.  Previously seen by [facility] cardiology from a cardiac perspective.  Recently establish care with primary care physician, was noted to be dizzy, lisinopril reduced to 2.5 mg daily.  Has felt much better with reduced dose of lisinopril.  Denies leg edema, chest pain or shortness of breath.   left heart cath 2022 patent proximal LAD stent, 50% mid RCA, distal occlusion small OM 2, Echo 12/23 EF less than 20%  Past Medical History:  Diagnosis Date   Acute CVA (cerebrovascular accident) 10/04/2022   Acute ST elevation myocardial infarction (STEMI) involving left anterior descending (LAD) coronary artery 08/06/2019   Anxiety 09/2022   After stroke   CAD (coronary artery disease) 10/04/2022   Cerebrovascular accident (CVA) 10/07/2022   Chest pain    Cocaine abuse    Cocaine use 10/06/2022   Depression December 2023   When stroke happened   Elevated troponin    Encounter for imaging to screen for metal prior to MRI 10/07/2022   Heavy smoker    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 question possible MS   NSTEMI (non-ST elevated myocardial infarction) 05/25/2015   Obesity    Polysubstance abuse    a. tobacco, cocaine, crack, ecstasy, pills, "anything but  injections"   Primary hypertension 10/05/2022   Sleep apnea    Always   STEMI (ST elevation myocardial infarction) 08/06/2019    Past Surgical History:  Procedure Laterality Date   CARDIAC CATHETERIZATION N/A 05/28/2015   Procedure: Left Heart Cath and Coronary Angiography;  Surgeon: Minna Merritts, MD;  Location: [place] [facility];  Service: Cardiovascular;  Laterality: N/A;   CORONARY ANGIOGRAPHY N/A 04/22/2021   Procedure: CORONARY ANGIOGRAPHY;  Surgeon: Isaias Cowman, MD;  Location: [place] [facility];  Service: Cardiovascular;  Laterality: N/A;   CORONARY/GRAFT ACUTE MI REVASCULARIZATION N/A 08/06/2019   Procedure: Coronary/Graft Acute MI Revascularization;  Surgeon: Isaias Cowman, MD;  Location: [place] [facility];  Service: Cardiovascular;  Laterality: N/A;   LEFT HEART CATH N/A 04/22/2021   Procedure: Left Heart Cath;  Surgeon: Isaias Cowman, MD;  Location: [place] [facility];  Service: Cardiovascular;  Laterality: N/A;   LEFT HEART CATH AND CORONARY ANGIOGRAPHY N/A 08/06/2019   Procedure: LEFT HEART CATH AND CORONARY ANGIOGRAPHY;  Surgeon: Isaias Cowman, MD;  Location: [place] [facility];  Service: Cardiovascular;  Laterality: N/A;   TEE WITHOUT CARDIOVERSION N/A 10/08/2022   Procedure: TRANSESOPHAGEAL ECHOCARDIOGRAM (TEE);  Surgeon: Kate Sable, MD;  Location: [facility];  Service: Cardiovascular;  Laterality: N/A;  11:30am    Current Medications: Current Meds  Medication Sig   aspirin 81 MG chewable tablet Chew 1 tablet (81 mg total) by  mouth daily.   Baclofen 5 MG TABS Take 1 tablet (5 mg total) by mouth at bedtime as needed.   clopidogrel (PLAVIX) 75 MG tablet Take 1 tablet (75 mg total) by mouth daily with breakfast.   lisinopril (ZESTRIL) 2.5 MG tablet Take 1 tablet (2.5 mg total) by mouth daily.   metoprolol succinate (TOPROL-XL) 25 MG 24 hr tablet Take 0.5 tablets (12.5 mg total) by mouth daily.     Allergies:   Patient  has no known allergies.   Social History   Socioeconomic History   Marital status: Single    Spouse name: Not on file   Number of children: Not on file   Years of education: Not on file   Highest education level: Not on file  Occupational History   Not on file  Tobacco Use   Smoking status: Every Day    Packs/day: 0.25    Years: 15.00    Additional pack years: 0.00    Total pack years: 3.75    Types: Cigarettes   Smokeless tobacco: Never   Tobacco comments:    Started as a teenager  Vaping Use   Vaping Use: Never used  Substance and Sexual Activity   Alcohol use: Not Currently    Alcohol/week: 1.0 standard drink of alcohol    Types: 1 Shots of liquor per week   Drug use: Yes    Frequency: 1.0 times per week    Types: Cocaine, Marijuana    Comment: 01/26/23 current marijuana no cocaine   Sexual activity: Not Currently    Partners: Female    Birth control/protection: Abstinence, Condom  Other Topics Concern   Not on file  Social History Narrative   Not on file   Social Determinants of Health   Financial Resource Strain: Not on file  Food Insecurity: No Food Insecurity (01/01/2023)   Hunger Vital Sign    Worried About Running Out of Food in the Last Year: Never true    Ran Out of Food in the Last Year: Never true  Transportation Needs: No Transportation Needs (01/01/2023)   PRAPARE - Hydrologist (Medical): No    Lack of Transportation (Non-Medical): No  Physical Activity: Not on file  Stress: Not on file  Social Connections: Not on file     Family History: The patient's family history includes Alcohol abuse in his maternal grandfather; Cancer in his maternal grandfather and maternal grandmother; Depression in his daughter and maternal grandfather; Heart disease in his father; Hypertension in his father, maternal grandfather, and maternal grandmother; 22 / [place] in his mother; Stroke in his maternal grandfather.  ROS:    Please see the history of present illness.     All other systems reviewed and are negative.  EKGs/Labs/Other Studies Reviewed:    The following studies were reviewed today:   EKG:  EKG is  ordered today.  The ekg ordered today demonstrates normal sinus rhythm, left anterior hemiblock.  Recent Labs: 10/10/2022: ALT 22 10/20/2022: BUN 15; Creatinine, Ser 0.97; Hemoglobin 17.0; Platelets 266; Potassium 4.3; Sodium 137  Recent Lipid Panel    Component Value Date/Time   CHOL 155 10/05/2022 0640   TRIG 57 10/05/2022 0640   HDL 36 (L) 10/05/2022 0640   CHOLHDL 4.3 10/05/2022 0640   VLDL 11 10/05/2022 0640   LDLCALC 108 (H) 10/05/2022 0640     Risk Assessment/Calculations:             Physical Exam:  VS:  BP 118/76 (BP Location: Left Arm, Patient Position: Sitting, Cuff Size: Large)   Pulse 80   Ht 6\' 3"  (1.905 m)   Wt 260 lb (117.9 kg)   SpO2 98%   BMI 32.50 kg/m     Wt Readings from Last 3 Encounters:  01/26/23 260 lb (117.9 kg)  01/01/23 260 lb (117.9 kg)  12/04/22 263 lb 4 oz (119.4 kg)     GEN:  Well nourished, well developed in no acute distress HEENT: Normal NECK: No JVD; No carotid bruits CARDIAC: RRR, no murmurs, rubs, gallops RESPIRATORY:  Clear to auscultation without rales, wheezing or rhonchi  ABDOMEN: Soft, non-tender, non-distended MUSCULOSKELETAL:  No edema; right arm weakness SKIN: Warm and dry NEUROLOGIC:  Alert and oriented x 3 PSYCHIATRIC:  Normal affect   ASSESSMENT:    1. Ischemic cardiomyopathy   2. CAD S/P percutaneous coronary angioplasty   3. Smoking   4. History of CVA with residual deficit   5. Essential hypertension    PLAN:    In order of problems listed above:  Ischemic cardiomyopathy EF 20%.  Appears euvolemic.  Continue Toprol-XL 25 mg, lisinopril 2.5 mg daily.  Refill Farxiga 10 mg daily.  Refer to heart failure clinic.  Up titration of GDMT limited by dizziness. CAD s/p PCI to mid LAD 2020.  Denies chest pain.   Continue aspirin, Plavix.  Restart Lipitor 80 mg daily. Current smoker, smoking cessation advised. History of CVA, continue aspirin, Plavix, Lipitor as above. Hypertension, BP controlled.  Toprol-XL, lisinopril.  Follow-up in 6 months     Medication Adjustments/Labs and Tests Ordered: Current medicines are reviewed at length with the patient today.  Concerns regarding medicines are outlined above.  Orders Placed This Encounter  Procedures   AMB referral to CHF clinic   EKG 12-Lead   Meds ordered this encounter  Medications   atorvastatin (LIPITOR) 80 MG tablet    Sig: Take 1 tablet (80 mg total) by mouth daily at 6 PM.    Dispense:  90 tablet    Refill:  2   dapagliflozin propanediol (FARXIGA) 10 MG TABS tablet    Sig: Take 1 tablet (10 mg total) by mouth daily.    Dispense:  90 tablet    Refill:  2    Patient Instructions  Medication Instructions:  Your physician recommends that you continue on your current medications as directed. Please refer to the Current Medication list given to you today.  *If you need a refill on your cardiac medications before your next appointment, please call your pharmacy*   Lab Work:  NONE  If you have labs (blood work) drawn today and your tests are completely normal, you will receive your results only by: [place] (if you have MyChart) OR A paper copy in the mail If you have any lab test that is abnormal or we need to change your treatment, we will call you to review the results.   Testing/Procedures:  NONE   Follow-Up: At [facility], you and your health needs are our priority.  As part of our continuing mission to provide you with exceptional heart care, we have created designated Provider Care Teams.  These Care Teams include your primary Cardiologist (physician) and Advanced Practice Providers (APPs -  Physician Assistants and Nurse Practitioners) who all work together to provide you with the care you need, when  you need it.  We recommend signing up for the patient portal called "MyChart".  Sign up information  is provided on this After Visit Summary.  MyChart is used to connect with patients for Virtual Visits (Telemedicine).  Patients are able to view lab/test results, encounter notes, upcoming appointments, etc.  Non-urgent messages can be sent to your provider as well.   To learn more about what you can do with MyChart, go to [email or web address].    Your next appointment:   6 month(s)  Provider:   You may see or one of the following Advanced Practice Providers on your designated Care Team:   Murray Hodgkins, NP Christell Faith, PA-C Cadence Kathlen Mody, PA-C Gerrie Nordmann, NP  Other Instructions  You have been referred to Heart failure clinic.    Signed, Kate Sable, MD  01/26/2023 12:52 PM    [place]

## 2023-01-26 NOTE — Patient Instructions (Signed)
Medication Instructions:  Your physician recommends that you continue on your current medications as directed. Please refer to the Current Medication list given to you today.  *If you need a refill on your cardiac medications before your next appointment, please call your pharmacy*   Lab Work:  NONE  If you have labs (blood work) drawn today and your tests are completely normal, you will receive your results only by: [facility] (if you have MyChart) OR A paper copy in the mail If you have any lab test that is abnormal or we need to change your treatment, we will call you to review the results.   Testing/Procedures:  NONE   Follow-Up: At [facility], you and your health needs are our priority.  As part of our continuing mission to provide you with exceptional heart care, we have created designated Provider Care Teams.  These Care Teams include your primary Cardiologist (physician) and Advanced Practice Providers (APPs -  Physician Assistants and Nurse Practitioners) who all work together to provide you with the care you need, when you need it.  We recommend signing up for the patient portal called "MyChart".  Sign up information is provided on this After Visit Summary.  MyChart is used to connect with patients for Virtual Visits (Telemedicine).  Patients are able to view lab/test results, encounter notes, upcoming appointments, etc.  Non-urgent messages can be sent to your provider as well.   To learn more about what you can do with MyChart, go to [email or web address].    Your next appointment:   6 month(s)  Provider:   You may see or one of the following Advanced Practice Providers on your designated Care Team:   Murray Hodgkins, NP Christell Faith, PA-C Cadence Kathlen Mody, PA-C Gerrie Nordmann, NP  Other Instructions  You have been referred to Heart failure clinic.

## 2023-01-26 NOTE — Therapy (Signed)
OUTPATIENT PHYSICAL THERAPY NEURO EVALUATION   Patient Name: Ricardo Yang MRN: [id number] DOB:Aug 13, 1976, 47 y.o., male Today's Date: 01/26/2023   PCP: Matthews, Turner PROVIDER: Montel Culver   END OF SESSION:  PT End of Session - 01/26/23 0910     Visit Number 2    Number of Visits 24    Progress Note Due on Visit 10    PT Start Time 0900    PT Stop Time [id number]    PT Time Calculation (min) 47 min    Equipment Utilized During Treatment Gait belt    Activity Tolerance Patient tolerated treatment well    Behavior During Therapy [facility] for tasks assessed/performed             Past Medical History:  Diagnosis Date   Acute CVA (cerebrovascular accident) 10/04/2022   Acute ST elevation myocardial infarction (STEMI) involving left anterior descending (LAD) coronary artery 08/06/2019   Anxiety 09/2022   After stroke   CAD (coronary artery disease) 10/04/2022   Cerebrovascular accident (CVA) 10/07/2022   Chest pain    Cocaine abuse    Cocaine use 10/06/2022   Depression December 2023   When stroke happened   Elevated troponin    Encounter for imaging to screen for metal prior to MRI 10/07/2022   Heavy smoker    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 question possible MS   NSTEMI (non-ST elevated myocardial infarction) 05/25/2015   Obesity    Polysubstance abuse    a. tobacco, cocaine, crack, ecstasy, pills, "anything but injections"   Primary hypertension 10/05/2022   Sleep apnea    Always   STEMI (ST elevation myocardial infarction) 08/06/2019   Past Surgical History:  Procedure Laterality Date   CARDIAC CATHETERIZATION N/A 05/28/2015   Procedure: Left Heart Cath and Coronary Angiography;  Surgeon: Minna Merritts, MD;  Location: [place] [facility];  Service: Cardiovascular;  Laterality: N/A;   CORONARY ANGIOGRAPHY N/A 04/22/2021   Procedure: CORONARY ANGIOGRAPHY;  Surgeon: Isaias Cowman, MD;  Location:  [place] [facility];  Service: Cardiovascular;  Laterality: N/A;   CORONARY/GRAFT ACUTE MI REVASCULARIZATION N/A 08/06/2019   Procedure: Coronary/Graft Acute MI Revascularization;  Surgeon: Isaias Cowman, MD;  Location: [place];  Service: Cardiovascular;  Laterality: N/A;   LEFT HEART CATH N/A 04/22/2021   Procedure: Left Heart Cath;  Surgeon: Isaias Cowman, MD;  Location: [place] [facility];  Service: Cardiovascular;  Laterality: N/A;   LEFT HEART CATH AND CORONARY ANGIOGRAPHY N/A 08/06/2019   Procedure: LEFT HEART CATH AND CORONARY ANGIOGRAPHY;  Surgeon: Isaias Cowman, MD;  Location: [place] [facility];  Service: Cardiovascular;  Laterality: N/A;   TEE WITHOUT CARDIOVERSION N/A 10/08/2022   Procedure: TRANSESOPHAGEAL ECHOCARDIOGRAM (TEE);  Surgeon: Kate Sable, MD;  Location: [facility];  Service: Cardiovascular;  Laterality: N/A;  11:30am   Patient Active Problem List   Diagnosis Date Noted   Sleep apnea 01/01/2023   Dysfunction of right rotator cuff 01/01/2023   History of CVA with residual deficit 12/04/2022   Mixed hyperlipidemia 12/04/2022   History of MI (myocardial infarction) 12/04/2022   Insomnia 10/17/2022   Slow transit constipation 10/17/2022   Chronic pain of both knees 10/17/2022   History of substance abuse 10/15/2022   HFrEF (heart failure with reduced ejection fraction) 10/07/2022   Expressive aphasia 10/05/2022   Right hemiplegia 10/05/2022   CAD S/P percutaneous coronary angioplasty 10/05/2022   Polysubstance abuse  10/04/2022   Essential hypertension 04/22/2021   Ischemic chest pain     ONSET DATE: 09/2022  REFERRING DIAG: CVA of ACA, left MCA and likely of the right PCA territory with a large infarct involving the left postcentral gyrus.   THERAPY DIAG:  Unsteadiness on feet  Balance disorder  Muscle weakness (generalized)  Unspecified lack of coordination  Left middle cerebral artery stroke  Rationale for  Evaluation and Treatment: Rehabilitation  SUBJECTIVE:                                                                                                                                                                                             SUBJECTIVE STATEMENT: Pt reports that he is coming to Rehab to improved balance and strength in the RUE. Reports that he attempted to start therapy in [place], but pt was unable to participate in virtual appointments.  Causing 3 month delay in rehab following CIR. Pt also reports that RUE has regressed since d/c from hospital Pt accompanied by: self  PERTINENT HISTORY: CVA in 12/23. Multiple Cardiac caths over the last 5 years   PAIN:  Are you having pain? Yes: NPRS scale: 3/10 Pain location: R shoulder Pain description: pulling Aggravating factors: movement Relieving factors: heat and pain meds  PRECAUTIONS: Fall  WEIGHT BEARING RESTRICTIONS: No  FALLS: Has patient fallen in last 6 months? No  LIVING ENVIRONMENT: Lives with: lives alone and with room mate Lives in: House/apartment Stairs: Yes: External: 5 steps; on right going up and on left going up Has following equipment at home: None  PLOF: Independent and Independent with basic ADLs  PATIENT GOALS: put on pants with buckle. Improved balance and safety with gait   OBJECTIVE:   DIAGNOSTIC FINDINGS:  EXAM: Right UPPER EXTREMITY VENOUS DOPPLER ULTRASOUND IMPRESSION: No evidence of DVT within the right upper extremity.  EXAM: MRI HEAD WITHOUT CONTRAST IMPRESSION: 1. Acute infarcts in the left ACA, left MCA, and likely the right PCA territory, with a large infarct involving the left postcentral gyrus. Given distribution, these are favored to represent infarcts related to a central embolic etiology. There is a focal region in the right occipital lobe where there is likely a small amount of subarachnoid blood products, which could suggest an underlying component of vasculitis.  Recommend further evaluation with echocardiography. 2. Redemonstrated periventricular and subcortical T2/FLAIR hyperintense signal, as well as at the root entry zone of the right trigeminal nerve, which are suggestive of an underlying demyelinating disease.  COGNITION: Overall cognitive status: Within functional limits for tasks assessed   SENSATION: Light touch: Impaired  RUE   COORDINATION: Ankle to  knee WFL BLE Finger to nose. Dysmetria and hemiplegia on the RUE     MUSCLE TONE: RLE: Within functional limits  MUSCLE LENGTH: Hamstrings: Right WFL ; Left WFL  Thomas test: Right WFL; Left WFL   DTRs:  Not tested  POSTURE: rounded shoulders, forward head, and anterior pelvic tilt  LOWER EXTREMITY ROM:     Active  Right Eval Left Eval  Hip flexion    Hip extension    Hip abduction    Hip adduction    Hip internal rotation    Hip external rotation    Knee flexion    Knee extension    Ankle dorsiflexion    Ankle plantarflexion    Ankle inversion    Ankle eversion     (Blank rows = not tested)  LOWER EXTREMITY MMT:    MMT Right Eval Left Eval  Hip flexion 4- 4-  Hip extension 4 4  Hip abduction 4+ 4+  Hip adduction 5 5  Hip internal rotation    Hip external rotation    Knee flexion 4+ 4+  Knee extension 5 5  Ankle dorsiflexion 4 4-  Ankle plantarflexion 4 4  Ankle inversion    Ankle eversion    (Blank rows = not tested)  BED MOBILITY:  Sit to supine Complete Independence Supine to sit Complete Independence  TRANSFERS: Assistive device utilized: None  Sit to stand: Complete Independence Stand to sit: Complete Independence Chair to chair: Complete Independence Floor: SBA  RAMP:  Level of Assistance: Complete Independence Assistive device utilized: None Ramp Comments: mild foot drop on the LLE   CURB:  Level of Assistance: Complete Independence Assistive device utilizeNoned:  Curb Comments: mild foot drop noted on the LLE    STAIRS: Level of Assistance: Modified independence Stair Negotiation Technique: Alternating Pattern  with No Rails Number of Stairs: 4  Height of Stairs: 6  Comments: foot drop noted on the LLE. But no LOB on this day  GAIT: Gait pattern: decreased arm swing- Right and decreased ankle dorsiflexion- Left Distance walked: 150 Assistive device utilized: None Level of assistance: Complete Independence Comments: foot drop on the LLE. Pt reports that this is a chronic issue  FUNCTIONAL TESTS:  5 times sit to stand: 19.23 with UE support, 19.82 with UE pushing from thighs  Timed up and go (TUG): 15.69 6 minute walk test: 1066 ft 10 meter walk test: 11.6 normal and 9.5 fast  Functional gait assessment: 24  PATIENT SURVEYS:  FOTO 68   TODAY'S TREATMENT:                                                                                                                              DATE: 01/26/2023   6 Min Walk Test:  Instructed patient to ambulate as quickly and as safely as possible for 6 minutes using LRAD. Patient was allowed to take standing rest breaks without stopping the test, but if the patient required  a sitting rest break the clock would be stopped and the test would be over.  Results: 1066 feet (344m) using no AD without assist. Results indicate that the patient has reduced endurance with ambulation compared to age matched norms.  Age Matched Norms: 62-69 yo M: 49 F: 71, 56-79 yo M: 34 F: 82, 15-89 yo M: 417 F: 392 MDC: 58.21 meters (190.98 feet) or 50 meters (ANPTA Core Set of Outcome Measures for Adults with Neurologic Conditions, 2018)    NMR:   Standing at rail.  partial squat with BUE supported 2 x15  Calf raise 2x15  Toe rase 2 x 15  Side step on airex beam  BUE supported on rail  Tandem walk on airex beam RUE supported on rail      PATIENT EDUCATION: Education details: POC. Rehab potential. Goals.   Person educated: Patient Education method:  Explanation Education comprehension: verbalized understanding  HOME EXERCISE PROGRAM: To be given on next appointment.   GOALS: Goals reviewed with patient? Yes  SHORT TERM GOALS: Target date: 02/24/2023   Pt will be independent with HEP  Baseline: to be given next visit Goal status: INITIAL   LONG TERM GOALS: Target date: 04/07/2023   Pt will improved FGA >6 point to indicate improved function and reduced fall risk.  Baseline: 24 Goal status: INITIAL  2.  Pt will improve FOTO score to 71 to indicate improved balance, improved function with ADLs and reduced fall risk  Baseline: 68 Goal status: INITIAL  3.  Pt will improve 5xSTS <15 sec without UE SUpport to indicate reduced fall risk and  Baseline: 19.2 Goal status: INITIAL  4.  Pt will improve TUG <13 sec to indicate reduced fall risk and improved community mobility.  Baseline: 15.69 Goal status: INITIAL  5.  Pt will increase 80min walk test >1216ft to indicate improved mobility in community setting, improved aerobic capacity and reduced fall risk.  Baseline: to be completed  Goal status: INITIAL  ASSESSMENT:  CLINICAL IMPRESSION: Patient is a 47 y.o. with hx of CVA who was seen today for physical therapy evaluation and treatment for balance, strength, and coordination deficits. Examination reveals impaired BLE strength, gait, balance, and increased fall risk as evident with FGA, TUG, 5xSTS. 6MWT to be completed next 1-2 visits for further evaluation of endurance and balance. The pt will benefit from further skilled PT to address these deficits in order to improve functional mobility, QOL and to reduce fall risk.    OBJECTIVE IMPAIRMENTS: Abnormal gait, decreased activity tolerance, decreased balance, decreased coordination, decreased endurance, difficulty walking, decreased strength, and decreased safety awareness.   ACTIVITY LIMITATIONS: lifting, bending, squatting, transfers, and locomotion level  PARTICIPATION  LIMITATIONS: cleaning, laundry, and driving  PERSONAL FACTORS: Age, Education, Past/current experiences, and Social background are also affecting patient's functional outcome.   REHAB POTENTIAL: Good  CLINICAL DECISION MAKING: Stable/uncomplicated  EVALUATION COMPLEXITY: Moderate  PLAN:  PT FREQUENCY: 1-2x/week  PT DURATION: 12 weeks  PLANNED INTERVENTIONS: Therapeutic exercises, Therapeutic activity, Neuromuscular re-education, Balance training, Gait training, Patient/Family education, Self Care, Joint mobilization, Stair training, Orthotic/Fit training, and DME instructions  PLAN FOR NEXT SESSION: perform 6 min walk test. Review HEP provided by [facility] and provide updated HEP. Dynamic balance and gait training. Incorporate use of RUE with functional movement. +   Lorie Phenix, PT 01/26/2023, 12:05 PM

## 2023-01-27 ENCOUNTER — Ambulatory Visit: Payer: Medicaid Other | Admitting: Physical Therapy

## 2023-01-28 ENCOUNTER — Ambulatory Visit: Payer: Medicaid Other

## 2023-01-28 DIAGNOSIS — R2681 Unsteadiness on feet: Secondary | ICD-10-CM | POA: Diagnosis not present

## 2023-01-28 DIAGNOSIS — M6281 Muscle weakness (generalized): Secondary | ICD-10-CM

## 2023-01-28 DIAGNOSIS — I63512 Cerebral infarction due to unspecified occlusion or stenosis of left middle cerebral artery: Secondary | ICD-10-CM

## 2023-01-28 DIAGNOSIS — R278 Other lack of coordination: Secondary | ICD-10-CM

## 2023-01-28 NOTE — Therapy (Signed)
OUTPATIENT OCCUPATIONAL THERAPY NEURO EVALUATION  Patient Name: Ricardo Yang MRN: [id number] DOB:1976-07-04, 47 y.o., male Today's Date: 02/01/2023  PCP: Dr. Joseph Berkshire REFERRING PROVIDER: Dr. Joseph Berkshire  END OF SESSION:  OT End of Session - 02/01/23 1449     Visit Number 1    Number of Visits 24    Date for OT Re-Evaluation 04/22/23    Authorization Time Period Reporting period beginning 01/28/23    Progress Note Due on Visit 10    OT Start Time 1515    OT Stop Time 1600    OT Time Calculation (min) 45 min    Equipment Utilized During Treatment none    Activity Tolerance Patient tolerated treatment well    Behavior During Therapy [facility] for tasks assessed/performed             Past Medical History:  Diagnosis Date   Acute CVA (cerebrovascular accident) 10/04/2022   Acute ST elevation myocardial infarction (STEMI) involving left anterior descending (LAD) coronary artery 08/06/2019   Anxiety 09/2022   After stroke   CAD (coronary artery disease) 10/04/2022   Cerebrovascular accident (CVA) 10/07/2022   Chest pain    Cocaine abuse    Cocaine use 10/06/2022   Depression December 2023   When stroke happened   Elevated troponin    Encounter for imaging to screen for metal prior to MRI 10/07/2022   Heavy smoker    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 question possible MS   NSTEMI (non-ST elevated myocardial infarction) 05/25/2015   Obesity    Polysubstance abuse    a. tobacco, cocaine, crack, ecstasy, pills, "anything but injections"   Primary hypertension 10/05/2022   Sleep apnea    Always   STEMI (ST elevation myocardial infarction) 08/06/2019   Past Surgical History:  Procedure Laterality Date   CARDIAC CATHETERIZATION N/A 05/28/2015   Procedure: Left Heart Cath and Coronary Angiography;  Surgeon: Antonieta Iba, MD;  Location: [facility] INV[facility]IVE CV [facility];  Service: Cardiovascular;  Laterality: N/A;   CORONARY ANGIOGRAPHY N/A  04/22/2021   Procedure: CORONARY ANGIOGRAPHY;  Surgeon: Marcina Millard, MD;  Location: [facility] INV[facility] CV [facility];  Service: Cardiovascular;  Laterality: N/A;   CORONARY/GRAFT ACUTE MI REVASCULARIZATION N/A 08/06/2019   Procedure: Coronary/Graft Acute MI Revascularization;  Surgeon: Marcina Millard, MD;  Location: [facility] CV [facility];  Service: Cardiovascular;  Laterality: N/A;   LEFT HEART CATH N/A 04/22/2021   Procedure: Left Heart Cath;  Surgeon: Marcina Millard, MD;  Location: [facility] INV[facility] CV [facility];  Service: Cardiovascular;  Laterality: N/A;   LEFT HEART CATH AND CORONARY ANGIOGRAPHY N/A 08/06/2019   Procedure: LEFT HEART CATH AND CORONARY ANGIOGRAPHY;  Surgeon: Marcina Millard, MD;  Location: [facility] INV[facility] CV [facility];  Service: Cardiovascular;  Laterality: N/A;   TEE WITHOUT CARDIOVERSION N/A 10/08/2022   Procedure: TRANSESOPHAGEAL ECHOCARDIOGRAM (TEE);  Surgeon: Debbe Odea, MD;  Location: [facility] ORS;  Service: Cardiovascular;  Laterality: N/A;  11:30am   Patient Active Problem List   Diagnosis Date Noted   Sleep apnea 01/01/2023   Dysfunction of right rotator cuff 01/01/2023   History of CVA with residual deficit 12/04/2022   Mixed hyperlipidemia 12/04/2022   History of MI (myocardial infarction) 12/04/2022   Insomnia 10/17/2022   Slow transit constipation 10/17/2022   Chronic pain of both knees 10/17/2022   History of substance abuse 10/15/2022   HFrEF (heart failure with reduced ejection fraction) 10/07/2022   Expressive aphasia 10/05/2022   Right hemiplegia  10/05/2022   CAD S/P percutaneous coronary angioplasty 10/05/2022   Polysubstance abuse 10/04/2022   Essential hypertension 04/22/2021   Ischemic chest pain     ONSET DATE: 10/04/22  REFERRING DIAG: I63.9 (ICD-10-CM) - CVA (cerebral vascular accident)   THERAPY DIAG:  Muscle weakness (generalized)  Other lack of coordination  Left middle cerebral artery stroke  Rationale for Evaluation and  Treatment: Rehabilitation  SUBJECTIVE:   SUBJECTIVE STATEMENT: Pt reports he had more use out of his hand coming out of rehab, but has experienced a decline in the R hand having had a gap between CIR and outpatient OT. Pt accompanied by: self  PERTINENT HISTORY: CVA in 12/23. Multiple Cardiac caths over the last 5 years, hx of polysubstance abuse  PRECAUTIONS: None  WEIGHT BEARING RESTRICTIONS: No  PAIN:  Are you having pain? Yes: NPRS scale: (last night was 9/10 pain) 4/10 Pain location: R shoulder  Pain description: sharp and achy and burning Aggravating factors: lifting the arm Relieving factors: recent cortisone shot within 6 weeks  FALLS: Has patient fallen in last 6 months? 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    PATIENT GOALS: "If I could use my hand 75%.  And to use my hand to use the bathroom."  Pt enjoyed playing cards and shooting pool, playing video games prior to CVA.  OBJECTIVE:   HAND DOMINANCE: Right  ADLs: Overall ADLs: performing ADLs predominantly with the L non-dominant hand Transfers/ambulation related to ADLs: without AD Eating: 1 handed cutting, L hand only Grooming: electric toothbrush, L hand only UB Dressing: assist with buttons and belt  LB Dressing: assist to tie shoes; performs all L handed Toileting: using L hand for toilet hygiene and clothing management Bathing: L hand only Tub Shower transfers: indep with tub shower transfer Equipment: none  IADLs: Shopping: able to manage light shopping trips, but typically goes with a family member for the driving assistance Light  housekeeping: L hand only  Meal Prep: cold meal prep, some hot meal prep when able to use L hand only Community mobility: Pt reports he has not regularly driven since CVA, but has driven a couple of times to the store.  Ambulatory without AD. Medication management: easy open pill bottles and pt uses a pill organizer and sets up independently  Financial management: pt reports he has no current bills Handwriting:  Holds a pen loosely with R hand and can make a mark on paper but not able to write his initials   MOBILITY STATUS:  ambulatory without AD; see PT note for details.  POSTURE COMMENTS:  R sided hemiparesis Sitting balance: Moves/returns truncal midpoint >2 inches in all planes  ACTIVITY TOLERANCE: Activity tolerance: WFL for assessment, to be further assessed in future visits  FUNCTIONAL OUTCOME MEASURES: FOTO: 37 ; predicted 43  UPPER EXTREMITY ROM:    Active ROM Right eval Left Eval WNL  Shoulder flexion 95   Shoulder abduction    Shoulder adduction 68   Shoulder extension    Shoulder internal rotation Thumb to posterior hip   Shoulder external rotation Thumb to R ear with slight  chin tuck and head tilt   Elbow flexion    Elbow extension    Wrist flexion    Wrist extension    Wrist ulnar deviation    Wrist radial deviation    Wrist pronation    Wrist supination    (Blank rows = not tested)  UPPER EXTREMITY MMT:     MMT Right eval Left eval  Shoulder flexion 3- 5  Shoulder abduction    Shoulder adduction 3- 5  Shoulder extension    Shoulder internal rotation 3- 5  Shoulder external rotation 3- 5  Middle trapezius    Lower trapezius    Elbow flexion 3+   Elbow extension 3+   Wrist flexion 4- 5  Wrist extension 4 5  Wrist ulnar deviation    Wrist radial deviation    Wrist pronation 3+ 5  Wrist supination 3- 5  (Blank rows = not tested)  HAND FUNCTION: Grip strength: Right: (3rd level on dynamometer) 4 lbs; Left: 108 lbs, Lateral pinch: Right: 2  lbs, Left: 22 lbs, and 3 point pinch: Right: 0 lbs, Left: 21 lbs (Saehan pinch gauge)  COORDINATION: Finger Nose Finger test: RUE with difficulty 9 Hole Peg test: Right: unable sec; Left: 31.5 sec  SENSATION: Light touch: Impaired  pins and needles down the arm   EDEMA: R hand moderately edematous: 24 cm circumferentially at the MCPs of digits 2-5 (L 22.5 cm)  MUSCLE TONE: RUE: Mild and Hypertonic  COGNITION: Overall cognitive status: Within functional limits for tasks assessed  VISION: Baseline vision: No visual deficits  VISION ASSESSMENT: WFL  PERCEPTION: WFL  PRAXIS: Impaired: Motor planning  TODAY'S TREATMENT:                                                                                                                              DATE: 01/28/23 Evaluation completed.   PATIENT EDUCATION: Education details: OT role, goals, poc Person educated: Patient Education method: Explanation Education comprehension: verbalized understanding  HOME EXERCISE PROGRAM: To be initiated within the next 1-2 sessions  GOALS: Goals reviewed with patient? Yes  SHORT TERM GOALS: Target date: 03/11/23 (6 weeks)  Pt will be indep to perform HEP for improving RUE strength and coordination. Baseline: Eval: not yet initiated Goal status: INITIAL  LONG TERM GOALS: Target date: 04/22/23 (12 weeks)  Pt will increase FOTO score to 43 or better to indicate improvement in self perceived functional use of the R arm with daily tasks. Baseline: Eval: 37 Goal status: INITIAL  2.  Pt will increase R grip strength by 15 or more lbs in order to improve ability to hold and carry light ADL supplies in R dominant hand.    Baseline: Eval: R grip 4 lbs, L 108 lbs (3rd slot on dynamometer)  Goal status: INITIAL  3.  Pt will increase R lateral pinch strength to be able to manipulate playing cards with minimal dropping. Baseline: Eval: R 2 lbs (L 22  lbs); pt has not attempted playing cards since  CVA Goal status: INITIAL  4.  Pt will improve FMC/dexterity skills in R hand to be able to engage the R hand to assist the L with clothing fasteners. Baseline: Eval: R 9 hole (unable), L 31.5 sec; manages clothing fasteners with L hand only. Goal status: INITIAL  5.  Pt will increase RUE strength and coordination to be able to engage the RUE at least 50% of the time with ADLs. Baseline: Eval: manages all ADLs with the L non-dominant hand. Goal status: INITIAL  6.  Pt will sign name for documents with R dominant hand, adapted pen as needed, with >50% legibility. Baseline: Eval: Pt holds standard pen and can make marks on paper but not yet formulate letters of his name. Goal status: INITIAL  ASSESSMENT:  CLINICAL IMPRESSION: Patient is a 47 y.o. male who was seen today for occupational therapy evaluation for deficits related to CVA.  Pt presents with RUE weakness, moderate edema in the R hand, GMC/FMC deficits, decreased sensation to light touch, and mild hypertonicity throughout the RUE.  Pt currently using L non-dominant arm for all ADL tasks, and requires assist from family to manage any 2 handed tasks, including management of clothing fasteners or lifting a heavy pot or other ADL supply.  Pt has had to move back in with his mother post CVA to assist him with daily tasks. Pt reports he had more use of the RUE initially coming out of CIR, but then had several months off before starting outpatient rehab, and notes a significant decline and increased stiffness and swelling in the RUE.  Pt's goal is to maximize use of the RUE for daily tasks.  Pt will benefit from skilled OT to address above noted deficits in order to increase indep with ADLs and improve QOL.  PERFORMANCE DEFICITS: in functional skills including ADLs, IADLs, coordination, dexterity, sensation, edema, tone, ROM, strength, pain, flexibility, Fine motor control, Gross motor control, balance, body mechanics, decreased knowledge of use  of DME, and UE functional use, and psychosocial skills including coping strategies, habits, and routines and behaviors.   IMPAIRMENTS: are limiting patient from ADLs, IADLs, work, leisure, and social participation.   CO-MORBIDITIES: may have co-morbidities  that affects occupational performance. Patient will benefit from skilled OT to address above impairments and improve overall function.  MODIFICATION OR ASSISTANCE TO COMPLETE EVALUATION: No modification of tasks or assist necessary to complete an evaluation.  OT OCCUPATIONAL PROFILE AND HISTORY: Problem focused assessment: Including review of records relating to presenting problem.  CLINICAL DECISION MAKING: Moderate - several treatment options, min-mod task modification necessary  REHAB POTENTIAL: Good  EVALUATION COMPLEXITY: Moderate    PLAN:  OT FREQUENCY: 1-2x/week  OT DURATION: 12 weeks  PLANNED INTERVENTIONS: self care/ADL training, therapeutic exercise, therapeutic activity, neuromuscular re-education, manual therapy, passive range of motion, balance training, splinting, electrical stimulation, paraffin, moist heat, cryotherapy, contrast bath, patient/family education, coping strategies training, and DME and/or AE instructions  RECOMMENDED OTHER SERVICES: None at this time  CONSULTED AND AGREED WITH PLAN OF CARE: Patient  PLAN FOR NEXT SESSION: see above  Danelle Earthly, MS, OTR/L  Otis Dials, OT 02/01/2023, 2:51 PM

## 2023-01-29 ENCOUNTER — Ambulatory Visit: Payer: Medicaid Other | Admitting: Physical Therapy

## 2023-02-02 ENCOUNTER — Other Ambulatory Visit: Payer: Self-pay

## 2023-02-02 ENCOUNTER — Ambulatory Visit: Payer: Medicaid Other

## 2023-02-02 ENCOUNTER — Telehealth: Payer: Self-pay | Admitting: Cardiology

## 2023-02-02 DIAGNOSIS — I10 Essential (primary) hypertension: Secondary | ICD-10-CM

## 2023-02-02 DIAGNOSIS — R2681 Unsteadiness on feet: Secondary | ICD-10-CM | POA: Diagnosis not present

## 2023-02-02 DIAGNOSIS — I2510 Atherosclerotic heart disease of native coronary artery without angina pectoris: Secondary | ICD-10-CM

## 2023-02-02 DIAGNOSIS — R278 Other lack of coordination: Secondary | ICD-10-CM

## 2023-02-02 DIAGNOSIS — I63512 Cerebral infarction due to unspecified occlusion or stenosis of left middle cerebral artery: Secondary | ICD-10-CM

## 2023-02-02 DIAGNOSIS — M6281 Muscle weakness (generalized): Secondary | ICD-10-CM

## 2023-02-02 MED ORDER — CLOPIDOGREL BISULFATE 75 MG PO TABS: 75.0000 mg | ORAL_TABLET | Freq: Every day | ORAL | 3 refills | Status: DC

## 2023-02-02 MED ORDER — METOPROLOL SUCCINATE ER 25 MG PO TB24: 12.5000 mg | ORAL_TABLET | Freq: Every day | ORAL | 0 refills | Status: DC

## 2023-02-02 NOTE — Therapy (Signed)
OUTPATIENT OCCUPATIONAL THERAPY NEURO EVALUATION  Patient Name: Ricardo Yang MRN: [id number] DOB:Aug 28, 1976, 47 y.o., male Today's Date: 02/02/2023  PCP: Dr. Joseph Berkshire REFERRING PROVIDER: Dr. Joseph Berkshire  END OF SESSION:  OT End of Session - 02/02/23 1455     Visit Number 2    Number of Visits 24    Date for OT Re-Evaluation 04/22/23    Authorization Time Period Reporting period beginning 01/28/23    Progress Note Due on Visit 10    OT Start Time 1515    OT Stop Time 1600    OT Time Calculation (min) 45 min    Equipment Utilized During Treatment none    Activity Tolerance Patient tolerated treatment well    Behavior During Therapy [facility] for tasks assessed/performed             Past Medical History:  Diagnosis Date   Acute CVA (cerebrovascular accident) 10/04/2022   Acute ST elevation myocardial infarction (STEMI) involving left anterior descending (LAD) coronary artery 08/06/2019   Anxiety 09/2022   After stroke   CAD (coronary artery disease) 10/04/2022   Cerebrovascular accident (CVA) 10/07/2022   Chest pain    Cocaine abuse    Cocaine use 10/06/2022   Depression December 2023   When stroke happened   Elevated troponin    Encounter for imaging to screen for metal prior to MRI 10/07/2022   Heavy smoker    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 question possible MS   NSTEMI (non-ST elevated myocardial infarction) 05/25/2015   Obesity    Polysubstance abuse    a. tobacco, cocaine, crack, ecstasy, pills, "anything but injections"   Primary hypertension 10/05/2022   Sleep apnea    Always   STEMI (ST elevation myocardial infarction) 08/06/2019   Past Surgical History:  Procedure Laterality Date   CARDIAC CATHETERIZATION N/A 05/28/2015   Procedure: Left Heart Cath and Coronary Angiography;  Surgeon: Antonieta Iba, MD;  Location: [facility] INVASIVE CV LAB;  Service: Cardiovascular;  Laterality: N/A;   CORONARY ANGIOGRAPHY N/A  04/22/2021   Procedure: CORONARY ANGIOGRAPHY;  Surgeon: Marcina Millard, MD;  Location: [facility] INVASIVE CV LAB;  Service: Cardiovascular;  Laterality: N/A;   CORONARY/GRAFT ACUTE MI REVASCULARIZATION N/A 08/06/2019   Procedure: Coronary/Graft Acute MI Revascularization;  Surgeon: Marcina Millard, MD;  Location: [facility] INVASIVE CV LAB;  Service: Cardiovascular;  Laterality: N/A;   LEFT HEART CATH N/A 04/22/2021   Procedure: Left Heart Cath;  Surgeon: Marcina Millard, MD;  Location: [facility] INVASIVE CV LAB;  Service: Cardiovascular;  Laterality: N/A;   LEFT HEART CATH AND CORONARY ANGIOGRAPHY N/A 08/06/2019   Procedure: LEFT HEART CATH AND CORONARY ANGIOGRAPHY;  Surgeon: Marcina Millard, MD;  Location: [facility] INVASIVE CV LAB;  Service: Cardiovascular;  Laterality: N/A;   TEE WITHOUT CARDIOVERSION N/A 10/08/2022   Procedure: TRANSESOPHAGEAL ECHOCARDIOGRAM (TEE);  Surgeon: Debbe Odea, MD;  Location: [facility] ORS;  Service: Cardiovascular;  Laterality: N/A;  11:30am   Patient Active Problem List   Diagnosis Date Noted   Sleep apnea 01/01/2023   Dysfunction of right rotator cuff 01/01/2023   History of CVA with residual deficit 12/04/2022   Mixed hyperlipidemia 12/04/2022   History of MI (myocardial infarction) 12/04/2022   Insomnia 10/17/2022   Slow transit constipation 10/17/2022   Chronic pain of both knees 10/17/2022   History of substance abuse 10/15/2022   HFrEF (heart failure with reduced ejection fraction) 10/07/2022   Expressive aphasia 10/05/2022   Right hemiplegia  10/05/2022   CAD S/P percutaneous coronary angioplasty 10/05/2022   Polysubstance abuse 10/04/2022   Essential hypertension 04/22/2021   Ischemic chest pain     ONSET DATE: 10/04/22  REFERRING DIAG: I63.9 (ICD-10-CM) - CVA (cerebral vascular accident)   THERAPY DIAG:  Muscle weakness (generalized)  Other lack of coordination  Left middle cerebral artery stroke  Rationale for Evaluation and  Treatment: Rehabilitation  SUBJECTIVE:   SUBJECTIVE STATEMENT: Pt reports he had a fall this weekend after his mother moved furniture, reports 7/10 R shoulder pain.  Pt accompanied by: self  PERTINENT HISTORY: CVA in 12/23. Multiple Cardiac caths over the last 5 years, hx of polysubstance abuse  PRECAUTIONS: None  WEIGHT BEARING RESTRICTIONS: No  PAIN:  Are you having pain? Yes: NPRS scale: (last night was 9/10 pain) 4/10 Pain location: R shoulder  Pain description: sharp and achy and burning Aggravating factors: lifting the arm Relieving factors: recent cortisone shot within 6 weeks  FALLS: Has patient fallen in last 6 months? 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    PATIENT GOALS: "If I could use my hand 75%.  And to use my hand to use the bathroom."  Pt enjoyed playing cards and shooting pool, playing video games prior to CVA.  OBJECTIVE:   HAND DOMINANCE: Right  ADLs: Overall ADLs: performing ADLs predominantly with the L non-dominant hand Transfers/ambulation related to ADLs: without AD Eating: 1 handed cutting, L hand only Grooming: electric toothbrush, L hand only UB Dressing: assist with buttons and belt  LB Dressing: assist to tie shoes; performs all L handed Toileting: using L hand for toilet hygiene and clothing management Bathing: L hand only Tub Shower transfers: indep with tub shower transfer Equipment: none  IADLs: Shopping: able to manage light shopping trips, but typically goes with a family member for the driving assistance Light housekeeping: L hand only  Meal Prep: cold meal prep,  some hot meal prep when able to use L hand only Community mobility: Pt reports he has not regularly driven since CVA, but has driven a couple of times to the store.  Ambulatory without AD. Medication management: easy open pill bottles and pt uses a pill organizer and sets up independently  Financial management: pt reports he has no current bills Handwriting:  Holds a pen loosely with R hand and can make a mark on paper but not able to write his initials   MOBILITY STATUS:  ambulatory without AD; see PT note for details.  POSTURE COMMENTS:  R sided hemiparesis Sitting balance: Moves/returns truncal midpoint >2 inches in all planes  ACTIVITY TOLERANCE: Activity tolerance: WFL for assessment, to be further assessed in future visits  FUNCTIONAL OUTCOME MEASURES: FOTO: 37 ; predicted 43  UPPER EXTREMITY ROM:    Active ROM Right eval Left Eval WNL  Shoulder flexion 95   Shoulder abduction    Shoulder adduction 68   Shoulder extension    Shoulder internal rotation Thumb to posterior hip   Shoulder external rotation Thumb to R ear with slight chin tuck and head tilt   Elbow flexion    Elbow  extension    Wrist flexion    Wrist extension    Wrist ulnar deviation    Wrist radial deviation    Wrist pronation    Wrist supination    (Blank rows = not tested)  UPPER EXTREMITY MMT:     MMT Right eval Left eval  Shoulder flexion 3- 5  Shoulder abduction    Shoulder adduction 3- 5  Shoulder extension    Shoulder internal rotation 3- 5  Shoulder external rotation 3- 5  Middle trapezius    Lower trapezius    Elbow flexion 3+   Elbow extension 3+   Wrist flexion 4- 5  Wrist extension 4 5  Wrist ulnar deviation    Wrist radial deviation    Wrist pronation 3+ 5  Wrist supination 3- 5  (Blank rows = not tested)  HAND FUNCTION: Grip strength: Right: (3rd level on dynamometer) 4 lbs; Left: 108 lbs, Lateral pinch: Right: 2 lbs, Left: 22 lbs, and 3 point pinch: Right: 0 lbs, Left:  21 lbs ([place] pinch gauge)  COORDINATION: Finger Nose Finger test: RUE with difficulty 9 Hole Peg test: Right: unable sec; Left: 31.5 sec  SENSATION: Light touch: Impaired  pins and needles down the arm   EDEMA: R hand moderately edematous: 24 cm circumferentially at the MCPs of digits 2-5 (L 22.5 cm)  MUSCLE TONE: RUE: Mild and Hypertonic  COGNITION: Overall cognitive status: Within functional limits for tasks assessed  VISION: Baseline vision: No visual deficits  VISION ASSESSMENT: WFL  PERCEPTION: WFL  PRAXIS: Impaired: Motor planning  TODAY'S TREATMENT:                                                                                                                              DATE: 01/28/23  Therapeutic Exercise: Pt issued soft light blue theraputty for HEP. Reviewed/performed hand strengthening exercises including gross grip, lateral pinch, 3pt. pinch, gross digit extension, thumb opposition, and lumbricals. Cues and visual demonstration for proper technique - handout provided for carryover. Attempted RUE ROM exercises however pt reports 7/10 R shoulder pain following recent fall over the weekend - instructed to perform self ROM stretches within pain free limit for HEP.    Neuromuscular Re-education: Pt worked on reaching for [employer], and moving them through 4 levels of horizontal rungs placed at progressively higher heights. Focus on maintaining grip on mini basketballs and crossing midline to slide balls across each level with pt self-reporting improvement through x2 trials. Pt reaches second level sitting, needs to stand to reach upper 2 levels. Completed isolated finger tapping with active movement greatest in 2nd and 5th digits - plan to add to HEP.   Self Care: Pt educated on adapted tools and positioning for self-feeding provided built up handle for home use, plan to trial with spoon. Improved simulated feeding with R elbow propped on pillow. Agreeable to trial use  of R hand during tooth brushing, requires assist from L hand  for proximal support at elbow/forearm.    PATIENT EDUCATION: Education details: OT role, goals, poc Person educated: Patient Education method: Explanation Education comprehension: verbalized understanding  HOME EXERCISE PROGRAM: To be initiated within the next 1-2 sessions  GOALS: Goals reviewed with patient? Yes  SHORT TERM GOALS: Target date: 03/11/23 (6 weeks)  Pt will be indep to perform HEP for improving RUE strength and coordination. Baseline: Eval: not yet initiated Goal status: INITIAL  LONG TERM GOALS: Target date: 04/22/23 (12 weeks)  Pt will increase FOTO score to 43 or better to indicate improvement in self perceived functional use of the R arm with daily tasks. Baseline: Eval: 37 Goal status: INITIAL  2.  Pt will increase R grip strength by 15 or more lbs in order to improve ability to hold and carry light ADL supplies in R dominant hand.    Baseline: Eval: R grip 4 lbs, L 108 lbs (3rd slot on dynamometer)  Goal status: INITIAL  3.  Pt will increase R lateral pinch strength to be able to manipulate playing cards with minimal dropping. Baseline: Eval: R 2 lbs (L 22 lbs); pt has not attempted playing cards since CVA Goal status: INITIAL  4.  Pt will improve FMC/dexterity skills in R hand to be able to engage the R hand to assist the L with clothing fasteners. Baseline: Eval: R 9 hole (unable), L 31.5 sec; manages clothing fasteners with L hand only. Goal status: INITIAL  5.  Pt will increase RUE strength and coordination to be able to engage the RUE at least 50% of the time with ADLs. Baseline: Eval: manages all ADLs with the L non-dominant hand. Goal status: INITIAL  6.  Pt will sign name for documents with R dominant hand, adapted pen as needed, with >50% legibility. Baseline: Eval: Pt holds standard pen and can make marks on paper but not yet formulate letters of his name. Goal status:  INITIAL  ASSESSMENT:  CLINICAL IMPRESSION: Pt continues to present with RUE weakness, moderate edema in the R hand, GMC/FMC deficits, decreased sensation to light touch, and mild hypertonicity throughout the RUE. Issued soft light blue theraputty with good return demonstration of grip/pinch exercises (handout provided for HEP). Fair tolerance of [place] tower, reaches second level sitting, 3rd and fourth levels standing. Pt reports 7/10 pain at start and end of session. Educated on edema mgmt, issued isotonic glove with instructions to trial night time wearing schedule. Built up handle provided with plan to trial use of R hand for toothbrushing and self-feeding. Pt will benefit from skilled OT to address above noted deficits in order to increase indep with ADLs and improve QOL.  PERFORMANCE DEFICITS: in functional skills including ADLs, IADLs, coordination, dexterity, sensation, edema, tone, ROM, strength, pain, flexibility, Fine motor control, Gross motor control, balance, body mechanics, decreased knowledge of use of DME, and UE functional use, and psychosocial skills including coping strategies, habits, and routines and behaviors.   IMPAIRMENTS: are limiting patient from ADLs, IADLs, work, leisure, and social participation.   CO-MORBIDITIES: may have co-morbidities  that affects occupational performance. Patient will benefit from skilled OT to address above impairments and improve overall function.  MODIFICATION OR ASSISTANCE TO COMPLETE EVALUATION: No modification of tasks or assist necessary to complete an evaluation.  OT OCCUPATIONAL PROFILE AND HISTORY: Problem focused assessment: Including review of records relating to presenting problem.  CLINICAL DECISION MAKING: Moderate - several treatment options, min-mod task modification necessary  REHAB POTENTIAL: Good  EVALUATION COMPLEXITY: Moderate  PLAN:  OT FREQUENCY: 1-2x/week  OT DURATION: 12 weeks  PLANNED INTERVENTIONS: self  care/ADL training, therapeutic exercise, therapeutic activity, neuromuscular re-education, manual therapy, passive range of motion, balance training, splinting, electrical stimulation, paraffin, moist heat, cryotherapy, contrast bath, patient/family education, coping strategies training, and DME and/or AE instructions  RECOMMENDED OTHER SERVICES: None at this time  CONSULTED AND AGREED WITH PLAN OF CARE: Patient  PLAN FOR NEXT SESSION: see above  Kathie Dike, M.S. OTR/L  02/02/23, 2:56 PM  ascom [phone]   Presley Raddle, OT 02/02/2023, 2:56 PM

## 2023-02-02 NOTE — Telephone Encounter (Signed)
*  STAT* If patient is at the pharmacy, call can be transferred to refill team.   1. Which medications need to be refilled? (please list name of each medication and dose if known)  metoprolol succinate (TOPROL-XL) 25 MG 24 hr tablet   clopidogrel (PLAVIX) 75 MG tablet    atorvastatin (LIPITOR) 80 MG tablet     dapagliflozin propanediol (FARXIGA) 10 MG TABS tablet  2. Which pharmacy/location (including street and city if local pharmacy) is medication to be sent to? [id number] - [place]), [place] - [address]   3. Do they need a 30 day or 90 day supply?  90 day supply  Patient's mother states the patient is out of everything except Atorvastatin

## 2023-02-03 ENCOUNTER — Ambulatory Visit: Payer: Medicaid Other | Admitting: Physical Therapy

## 2023-02-03 NOTE — Progress Notes (Unsigned)
   Patient ID: Ricardo Yang, male    DOB: [birth year], 47 y.o.   MRN: [id number]  Primary cardiologist: Debbe Odea, MD (last seen PCP: Jerrol Banana, MD (last seen  HPI  [name] is a 47 y/o male with a history of  TEE 10/08/22: EF 20-25%, no atrial thrombus, mild MR. Echo 10/05/22: EF <20%, mild LAE/RAE and mild MR  LHC 06/22: Mid RCA lesion is 50% stenosed. 2nd Mrg lesion is 100% stenosed. Ost LAD to Prox LAD lesion is 20% stenosed.  1.  Non-ST elevation myocardial infarction 2.  Patent stent proximal LAD 3.  Distal occlusion small to moderate caliber OM 2 4.  Mild to moderate reduced left ventricular function with apical dyskinesis  Was in the ED 11/27/22 due to right hand swelling. Ultrasound negative for DVT. Admitted 10/04/22 due to  acute onset of aphasia and right-sided weakness with leftward gaze. MRI showed acute infarct in the left ACA, left MCA and likely of the right PCA territory with a large infarct involving the left postcentral gyrus. Transferred to rehab.   He presents today for his initial HF visit with a chief complaint of   Review of Systems    Physical Exam     Assessment & Plan:  1: Ischemic heart failure with reduced ejection fraction- - NYHA class  2: HTN- - BP  3: CAD-  4: Stroke-

## 2023-02-04 ENCOUNTER — Other Ambulatory Visit: Payer: Self-pay

## 2023-02-04 ENCOUNTER — Encounter: Payer: Self-pay | Admitting: Family

## 2023-02-04 ENCOUNTER — Ambulatory Visit: Payer: Medicaid Other | Attending: Family | Admitting: Family

## 2023-02-04 VITALS — BP 136/98 | HR 79 | Wt 262.1 lb

## 2023-02-04 DIAGNOSIS — Z955 Presence of coronary angioplasty implant and graft: Secondary | ICD-10-CM | POA: Insufficient documentation

## 2023-02-04 DIAGNOSIS — Z8673 Personal history of transient ischemic attack (TIA), and cerebral infarction without residual deficits: Secondary | ICD-10-CM | POA: Diagnosis not present

## 2023-02-04 DIAGNOSIS — M7989 Other specified soft tissue disorders: Secondary | ICD-10-CM | POA: Insufficient documentation

## 2023-02-04 DIAGNOSIS — I2510 Atherosclerotic heart disease of native coronary artery without angina pectoris: Secondary | ICD-10-CM | POA: Diagnosis not present

## 2023-02-04 DIAGNOSIS — F1911 Other psychoactive substance abuse, in remission: Secondary | ICD-10-CM

## 2023-02-04 DIAGNOSIS — F17210 Nicotine dependence, cigarettes, uncomplicated: Secondary | ICD-10-CM | POA: Diagnosis not present

## 2023-02-04 DIAGNOSIS — R0602 Shortness of breath: Secondary | ICD-10-CM | POA: Insufficient documentation

## 2023-02-04 DIAGNOSIS — I255 Ischemic cardiomyopathy: Secondary | ICD-10-CM | POA: Diagnosis not present

## 2023-02-04 DIAGNOSIS — Z7902 Long term (current) use of antithrombotics/antiplatelets: Secondary | ICD-10-CM | POA: Insufficient documentation

## 2023-02-04 DIAGNOSIS — F32A Depression, unspecified: Secondary | ICD-10-CM | POA: Diagnosis not present

## 2023-02-04 DIAGNOSIS — I5022 Chronic systolic (congestive) heart failure: Secondary | ICD-10-CM | POA: Insufficient documentation

## 2023-02-04 DIAGNOSIS — F419 Anxiety disorder, unspecified: Secondary | ICD-10-CM | POA: Diagnosis not present

## 2023-02-04 DIAGNOSIS — G4730 Sleep apnea, unspecified: Secondary | ICD-10-CM | POA: Diagnosis not present

## 2023-02-04 DIAGNOSIS — Z91148 Patient's other noncompliance with medication regimen for other reason: Secondary | ICD-10-CM | POA: Insufficient documentation

## 2023-02-04 DIAGNOSIS — Z79899 Other long term (current) drug therapy: Secondary | ICD-10-CM | POA: Diagnosis not present

## 2023-02-04 DIAGNOSIS — G35 Multiple sclerosis: Secondary | ICD-10-CM | POA: Insufficient documentation

## 2023-02-04 DIAGNOSIS — M25511 Pain in right shoulder: Secondary | ICD-10-CM | POA: Diagnosis not present

## 2023-02-04 DIAGNOSIS — Z9861 Coronary angioplasty status: Secondary | ICD-10-CM

## 2023-02-04 DIAGNOSIS — I340 Nonrheumatic mitral (valve) insufficiency: Secondary | ICD-10-CM | POA: Insufficient documentation

## 2023-02-04 DIAGNOSIS — I110 Hypertensive heart disease with heart failure: Secondary | ICD-10-CM | POA: Insufficient documentation

## 2023-02-04 DIAGNOSIS — Z7982 Long term (current) use of aspirin: Secondary | ICD-10-CM | POA: Diagnosis not present

## 2023-02-04 DIAGNOSIS — I252 Old myocardial infarction: Secondary | ICD-10-CM | POA: Diagnosis not present

## 2023-02-04 DIAGNOSIS — I10 Essential (primary) hypertension: Secondary | ICD-10-CM | POA: Diagnosis not present

## 2023-02-04 DIAGNOSIS — Z7984 Long term (current) use of oral hypoglycemic drugs: Secondary | ICD-10-CM | POA: Insufficient documentation

## 2023-02-04 DIAGNOSIS — I6930 Unspecified sequelae of cerebral infarction: Secondary | ICD-10-CM

## 2023-02-04 MED ORDER — METOPROLOL SUCCINATE ER 25 MG PO TB24
12.5000 mg | ORAL_TABLET | Freq: Every day | ORAL | 5 refills | Status: DC
Start: 2023-02-04 — End: 2023-03-12
  Filled 2023-02-04 – 2023-03-03 (×2): qty 15, 30d supply, fill #0

## 2023-02-04 MED ORDER — ATORVASTATIN CALCIUM 80 MG PO TABS
80.0000 mg | ORAL_TABLET | Freq: Every day | ORAL | 5 refills | Status: DC
  Filled 2023-02-04 – 2023-05-12 (×3): qty 30, 30d supply, fill #0
  Filled 2023-06-25 (×2): qty 30, 30d supply, fill #1
  Filled 2023-08-03: qty 30, 30d supply, fill #2
  Filled 2023-10-07: qty 30, 30d supply, fill #3
  Filled 2023-11-03: qty 30, 30d supply, fill #4

## 2023-02-04 MED ORDER — CLOPIDOGREL BISULFATE 75 MG PO TABS
75.0000 mg | ORAL_TABLET | Freq: Every day | ORAL | 5 refills | Status: DC
  Filled 2023-02-04 – 2023-05-01 (×3): qty 30, 30d supply, fill #0
  Filled 2023-06-25 (×2): qty 30, 30d supply, fill #1
  Filled 2023-08-03: qty 30, 30d supply, fill #2
  Filled 2023-10-07: qty 30, 30d supply, fill #3
  Filled 2023-11-03: qty 30, 30d supply, fill #4

## 2023-02-04 MED ORDER — ASPIRIN 81 MG PO CHEW
81.0000 mg | CHEWABLE_TABLET | Freq: Every day | ORAL | 5 refills | Status: AC
  Filled 2023-02-04: qty 30, 30d supply, fill #0

## 2023-02-04 MED ORDER — DAPAGLIFLOZIN PROPANEDIOL 10 MG PO TABS
10.0000 mg | ORAL_TABLET | Freq: Every day | ORAL | 5 refills | Status: DC
Start: 2023-02-04 — End: 2023-11-03
  Filled 2023-02-04 – 2023-03-03 (×2): qty 30, 30d supply, fill #0
  Filled 2023-04-02: qty 30, 30d supply, fill #1
  Filled 2023-05-01: qty 30, 30d supply, fill #2
  Filled 2023-06-25 (×2): qty 30, 30d supply, fill #3
  Filled 2023-08-03: qty 30, 30d supply, fill #4
  Filled 2023-10-07: qty 30, 30d supply, fill #5

## 2023-02-04 MED ORDER — LISINOPRIL 2.5 MG PO TABS
2.5000 mg | ORAL_TABLET | Freq: Every day | ORAL | 5 refills | Status: DC
Start: 2023-02-04 — End: 2023-04-20
  Filled 2023-02-04: qty 30, 30d supply, fill #0
  Filled 2023-03-03: qty 30, 30d supply, fill #1

## 2023-02-04 NOTE — Patient Instructions (Addendum)
Begin weighing daily and call for an overnight weight gain of 3 pounds or more or a weekly weight gain of more than 5 pounds.   Go to the [employer] in the [facility]

## 2023-02-05 ENCOUNTER — Telehealth (HOSPITAL_COMMUNITY): Payer: Self-pay

## 2023-02-05 ENCOUNTER — Ambulatory Visit: Payer: Medicaid Other | Admitting: Physical Therapy

## 2023-02-05 ENCOUNTER — Other Ambulatory Visit (HOSPITAL_COMMUNITY): Payer: Self-pay

## 2023-02-05 NOTE — Telephone Encounter (Signed)
Patient Advocate Encounter  Prior authorization for [place] submitted and APPROVED. Test billing returns $4 copay for 90 day supply.  Key [id number] Effective: 02/05/2023 - 02/05/2024  Burnell Blanks, CPhT Rx Patient Advocate Phone: [phone]

## 2023-02-09 ENCOUNTER — Ambulatory Visit: Payer: Medicaid Other

## 2023-02-09 DIAGNOSIS — R278 Other lack of coordination: Secondary | ICD-10-CM

## 2023-02-09 DIAGNOSIS — M6281 Muscle weakness (generalized): Secondary | ICD-10-CM

## 2023-02-09 DIAGNOSIS — R2681 Unsteadiness on feet: Secondary | ICD-10-CM | POA: Diagnosis not present

## 2023-02-09 DIAGNOSIS — I63512 Cerebral infarction due to unspecified occlusion or stenosis of left middle cerebral artery: Secondary | ICD-10-CM

## 2023-02-10 ENCOUNTER — Ambulatory Visit: Payer: Medicaid Other | Admitting: Physical Therapy

## 2023-02-10 ENCOUNTER — Other Ambulatory Visit: Payer: Self-pay

## 2023-02-10 NOTE — Therapy (Signed)
OUTPATIENT OCCUPATIONAL THERAPY NEURO TREATMENT NOTE  Patient Name: Ricardo Yang MRN: [id number] DOB:07-13-1976, 47 y.o., male Today's Date: 02/10/2023  PCP: Dr. Joseph Berkshire REFERRING PROVIDER: Dr. Joseph Berkshire  END OF SESSION:  OT End of Session - 02/10/23 1219     Visit Number 3    Number of Visits 24    Date for OT Re-Evaluation 04/22/23    Authorization Time Period Reporting period beginning 01/28/23    Progress Note Due on Visit 10    OT Start Time 1600    OT Stop Time 1645    OT Time Calculation (min) 45 min    Equipment Utilized During Treatment none    Activity Tolerance Patient tolerated treatment well    Behavior During Therapy [facility] for tasks assessed/performed             Past Medical History:  Diagnosis Date   Acute CVA (cerebrovascular accident) 10/04/2022   Acute ST elevation myocardial infarction (STEMI) involving left anterior descending (LAD) coronary artery 08/06/2019   Anxiety 09/2022   After stroke   CAD (coronary artery disease) 10/04/2022   Cerebrovascular accident (CVA) 10/07/2022   Chest pain    CHF (congestive heart failure)    Cocaine abuse    Cocaine use 10/06/2022   Depression December 2023   When stroke happened   Elevated troponin    Encounter for imaging to screen for metal prior to MRI 10/07/2022   Heavy smoker    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 question possible MS   NSTEMI (non-ST elevated myocardial infarction) 05/25/2015   Obesity    Polysubstance abuse    a. tobacco, cocaine, crack, ecstasy, pills, "anything but injections"   Primary hypertension 10/05/2022   Sleep apnea    Always   STEMI (ST elevation myocardial infarction) 08/06/2019   Past Surgical History:  Procedure Laterality Date   CARDIAC CATHETERIZATION N/A 05/28/2015   Procedure: Left Heart Cath and Coronary Angiography;  Surgeon: Antonieta Iba, MD;  Location: [facility] IN[facility]IVE CV [facility];  Service: Cardiovascular;   Laterality: N/A;   CORONARY ANGIOGRAPHY N/A 04/22/2021   Procedure: CORONARY ANGIOGRAPHY;  Surgeon: Marcina Millard, MD;  Location: [facility] IN[facility] CV [facility];  Service: Cardiovascular;  Laterality: N/A;   CORONARY/GRAFT ACUTE MI REVASCULARIZATION N/A 08/06/2019   Procedure: Coronary/Graft Acute MI Revascularization;  Surgeon: Marcina Millard, MD;  Location: [facility];  Service: Cardiovascular;  Laterality: N/A;   LEFT HEART CATH N/A 04/22/2021   Procedure: Left Heart Cath;  Surgeon: Marcina Millard, MD;  Location: [facility] CV [facility];  Service: Cardiovascular;  Laterality: N/A;   LEFT HEART CATH AND CORONARY ANGIOGRAPHY N/A 08/06/2019   Procedure: LEFT HEART CATH AND CORONARY ANGIOGRAPHY;  Surgeon: Marcina Millard, MD;  Location: [facility] IN[facility] CV [facility];  Service: Cardiovascular;  Laterality: N/A;   TEE WITHOUT CARDIOVERSION N/A 10/08/2022   Procedure: TRANSESOPHAGEAL ECHOCARDIOGRAM (TEE);  Surgeon: Debbe Odea, MD;  Location: [facility] OR[facility];  Service: Cardiovascular;  Laterality: N/A;  11:30am   Patient Active Problem List   Diagnosis Date Noted   Sleep apnea 01/01/2023   Dysfunction of right rotator cuff 01/01/2023   History of CVA with residual deficit 12/04/2022   Mixed hyperlipidemia 12/04/2022   History of MI (myocardial infarction) 12/04/2022   Insomnia 10/17/2022   Slow transit constipation 10/17/2022   Chronic pain of both knees 10/17/2022   History of substance abuse 10/15/2022   HFrEF (heart failure with reduced ejection fraction) 10/07/2022  Expressive aphasia 10/05/2022   Right hemiplegia 10/05/2022   CAD S/P percutaneous coronary angioplasty 10/05/2022   Polysubstance abuse 10/04/2022   Essential hypertension 04/22/2021   Ischemic chest pain     ONSET DATE: 10/04/22  REFERRING DIAG: I63.9 (ICD-10-CM) - CVA (cerebral vascular accident)   THERAPY DIAG:  Muscle weakness (generalized)  Other lack of coordination  Left middle cerebral artery  stroke  Rationale for Evaluation and Treatment: Rehabilitation  SUBJECTIVE:  SUBJECTIVE STATEMENT: Pt reporting R shoulder is really bothering him today. Pt accompanied by: self  PERTINENT HISTORY: CVA in 12/23. Multiple Cardiac caths over the last 5 years, hx of polysubstance abuse  PRECAUTIONS: None  WEIGHT BEARING RESTRICTIONS: No  PAIN:  Are you having pain? Yes: NPRS scale: 7/10 beginning of session, end of session down to 3/10 Pain location: R shoulder  Pain description: sharp and achy and burning Aggravating factors: lifting the arm Relieving factors: recent cortisone shot within 6 weeks  FALLS: Has patient fallen in last 6 months? 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    PATIENT GOALS: "If I could use my hand 75%.  And to use my hand to use the bathroom."  Pt enjoyed playing cards and shooting pool, playing video games prior to CVA.  OBJECTIVE:   HAND DOMINANCE: Right  ADLs: Overall ADLs: performing ADLs predominantly with the L non-dominant hand Transfers/ambulation related to ADLs: without AD Eating: 1 handed cutting, L hand only Grooming: electric toothbrush, L hand only UB Dressing: assist with buttons and belt  LB Dressing: assist to tie shoes; performs all L handed Toileting: using L hand for toilet hygiene and clothing management Bathing: L hand only Tub Shower transfers: indep with tub shower transfer Equipment: none  IADLs: Shopping: able to manage light shopping trips, but typically goes with a family member for the driving assistance Light housekeeping: L hand only  Meal Prep: cold  meal prep, some hot meal prep when able to use L hand only Community mobility: Pt reports he has not regularly driven since CVA, but has driven a couple of times to the store.  Ambulatory without AD. Medication management: easy open pill bottles and pt uses a pill organizer and sets up independently  Financial management: pt reports he has no current bills Handwriting:  Holds a pen loosely with R hand and can make a mark on paper but not able to write his initials   MOBILITY STATUS:  ambulatory without AD; see PT note for details.  POSTURE COMMENTS:  R sided hemiparesis Sitting balance: Moves/returns truncal midpoint >2 inches in all planes  ACTIVITY TOLERANCE: Activity tolerance: WFL for assessment, to be further assessed in future visits  FUNCTIONAL OUTCOME MEASURES: FOTO: 37 ; predicted 43  UPPER EXTREMITY ROM:    Active ROM Right eval Left Eval WNL  Shoulder flexion 95   Shoulder abduction    Shoulder adduction 68   Shoulder extension    Shoulder internal rotation Thumb to posterior hip   Shoulder external rotation Thumb to R ear with slight chin tuck and head tilt   Elbow flexion    Elbow  extension    Wrist flexion    Wrist extension    Wrist ulnar deviation    Wrist radial deviation    Wrist pronation    Wrist supination    (Blank rows = not tested)  UPPER EXTREMITY MMT:     MMT Right eval Left eval  Shoulder flexion 3- 5  Shoulder abduction    Shoulder adduction 3- 5  Shoulder extension    Shoulder internal rotation 3- 5  Shoulder external rotation 3- 5  Middle trapezius    Lower trapezius    Elbow flexion 3+   Elbow extension 3+   Wrist flexion 4- 5  Wrist extension 4 5  Wrist ulnar deviation    Wrist radial deviation    Wrist pronation 3+ 5  Wrist supination 3- 5  (Blank rows = not tested)  HAND FUNCTION: Grip strength: Right: (3rd level on dynamometer) 4 lbs; Left: 108 lbs, Lateral pinch: Right: 2 lbs, Left: 22 lbs, and 3 point pinch: Right: 0  lbs, Left: 21 lbs (S[place]han pinch gauge)  COORDINATION: Finger Nose Finger test: RUE with difficulty 9 Hole Peg test: Right: unable sec; Left: 31.5 sec  SENSATION: Light touch: Impaired  pins and needles down the arm   EDEMA: R hand moderately edematous: 24 cm circumferentially at the MCPs of digits 2-5 (L 22.5 cm)  MUSCLE TONE: RUE: Mild and Hypertonic  COGNITION: Overall cognitive status: Within functional limits for tasks assessed  VISION: Baseline vision: No visual deficits  VISION ASSESSMENT: WFL  PERCEPTION: WFL  PRAXIS: Impaired: Motor planning  TODAY'S TREATMENT:                                                                                                                              DATE: 02/09/23 Moist heat applied to R shoulder x 5 min for pain reduction/muscle relaxation in prep for therapeutic exercises.  Moist heat left on intermittently throughout session with simultaneous completion of RUE exercises.  Therapeutic Exercise: In supine, performed passive R scapular depression and retraction, and passive R shoulder flex, abd, ER/IR, horiz abd/add within pain free range.  Instructed pt in and performed reps of cane stretches in supine for bilat chest press, shoulder flex, horiz abd/add, ER, and abd.  Issued written handout and reviewed with pt.  Encouraged pt complete daily within pain free range.  Reviewed/completed active bilat shoulder retraction stretch in sitting.    PATIENT EDUCATION: Education details: RUE positioning in supine and side lying to support R shoulder Person educated: Patient Education method: Explanation, demo Education comprehension: verbalized understanding, demonstrated understanding  HOME EXERCISE PROGRAM: Theraputty exercises, cane stretches  GOALS: Goals reviewed with patient? Yes  SHORT TERM GOALS: Target date: 03/11/23 (6 weeks)  Pt will be indep to perform HEP for improving RUE strength and coordination. Baseline: Eval: not yet  initiated Goal status: INITIAL  LONG TERM GOALS: Target date: 04/22/23 (12 weeks)  Pt will increase FOTO score to 43 or  better to indicate improvement in self perceived functional use of the R arm with daily tasks. Baseline: Eval: 37 Goal status: INITIAL  2.  Pt will increase R grip strength by 15 or more lbs in order to improve ability to hold and carry light ADL supplies in R dominant hand.    Baseline: Eval: R grip 4 lbs, L 108 lbs (3rd slot on dynamometer)  Goal status: INITIAL  3.  Pt will increase R lateral pinch strength to be able to manipulate playing cards with minimal dropping. Baseline: Eval: R 2 lbs (L 22 lbs); pt has not attempted playing cards since CVA Goal status: INITIAL  4.  Pt will improve FMC/dexterity skills in R hand to be able to engage the R hand to assist the L with clothing fasteners. Baseline: Eval: R 9 hole (unable), L 31.5 sec; manages clothing fasteners with L hand only. Goal status: INITIAL  5.  Pt will increase RUE strength and coordination to be able to engage the RUE at least 50% of the time with ADLs. Baseline: Eval: manages all ADLs with the L non-dominant hand. Goal status: INITIAL  6.  Pt will sign name for documents with R dominant hand, adapted pen as needed, with >50% legibility. Baseline: Eval: Pt holds standard pen and can make marks on paper but not yet formulate letters of his name. Goal status: INITIAL  ASSESSMENT:  CLINICAL IMPRESSION: Pt reporting R shoulder pain at 7/10 pain at start of session.  Pt responded well to passive stretching and gentle cane exercises in supine for reducing RUE stiffness.  By end of session, pt reported 3/10 pain at the R shoulder.  Pt was provided with handout for carry over of cane stretches at home.  Reviewed positioning strategies to support RUE in supine and side lying d/t pt reporting discomfort in the R shoulder at night, as well as encouragement to elevate R hand d/t edema.  Pt able to return demo.  Pt  continues to present with RUE weakness, moderate edema in the R hand, GMC/FMC deficits, decreased sensation to light touch, and mild hypertonicity throughout the RUE.  Pt will continue to benefit from skilled OT to address above noted deficits in order to increase indep with ADLs and improve QOL.  PERFORMANCE DEFICITS: in functional skills including ADLs, IADLs, coordination, dexterity, sensation, edema, tone, ROM, strength, pain, flexibility, Fine motor control, Gross motor control, balance, body mechanics, decreased knowledge of use of DME, and UE functional use, and psychosocial skills including coping strategies, habits, and routines and behaviors.   IMPAIRMENTS: are limiting patient from ADLs, IADLs, work, leisure, and social participation.   CO-MORBIDITIES: may have co-morbidities  that affects occupational performance. Patient will benefit from skilled OT to address above impairments and improve overall function.  MODIFICATION OR ASSISTANCE TO COMPLETE EVALUATION: No modification of tasks or assist necessary to complete an evaluation.  OT OCCUPATIONAL PROFILE AND HISTORY: Problem focused assessment: Including review of records relating to presenting problem.  CLINICAL DECISION MAKING: Moderate - several treatment options, min-mod task modification necessary  REHAB POTENTIAL: Good  EVALUATION COMPLEXITY: Moderate    PLAN:  OT FREQUENCY: 1-2x/week  OT DURATION: 12 weeks  PLANNED INTERVENTIONS: self care/ADL training, therapeutic exercise, therapeutic activity, neuromuscular re-education, manual therapy, passive range of motion, balance training, splinting, electrical stimulation, paraffin, moist heat, cryotherapy, contrast bath, patient/family education, coping strategies training, and DME and/or AE instructions  RECOMMENDED OTHER SERVICES: None at this time  CONSULTED AND AGREED WITH PLAN OF CARE: Patient  PLAN FOR NEXT SESSION: see above  Danelle Earthly, MS, OTR/L  Otis Dials, OT 02/10/2023, 12:21 PM

## 2023-02-12 ENCOUNTER — Ambulatory Visit: Payer: Medicaid Other

## 2023-02-12 ENCOUNTER — Ambulatory Visit: Payer: Medicaid Other | Admitting: Physical Therapy

## 2023-02-12 DIAGNOSIS — R278 Other lack of coordination: Secondary | ICD-10-CM

## 2023-02-12 DIAGNOSIS — R2681 Unsteadiness on feet: Secondary | ICD-10-CM | POA: Diagnosis not present

## 2023-02-12 DIAGNOSIS — I63512 Cerebral infarction due to unspecified occlusion or stenosis of left middle cerebral artery: Secondary | ICD-10-CM

## 2023-02-12 DIAGNOSIS — M6281 Muscle weakness (generalized): Secondary | ICD-10-CM

## 2023-02-14 NOTE — Therapy (Signed)
OUTPATIENT OCCUPATIONAL THERAPY NEURO TREATMENT NOTE  Patient Name: Ricardo Yang MRN: [id number] DOB:1976-02-13, 47 y.o., male Today's Date: 02/14/2023  PCP: Dr. Joseph Berkshire REFERRING PROVIDER: Dr. Joseph Berkshire  END OF SESSION:  OT End of Session - 02/14/23 1240     Visit Number 4    Number of Visits 24    Date for OT Re-Evaluation 04/22/23    Authorization Time Period Reporting period beginning 01/28/23    Progress Note Due on Visit 10    OT Start Time 1430    OT Stop Time 1515    OT Time Calculation (min) 45 min    Equipment Utilized During Treatment none    Activity Tolerance Patient tolerated treatment well    Behavior During Therapy [facility] for tasks assessed/performed             Past Medical History:  Diagnosis Date   Acute CVA (cerebrovascular accident) 10/04/2022   Acute ST elevation myocardial infarction (STEMI) involving left anterior descending (LAD) coronary artery 08/06/2019   Anxiety 09/2022   After stroke   CAD (coronary artery disease) 10/04/2022   Cerebrovascular accident (CVA) 10/07/2022   Chest pain    CHF (congestive heart failure)    Cocaine abuse    Cocaine use 10/06/2022   Depression December 2023   When stroke happened   Elevated troponin    Encounter for imaging to screen for metal prior to MRI 10/07/2022   Heavy smoker    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 question possible MS   NSTEMI (non-ST elevated myocardial infarction) 05/25/2015   Obesity    Polysubstance abuse    a. tobacco, cocaine, crack, ecstasy, pills, "anything but injections"   Primary hypertension 10/05/2022   Sleep apnea    Always   STEMI (ST elevation myocardial infarction) 08/06/2019   Past Surgical History:  Procedure Laterality Date   CARDIAC CATHETERIZATION N/A 05/28/2015   Procedure: Left Heart Cath and Coronary Angiography;  Surgeon: Antonieta Iba, MD;  Location: [facility] INVASIVE CV LAB;  Service: Cardiovascular;   Laterality: N/A;   CORONARY ANGIOGRAPHY N/A 04/22/2021   Procedure: CORONARY ANGIOGRAPHY;  Surgeon: Marcina Millard, MD;  Location: [facility] INVASIVE CV LAB;  Service: Cardiovascular;  Laterality: N/A;   CORONARY/GRAFT ACUTE MI REVASCULARIZATION N/A 08/06/2019   Procedure: Coronary/Graft Acute MI Revascularization;  Surgeon: Marcina Millard, MD;  Location: [facility] INVASIVE CV [facility];  Service: Cardiovascular;  Laterality: N/A;   LEFT HEART CATH N/A 04/22/2021   Procedure: Left Heart Cath;  Surgeon: Marcina Millard, MD;  Location: [facility] INVASIVE CV LAB;  Service: Cardiovascular;  Laterality: N/A;   LEFT HEART CATH AND CORONARY ANGIOGRAPHY N/A 08/06/2019   Procedure: LEFT HEART CATH AND CORONARY ANGIOGRAPHY;  Surgeon: Marcina Millard, MD;  Location: [facility] INVASIVE CV LAB;  Service: Cardiovascular;  Laterality: N/A;   TEE WITHOUT CARDIOVERSION N/A 10/08/2022   Procedure: TRANSESOPHAGEAL ECHOCARDIOGRAM (TEE);  Surgeon: Debbe Odea, MD;  Location: [facility] ORS;  Service: Cardiovascular;  Laterality: N/A;  11:30am   Patient Active Problem List   Diagnosis Date Noted   Sleep apnea 01/01/2023   Dysfunction of right rotator cuff 01/01/2023   History of CVA with residual deficit 12/04/2022   Mixed hyperlipidemia 12/04/2022   History of MI (myocardial infarction) 12/04/2022   Insomnia 10/17/2022   Slow transit constipation 10/17/2022   Chronic pain of both knees 10/17/2022   History of substance abuse 10/15/2022   HFrEF (heart failure with reduced ejection fraction) 10/07/2022  Expressive aphasia 10/05/2022   Right hemiplegia 10/05/2022   CAD S/P percutaneous coronary angioplasty 10/05/2022   Polysubstance abuse 10/04/2022   Essential hypertension 04/22/2021   Ischemic chest pain     ONSET DATE: 10/04/22  REFERRING DIAG: I63.9 (ICD-10-CM) - CVA (cerebral vascular accident)   THERAPY DIAG:  Muscle weakness (generalized)  Other lack of coordination  Left middle cerebral artery  stroke  Rationale for Evaluation and Treatment: Rehabilitation  SUBJECTIVE:  SUBJECTIVE STATEMENT: Pt reporting R hand is a lot less swollen. (Visibly improved) Pt accompanied by: self  PERTINENT HISTORY: CVA in 12/23. Multiple Cardiac caths over the last 5 years, hx of polysubstance abuse  PRECAUTIONS: None  WEIGHT BEARING RESTRICTIONS: No  PAIN:  Are you having pain? Yes: NPRS scale: 5/10 beginning of session, end of session down to 2/10 Pain location: R shoulder  Pain description: sharp and achy and burning Aggravating factors: lifting the arm Relieving factors: recent cortisone shot within 6 weeks  FALLS: Has patient fallen in last 6 months? 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    PATIENT GOALS: "If I could use my hand 75%.  And to use my hand to use the bathroom."  Pt enjoyed playing cards and shooting pool, playing video games prior to CVA.  OBJECTIVE:   HAND DOMINANCE: Right  ADLs: Overall ADLs: performing ADLs predominantly with the L non-dominant hand Transfers/ambulation related to ADLs: without AD Eating: 1 handed cutting, L hand only Grooming: electric toothbrush, L hand only UB Dressing: assist with buttons and belt  LB Dressing: assist to tie shoes; performs all L handed Toileting: using L hand for toilet hygiene and clothing management Bathing: L hand only Tub Shower transfers: indep with tub shower transfer Equipment: none  IADLs: Shopping: able to manage light shopping trips, but typically goes with a family member for the driving assistance Light housekeeping: L hand only  Meal  Prep: cold meal prep, some hot meal prep when able to use L hand only Community mobility: Pt reports he has not regularly driven since CVA, but has driven a couple of times to the store.  Ambulatory without AD. Medication management: easy open pill bottles and pt uses a pill organizer and sets up independently  Financial management: pt reports he has no current bills Handwriting:  Holds a pen loosely with R hand and can make a mark on paper but not able to write his initials   MOBILITY STATUS:  ambulatory without AD; see PT note for details.  POSTURE COMMENTS:  R sided hemiparesis Sitting balance: Moves/returns truncal midpoint >2 inches in all planes  ACTIVITY TOLERANCE: Activity tolerance: WFL for assessment, to be further assessed in future visits  FUNCTIONAL OUTCOME MEASURES: FOTO: 37 ; predicted 43  UPPER EXTREMITY ROM:    Active ROM Right eval Left Eval WNL  Shoulder flexion 95   Shoulder abduction    Shoulder adduction 68   Shoulder extension    Shoulder internal rotation Thumb to posterior hip   Shoulder external rotation Thumb to R ear with slight chin tuck and head tilt   Elbow flexion  Elbow extension    Wrist flexion    Wrist extension    Wrist ulnar deviation    Wrist radial deviation    Wrist pronation    Wrist supination    (Blank rows = not tested)  UPPER EXTREMITY MMT:     MMT Right eval Left eval  Shoulder flexion 3- 5  Shoulder abduction    Shoulder adduction 3- 5  Shoulder extension    Shoulder internal rotation 3- 5  Shoulder external rotation 3- 5  Middle trapezius    Lower trapezius    Elbow flexion 3+   Elbow extension 3+   Wrist flexion 4- 5  Wrist extension 4 5  Wrist ulnar deviation    Wrist radial deviation    Wrist pronation 3+ 5  Wrist supination 3- 5  (Blank rows = not tested)  HAND FUNCTION: Grip strength: Right: (3rd level on dynamometer) 4 lbs; Left: 108 lbs, Lateral pinch: Right: 2 lbs, Left: 22 lbs, and 3 point  pinch: Right: 0 lbs, Left: 21 lbs (Saehan pinch gauge)  COORDINATION: Finger Nose Finger test: RUE with difficulty 9 Hole Peg test: Right: unable sec; Left: 31.5 sec  SENSATION: Light touch: Impaired  pins and needles down the arm   EDEMA: R hand moderately edematous: 24 cm circumferentially at the MCPs of digits 2-5 (L 22.5 cm)  MUSCLE TONE: RUE: Mild and Hypertonic  COGNITION: Overall cognitive status: Within functional limits for tasks assessed  VISION: Baseline vision: No visual deficits  VISION ASSESSMENT: WFL  PERCEPTION: WFL  PRAXIS: Impaired: Motor planning  TODAY'S TREATMENT:                                                                                                                              DATE: 02/09/23 Moist heat applied to R shoulder for pain reduction/muscle relaxation during completion of cane stretches in supine.  Therapeutic Exercise: In supine, performed passive R scapular depression and retraction, and passive R shoulder flex, abd, ER/IR, horiz abd/add within pain free range.  Reviewed and completed 1 set 10 reps of cane stretches in supine for bilat chest press, shoulder flex, horiz abd/add, ER, and abd, and added bilat shoulder IR and extension behind back in standing (written handout issued for additions).  Facilitated R hand strengthening with use of hand gripper set at 6.6# to remove jumbo pegs from pegboard x2 trials.  OT provided hand over hand assist to reduce dropped pegs, intermittent min a, and with repetition, pt able to manage at least 25% of the 2 trials without assist from OT, vc only.  Intermittent passive digit and wrist extension stretching performed between gripping exercises to reduce R hand stiffness, and encouraged pt to perform stretches at home.  Pt agreed and states he stretches hand on table top frequently.    Neuro re-ed: Facilitated R hand [facility] skills with pt making attempts at grasping jumbo pegs from container in prep to place  into pegboard.  Pt unsuccessful, so OT downgraded to holding peg vertically for pt to grab.  Pt then used a gross grasp with all fingers to secure the peg to place into pegboard for 2 trials.   PATIENT EDUCATION: Education details: RUE coordination and strengthening Person educated: Patient Education method: Explanation, demo Education comprehension: verbalized understanding, demonstrated understanding  HOME EXERCISE PROGRAM: Theraputty exercises, cane stretches  GOALS: Goals reviewed with patient? Yes  SHORT TERM GOALS: Target date: 03/11/23 (6 weeks)  Pt will be indep to perform HEP for improving RUE strength and coordination. Baseline: Eval: not yet initiated Goal status: INITIAL  LONG TERM GOALS: Target date: 04/22/23 (12 weeks)  Pt will increase FOTO score to 43 or better to indicate improvement in self perceived functional use of the R arm with daily tasks. Baseline: Eval: 37 Goal status: INITIAL  2.  Pt will increase R grip strength by 15 or more lbs in order to improve ability to hold and carry light ADL supplies in R dominant hand.    Baseline: Eval: R grip 4 lbs, L 108 lbs (3rd slot on dynamometer)  Goal status: INITIAL  3.  Pt will increase R lateral pinch strength to be able to manipulate playing cards with minimal dropping. Baseline: Eval: R 2 lbs (L 22 lbs); pt has not attempted playing cards since CVA Goal status: INITIAL  4.  Pt will improve FMC/dexterity skills in R hand to be able to engage the R hand to assist the L with clothing fasteners. Baseline: Eval: R 9 hole (unable), L 31.5 sec; manages clothing fasteners with L hand only. Goal status: INITIAL  5.  Pt will increase RUE strength and coordination to be able to engage the RUE at least 50% of the time with ADLs. Baseline: Eval: manages all ADLs with the L non-dominant hand. Goal status: INITIAL  6.  Pt will sign name for documents with R dominant hand, adapted pen as needed, with >50%  legibility. Baseline: Eval: Pt holds standard pen and can make marks on paper but not yet formulate letters of his name. Goal status: INITIAL  ASSESSMENT:  CLINICAL IMPRESSION: Pt reports he did his cane stretches at home today, but feels he may have overdone it a little d/t some soreness reported this date.  R shoulder pain managed well with moist heat today and gentle cane stretches, with good tolerance for selected additions of shoulder extension and IR behind back.  Pt reported the "new ones" felt good, and pain was reduced to 2-3/10 in R shoulder by end of session.  Noted significant improvement in R hand swelling this date.  OT encouraged pt to continue to engage the R hand at home for additional improvements with edema and active use of the hand.  Initiated hand gripper activity with jumbo pegs today, initially requiring min A to prevent dropped pegs on the lightest grip strength setting (6.6 lbs).  With repetition, pt was able to manage ~25% of pegs over 2 trials using hand gripper to grasp pegs and drop into containers.  Pt not yet able to grasp a peg with his R hand from a full container to set up the activity, but was able to use R hand for a gross grasp of the peg when OT handed pt a peg vertically for pt to grasp.  Pt continues to present with RUE weakness, now mild edema in the R hand, GMC/FMC deficits, decreased sensation to light touch, and mild hypertonicity throughout the RUE.  Pt will continue to  benefit from skilled OT to address above noted deficits in order to increase indep with ADLs and improve QOL.  PERFORMANCE DEFICITS: in functional skills including ADLs, IADLs, coordination, dexterity, sensation, edema, tone, ROM, strength, pain, flexibility, Fine motor control, Gross motor control, balance, body mechanics, decreased knowledge of use of DME, and UE functional use, and psychosocial skills including coping strategies, habits, and routines and behaviors.   IMPAIRMENTS: are  limiting patient from ADLs, IADLs, work, leisure, and social participation.   CO-MORBIDITIES: may have co-morbidities  that affects occupational performance. Patient will benefit from skilled OT to address above impairments and improve overall function.  MODIFICATION OR ASSISTANCE TO COMPLETE EVALUATION: No modification of tasks or assist necessary to complete an evaluation.  OT OCCUPATIONAL PROFILE AND HISTORY: Problem focused assessment: Including review of records relating to presenting problem.  CLINICAL DECISION MAKING: Moderate - several treatment options, min-mod task modification necessary  REHAB POTENTIAL: Good  EVALUATION COMPLEXITY: Moderate    PLAN:  OT FREQUENCY: 1-2x/week  OT DURATION: 12 weeks  PLANNED INTERVENTIONS: self care/ADL training, therapeutic exercise, therapeutic activity, neuromuscular re-education, manual therapy, passive range of motion, balance training, splinting, electrical stimulation, paraffin, moist heat, cryotherapy, contrast bath, patient/family education, coping strategies training, and DME and/or AE instructions  RECOMMENDED OTHER SERVICES: None at this time  CONSULTED AND AGREED WITH PLAN OF CARE: Patient  PLAN FOR NEXT SESSION: see above  Danelle Earthly, MS, OTR/L  Otis Dials, OT 02/14/2023, 12:41 PM

## 2023-02-16 ENCOUNTER — Telehealth: Payer: Self-pay | Admitting: Family Medicine

## 2023-02-16 NOTE — Telephone Encounter (Signed)
Copied from CRM [id number]. Topic: General - Inquiry >> Feb 16, 2023  9:54 AM Haroldine Laws wrote: Reason for CRM: Pt's mom called asking if the office has received a medical clearance to get dental work done.   She says they sent it over by fax on the 15th.  He is supposed to have some dental work today.  CB@  [phone]

## 2023-02-17 ENCOUNTER — Ambulatory Visit: Payer: Medicaid Other | Admitting: Occupational Therapy

## 2023-02-17 ENCOUNTER — Ambulatory Visit: Payer: Medicaid Other | Admitting: Physical Therapy

## 2023-02-17 ENCOUNTER — Encounter: Payer: Self-pay | Admitting: Occupational Therapy

## 2023-02-17 DIAGNOSIS — I63512 Cerebral infarction due to unspecified occlusion or stenosis of left middle cerebral artery: Secondary | ICD-10-CM

## 2023-02-17 DIAGNOSIS — R2681 Unsteadiness on feet: Secondary | ICD-10-CM | POA: Diagnosis not present

## 2023-02-17 DIAGNOSIS — M6281 Muscle weakness (generalized): Secondary | ICD-10-CM

## 2023-02-17 DIAGNOSIS — R278 Other lack of coordination: Secondary | ICD-10-CM

## 2023-02-17 NOTE — Therapy (Signed)
OUTPATIENT OCCUPATIONAL THERAPY NEURO TREATMENT NOTE  Patient Name: Ricardo Yang MRN: [id number] DOB:06-10-1976, 47 y.o., male Today's Date: 02/17/2023  PCP: Dr. Joseph Berkshire REFERRING PROVIDER: Dr. Joseph Berkshire  END OF SESSION:  OT End of Session - 02/17/23 1328     Visit Number 5    Number of Visits 24    Date for OT Re-Evaluation 04/22/23    Authorization Time Period Reporting period beginning 01/28/23    Progress Note Due on Visit 10    OT Start Time 1330    OT Stop Time 1415    OT Time Calculation (min) 45 min    Equipment Utilized During Treatment none    Activity Tolerance Patient tolerated treatment well    Behavior During Therapy [facility] for tasks assessed/performed             Past Medical History:  Diagnosis Date   Acute CVA (cerebrovascular accident) 10/04/2022   Acute ST elevation myocardial infarction (STEMI) involving left anterior descending (LAD) coronary artery 08/06/2019   Anxiety 09/2022   After stroke   CAD (coronary artery disease) 10/04/2022   Cerebrovascular accident (CVA) 10/07/2022   Chest pain    CHF (congestive heart failure)    Cocaine abuse    Cocaine use 10/06/2022   Depression December 2023   When stroke happened   Elevated troponin    Encounter for imaging to screen for metal prior to MRI 10/07/2022   Heavy smoker    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 question possible MS   NSTEMI (non-ST elevated myocardial infarction) 05/25/2015   Obesity    Polysubstance abuse    a. tobacco, cocaine, crack, ecstasy, pills, "anything but injections"   Primary hypertension 10/05/2022   Sleep apnea    Always   STEMI (ST elevation myocardial infarction) 08/06/2019   Past Surgical History:  Procedure Laterality Date   CARDIAC CATHETERIZATION N/A 05/28/2015   Procedure: Left Heart Cath and Coronary Angiography;  Surgeon: Antonieta Iba, MD;  Location: [facility];  Service: Cardiovascular;   Laterality: N/A;   CORONARY ANGIOGRAPHY N/A 04/22/2021   Procedure: CORONARY ANGIOGRAPHY;  Surgeon: Marcina Millard, MD;  Location: [facility];  Service: Cardiovascular;  Laterality: N/A;   CORONARY/GRAFT ACUTE MI REVASCULARIZATION N/A 08/06/2019   Procedure: Coronary/Graft Acute MI Revascularization;  Surgeon: Marcina Millard, MD;  Location: [facility];  Service: Cardiovascular;  Laterality: N/A;   LEFT HEART CATH N/A 04/22/2021   Procedure: Left Heart Cath;  Surgeon: Marcina Millard, MD;  Location: [facility];  Service: Cardiovascular;  Laterality: N/A;   LEFT HEART CATH AND CORONARY ANGIOGRAPHY N/A 08/06/2019   Procedure: LEFT HEART CATH AND CORONARY ANGIOGRAPHY;  Surgeon: Marcina Millard, MD;  Location: [facility];  Service: Cardiovascular;  Laterality: N/A;   TEE WITHOUT CARDIOVERSION N/A 10/08/2022   Procedure: TRANSESOPHAGEAL ECHOCARDIOGRAM (TEE);  Surgeon: Debbe Odea, MD;  Location: [facility];  Service: Cardiovascular;  Laterality: N/A;  11:30am   Patient Active Problem List   Diagnosis Date Noted   Sleep apnea 01/01/2023   Dysfunction of right rotator cuff 01/01/2023   History of CVA with residual deficit 12/04/2022   Mixed hyperlipidemia 12/04/2022   History of MI (myocardial infarction) 12/04/2022   Insomnia 10/17/2022   Slow transit constipation 10/17/2022   Chronic pain of both knees 10/17/2022   History of substance abuse 10/15/2022   HFrEF (heart failure with reduced ejection fraction) 10/07/2022  Expressive aphasia 10/05/2022   Right hemiplegia 10/05/2022   CAD S/P percutaneous coronary angioplasty 10/05/2022   Polysubstance abuse 10/04/2022   Essential hypertension 04/22/2021   Ischemic chest pain     ONSET DATE: 10/04/22  REFERRING DIAG: I63.9 (ICD-10-CM) - CVA (cerebral vascular accident)   THERAPY DIAG:  Muscle weakness (generalized)  Other lack of coordination  Left middle cerebral artery  stroke  Rationale for Evaluation and Treatment: Rehabilitation  SUBJECTIVE:  SUBJECTIVE STATEMENT: Pt reporting he has not been wearing his compression glove this weekend. Pt accompanied by: self  PERTINENT HISTORY: CVA in 12/23. Multiple Cardiac caths over the last 5 years, hx of polysubstance abuse  PRECAUTIONS: None  WEIGHT BEARING RESTRICTIONS: No  PAIN:  Are you having pain? Yes: NPRS scale: 5/10 beginning of session, end of session down to 2/10 Pain location: R shoulder  Pain description: sharp and achy and burning Aggravating factors: lifting the arm Relieving factors: recent cortisone shot within 6 weeks  FALLS: Has patient fallen in last 6 months? 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    PATIENT GOALS: "If I could use my hand 75%.  And to use my hand to use the bathroom."  Pt enjoyed playing cards and shooting pool, playing video games prior to CVA.  OBJECTIVE:   HAND DOMINANCE: Right  ADLs: Overall ADLs: performing ADLs predominantly with the L non-dominant hand Transfers/ambulation related to ADLs: without AD Eating: 1 handed cutting, L hand only Grooming: electric toothbrush, L hand only UB Dressing: assist with buttons and belt  LB Dressing: assist to tie shoes; performs all L handed Toileting: using L hand for toilet hygiene and clothing management Bathing: L hand only Tub Shower transfers: indep with tub shower transfer Equipment: none  IADLs: Shopping: able to manage light shopping trips, but typically goes with a family member for the driving assistance Light housekeeping: L hand only   Meal Prep: cold meal prep, some hot meal prep when able to use L hand only Community mobility: Pt reports he has not regularly driven since CVA, but has driven a couple of times to the store.  Ambulatory without AD. Medication management: easy open pill bottles and pt uses a pill organizer and sets up independently  Financial management: pt reports he has no current bills Handwriting:  Holds a pen loosely with R hand and can make a mark on paper but not able to write his initials   MOBILITY STATUS:  ambulatory without AD; see PT note for details.  POSTURE COMMENTS:  R sided hemiparesis Sitting balance: Moves/returns truncal midpoint >2 inches in all planes  ACTIVITY TOLERANCE: Activity tolerance: WFL for assessment, to be further assessed in future visits  FUNCTIONAL OUTCOME MEASURES: FOTO: 37 ; predicted 43  UPPER EXTREMITY ROM:    Active ROM Right eval Left Eval WNL  Shoulder flexion 95   Shoulder abduction    Shoulder adduction 68   Shoulder extension    Shoulder internal rotation Thumb to posterior hip   Shoulder external rotation Thumb to R ear with slight chin tuck and head tilt   Elbow flexion  Elbow extension    Wrist flexion    Wrist extension    Wrist ulnar deviation    Wrist radial deviation    Wrist pronation    Wrist supination    (Blank rows = not tested)  UPPER EXTREMITY MMT:     MMT Right eval Left eval  Shoulder flexion 3- 5  Shoulder abduction    Shoulder adduction 3- 5  Shoulder extension    Shoulder internal rotation 3- 5  Shoulder external rotation 3- 5  Middle trapezius    Lower trapezius    Elbow flexion 3+   Elbow extension 3+   Wrist flexion 4- 5  Wrist extension 4 5  Wrist ulnar deviation    Wrist radial deviation    Wrist pronation 3+ 5  Wrist supination 3- 5  (Blank rows = not tested)  HAND FUNCTION: Grip strength: Right: (3rd level on dynamometer) 4 lbs; Left: 108 lbs, Lateral pinch: Right: 2 lbs, Left: 22 lbs, and 3  point pinch: Right: 0 lbs, Left: 21 lbs (Saehan pinch gauge)  COORDINATION: Finger Nose Finger test: RUE with difficulty 9 Hole Peg test: Right: unable sec; Left: 31.5 sec  SENSATION: Light touch: Impaired  pins and needles down the arm   EDEMA: R hand moderately edematous: 24 cm circumferentially at the MCPs of digits 2-5 (L 22.5 cm)  MUSCLE TONE: RUE: Mild and Hypertonic  COGNITION: Overall cognitive status: Within functional limits for tasks assessed  VISION: Baseline vision: No visual deficits  VISION ASSESSMENT: WFL  PERCEPTION: WFL  PRAXIS: Impaired: Motor planning  TODAY'S TREATMENT:                                                                                                                              DATE: 02/17/23 Moist heat applied to R shoulder for pain reduction/muscle relaxation during AROM exercises.   Therapeutic Exercise: In sitting, performed AROM on tabletop - shoulder flex, horiz abd/add. Isolated finger tapping, assist for 3-5th digits. Intermittent passive digit and wrist extension stretching performed between [facility] exercises to reduce R hand stiffness, and encouraged pt to perform stretches at home.   Neuro re-ed: Pt focused on dominant RUE 3pt pinch and tip to tip pinch to remove 1 inch ball pegs and place in container. Pegboard held at incline on R side of table and container placed on L side of table. Pt intermittently drops pegs when crossing midline. Initial difficulty raising shoulder to retrieve pegs from top 1/2 of pegboard, improves with heat for decreased shoulder pain.     PATIENT EDUCATION: Education details: RUE coordination and strengthening Person educated: Patient Education method: Explanation, demo Education comprehension: verbalized understanding, demonstrated understanding  HOME EXERCISE PROGRAM: Theraputty exercises, cane stretches  GOALS: Goals reviewed with patient? Yes  SHORT TERM GOALS: Target date: 03/11/23 (6  weeks)  Pt will be indep to perform HEP for improving RUE strength and coordination. Baseline: Eval: not yet initiated Goal status: INITIAL  LONG TERM GOALS: Target date: 04/22/23 (12 weeks)  Pt will increase FOTO score to 43 or better to indicate improvement in self perceived functional use of the R arm with daily tasks. Baseline: Eval: 37 Goal status: INITIAL  2.  Pt will increase R grip strength by 15 or more lbs in order to improve ability to hold and carry light ADL supplies in R dominant hand.    Baseline: Eval: R grip 4 lbs, L 108 lbs (3rd slot on dynamometer)  Goal status: INITIAL  3.  Pt will increase R lateral pinch strength to be able to manipulate playing cards with minimal dropping. Baseline: Eval: R 2 lbs (L 22 lbs); pt has not attempted playing cards since CVA Goal status: INITIAL  4.  Pt will improve FMC/dexterity skills in R hand to be able to engage the R hand to assist the L with clothing fasteners. Baseline: Eval: R 9 hole (unable), L 31.5 sec; manages clothing fasteners with L hand only. Goal status: INITIAL  5.  Pt will increase RUE strength and coordination to be able to engage the RUE at least 50% of the time with ADLs. Baseline: Eval: manages all ADLs with the L non-dominant hand. Goal status: INITIAL  6.  Pt will sign name for documents with R dominant hand, adapted pen as needed, with >50% legibility. Baseline: Eval: Pt holds standard pen and can make marks on paper but not yet formulate letters of his name. Goal status: INITIAL  ASSESSMENT:  CLINICAL IMPRESSION: Pt reports he has not been wearing his compression glove this weekend, educated on importance of wearing t/o day for continued reduction in edema. Tolerates shoulder AROM exercises in gravity eliminated plane with no increase in R shoulder pain. Significantly increased time and rest breaks to remove ball pegs from pegboard held at incline. Difficulty reaching top half of board and intermittently  drops pegs when crossing midline to place in container. Pt continues to present with RUE weakness, now mild edema in the R hand, GMC/FMC deficits, decreased sensation to light touch, and mild hypertonicity throughout the RUE.  Pt will continue to benefit from skilled OT to address above noted deficits in order to increase indep with ADLs and improve QOL.  PERFORMANCE DEFICITS: in functional skills including ADLs, IADLs, coordination, dexterity, sensation, edema, tone, ROM, strength, pain, flexibility, Fine motor control, Gross motor control, balance, body mechanics, decreased knowledge of use of DME, and UE functional use, and psychosocial skills including coping strategies, habits, and routines and behaviors.   IMPAIRMENTS: are limiting patient from ADLs, IADLs, work, leisure, and social participation.   CO-MORBIDITIES: may have co-morbidities  that affects occupational performance. Patient will benefit from skilled OT to address above impairments and improve overall function.  MODIFICATION OR ASSISTANCE TO COMPLETE EVALUATION: No modification of tasks or assist necessary to complete an evaluation.  OT OCCUPATIONAL PROFILE AND HISTORY: Problem focused assessment: Including review of records relating to presenting problem.  CLINICAL DECISION MAKING: Moderate - several treatment options, min-mod task modification necessary  REHAB POTENTIAL: Good  EVALUATION COMPLEXITY: Moderate    PLAN:  OT FREQUENCY: 1-2x/week  OT DURATION: 12 weeks  PLANNED INTERVENTIONS: self care/ADL training, therapeutic exercise, therapeutic activity, neuromuscular re-education, manual therapy, passive range of motion, balance training, splinting, electrical stimulation, paraffin, moist heat, cryotherapy, contrast bath, patient/family education, coping strategies training, and DME and/or AE instructions  RECOMMENDED OTHER SERVICES: None at this time  CONSULTED AND AGREED WITH PLAN OF CARE: Patient  PLAN FOR NEXT  SESSION:  see above  Kathie Dike, M.S. OTR/L  02/17/23, 1:29 PM  ascom [phone]   Presley Raddle, OT 02/17/2023, 1:29 PM

## 2023-02-19 ENCOUNTER — Ambulatory Visit: Payer: Medicaid Other

## 2023-02-19 ENCOUNTER — Ambulatory Visit: Payer: Medicaid Other | Admitting: Physical Therapy

## 2023-02-19 DIAGNOSIS — I63512 Cerebral infarction due to unspecified occlusion or stenosis of left middle cerebral artery: Secondary | ICD-10-CM

## 2023-02-19 DIAGNOSIS — R278 Other lack of coordination: Secondary | ICD-10-CM

## 2023-02-19 DIAGNOSIS — R2681 Unsteadiness on feet: Secondary | ICD-10-CM | POA: Diagnosis not present

## 2023-02-19 DIAGNOSIS — M6281 Muscle weakness (generalized): Secondary | ICD-10-CM

## 2023-02-20 NOTE — Therapy (Signed)
OUTPATIENT OCCUPATIONAL THERAPY NEURO TREATMENT NOTE  Patient Name: Ricardo Yang MRN: [id number] DOB:1976-09-10, 47 y.o., male Today's Date: 02/20/2023  PCP: Dr. Joseph Berkshire REFERRING PROVIDER: Dr. Joseph Berkshire  END OF SESSION:  OT End of Session - 02/20/23 1016     Visit Number 6    Number of Visits 24    Date for OT Re-Evaluation 04/22/23    Authorization Time Period Reporting period beginning 01/28/23    Progress Note Due on Visit 10    OT Start Time 1515    OT Stop Time 1600    OT Time Calculation (min) 45 min    Equipment Utilized During Treatment none    Activity Tolerance Patient tolerated treatment well    Behavior During Therapy [facility] for tasks assessed/performed            Past Medical History:  Diagnosis Date   Acute CVA (cerebrovascular accident) (HCC) 10/04/2022   Acute ST elevation myocardial infarction (STEMI) involving left anterior descending (LAD) coronary artery (HCC) 08/06/2019   Anxiety 09/2022   After stroke   CAD (coronary artery disease) 10/04/2022   Cerebrovascular accident (CVA) (HCC) 10/07/2022   Chest pain    CHF (congestive heart failure) (HCC)    Cocaine abuse (HCC)    Cocaine use 10/06/2022   Depression December 2023   When stroke happened   Elevated troponin    Encounter for imaging to screen for metal prior to MRI 10/07/2022   Heavy smoker    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 question possible MS   NSTEMI (non-ST elevated myocardial infarction) (HCC) 05/25/2015   Obesity    Polysubstance abuse (HCC)    a. tobacco, cocaine, crack, ecstasy, pills, "anything but injections"   Primary hypertension 10/05/2022   Sleep apnea    Always   STEMI (ST elevation myocardial infarction) (HCC) 08/06/2019   Past Surgical History:  Procedure Laterality Date   CARDIAC CATHETERIZATION N/A 05/28/2015   Procedure: Left Heart Cath and Coronary Angiography;  Surgeon: Antonieta Iba, MD;   Location: [facility] INVASIVE CV LAB;  Service: Cardiovascular;  Laterality: N/A;   CORONARY ANGIOGRAPHY N/A 04/22/2021   Procedure: CORONARY ANGIOGRAPHY;  Surgeon: Marcina Millard, MD;  Location: [facility] INVASIVE CV LAB;  Service: Cardiovascular;  Laterality: N/A;   CORONARY/GRAFT ACUTE MI REVASCULARIZATION N/A 08/06/2019   Procedure: Coronary/Graft Acute MI Revascularization;  Surgeon: Marcina Millard, MD;  Location: [facility] INVASIVE CV LAB;  Service: Cardiovascular;  Laterality: N/A;   LEFT HEART CATH N/A 04/22/2021   Procedure: Left Heart Cath;  Surgeon: Marcina Millard, MD;  Location: [facility] INVASIVE CV LAB;  Service: Cardiovascular;  Laterality: N/A;   LEFT HEART CATH AND CORONARY ANGIOGRAPHY N/A 08/06/2019   Procedure: LEFT HEART CATH AND CORONARY ANGIOGRAPHY;  Surgeon: Marcina Millard, MD;  Location: [facility] INVASIVE CV LAB;  Service: Cardiovascular;  Laterality: N/A;   TEE WITHOUT CARDIOVERSION N/A 10/08/2022   Procedure: TRANSESOPHAGEAL ECHOCARDIOGRAM (TEE);  Surgeon: Debbe Odea, MD;  Location: [facility] ORS;  Service: Cardiovascular;  Laterality: N/A;  11:30am   Patient Active Problem List   Diagnosis Date Noted   Sleep apnea 01/01/2023   Dysfunction of right rotator cuff 01/01/2023   History of CVA with residual deficit 12/04/2022   Mixed hyperlipidemia 12/04/2022   History of MI (myocardial infarction) 12/04/2022   Insomnia 10/17/2022   Slow transit constipation 10/17/2022   Chronic pain of both knees 10/17/2022   History of substance abuse (HCC) 10/15/2022  HFrEF (heart failure with reduced ejection fraction) (HCC) 10/07/2022   Expressive aphasia 10/05/2022   Right hemiplegia (HCC) 10/05/2022   CAD S/P percutaneous coronary angioplasty 10/05/2022   Polysubstance abuse (HCC) 10/04/2022   Essential hypertension 04/22/2021   Ischemic chest pain [facility])    ONSET DATE: 10/04/22  REFERRING DIAG: I63.9 (ICD-10-CM) - CVA (cerebral vascular accident)   THERAPY DIAG:   Muscle weakness (generalized)  Other lack of coordination  Left middle cerebral artery stroke (HCC)  Rationale for Evaluation and Treatment: Rehabilitation  SUBJECTIVE:  SUBJECTIVE STATEMENT: Pt reports doing well today.  Some soreness in the R shoulder. Pt accompanied by: self  PERTINENT HISTORY: CVA in 12/23. Multiple Cardiac caths over the last 5 years, hx of polysubstance abuse  PRECAUTIONS: None  WEIGHT BEARING RESTRICTIONS: No  PAIN:  Are you having pain? Yes: NPRS scale: 5/10 beginning of session, end of session down to 2/10 Pain location: R shoulder  Pain description: sharp and achy and burning Aggravating factors: lifting the arm Relieving factors: recent cortisone shot within 6 weeks  FALLS: Has patient fallen in last 6 months? 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    PATIENT GOALS: "If I could use my hand 75%.  And to use my hand to use the bathroom."  Pt enjoyed playing cards and shooting pool, playing video games prior to CVA.  OBJECTIVE:   HAND DOMINANCE: Right  ADLs: Overall ADLs: performing ADLs predominantly with the L non-dominant hand Transfers/ambulation related to ADLs: without AD Eating: 1 handed cutting, L hand only Grooming: electric toothbrush, L hand only UB Dressing: assist with buttons and belt  LB Dressing: assist to tie shoes; performs all L handed Toileting: using L hand for toilet hygiene and clothing management Bathing: L hand only Tub Shower transfers: indep with tub shower transfer Equipment: none  IADLs: Shopping: able to manage light shopping trips, but  typically goes with a family member for the driving assistance Light housekeeping: L hand only  Meal Prep: cold meal prep, some hot meal prep when able to use L hand only Community mobility: Pt reports he has not regularly driven since CVA, but has driven a couple of times to the store.  Ambulatory without AD. Medication management: easy open pill bottles and pt uses a pill organizer and sets up independently  Financial management: pt reports he has no current bills Handwriting:  Holds a pen loosely with R hand and can make a mark on paper but not able to write his initials   MOBILITY STATUS:  ambulatory without AD; see PT note for details.  POSTURE COMMENTS:  R sided hemiparesis Sitting balance: Moves/returns truncal midpoint >2 inches in all planes  ACTIVITY TOLERANCE: Activity tolerance: WFL for assessment, to be further assessed in future visits  FUNCTIONAL OUTCOME MEASURES: FOTO: 37 ; predicted 43  UPPER EXTREMITY ROM:    Active ROM Right eval Left Eval WNL  Shoulder flexion 95   Shoulder abduction    Shoulder adduction 68   Shoulder extension    Shoulder internal rotation Thumb to posterior hip   Shoulder external rotation Thumb to  R ear with slight chin tuck and head tilt   Elbow flexion    Elbow extension    Wrist flexion    Wrist extension    Wrist ulnar deviation    Wrist radial deviation    Wrist pronation    Wrist supination    (Blank rows = not tested)  UPPER EXTREMITY MMT:     MMT Right eval Left eval  Shoulder flexion 3- 5  Shoulder abduction    Shoulder adduction 3- 5  Shoulder extension    Shoulder internal rotation 3- 5  Shoulder external rotation 3- 5  Middle trapezius    Lower trapezius    Elbow flexion 3+   Elbow extension 3+   Wrist flexion 4- 5  Wrist extension 4 5  Wrist ulnar deviation    Wrist radial deviation    Wrist pronation 3+ 5  Wrist supination 3- 5  (Blank rows = not tested)  HAND FUNCTION: Grip strength: Right: (3rd  level on dynamometer) 4 lbs; Left: 108 lbs, Lateral pinch: Right: 2 lbs, Left: 22 lbs, and 3 point pinch: Right: 0 lbs, Left: 21 lbs (Saehan pinch gauge)  COORDINATION: Finger Nose Finger test: RUE with difficulty 9 Hole Peg test: Right: unable sec; Left: 31.5 sec  SENSATION: Light touch: Impaired  pins and needles down the arm   EDEMA: R hand moderately edematous: 24 cm circumferentially at the MCPs of digits 2-5 (L 22.5 cm)  MUSCLE TONE: RUE: Mild and Hypertonic  COGNITION: Overall cognitive status: Within functional limits for tasks assessed  VISION: Baseline vision: No visual deficits  VISION ASSESSMENT: WFL  PERCEPTION: WFL  PRAXIS: Impaired: Motor planning  TODAY'S TREATMENT:                                                                                                                              DATE: 02/09/23 Moist heat applied to R shoulder for pain reduction/muscle relaxation during completion of ROM noted below.  Kept heat on for grooming tasks noted below.  Therapeutic Exercise: In sitting performed passive R scapular depression and retraction, and passive R shoulder flex, abd, ER/IR, horiz abd/add within pain free range. Completed passive R forearm supination stretch, R wrist flex/ex/RD/UD, and flex/ext of digits 1-5, including thumb radial abd, working to decrease stiffness to better engage the RUE into daily tasks.  Self Care: Instructed pt in AE and modified strategies for nail care this date.  Pt practiced with a nail clipper secured to a wide base/platform which enabled him to use R palm to press down nail clipper for clipping L hand fingernails.  Pt used a standard clipper in the L hand to clip R hand fingernails, but struggled to stabilize R hand fingers.  Pt was cued to position R hand flat on table top, but found that pt required fingertips to hang free from edge of table.  Found that pt still required manual assist from OT to stabilize Mps and  PIPs of R hand  to achieve optimal clipping angle for the L hand.  Pt practiced use of nail file with a built up grip, and with a standard nail file on table top with a piece of dycem beneath.  Both strategies effective.  Overall min A for nail care to intermittently stabilize R hand.  Issued dycem for home to secure/stabilize ADL supplies as needed.  PATIENT EDUCATION: Education details: nail care strategies Person educated: Patient Education method: Explanation, demo Education comprehension: verbalized understanding, demonstrated understanding  HOME EXERCISE PROGRAM: Theraputty exercises, cane stretches  GOALS: Goals reviewed with patient? Yes  SHORT TERM GOALS: Target date: 03/11/23 (6 weeks)  Pt will be indep to perform HEP for improving RUE strength and coordination. Baseline: Eval: not yet initiated Goal status: INITIAL  LONG TERM GOALS: Target date: 04/22/23 (12 weeks)  Pt will increase FOTO score to 43 or better to indicate improvement in self perceived functional use of the R arm with daily tasks. Baseline: Eval: 37 Goal status: INITIAL  2.  Pt will increase R grip strength by 15 or more lbs in order to improve ability to hold and carry light ADL supplies in R dominant hand.    Baseline: Eval: R grip 4 lbs, L 108 lbs (3rd slot on dynamometer)  Goal status: INITIAL  3.  Pt will increase R lateral pinch strength to be able to manipulate playing cards with minimal dropping. Baseline: Eval: R 2 lbs (L 22 lbs); pt has not attempted playing cards since CVA Goal status: INITIAL  4.  Pt will improve FMC/dexterity skills in R hand to be able to engage the R hand to assist the L with clothing fasteners. Baseline: Eval: R 9 hole (unable), L 31.5 sec; manages clothing fasteners with L hand only. Goal status: INITIAL  5.  Pt will increase RUE strength and coordination to be able to engage the RUE at least 50% of the time with ADLs. Baseline: Eval: manages all ADLs with the L non-dominant hand. Goal  status: INITIAL  6.  Pt will sign name for documents with R dominant hand, adapted pen as needed, with >50% legibility. Baseline: Eval: Pt holds standard pen and can make marks on paper but not yet formulate letters of his name. Goal status: INITIAL  ASSESSMENT:  CLINICAL IMPRESSION: Pt tolerated RUE passive stretching well this date.  R hand edema continues to improve.  Pt continues to report R shoulder pain and stiffness, which improves with ROM during session.  Pt was cued to try to relax RUE during mobility as he tends to guard it with a shoulder hike, elbow flexion, and IR of the shoulder.  Pt able to return demo of a gentle arm swing, and stated that he tends to guard it as he is worried that his arm will hurt if he lets it hang.  OT encouraged pt try gentle arm swings as tolerated and educated that a guarded arm will tend to increase pain and stiffness.  Pt verbalized understanding.  Focus on grooming strategies for nail care this date.  Pt practiced using R hand palm to press down on a nail clipper secured to a wide base for clipping L hand fingernails, and using a standard clipper with L hand to clip R hand fingernails.  Overall min A to stabilize R hand throughout.  Pt benefited from a wide grip base on a nail file, and also a standard nail file on top of dycem on table top.  Dycem issued today  for home ADL supplies.  Pt continues to present with RUE weakness, now mild edema in the R hand, GMC/FMC deficits, decreased sensation to light touch, and mild hypertonicity throughout the RUE.  Pt will continue to benefit from skilled OT to address above noted deficits in order to increase indep with ADLs and improve QOL.  PERFORMANCE DEFICITS: in functional skills including ADLs, IADLs, coordination, dexterity, sensation, edema, tone, ROM, strength, pain, flexibility, Fine motor control, Gross motor control, balance, body mechanics, decreased knowledge of use of DME, and UE functional use, and  psychosocial skills including coping strategies, habits, and routines and behaviors.   IMPAIRMENTS: are limiting patient from ADLs, IADLs, work, leisure, and social participation.   CO-MORBIDITIES: may have co-morbidities  that affects occupational performance. Patient will benefit from skilled OT to address above impairments and improve overall function.  MODIFICATION OR ASSISTANCE TO COMPLETE EVALUATION: No modification of tasks or assist necessary to complete an evaluation.  OT OCCUPATIONAL PROFILE AND HISTORY: Problem focused assessment: Including review of records relating to presenting problem.  CLINICAL DECISION MAKING: Moderate - several treatment options, min-mod task modification necessary  REHAB POTENTIAL: Good  EVALUATION COMPLEXITY: Moderate    PLAN:  OT FREQUENCY: 1-2x/week  OT DURATION: 12 weeks  PLANNED INTERVENTIONS: self care/ADL training, therapeutic exercise, therapeutic activity, neuromuscular re-education, manual therapy, passive range of motion, balance training, splinting, electrical stimulation, paraffin, moist heat, cryotherapy, contrast bath, patient/family education, coping strategies training, and DME and/or AE instructions  RECOMMENDED OTHER SERVICES: None at this time  CONSULTED AND AGREED WITH PLAN OF CARE: Patient  PLAN FOR NEXT SESSION: see above  Danelle Earthly, MS, OTR/L  Otis Dials, OT 02/20/2023, 10:18 AM

## 2023-02-24 ENCOUNTER — Ambulatory Visit: Payer: Medicaid Other

## 2023-02-24 ENCOUNTER — Telehealth: Payer: Self-pay | Admitting: Cardiology

## 2023-02-24 ENCOUNTER — Ambulatory Visit: Payer: Medicaid Other | Admitting: Physical Therapy

## 2023-02-24 DIAGNOSIS — R278 Other lack of coordination: Secondary | ICD-10-CM

## 2023-02-24 DIAGNOSIS — I63512 Cerebral infarction due to unspecified occlusion or stenosis of left middle cerebral artery: Secondary | ICD-10-CM

## 2023-02-24 DIAGNOSIS — M6281 Muscle weakness (generalized): Secondary | ICD-10-CM

## 2023-02-24 DIAGNOSIS — R2681 Unsteadiness on feet: Secondary | ICD-10-CM | POA: Diagnosis not present

## 2023-02-24 NOTE — Telephone Encounter (Signed)
Good Morning Dr. Azucena Cecil,  Ricardo Yang requested surgical clearance for upcoming dental extraction of 4 teeth and is currently on Plavix and ASA 81 mg.  He has a past medical history of CAD s/p anterior STEMI 07/2019 with PCI/DES to LAD and HFrEF with an EF of less than 20%, previous CVA 09/2022 with right arm weakness.  Can you please offer your guidance on holding his Plavix and aspirin and also if you feel he is at acceptable risk to proceed with his scheduled procedure?  Thank you very much for your time and looking forward to your recommendations.  Robin Searing, NP

## 2023-02-24 NOTE — Therapy (Signed)
OUTPATIENT OCCUPATIONAL THERAPY NEURO TREATMENT NOTE  Patient Name: Ricardo Yang MRN: [id number] DOB:1976-04-29, 47 y.o., male Today's Date: 02/24/2023  PCP: Dr. Joseph Berkshire REFERRING PROVIDER: Dr. Joseph Berkshire  END OF SESSION:  OT End of Session - 02/24/23 1621     Visit Number 7    Number of Visits 24    Date for OT Re-Evaluation 04/22/23    Authorization Time Period Reporting period beginning 01/28/23    Progress Note Due on Visit 10    OT Start Time 1345    OT Stop Time 1430    OT Time Calculation (min) 45 min    Equipment Utilized During Treatment none    Activity Tolerance Patient tolerated treatment well    Behavior During Therapy [facility] for tasks assessed/performed            Past Medical History:  Diagnosis Date   Acute CVA (cerebrovascular accident) (HCC) 10/04/2022   Acute ST elevation myocardial infarction (STEMI) involving left anterior descending (LAD) coronary artery (HCC) 08/06/2019   Anxiety 09/2022   After stroke   CAD (coronary artery disease) 10/04/2022   Cerebrovascular accident (CVA) (HCC) 10/07/2022   Chest pain    CHF (congestive heart failure) (HCC)    Cocaine abuse (HCC)    Cocaine use 10/06/2022   Depression December 2023   When stroke happened   Elevated troponin    Encounter for imaging to screen for metal prior to MRI 10/07/2022   Heavy smoker    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 question possible MS   NSTEMI (non-ST elevated myocardial infarction) (HCC) 05/25/2015   Obesity    Polysubstance abuse (HCC)    a. tobacco, cocaine, crack, ecstasy, pills, "anything but injections"   Primary hypertension 10/05/2022   Sleep apnea    Always   STEMI (ST elevation myocardial infarction) (HCC) 08/06/2019   Past Surgical History:  Procedure Laterality Date   CARDIAC CATHETERIZATION N/A 05/28/2015   Procedure: Left Heart Cath and Coronary Angiography;  Surgeon: Antonieta Iba, MD;   Location: [facility] INVASIVE CV LAB;  Service: Cardiovascular;  Laterality: N/A;   CORONARY ANGIOGRAPHY N/A 04/22/2021   Procedure: CORONARY ANGIOGRAPHY;  Surgeon: Marcina Millard, MD;  Location: [facility] INVASIVE CV LAB;  Service: Cardiovascular;  Laterality: N/A;   CORONARY/GRAFT ACUTE MI REVASCULARIZATION N/A 08/06/2019   Procedure: Coronary/Graft Acute MI Revascularization;  Surgeon: Marcina Millard, MD;  Location: [facility] INVASIVE CV LAB;  Service: Cardiovascular;  Laterality: N/A;   LEFT HEART CATH N/A 04/22/2021   Procedure: Left Heart Cath;  Surgeon: Marcina Millard, MD;  Location: [facility] INVASIVE CV LAB;  Service: Cardiovascular;  Laterality: N/A;   LEFT HEART CATH AND CORONARY ANGIOGRAPHY N/A 08/06/2019   Procedure: LEFT HEART CATH AND CORONARY ANGIOGRAPHY;  Surgeon: Marcina Millard, MD;  Location: [facility] INVASIVE CV LAB;  Service: Cardiovascular;  Laterality: N/A;   TEE WITHOUT CARDIOVERSION N/A 10/08/2022   Procedure: TRANSESOPHAGEAL ECHOCARDIOGRAM (TEE);  Surgeon: Debbe Odea, MD;  Location: [facility] ORS;  Service: Cardiovascular;  Laterality: N/A;  11:30am   Patient Active Problem List   Diagnosis Date Noted   Sleep apnea 01/01/2023   Dysfunction of right rotator cuff 01/01/2023   History of CVA with residual deficit 12/04/2022   Mixed hyperlipidemia 12/04/2022   History of MI (myocardial infarction) 12/04/2022   Insomnia 10/17/2022   Slow transit constipation 10/17/2022   Chronic pain of both knees 10/17/2022   History of substance abuse (HCC) 10/15/2022  HFrEF (heart failure with reduced ejection fraction) (HCC) 10/07/2022   Expressive aphasia 10/05/2022   Right hemiplegia (HCC) 10/05/2022   CAD S/P percutaneous coronary angioplasty 10/05/2022   Polysubstance abuse (HCC) 10/04/2022   Essential hypertension 04/22/2021   Ischemic chest pain [facility])    ONSET DATE: 10/04/22  REFERRING DIAG: I63.9 (ICD-10-CM) - CVA (cerebral vascular accident)   THERAPY DIAG:   Muscle weakness (generalized)  Other lack of coordination  Left middle cerebral artery stroke (HCC)  Rationale for Evaluation and Treatment: Rehabilitation  SUBJECTIVE:  SUBJECTIVE STATEMENT: Pt reports increased soreness in the R shoulder today.  He did state that he didn't get a chance to do his stretches this morning. Pt accompanied by: self  PERTINENT HISTORY: CVA in 12/23. Multiple Cardiac caths over the last 5 years, hx of polysubstance abuse  PRECAUTIONS: None  WEIGHT BEARING RESTRICTIONS: No  PAIN:  Are you having pain? Yes: NPRS scale: 4/10 Pain location: R shoulder  Pain description: sharp and achy and burning Aggravating factors: lifting the arm Relieving factors: recent cortisone shot within 6 weeks  FALLS: Has patient fallen in last 6 months? 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    PATIENT GOALS: "If I could use my hand 75%.  And to use my hand to use the bathroom."  Pt enjoyed playing cards and shooting pool, playing video games prior to CVA.  OBJECTIVE:   HAND DOMINANCE: Right  ADLs: Overall ADLs: performing ADLs predominantly with the L non-dominant hand Transfers/ambulation related to ADLs: without AD Eating: 1 handed cutting, L hand only Grooming: electric toothbrush, L hand only UB Dressing: assist with buttons and belt  LB Dressing: assist to tie shoes; performs all L handed Toileting: using L hand for toilet hygiene and clothing management Bathing: L hand only Tub Shower transfers: indep with tub shower transfer Equipment: none  IADLs: Shopping: able to manage light shopping  trips, but typically goes with a family member for the driving assistance Light housekeeping: L hand only  Meal Prep: cold meal prep, some hot meal prep when able to use L hand only Community mobility: Pt reports he has not regularly driven since CVA, but has driven a couple of times to the store.  Ambulatory without AD. Medication management: easy open pill bottles and pt uses a pill organizer and sets up independently  Financial management: pt reports he has no current bills Handwriting:  Holds a pen loosely with R hand and can make a mark on paper but not able to write his initials   MOBILITY STATUS:  ambulatory without AD; see PT note for details.  POSTURE COMMENTS:  R sided hemiparesis Sitting balance: Moves/returns truncal midpoint >2 inches in all planes  ACTIVITY TOLERANCE: Activity tolerance: WFL for assessment, to be further assessed in future visits  FUNCTIONAL OUTCOME MEASURES: FOTO: 37 ; predicted 43  UPPER EXTREMITY ROM:    Active ROM Right eval Left Eval WNL  Shoulder flexion 95   Shoulder abduction    Shoulder adduction 68   Shoulder extension    Shoulder internal rotation Thumb to posterior hip   Shoulder  external rotation Thumb to R ear with slight chin tuck and head tilt   Elbow flexion    Elbow extension    Wrist flexion    Wrist extension    Wrist ulnar deviation    Wrist radial deviation    Wrist pronation    Wrist supination    (Blank rows = not tested)  UPPER EXTREMITY MMT:     MMT Right eval Left eval  Shoulder flexion 3- 5  Shoulder abduction    Shoulder adduction 3- 5  Shoulder extension    Shoulder internal rotation 3- 5  Shoulder external rotation 3- 5  Middle trapezius    Lower trapezius    Elbow flexion 3+   Elbow extension 3+   Wrist flexion 4- 5  Wrist extension 4 5  Wrist ulnar deviation    Wrist radial deviation    Wrist pronation 3+ 5  Wrist supination 3- 5  (Blank rows = not tested)  HAND FUNCTION: Grip strength:  Right: (3rd level on dynamometer) 4 lbs; Left: 108 lbs, Lateral pinch: Right: 2 lbs, Left: 22 lbs, and 3 point pinch: Right: 0 lbs, Left: 21 lbs (Saehan pinch gauge)  COORDINATION: Finger Nose Finger test: RUE with difficulty 9 Hole Peg test: Right: unable sec; Left: 31.5 sec  SENSATION: Light touch: Impaired  pins and needles down the arm   EDEMA: R hand moderately edematous: 24 cm circumferentially at the MCPs of digits 2-5 (L 22.5 cm)  MUSCLE TONE: RUE: Mild and Hypertonic  COGNITION: Overall cognitive status: Within functional limits for tasks assessed  VISION: Baseline vision: No visual deficits  VISION ASSESSMENT: WFL  PERCEPTION: WFL  PRAXIS: Impaired: Motor planning  TODAY'S TREATMENT:                                                                                                                              DATE: 02/24/23 Moist heat applied to R shoulder for pain reduction/muscle relaxation during completion of ROM noted below.    Therapeutic Exercise: In sitting performed passive R wrist and digit extension stretching, and passive R forearm supination.  Pt participated in sitting UBE x3 min forward rotation x3 min reverse rotation, working to increase RUE strength and mobilize R scapula.  R hand was secured to pedal with ACE wrap and OT provided support to the R elbow with forward and reverse rotations.  Light resistance for each direction, and UBE was lowered to reduce R shoulder pain.  Transitioned to supine for additional passive stretching for R shoulder flex, abd, ER/IR, forearm pron/sup, and wrist and digit flex ext.  Pt participated in light resistance strengthening in supine with yellow theraband for R elbow flex, R elbow ext, R shoulder ER and IR with elbow at side.  Provided light manual resistance for reps of R wrist flex/ext; completed 2 sets 10 reps of above noted resistive exercises.  Attempted chest press in supine with yellow band, but pt unable to  tolerate  with R shoulder pain.  PATIENT EDUCATION: Education details: RUE strengthening Person educated: Patient Education method: Explanation, demo Education comprehension: verbalized understanding, demonstrated understanding  HOME EXERCISE PROGRAM: Theraputty exercises, cane stretches  GOALS: Goals reviewed with patient? Yes  SHORT TERM GOALS: Target date: 03/11/23 (6 weeks)  Pt will be indep to perform HEP for improving RUE strength and coordination. Baseline: Eval: not yet initiated Goal status: INITIAL  LONG TERM GOALS: Target date: 04/22/23 (12 weeks)  Pt will increase FOTO score to 43 or better to indicate improvement in self perceived functional use of the R arm with daily tasks. Baseline: Eval: 37 Goal status: INITIAL  2.  Pt will increase R grip strength by 15 or more lbs in order to improve ability to hold and carry light ADL supplies in R dominant hand.    Baseline: Eval: R grip 4 lbs, L 108 lbs (3rd slot on dynamometer)  Goal status: INITIAL  3.  Pt will increase R lateral pinch strength to be able to manipulate playing cards with minimal dropping. Baseline: Eval: R 2 lbs (L 22 lbs); pt has not attempted playing cards since CVA Goal status: INITIAL  4.  Pt will improve FMC/dexterity skills in R hand to be able to engage the R hand to assist the L with clothing fasteners. Baseline: Eval: R 9 hole (unable), L 31.5 sec; manages clothing fasteners with L hand only. Goal status: INITIAL  5.  Pt will increase RUE strength and coordination to be able to engage the RUE at least 50% of the time with ADLs. Baseline: Eval: manages all ADLs with the L non-dominant hand. Goal status: INITIAL  6.  Pt will sign name for documents with R dominant hand, adapted pen as needed, with >50% legibility. Baseline: Eval: Pt holds standard pen and can make marks on paper but not yet formulate letters of his name. Goal status: INITIAL  ASSESSMENT:  CLINICAL IMPRESSION: Pt tolerated RUE  passive stretching well this date.  Pt continues to report R shoulder pain and stiffness, which improves with moist heat and ROM during session.  Pt requires support at the elbow and wrist for proper form and technique with yellow theraband exercises.  Pt tolerated UBE today with light resistance, ACE wrap to secure R hand, and OT assist to support R arm at the elbow during forward and reverse rotations.  UBE was lowered just below shoulder level at top of rotation to reduce R shoulder discomfort.  Pt states that he continues to try to engage the RUE with everything he does, and stated that he even tried to use his R hand to eat spaghetti last night.  Pt used his built up foam handle over his fork but did state it was still very frustrating.  OT provided positive reinforcement for his attempts at self feeding with the R hand.  Pt continues to present with RUE weakness, now mild edema in the R hand, GMC/FMC deficits, decreased sensation to light touch, and mild hypertonicity throughout the RUE.  Pt will continue to benefit from skilled OT to address above noted deficits in order to increase indep with ADLs and improve QOL.  PERFORMANCE DEFICITS: in functional skills including ADLs, IADLs, coordination, dexterity, sensation, edema, tone, ROM, strength, pain, flexibility, Fine motor control, Gross motor control, balance, body mechanics, decreased knowledge of use of DME, and UE functional use, and psychosocial skills including coping strategies, habits, and routines and behaviors.   IMPAIRMENTS: are limiting patient from ADLs, IADLs, work,  leisure, and social participation.   CO-MORBIDITIES: may have co-morbidities  that affects occupational performance. Patient will benefit from skilled OT to address above impairments and improve overall function.  MODIFICATION OR ASSISTANCE TO COMPLETE EVALUATION: No modification of tasks or assist necessary to complete an evaluation.  OT OCCUPATIONAL PROFILE AND HISTORY:  Problem focused assessment: Including review of records relating to presenting problem.  CLINICAL DECISION MAKING: Moderate - several treatment options, min-mod task modification necessary  REHAB POTENTIAL: Good  EVALUATION COMPLEXITY: Moderate    PLAN:  OT FREQUENCY: 1-2x/week  OT DURATION: 12 weeks  PLANNED INTERVENTIONS: self care/ADL training, therapeutic exercise, therapeutic activity, neuromuscular re-education, manual therapy, passive range of motion, balance training, splinting, electrical stimulation, paraffin, moist heat, cryotherapy, contrast bath, patient/family education, coping strategies training, and DME and/or AE instructions  RECOMMENDED OTHER SERVICES: None at this time  CONSULTED AND AGREED WITH PLAN OF CARE: Patient  PLAN FOR NEXT SESSION: see above  Danelle Earthly, MS, OTR/L  Otis Dials, OT 02/24/2023, 4:22 PM

## 2023-02-24 NOTE — Telephone Encounter (Signed)
   Pre-operative Risk Assessment    Patient Name: Ricardo Yang  DOB: 1976/02/04 MRN: [id number]  Request for Surgical Clearance{    Procedure:  Dental Extraction - Amount of Teeth to be Pulled:  4 TEETH TO BE EXTRACTED  Date of Surgery:  Clearance TBD                               Surgeon:  NOT INDICATED Surgeon's Group or Practice Name:  [facility] Phone number:  [phone] Fax number:  [phone]  Type of Clearance Requested:   - Medical    Type of Anesthesia:  General    Additional requests/questions:    Signed, Dalia Heading   02/24/2023, 9:21 AM

## 2023-02-26 ENCOUNTER — Ambulatory Visit: Payer: Medicaid Other | Admitting: Physical Therapy

## 2023-02-26 ENCOUNTER — Ambulatory Visit: Payer: Medicaid Other | Attending: Family Medicine

## 2023-02-26 DIAGNOSIS — R2681 Unsteadiness on feet: Secondary | ICD-10-CM | POA: Insufficient documentation

## 2023-02-26 DIAGNOSIS — R2689 Other abnormalities of gait and mobility: Secondary | ICD-10-CM | POA: Diagnosis present

## 2023-02-26 DIAGNOSIS — R278 Other lack of coordination: Secondary | ICD-10-CM | POA: Diagnosis present

## 2023-02-26 DIAGNOSIS — I63512 Cerebral infarction due to unspecified occlusion or stenosis of left middle cerebral artery: Secondary | ICD-10-CM

## 2023-02-26 DIAGNOSIS — F1911 Other psychoactive substance abuse, in remission: Secondary | ICD-10-CM | POA: Diagnosis present

## 2023-02-26 DIAGNOSIS — I2510 Atherosclerotic heart disease of native coronary artery without angina pectoris: Secondary | ICD-10-CM | POA: Insufficient documentation

## 2023-02-26 DIAGNOSIS — R279 Unspecified lack of coordination: Secondary | ICD-10-CM | POA: Insufficient documentation

## 2023-02-26 DIAGNOSIS — M6281 Muscle weakness (generalized): Secondary | ICD-10-CM | POA: Insufficient documentation

## 2023-02-26 DIAGNOSIS — I10 Essential (primary) hypertension: Secondary | ICD-10-CM | POA: Insufficient documentation

## 2023-02-26 DIAGNOSIS — I6930 Unspecified sequelae of cerebral infarction: Secondary | ICD-10-CM | POA: Insufficient documentation

## 2023-02-26 DIAGNOSIS — Z9861 Coronary angioplasty status: Secondary | ICD-10-CM | POA: Insufficient documentation

## 2023-02-26 DIAGNOSIS — I255 Ischemic cardiomyopathy: Secondary | ICD-10-CM | POA: Diagnosis present

## 2023-02-26 NOTE — Therapy (Signed)
OUTPATIENT OCCUPATIONAL THERAPY NEURO TREATMENT NOTE  Patient Name: Ricardo Yang MRN: [id number] DOB:[birth year], 47 y.o., male Today's Date: 02/26/2023  PCP: Dr. Joseph Berkshire REFERRING PROVIDER: Dr. Joseph Berkshire  END OF SESSION:  OT End of Session - 02/26/23 1232     Visit Number 8    Number of Visits 24    Date for OT Re-Evaluation 04/22/23    Authorization Time Period Reporting period beginning 01/28/23    Progress Note Due on Visit 10    OT Start Time 1130    OT Stop Time 1215    OT Time Calculation (min) 45 min    Equipment Utilized During Treatment none    Activity Tolerance Patient tolerated treatment well    Behavior During Therapy [facility] for tasks assessed/performed            Past Medical History:  Diagnosis Date   Acute CVA (cerebrovascular accident) (HCC) 10/04/2022   Acute ST elevation myocardial infarction (STEMI) involving left anterior descending (LAD) coronary artery (HCC) 08/06/2019   Anxiety 09/2022   After stroke   CAD (coronary artery disease) 10/04/2022   Cerebrovascular accident (CVA) (HCC) 10/07/2022   Chest pain    CHF (congestive heart failure) (HCC)    Cocaine abuse (HCC)    Cocaine use 10/06/2022   Depression December 2023   When stroke happened   Elevated troponin    Encounter for imaging to screen for metal prior to MRI 10/07/2022   Heavy smoker    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 question possible MS   NSTEMI (non-ST elevated myocardial infarction) (HCC) 05/25/2015   Obesity    Polysubstance abuse (HCC)    a. tobacco, cocaine, crack, ecstasy, pills, "anything but injections"   Primary hypertension 10/05/2022   Sleep apnea    Always   STEMI (ST elevation myocardial infarction) (HCC) 08/06/2019   Past Surgical History:  Procedure Laterality Date   CARDIAC CATHETERIZATION N/A 05/28/2015   Procedure: Left Heart Cath and Coronary Angiography;  Surgeon: Antonieta Iba, MD;   Location: [facility] INVASIVE CV [facility];  Service: Cardiovascular;  Laterality: N/A;   CORONARY ANGIOGRAPHY N/A 04/22/2021   Procedure: CORONARY ANGIOGRAPHY;  Surgeon: Marcina Millard, MD;  Location: [facility] INV[facility] CV [facility];  Service: Cardiovascular;  Laterality: N/A;   CORONARY/GRAFT ACUTE MI REVASCULARIZATION N/A 08/06/2019   Procedure: Coronary/Graft Acute MI Revascularization;  Surgeon: Marcina Millard, MD;  Location: [facility] INV[facility] CV [facility];  Service: Cardiovascular;  Laterality: N/A;   LEFT HEART CATH N/A 04/22/2021   Procedure: Left Heart Cath;  Surgeon: Marcina Millard, MD;  Location: [facility] INV[facility] CV [facility];  Service: Cardiovascular;  Laterality: N/A;   LEFT HEART CATH AND CORONARY ANGIOGRAPHY N/A 08/06/2019   Procedure: LEFT HEART CATH AND CORONARY ANGIOGRAPHY;  Surgeon: Marcina Millard, MD;  Location: [facility] INV[facility] CV [facility];  Service: Cardiovascular;  Laterality: N/A;   TEE WITHOUT CARDIOVERSION N/A 10/08/2022   Procedure: TRANSESOPHAGEAL ECHOCARDIOGRAM (TEE);  Surgeon: Debbe Odea, MD;  Location: [facility] ORS;  Service: Cardiovascular;  Laterality: N/A;  11:30am   Patient Active Problem List   Diagnosis Date Noted   Sleep apnea 01/01/2023   Dysfunction of right rotator cuff 01/01/2023   History of CVA with residual deficit 12/04/2022   Mixed hyperlipidemia 12/04/2022   History of MI (myocardial infarction) 12/04/2022   Insomnia 10/17/2022   Slow transit constipation 10/17/2022   Chronic pain of both knees 10/17/2022   History of substance abuse (HCC) 10/15/2022  HFrEF (heart failure with reduced ejection fraction) (HCC) 10/07/2022   Expressive aphasia 10/05/2022   Right hemiplegia (HCC) 10/05/2022   CAD S/P percutaneous coronary angioplasty 10/05/2022   Polysubstance abuse (HCC) 10/04/2022   Essential hypertension 04/22/2021   Ischemic chest pain [facility])    ONSET DATE: 10/04/22  REFERRING DIAG: I63.9 (ICD-10-CM) - CVA (cerebral vascular accident)   THERAPY DIAG:   Muscle weakness (generalized)  Other lack of coordination  Left middle cerebral artery stroke (HCC)  Rationale for Evaluation and Treatment: Rehabilitation  SUBJECTIVE:  SUBJECTIVE STATEMENT: Pt reports his R shoulder isn't feeling too bad today (2/10 pain). Pt accompanied by: self  PERTINENT HISTORY: CVA in 12/23. Multiple Cardiac caths over the last 5 years, hx of polysubstance abuse  PRECAUTIONS: None  WEIGHT BEARING RESTRICTIONS: No  PAIN:  Are you having pain? Yes: NPRS scale: 2/10 Pain location: R shoulder  Pain description: sharp and achy and burning Aggravating factors: lifting the arm Relieving factors: recent cortisone shot within 6 weeks  FALLS: Has patient fallen in last 6 months? 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    PATIENT GOALS: "If I could use my hand 75%.  And to use my hand to use the bathroom."  Pt enjoyed playing cards and shooting pool, playing video games prior to CVA.  OBJECTIVE:   HAND DOMINANCE: Right  ADLs: Overall ADLs: performing ADLs predominantly with the L non-dominant hand Transfers/ambulation related to ADLs: without AD Eating: 1 handed cutting, L hand only Grooming: electric toothbrush, L hand only UB Dressing: assist with buttons and belt  LB Dressing: assist to tie shoes; performs all L handed Toileting: using L hand for toilet hygiene and clothing management Bathing: L hand only Tub Shower transfers: indep with tub shower transfer Equipment: none  IADLs: Shopping: able to manage light shopping trips, but typically goes with a family member for the driving  assistance Light housekeeping: L hand only  Meal Prep: cold meal prep, some hot meal prep when able to use L hand only Community mobility: Pt reports he has not regularly driven since CVA, but has driven a couple of times to the store.  Ambulatory without AD. Medication management: easy open pill bottles and pt uses a pill organizer and sets up independently  Financial management: pt reports he has no current bills Handwriting:  Holds a pen loosely with R hand and can make a mark on paper but not able to write his initials   MOBILITY STATUS:  ambulatory without AD; see PT note for details.  POSTURE COMMENTS:  R sided hemiparesis Sitting balance: Moves/returns truncal midpoint >2 inches in all planes  ACTIVITY TOLERANCE: Activity tolerance: WFL for assessment, to be further assessed in future visits  FUNCTIONAL OUTCOME MEASURES: FOTO: 37 ; predicted 43  UPPER EXTREMITY ROM:    Active ROM Right eval Left Eval WNL  Shoulder flexion 95   Shoulder abduction    Shoulder adduction 68   Shoulder extension    Shoulder internal rotation Thumb to posterior hip   Shoulder external rotation Thumb to R ear with slight chin tuck and head tilt  Elbow flexion    Elbow extension    Wrist flexion    Wrist extension    Wrist ulnar deviation    Wrist radial deviation    Wrist pronation    Wrist supination    (Blank rows = not tested)  UPPER EXTREMITY MMT:     MMT Right eval Left eval  Shoulder flexion 3- 5  Shoulder abduction    Shoulder adduction 3- 5  Shoulder extension    Shoulder internal rotation 3- 5  Shoulder external rotation 3- 5  Middle trapezius    Lower trapezius    Elbow flexion 3+   Elbow extension 3+   Wrist flexion 4- 5  Wrist extension 4 5  Wrist ulnar deviation    Wrist radial deviation    Wrist pronation 3+ 5  Wrist supination 3- 5  (Blank rows = not tested)  HAND FUNCTION: Grip strength: Right: (3rd level on dynamometer) 4 lbs; Left: 108 lbs, Lateral  pinch: Right: 2 lbs, Left: 22 lbs, and 3 point pinch: Right: 0 lbs, Left: 21 lbs ([place] pinch gauge)  COORDINATION: Finger Nose Finger test: RUE with difficulty 9 Hole Peg test: Right: unable sec; Left: 31.5 sec  SENSATION: Light touch: Impaired  pins and needles down the arm   EDEMA: R hand moderately edematous: 24 cm circumferentially at the MCPs of digits 2-5 (L 22.5 cm)  MUSCLE TONE: RUE: Mild and Hypertonic  COGNITION: Overall cognitive status: Within functional limits for tasks assessed  VISION: Baseline vision: No visual deficits  VISION ASSESSMENT: WFL  PERCEPTION: WFL  PRAXIS: Impaired: Motor planning  TODAY'S TREATMENT:                                                                                                                              DATE: 02/24/23 Moist heat applied to R shoulder for pain reduction/muscle relaxation during completion of ROM noted below.    Therapeutic Exercise: In sitting performed reps of passive stretching for R wrist and digit extension, R forearm supination, R shoulder ER/IR, abd, flexion, and horiz abd/add to tolerance.  Reviewed self passive stretching and AAROM for R shoulder flex, abd, and ER with hands clasped or use of table top.  Instructed pt in passive, AAROM, and AROM for R hand digits to promote abd and extension of all digits for improving ability to open hand in prep for grasping ADL supplies.  Practiced place and hold exercises for digit extension individually and all digits together, practiced rolling a pen on table top beginning with R hand in a fist, palm down on table top, and used pen roll also for R thumb radial abd.  Practiced AAROM for R thumb MP and IP extension.  Good return demo of all hand exercises and pt able to recall and demo all at end of session.  Encouraged completion with high reps at home daily and throughout the weekend.  Reinforced need for high reps to reach functional  goals.  Pt verbalized  understanding.  Neuro re-ed: Facilitated RUE forward reaching patterns and grasp/release with the R hand to move [place] rings between 1st, 2nd, and 3rd rows of [place] tower.  Pt required initial min A to move rings from 1st to 2nd level, but able to perform with vc only after repetition.  Min A to move rings between 2nd and 3rd levels.  Pt required min A to open fingers and rotate R forearm in prep for grasping a ring, and was able to release each ring mostly by dragging fingers open against the ring when retracting arm back to lap or table top.  PATIENT EDUCATION: Education details: PROM/AAROM/AROM throughout the RUE/HEP progression Person educated: Patient Education method: Programmer, multimedia, demo Education comprehension: verbalized understanding, demonstrated understanding  HOME EXERCISE PROGRAM: Theraputty exercises, cane stretches, digit AROM/AAROM/PROM/place and hold exercises for digit extension  GOALS: Goals reviewed with patient? Yes  SHORT TERM GOALS: Target date: 03/11/23 (6 weeks)  Pt will be indep to perform HEP for improving RUE strength and coordination. Baseline: Eval: not yet initiated Goal status: INITIAL  LONG TERM GOALS: Target date: 04/22/23 (12 weeks)  Pt will increase FOTO score to 43 or better to indicate improvement in self perceived functional use of the R arm with daily tasks. Baseline: Eval: 37 Goal status: INITIAL  2.  Pt will increase R grip strength by 15 or more lbs in order to improve ability to hold and carry light ADL supplies in R dominant hand.    Baseline: Eval: R grip 4 lbs, L 108 lbs (3rd slot on dynamometer)  Goal status: INITIAL  3.  Pt will increase R lateral pinch strength to be able to manipulate playing cards with minimal dropping. Baseline: Eval: R 2 lbs (L 22 lbs); pt has not attempted playing cards since CVA Goal status: INITIAL  4.  Pt will improve FMC/dexterity skills in R hand to be able to engage the R hand to assist the L with clothing  fasteners. Baseline: Eval: R 9 hole (unable), L 31.5 sec; manages clothing fasteners with L hand only. Goal status: INITIAL  5.  Pt will increase RUE strength and coordination to be able to engage the RUE at least 50% of the time with ADLs. Baseline: Eval: manages all ADLs with the L non-dominant hand. Goal status: INITIAL  6.  Pt will sign name for documents with R dominant hand, adapted pen as needed, with >50% legibility. Baseline: Eval: Pt holds standard pen and can make marks on paper but not yet formulate letters of his name. Goal status: INITIAL  ASSESSMENT: CLINICAL IMPRESSION: Pt tolerated RUE passive stretching well this date.  R shoulder remains stiff in all planes, but steadily improving and pain decreased today at 2/10 in R shoulder.  Good tolerance with participation in [place] tower today, with pt demonstrating mostly min A to move rings between 1st, 2rd, and 3rd level of tower.  Pt required min A to open fingers and rotate R forearm in prep for grasping a ring, and was able to release each ring mostly by dragging fingers open against the ring when retracting arm back to lap or table top.  Focused on finger extension and abd exercises to promote opening the hand in prep to grasp and release ADL supplies.  Good return demo of all hand exercises and pt able to recall and demo all at end of session.  Encouraged completion with high reps at home daily and throughout the weekend.  Reinforced need for  high reps to reach functional goals.  Pt verbalized understanding.  Pt continues to present with RUE weakness, now mild edema in the R hand, GMC/FMC deficits, decreased sensation to light touch, and mild hypertonicity throughout the RUE.  Pt will continue to benefit from skilled OT to address above noted deficits in order to increase indep with ADLs and improve QOL.  PERFORMANCE DEFICITS: in functional skills including ADLs, IADLs, coordination, dexterity, sensation, edema, tone, ROM, strength,  pain, flexibility, Fine motor control, Gross motor control, balance, body mechanics, decreased knowledge of use of DME, and UE functional use, and psychosocial skills including coping strategies, habits, and routines and behaviors.   IMPAIRMENTS: are limiting patient from ADLs, IADLs, work, leisure, and social participation.   CO-MORBIDITIES: may have co-morbidities  that affects occupational performance. Patient will benefit from skilled OT to address above impairments and improve overall function.  MODIFICATION OR ASSISTANCE TO COMPLETE EVALUATION: No modification of tasks or assist necessary to complete an evaluation.  OT OCCUPATIONAL PROFILE AND HISTORY: Problem focused assessment: Including review of records relating to presenting problem.  CLINICAL DECISION MAKING: Moderate - several treatment options, min-mod task modification necessary  REHAB POTENTIAL: Good  EVALUATION COMPLEXITY: Moderate    PLAN:  OT FREQUENCY: 1-2x/week  OT DURATION: 12 weeks  PLANNED INTERVENTIONS: self care/ADL training, therapeutic exercise, therapeutic activity, neuromuscular re-education, manual therapy, passive range of motion, balance training, splinting, electrical stimulation, paraffin, moist heat, cryotherapy, contrast bath, patient/family education, coping strategies training, and DME and/or AE instructions  RECOMMENDED OTHER SERVICES: None at this time  CONSULTED AND AGREED WITH PLAN OF CARE: Patient  PLAN FOR NEXT SESSION: see above  Danelle Earthly, MS, OTR/L  Otis Dials, OT 02/26/2023, 12:43 PM

## 2023-03-03 ENCOUNTER — Encounter: Payer: Self-pay | Admitting: Family Medicine

## 2023-03-03 ENCOUNTER — Ambulatory Visit (INDEPENDENT_AMBULATORY_CARE_PROVIDER_SITE_OTHER): Payer: Medicaid Other | Admitting: Family Medicine

## 2023-03-03 ENCOUNTER — Other Ambulatory Visit: Payer: Self-pay

## 2023-03-03 ENCOUNTER — Ambulatory Visit
Admission: RE | Admit: 2023-03-03 | Discharge: 2023-03-03 | Disposition: A | Discharge status: Home / Self Care | Payer: Medicaid Other | Attending: Family Medicine | Admitting: Family Medicine

## 2023-03-03 ENCOUNTER — Ambulatory Visit: Payer: Medicaid Other | Admitting: Physical Therapy

## 2023-03-03 ENCOUNTER — Ambulatory Visit
Admission: RE | Admit: 2023-03-03 | Discharge: 2023-03-03 | Disposition: A | Discharge status: Home / Self Care | Payer: Medicaid Other | Source: Ambulatory Visit | Attending: Family Medicine | Admitting: Family Medicine

## 2023-03-03 ENCOUNTER — Ambulatory Visit: Payer: Medicaid Other | Admitting: Family

## 2023-03-03 VITALS — BP 118/78 | HR 80 | Ht 75.75 in | Wt 270.0 lb

## 2023-03-03 DIAGNOSIS — I6930 Unspecified sequelae of cerebral infarction: Secondary | ICD-10-CM | POA: Insufficient documentation

## 2023-03-03 DIAGNOSIS — M25561 Pain in right knee: Secondary | ICD-10-CM | POA: Diagnosis not present

## 2023-03-03 DIAGNOSIS — G8929 Other chronic pain: Secondary | ICD-10-CM | POA: Insufficient documentation

## 2023-03-03 DIAGNOSIS — G4730 Sleep apnea, unspecified: Secondary | ICD-10-CM

## 2023-03-03 DIAGNOSIS — I10 Essential (primary) hypertension: Secondary | ICD-10-CM

## 2023-03-03 DIAGNOSIS — M67911 Unspecified disorder of synovium and tendon, right shoulder: Secondary | ICD-10-CM | POA: Insufficient documentation

## 2023-03-03 NOTE — Assessment & Plan Note (Signed)
Chronic, well-controlled on current regimen, follows with cardiology.

## 2023-03-03 NOTE — Assessment & Plan Note (Signed)
Chronic, improved, did note benefit from prior cortisone injection which helped

## 2023-03-03 NOTE — Patient Instructions (Signed)
-   Go downstairs to get x-rays today - Coordinator will contact you to schedule MRI - We will contact you with results and next steps - Follow-up with pulmonary / sleep medicine doctor - Return for physical

## 2023-03-04 ENCOUNTER — Ambulatory Visit: Payer: Medicaid Other | Admitting: Physical Therapy

## 2023-03-04 ENCOUNTER — Other Ambulatory Visit: Payer: Self-pay

## 2023-03-04 ENCOUNTER — Encounter: Payer: Self-pay | Admitting: Physical Therapy

## 2023-03-04 ENCOUNTER — Ambulatory Visit: Payer: Medicaid Other

## 2023-03-04 DIAGNOSIS — M6281 Muscle weakness (generalized): Secondary | ICD-10-CM

## 2023-03-04 DIAGNOSIS — R278 Other lack of coordination: Secondary | ICD-10-CM

## 2023-03-04 DIAGNOSIS — R2689 Other abnormalities of gait and mobility: Secondary | ICD-10-CM

## 2023-03-04 DIAGNOSIS — I63512 Cerebral infarction due to unspecified occlusion or stenosis of left middle cerebral artery: Secondary | ICD-10-CM

## 2023-03-04 DIAGNOSIS — R2681 Unsteadiness on feet: Secondary | ICD-10-CM

## 2023-03-04 NOTE — Therapy (Signed)
OUTPATIENT OCCUPATIONAL THERAPY NEURO TREATMENT NOTE  Patient Name: Ricardo Yang MRN: [id number] DOB:1976-07-30, 47 y.o., male Today's Date: 03/04/2023  PCP: Dr. Joseph Berkshire REFERRING PROVIDER: Dr. Joseph Berkshire  END OF SESSION:  OT End of Session - 03/04/23 1151     Visit Number 9    Number of Visits 24    Date for OT Re-Evaluation 04/22/23    Authorization Time Period Reporting period beginning 01/28/23    Progress Note Due on Visit 10    OT Start Time 1015    OT Stop Time 1100    OT Time Calculation (min) 45 min    Equipment Utilized During Treatment none    Activity Tolerance Patient tolerated treatment well    Behavior During Therapy [facility] for tasks assessed/performed            Past Medical History:  Diagnosis Date   Acute CVA (cerebrovascular accident) (HCC) 10/04/2022   Acute ST elevation myocardial infarction (STEMI) involving left anterior descending (LAD) coronary artery (HCC) 08/06/2019   Anxiety 09/2022   After stroke   CAD (coronary artery disease) 10/04/2022   Cerebrovascular accident (CVA) (HCC) 10/07/2022   Chest pain    CHF (congestive heart failure) (HCC)    Cocaine abuse (HCC)    Cocaine use 10/06/2022   Depression December 2023   When stroke happened   Elevated troponin    Encounter for imaging to screen for metal prior to MRI 10/07/2022   Heavy smoker    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 question possible MS   NSTEMI (non-ST elevated myocardial infarction) (HCC) 05/25/2015   Obesity    Polysubstance abuse (HCC)    a. tobacco, cocaine, crack, ecstasy, pills, "anything but injections"   Primary hypertension 10/05/2022   Sleep apnea    Always   STEMI (ST elevation myocardial infarction) (HCC) 08/06/2019   Past Surgical History:  Procedure Laterality Date   CARDIAC CATHETERIZATION N/A 05/28/2015   Procedure: Left Heart Cath and Coronary Angiography;  Surgeon: Antonieta Iba, MD;   Location: [facility] INVASIVE CV LAB;  Service: Cardiovascular;  Laterality: N/A;   CORONARY ANGIOGRAPHY N/A 04/22/2021   Procedure: CORONARY ANGIOGRAPHY;  Surgeon: Marcina Millard, MD;  Location: [facility] INVASIVE CV [facility];  Service: Cardiovascular;  Laterality: N/A;   CORONARY/GRAFT ACUTE MI REVASCULARIZATION N/A 08/06/2019   Procedure: Coronary/Graft Acute MI Revascularization;  Surgeon: Marcina Millard, MD;  Location: [facility] INVASIVE CV [facility];  Service: Cardiovascular;  Laterality: N/A;   LEFT HEART CATH N/A 04/22/2021   Procedure: Left Heart Cath;  Surgeon: Marcina Millard, MD;  Location: [facility] INV[facility]IVE CV LAB;  Service: Cardiovascular;  Laterality: N/A;   LEFT HEART CATH AND CORONARY ANGIOGRAPHY N/A 08/06/2019   Procedure: LEFT HEART CATH AND CORONARY ANGIOGRAPHY;  Surgeon: Marcina Millard, MD;  Location: [facility] INVASIVE CV LAB;  Service: Cardiovascular;  Laterality: N/A;   TEE WITHOUT CARDIOVERSION N/A 10/08/2022   Procedure: TRANSESOPHAGEAL ECHOCARDIOGRAM (TEE);  Surgeon: Debbe Odea, MD;  Location: [facility] ORS;  Service: Cardiovascular;  Laterality: N/A;  11:30am   Patient Active Problem List   Diagnosis Date Noted   Sleep apnea 01/01/2023   Dysfunction of right rotator cuff 01/01/2023   History of CVA with residual deficit 12/04/2022   Mixed hyperlipidemia 12/04/2022   History of MI (myocardial infarction) 12/04/2022   Insomnia 10/17/2022   Slow transit constipation 10/17/2022   Chronic pain of both knees 10/17/2022   History of substance abuse (HCC) 10/15/2022  HFrEF (heart failure with reduced ejection fraction) (HCC) 10/07/2022   Expressive aphasia 10/05/2022   Right hemiplegia (HCC) 10/05/2022   CAD S/P percutaneous coronary angioplasty 10/05/2022   Polysubstance abuse (HCC) 10/04/2022   Essential hypertension 04/22/2021   Ischemic chest pain [facility])    ONSET DATE: 10/04/22  REFERRING DIAG: I63.9 (ICD-10-CM) - CVA (cerebral vascular accident)   THERAPY DIAG:   Muscle weakness (generalized)  Other lack of coordination  Left middle cerebral artery stroke (HCC)  Rationale for Evaluation and Treatment: Rehabilitation  SUBJECTIVE:  SUBJECTIVE STATEMENT: Pt reports he's been working on his finger extension consistently since last session.  Pt accompanied by: self  PERTINENT HISTORY: CVA in 12/23. Multiple Cardiac caths over the last 5 years, hx of polysubstance abuse  PRECAUTIONS: None  WEIGHT BEARING RESTRICTIONS: No  PAIN:  Are you having pain? Yes: NPRS scale: 3/10 Pain location: R shoulder  Pain description: sharp and achy and burning Aggravating factors: lifting the arm Relieving factors: recent cortisone shot within 6 weeks  FALLS: Has patient fallen in last 6 months? 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    PATIENT GOALS: "If I could use my hand 75%.  And to use my hand to use the bathroom."  Pt enjoyed playing cards and shooting pool, playing video games prior to CVA.  OBJECTIVE:   HAND DOMINANCE: Right  ADLs: Overall ADLs: performing ADLs predominantly with the L non-dominant hand Transfers/ambulation related to ADLs: without AD Eating: 1 handed cutting, L hand only Grooming: electric toothbrush, L hand only UB Dressing: assist with buttons and belt  LB Dressing: assist to tie shoes; performs all L handed Toileting: using L hand for toilet hygiene and clothing management Bathing: L hand only Tub Shower transfers: indep with tub shower transfer Equipment: none  IADLs: Shopping: able to manage light shopping trips, but typically goes with a family  member for the driving assistance Light housekeeping: L hand only  Meal Prep: cold meal prep, some hot meal prep when able to use L hand only Community mobility: Pt reports he has not regularly driven since CVA, but has driven a couple of times to the store.  Ambulatory without AD. Medication management: easy open pill bottles and pt uses a pill organizer and sets up independently  Financial management: pt reports he has no current bills Handwriting:  Holds a pen loosely with R hand and can make a mark on paper but not able to write his initials   MOBILITY STATUS:  ambulatory without AD; see PT note for details.  POSTURE COMMENTS:  R sided hemiparesis Sitting balance: Moves/returns truncal midpoint >2 inches in all planes  ACTIVITY TOLERANCE: Activity tolerance: WFL for assessment, to be further assessed in future visits  FUNCTIONAL OUTCOME MEASURES: FOTO: 37 ; predicted 43  UPPER EXTREMITY ROM:    Active ROM Right eval Left Eval WNL  Shoulder flexion 95   Shoulder abduction    Shoulder adduction 68   Shoulder extension    Shoulder internal rotation Thumb to posterior hip   Shoulder external rotation Thumb to R ear with slight chin tuck and  head tilt   Elbow flexion    Elbow extension    Wrist flexion    Wrist extension    Wrist ulnar deviation    Wrist radial deviation    Wrist pronation    Wrist supination    (Blank rows = not tested)  UPPER EXTREMITY MMT:     MMT Right eval Left eval  Shoulder flexion 3- 5  Shoulder abduction    Shoulder adduction 3- 5  Shoulder extension    Shoulder internal rotation 3- 5  Shoulder external rotation 3- 5  Middle trapezius    Lower trapezius    Elbow flexion 3+   Elbow extension 3+   Wrist flexion 4- 5  Wrist extension 4 5  Wrist ulnar deviation    Wrist radial deviation    Wrist pronation 3+ 5  Wrist supination 3- 5  (Blank rows = not tested)  HAND FUNCTION: Grip strength: Right: (3rd level on dynamometer) 4 lbs;  Left: 108 lbs, Lateral pinch: Right: 2 lbs, Left: 22 lbs, and 3 point pinch: Right: 0 lbs, Left: 21 lbs (Saehan pinch gauge)  COORDINATION: Finger Nose Finger test: RUE with difficulty 9 Hole Peg test: Right: unable sec; Left: 31.5 sec  SENSATION: Light touch: Impaired  pins and needles down the arm   EDEMA: R hand moderately edematous: 24 cm circumferentially at the MCPs of digits 2-5 (L 22.5 cm)  MUSCLE TONE: RUE: Mild and Hypertonic  COGNITION: Overall cognitive status: Within functional limits for tasks assessed  VISION: Baseline vision: No visual deficits  VISION ASSESSMENT: WFL  PERCEPTION: WFL  PRAXIS: Impaired: Motor planning  TODAY'S TREATMENT:                                                                                                                              DATE: 02/24/23 Moist heat applied to R shoulder for pain reduction/muscle relaxation during completion of therapeutic exercises noted below.    Therapeutic Exercise: In sitting performed reps of passive stretching for R wrist and digit extension, R forearm supination, R shoulder ER/IR, abd, flexion, and horiz abd/add to tolerance.  Facilitated R hand digit extension and lateral pinch strengthening working to remove yellow, red, and green clothespins from a vertical dowel placed on table top.  Pt was given initial cues to maintain back against back rest of chair and focus on elbow extension  and forward flexion to reach for the clips and avoid a forward lean; pt able to demo and maintain body mechanics after initial cues.  Further facilitated forward and lateral reaching patterns and wrist and digit extension working to grasp clips on table top and release back into container.  Pt initiated self passive digit and wrist extension on table top intermittently throughout session to reduce flexor tone in hand.  Facilitated active R wrist flex/ext and forearm pron/sup working to grasp cone and move cone through these  positions without dropping.  Pt requires manual assist to  maximize forearm supination d/t increased tone.  PATIENT EDUCATION: Education details: active wrist and digit extension exercises for improved ability to release items from hand; self passive supination stretch Person educated: Patient Education method: Explanation, demo Education comprehension: verbalized understanding, demonstrated understanding  HOME EXERCISE PROGRAM: Theraputty exercises, cane stretches, digit AROM/AAROM/PROM/place and hold exercises for digit extension  GOALS: Goals reviewed with patient? Yes  SHORT TERM GOALS: Target date: 03/11/23 (6 weeks)  Pt will be indep to perform HEP for improving RUE strength and coordination. Baseline: Eval: not yet initiated Goal status: INITIAL  LONG TERM GOALS: Target date: 04/22/23 (12 weeks)  Pt will increase FOTO score to 43 or better to indicate improvement in self perceived functional use of the R arm with daily tasks. Baseline: Eval: 37 Goal status: INITIAL  2.  Pt will increase R grip strength by 15 or more lbs in order to improve ability to hold and carry light ADL supplies in R dominant hand.    Baseline: Eval: R grip 4 lbs, L 108 lbs (3rd slot on dynamometer)  Goal status: INITIAL  3.  Pt will increase R lateral pinch strength to be able to manipulate playing cards with minimal dropping. Baseline: Eval: R 2 lbs (L 22 lbs); pt has not attempted playing cards since CVA Goal status: INITIAL  4.  Pt will improve FMC/dexterity skills in R hand to be able to engage the R hand to assist the L with clothing fasteners. Baseline: Eval: R 9 hole (unable), L 31.5 sec; manages clothing fasteners with L hand only. Goal status: INITIAL  5.  Pt will increase RUE strength and coordination to be able to engage the RUE at least 50% of the time with ADLs. Baseline: Eval: manages all ADLs with the L non-dominant hand. Goal status: INITIAL  6.  Pt will sign name for documents with  R dominant hand, adapted pen as needed, with >50% legibility. Baseline: Eval: Pt holds standard pen and can make marks on paper but not yet formulate letters of his name. Goal status: INITIAL  ASSESSMENT: CLINICAL IMPRESSION: Pt reported that he had an MD appointment yesterday and MD is scheduling MRI for chronic R shoulder pain sometime next week, date not yet confirmed.  Pt reported 3/10 pain in the R shoulder today and responded well to moist heat and passive stretching.  Pt is demonstrating increased tolerance for passive shoulder flexion with reduced pain (pt acknowledged no wincing).  R shoulder ER/IR remains quite stiff and limits ability to reach behind head and back during ADLs, but pt tolerated passive stretching well this date for all shoulder planes.  Pt reports he's been working on his finger extension consistently since last session and demonstrated great improvement with ability to release clothespins from hand today without OT assist. Pt continues to present with RUE weakness, still mild edema in the R hand, GMC/FMC deficits, decreased sensation to light touch, and mild hypertonicity throughout the RUE.  Pt will continue to benefit from skilled OT to address above noted deficits in order to increase indep with ADLs and improve QOL.  PERFORMANCE DEFICITS: in functional skills including ADLs, IADLs, coordination, dexterity, sensation, edema, tone, ROM, strength, pain, flexibility, Fine motor control, Gross motor control, balance, body mechanics, decreased knowledge of use of DME, and UE functional use, and psychosocial skills including coping strategies, habits, and routines and behaviors.   IMPAIRMENTS: are limiting patient from ADLs, IADLs, work, leisure, and social participation.   CO-MORBIDITIES: may have co-morbidities  that affects occupational  performance. Patient will benefit from skilled OT to address above impairments and improve overall function.  MODIFICATION OR ASSISTANCE TO  COMPLETE EVALUATION: No modification of tasks or assist necessary to complete an evaluation.  OT OCCUPATIONAL PROFILE AND HISTORY: Problem focused assessment: Including review of records relating to presenting problem.  CLINICAL DECISION MAKING: Moderate - several treatment options, min-mod task modification necessary  REHAB POTENTIAL: Good  EVALUATION COMPLEXITY: Moderate    PLAN:  OT FREQUENCY: 1-2x/week  OT DURATION: 12 weeks  PLANNED INTERVENTIONS: self care/ADL training, therapeutic exercise, therapeutic activity, neuromuscular re-education, manual therapy, passive range of motion, balance training, splinting, electrical stimulation, paraffin, moist heat, cryotherapy, contrast bath, patient/family education, coping strategies training, and DME and/or AE instructions  RECOMMENDED OTHER SERVICES: None at this time  CONSULTED AND AGREED WITH PLAN OF CARE: Patient  PLAN FOR NEXT SESSION: see above  Danelle Earthly, MS, OTR/L  Otis Dials, OT 03/04/2023, 11:52 AM

## 2023-03-04 NOTE — Telephone Encounter (Signed)
Request for recommendations to hold Plavix and aspirin for upcoming extraction of 4 teeth.   Thank you!  DW

## 2023-03-04 NOTE — Therapy (Signed)
OUTPATIENT PHYSICAL THERAPY NEURO TREATMENT   Patient Name: Ricardo Yang MRN: [id number] DOB:Mar 15, 1976, 47 y.o., male Today's Date: 03/04/2023   PCP: Jerrol Banana  REFERRING PROVIDER: Jerrol Banana   END OF SESSION:  PT End of Session - 03/04/23 0918     Visit Number 3    Number of Visits 24    Date for PT Re-Evaluation 04/07/23    Progress Note Due on Visit 10    PT Start Time 0931    PT Stop Time 1014    PT Time Calculation (min) 43 min    Equipment Utilized During Treatment Gait belt    Activity Tolerance Patient tolerated treatment well    Behavior During Therapy [facility] for tasks assessed/performed              Past Medical History:  Diagnosis Date   Acute CVA (cerebrovascular accident) (HCC) 10/04/2022   Acute ST elevation myocardial infarction (STEMI) involving left anterior descending (LAD) coronary artery (HCC) 08/06/2019   Anxiety 09/2022   After stroke   CAD (coronary artery disease) 10/04/2022   Cerebrovascular accident (CVA) (HCC) 10/07/2022   Chest pain    CHF (congestive heart failure) (HCC)    Cocaine abuse (HCC)    Cocaine use 10/06/2022   Depression December 2023   When stroke happened   Elevated troponin    Encounter for imaging to screen for metal prior to MRI 10/07/2022   Heavy smoker    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 question possible MS   NSTEMI (non-ST elevated myocardial infarction) (HCC) 05/25/2015   Obesity    Polysubstance abuse (HCC)    a. tobacco, cocaine, crack, ecstasy, pills, "anything but injections"   Primary hypertension 10/05/2022   Sleep apnea    Always   STEMI (ST elevation myocardial infarction) (HCC) 08/06/2019   Past Surgical History:  Procedure Laterality Date   CARDIAC CATHETERIZATION N/A 05/28/2015   Procedure: Left Heart Cath and Coronary Angiography;  Surgeon: Antonieta Iba, MD;  Location: [facility] INVASIVE CV LAB;  Service: Cardiovascular;   Laterality: N/A;   CORONARY ANGIOGRAPHY N/A 04/22/2021   Procedure: CORONARY ANGIOGRAPHY;  Surgeon: Marcina Millard, MD;  Location: [facility] INVASIVE CV LAB;  Service: Cardiovascular;  Laterality: N/A;   CORONARY/GRAFT ACUTE MI REVASCULARIZATION N/A 08/06/2019   Procedure: Coronary/Graft Acute MI Revascularization;  Surgeon: Marcina Millard, MD;  Location: [facility] INVASIVE CV LAB;  Service: Cardiovascular;  Laterality: N/A;   LEFT HEART CATH N/A 04/22/2021   Procedure: Left Heart Cath;  Surgeon: Marcina Millard, MD;  Location: [facility] INVASIVE CV LAB;  Service: Cardiovascular;  Laterality: N/A;   LEFT HEART CATH AND CORONARY ANGIOGRAPHY N/A 08/06/2019   Procedure: LEFT HEART CATH AND CORONARY ANGIOGRAPHY;  Surgeon: Marcina Millard, MD;  Location: [facility] INVASIVE CV LAB;  Service: Cardiovascular;  Laterality: N/A;   TEE WITHOUT CARDIOVERSION N/A 10/08/2022   Procedure: TRANSESOPHAGEAL ECHOCARDIOGRAM (TEE);  Surgeon: Debbe Odea, MD;  Location: [facility] ORS;  Service: Cardiovascular;  Laterality: N/A;  11:30am   Patient Active Problem List   Diagnosis Date Noted   Sleep apnea 01/01/2023   Dysfunction of right rotator cuff 01/01/2023   History of CVA with residual deficit 12/04/2022   Mixed hyperlipidemia 12/04/2022   History of MI (myocardial infarction) 12/04/2022   Insomnia 10/17/2022   Slow transit constipation 10/17/2022   Chronic pain of both knees 10/17/2022   History of substance abuse (HCC) 10/15/2022   HFrEF (heart failure  with reduced ejection fraction) (HCC) 10/07/2022   Expressive aphasia 10/05/2022   Right hemiplegia (HCC) 10/05/2022   CAD S/P percutaneous coronary angioplasty 10/05/2022   Polysubstance abuse (HCC) 10/04/2022   Essential hypertension 04/22/2021   Ischemic chest pain [facility])     ONSET DATE: 09/2022  REFERRING DIAG: CVA of ACA, left MCA and likely of the right PCA territory with a large infarct involving the left postcentral gyrus.   THERAPY  DIAG:  Unsteadiness on feet  Balance disorder  Left middle cerebral artery stroke [facility])  Rationale for Evaluation and Treatment: Rehabilitation  SUBJECTIVE:                                                                                                                                                                                             SUBJECTIVE STATEMENT: Pt is doing well.  No significant changes since previous session and no falls noted.  He reports his right upper extremity continues to be the main source of his impairments.  Pt accompanied by: self  PERTINENT HISTORY: CVA in 12/23. Multiple Cardiac caths over the last 5 years   PAIN:  Are you having pain? Yes: NPRS scale: 3/10 Pain location: R shoulder Pain description: pulling Aggravating factors: movement Relieving factors: heat and pain meds  PRECAUTIONS: Fall  WEIGHT BEARING RESTRICTIONS: No  FALLS: Has patient fallen in last 6 months? No  LIVING ENVIRONMENT: Lives with: lives alone and with room mate Lives in: House/apartment Stairs: Yes: External: 5 steps; on right going up and on left going up Has following equipment at home: None  PLOF: Independent and Independent with basic ADLs  PATIENT GOALS: put on pants with buckle. Improved balance and safety with gait   OBJECTIVE:   DIAGNOSTIC FINDINGS:  EXAM: Right UPPER EXTREMITY VENOUS DOPPLER ULTRASOUND IMPRESSION: No evidence of DVT within the right upper extremity.  EXAM: MRI HEAD WITHOUT CONTRAST IMPRESSION: 1. Acute infarcts in the left ACA, left MCA, and likely the right PCA territory, with a large infarct involving the left postcentral gyrus. Given distribution, these are favored to represent infarcts related to a central embolic etiology. There is a focal region in the right occipital lobe where there is likely a small amount of subarachnoid blood products, which could suggest an underlying component of vasculitis. Recommend further  evaluation with echocardiography. 2. Redemonstrated periventricular and subcortical T2/FLAIR hyperintense signal, as well as at the root entry zone of the right trigeminal nerve, which are suggestive of an underlying demyelinating disease.  COGNITION: Overall cognitive status: Within functional limits for tasks assessed   SENSATION: Light touch: Impaired  RUE   COORDINATION: Ankle  to knee WFL BLE Finger to nose. Dysmetria and hemiplegia on the RUE     MUSCLE TONE: RLE: Within functional limits  MUSCLE LENGTH: Hamstrings: Right WFL ; Left WFL  Thomas test: Right WFL; Left WFL   DTRs:  Not tested  POSTURE: rounded shoulders, forward head, and anterior pelvic tilt  LOWER EXTREMITY ROM:     Active  Right Eval Left Eval  Hip flexion    Hip extension    Hip abduction    Hip adduction    Hip internal rotation    Hip external rotation    Knee flexion    Knee extension    Ankle dorsiflexion    Ankle plantarflexion    Ankle inversion    Ankle eversion     (Blank rows = not tested)  LOWER EXTREMITY MMT:    MMT Right Eval Left Eval  Hip flexion 4- 4-  Hip extension 4 4  Hip abduction 4+ 4+  Hip adduction 5 5  Hip internal rotation    Hip external rotation    Knee flexion 4+ 4+  Knee extension 5 5  Ankle dorsiflexion 4 4-  Ankle plantarflexion 4 4  Ankle inversion    Ankle eversion    (Blank rows = not tested)  BED MOBILITY:  Sit to supine Complete Independence Supine to sit Complete Independence  TRANSFERS: Assistive device utilized: None  Sit to stand: Complete Independence Stand to sit: Complete Independence Chair to chair: Complete Independence Floor: SBA  RAMP:  Level of Assistance: Complete Independence Assistive device utilized: None Ramp Comments: mild foot drop on the LLE   CURB:  Level of Assistance: Complete Independence Assistive device utilizeNoned:  Curb Comments: mild foot drop noted on the LLE   STAIRS: Level of Assistance:  Modified independence Stair Negotiation Technique: Alternating Pattern  with No Rails Number of Stairs: 4  Height of Stairs: 6  Comments: foot drop noted on the LLE. But no LOB on this day  GAIT: Gait pattern: decreased arm swing- Right and decreased ankle dorsiflexion- Left Distance walked: 150 Assistive device utilized: None Level of assistance: Complete Independence Comments: foot drop on the LLE. Pt reports that this is a chronic issue  FUNCTIONAL TESTS:  5 times sit to stand: 19.23 with UE support, 19.82 with UE pushing from thighs  Timed up and go (TUG): 15.69 6 minute walk test: 1066 ft 10 meter walk test: 11.6 normal and 9.5 fast  Functional gait assessment: 24  PATIENT SURVEYS:  FOTO 68   TODAY'S TREATMENT:                                                                                                                              DATE: 03/04/2023  Nustep LE only level 1 x 2 min, level 3 x 2 min, level 1 x 1 min   Standing at rail.  partial squat with BUE supported 2 x15  Calf raise 2x15 on 20 degree  incline Toe rase 2 x 15  NMR  Side step on airex beam x 5 rounds ea direction  Tandem walk on airex beam with turn around on airex pads x 5 rounds, good balance responses.   Hurdle step over 2 sets of 10 on each lower extremity with no upper extremity support and with contact-guard assist from therapist      PATIENT EDUCATION: Education details: POC. Rehab potential. Goals.   Person educated: Patient Education method: Explanation Education comprehension: verbalized understanding  HOME EXERCISE PROGRAM: To be given on next appointment.   GOALS: Goals reviewed with patient? Yes  SHORT TERM GOALS: Target date: 02/24/2023   Pt will be independent with HEP  Baseline: to be given next visit Goal status: INITIAL   LONG TERM GOALS: Target date: 04/07/2023   Pt will improved FGA >6 point to indicate improved function and reduced fall risk.  Baseline:  24 Goal status: INITIAL  2.  Pt will improve FOTO score to 71 to indicate improved balance, improved function with ADLs and reduced fall risk  Baseline: 68 Goal status: INITIAL  3.  Pt will improve 5xSTS <15 sec without UE SUpport to indicate reduced fall risk and  Baseline: 19.2 Goal status: INITIAL  4.  Pt will improve TUG <13 sec to indicate reduced fall risk and improved community mobility.  Baseline: 15.69 Goal status: INITIAL  5.  Pt will increase walk test >1270ft to indicate improved mobility in community setting, improved aerobic capacity and reduced fall risk.  Baseline: to be completed  Goal status: INITIAL  ASSESSMENT:  CLINICAL IMPRESSION:  Patient presents with good motivation for completion of physical therapy activities.  Patient has been doing a lot of walking this time outside of therapy and this is correlated well with improvement in his overall function.  Patient shows improved balance responses in comparison with notes from previous session showing increased ease and less upper extremity support with more challenging and dynamic balance tasks as well as good foot clearance with other activities.  Will continue to challenge patient in future sessions appropriately.  The pt will benefit from further skilled PT to address these deficits in order to improve functional mobility, QOL and to reduce fall risk.    OBJECTIVE IMPAIRMENTS: Abnormal gait, decreased activity tolerance, decreased balance, decreased coordination, decreased endurance, difficulty walking, decreased strength, and decreased safety awareness.   ACTIVITY LIMITATIONS: lifting, bending, squatting, transfers, and locomotion level  PARTICIPATION LIMITATIONS: cleaning, laundry, and driving  PERSONAL FACTORS: Age, Education, Past/current experiences, and Social background are also affecting patient's functional outcome.   REHAB POTENTIAL: Good  CLINICAL DECISION MAKING:  Stable/uncomplicated  EVALUATION COMPLEXITY: Moderate  PLAN:  PT FREQUENCY: 1-2x/week  PT DURATION: 12 weeks  PLANNED INTERVENTIONS: Therapeutic exercises, Therapeutic activity, Neuromuscular re-education, Balance training, Gait training, Patient/Family education, Self Care, Joint mobilization, Stair training, Orthotic/Fit training, and DME instructions  PLAN FOR NEXT SESSION: high level balance training. provide and update HEP. Incorporate use of RUE with functional movement.    Norman Herrlich, PT 03/04/2023, 4:48 PM

## 2023-03-05 ENCOUNTER — Ambulatory Visit: Payer: Medicaid Other | Admitting: Physical Therapy

## 2023-03-05 NOTE — Telephone Encounter (Signed)
   Patient Name: Ricardo Yang  DOB: 22-Sep-1976 MRN: [id number]  Primary Cardiologist: Debbe Odea, MD  Chart reviewed as part of pre-operative protocol coverage. Pre-op clearance already addressed by colleagues in earlier phone notes. To summarize recommendations:  -Okay to hold aspirin and Plavix.  -Dr. Azucena Cecil  Will route this bundled recommendation to requesting provider via Epic fax function and remove from pre-op pool. Please call with questions.  Sharlene Dory, PA-C 03/05/2023, 5:02 PM

## 2023-03-06 ENCOUNTER — Ambulatory Visit: Payer: Medicaid Other

## 2023-03-06 DIAGNOSIS — M6281 Muscle weakness (generalized): Secondary | ICD-10-CM

## 2023-03-06 DIAGNOSIS — R278 Other lack of coordination: Secondary | ICD-10-CM

## 2023-03-06 DIAGNOSIS — I63512 Cerebral infarction due to unspecified occlusion or stenosis of left middle cerebral artery: Secondary | ICD-10-CM

## 2023-03-06 NOTE — Therapy (Unsigned)
OUTPATIENT OCCUPATIONAL THERAPY NEURO TREATMENT NOTE  Patient Name: Ricardo Yang MRN: [id number] DOB:[birth year], 47 y.o., male Today's Date: 03/06/2023  PCP: Dr. Joseph Berkshire REFERRING PROVIDER: Dr. Joseph Berkshire  END OF SESSION:  OT End of Session - 03/06/23 1107     Visit Number 10    Number of Visits 24    Date for OT Re-Evaluation 04/22/23    Authorization Time Period Reporting period beginning 01/28/23    Progress Note Due on Visit 10    OT Start Time 1045    OT Stop Time 1130    OT Time Calculation (min) 45 min    Equipment Utilized During Treatment none    Activity Tolerance Patient tolerated treatment well    Behavior During Therapy [facility] for tasks assessed/performed            Past Medical History:  Diagnosis Date   Acute CVA (cerebrovascular accident) (HCC) 10/04/2022   Acute ST elevation myocardial infarction (STEMI) involving left anterior descending (LAD) coronary artery (HCC) 08/06/2019   Anxiety 09/2022   After stroke   CAD (coronary artery disease) 10/04/2022   Cerebrovascular accident (CVA) (HCC) 10/07/2022   Chest pain    CHF (congestive heart failure) (HCC)    Cocaine abuse (HCC)    Cocaine use 10/06/2022   Depression December 2023   When stroke happened   Elevated troponin    Encounter for imaging to screen for metal prior to MRI 10/07/2022   Heavy smoker    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 question possible MS   NSTEMI (non-ST elevated myocardial infarction) (HCC) 05/25/2015   Obesity    Polysubstance abuse (HCC)    a. tobacco, cocaine, crack, ecstasy, pills, "anything but injections"   Primary hypertension 10/05/2022   Sleep apnea    Always   STEMI (ST elevation myocardial infarction) (HCC) 08/06/2019   Past Surgical History:  Procedure Laterality Date   CARDIAC CATHETERIZATION N/A 05/28/2015   Procedure: Left Heart Cath and Coronary Angiography;  Surgeon: Antonieta Iba, MD;   Location: [facility] INVASIVE CV [facility];  Service: Cardiovascular;  Laterality: N/A;   CORONARY ANGIOGRAPHY N/A 04/22/2021   Procedure: CORONARY ANGIOGRAPHY;  Surgeon: Marcina Millard, MD;  Location: [facility] CV [facility];  Service: Cardiovascular;  Laterality: N/A;   CORONARY/GRAFT ACUTE MI REVASCULARIZATION N/A 08/06/2019   Procedure: Coronary/Graft Acute MI Revascularization;  Surgeon: Marcina Millard, MD;  Location: [facility];  Service: Cardiovascular;  Laterality: N/A;   LEFT HEART CATH N/A 04/22/2021   Procedure: Left Heart Cath;  Surgeon: Marcina Millard, MD;  Location: [facility] CV [facility];  Service: Cardiovascular;  Laterality: N/A;   LEFT HEART CATH AND CORONARY ANGIOGRAPHY N/A 08/06/2019   Procedure: LEFT HEART CATH AND CORONARY ANGIOGRAPHY;  Surgeon: Marcina Millard, MD;  Location: [facility] INV[facility] CV [facility];  Service: Cardiovascular;  Laterality: N/A;   TEE WITHOUT CARDIOVERSION N/A 10/08/2022   Procedure: TRANSESOPHAGEAL ECHOCARDIOGRAM (TEE);  Surgeon: Debbe Odea, MD;  Location: [facility] ORS;  Service: Cardiovascular;  Laterality: N/A;  11:30am   Patient Active Problem List   Diagnosis Date Noted   Sleep apnea 01/01/2023   Dysfunction of right rotator cuff 01/01/2023   History of CVA with residual deficit 12/04/2022   Mixed hyperlipidemia 12/04/2022   History of MI (myocardial infarction) 12/04/2022   Insomnia 10/17/2022   Slow transit constipation 10/17/2022   Chronic pain of both knees 10/17/2022   History of substance abuse (HCC) 10/15/2022  HFrEF (heart failure with reduced ejection fraction) (HCC) 10/07/2022   Expressive aphasia 10/05/2022   Right hemiplegia (HCC) 10/05/2022   CAD S/P percutaneous coronary angioplasty 10/05/2022   Polysubstance abuse (HCC) 10/04/2022   Essential hypertension 04/22/2021   Ischemic chest pain [facility])    ONSET DATE: 10/04/22  REFERRING DIAG: I63.9 (ICD-10-CM) - CVA (cerebral vascular accident)   THERAPY DIAG:   Muscle weakness (generalized)  Other lack of coordination  Left middle cerebral artery stroke (HCC)  Rationale for Evaluation and Treatment: Rehabilitation  SUBJECTIVE:  SUBJECTIVE STATEMENT: Pt reports he's been working on his finger extension consistently since last session.  Pt accompanied by: self  PERTINENT HISTORY: CVA in 12/23. Multiple Cardiac caths over the last 5 years, hx of polysubstance abuse  PRECAUTIONS: None  WEIGHT BEARING RESTRICTIONS: No  PAIN:  Are you having pain? Yes: NPRS scale: 1/10 Pain location: R shoulder  Pain description: sharp and achy and burning Aggravating factors: lifting the arm Relieving factors: recent cortisone shot within 6 weeks  FALLS: Has patient fallen in last 6 months? 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    PATIENT GOALS: "If I could use my hand 75%.  And to use my hand to use the bathroom."  Pt enjoyed playing cards and shooting pool, playing video games prior to CVA.  OBJECTIVE:   HAND DOMINANCE: Right  ADLs: Overall ADLs: performing ADLs predominantly with the L non-dominant hand Transfers/ambulation related to ADLs: without AD Eating: 1 handed cutting, L hand only Grooming: electric toothbrush, L hand only UB Dressing: assist with buttons and belt  LB Dressing: assist to tie shoes; performs all L handed Toileting: using L hand for toilet hygiene and clothing management Bathing: L hand only Tub Shower transfers: indep with tub shower transfer Equipment: none  IADLs: Shopping: able to manage light shopping trips, but typically goes with a family  member for the driving assistance Light housekeeping: L hand only  Meal Prep: cold meal prep, some hot meal prep when able to use L hand only Community mobility: Pt reports he has not regularly driven since CVA, but has driven a couple of times to the store.  Ambulatory without AD. Medication management: easy open pill bottles and pt uses a pill organizer and sets up independently  Financial management: pt reports he has no current bills Handwriting:  Holds a pen loosely with R hand and can make a mark on paper but not able to write his initials   MOBILITY STATUS:  ambulatory without AD; see PT note for details.  POSTURE COMMENTS:  R sided hemiparesis Sitting balance: Moves/returns truncal midpoint >2 inches in all planes  ACTIVITY TOLERANCE: Activity tolerance: WFL for assessment, to be further assessed in future visits  FUNCTIONAL OUTCOME MEASURES: FOTO: 37 ; predicted 43  UPPER EXTREMITY ROM:    Active ROM Right eval Left Eval WNL  Shoulder flexion 95 125  Shoulder abduction    Shoulder adduction 68 118  Shoulder extension    Shoulder internal rotation Thumb to posterior hip Thumb to mid lower back  Shoulder external rotation Thumb to R ear with  slight chin tuck and head tilt Thumb to posterior R ear without chin tuck or head tilt  Elbow flexion    Elbow extension    Wrist flexion    Wrist extension    Wrist ulnar deviation    Wrist radial deviation    Wrist pronation    Wrist supination    (Blank rows = not tested)  UPPER EXTREMITY MMT:     MMT Right eval Right 03/06/23 Left eval  Shoulder flexion 3- 3+ 5  Shoulder abduction     Shoulder adduction 3- 3+ 5  Shoulder extension     Shoulder internal rotation 3- 4- 5  Shoulder external rotation 3- 3+ 5  Middle trapezius     Lower trapezius     Elbow flexion 3+ 4   Elbow extension 3+ 4   Wrist flexion 4- 4 5  Wrist extension 4 4+ 5  Wrist ulnar deviation     Wrist radial deviation     Wrist pronation 3+ 4 5   Wrist supination 3- 3+ 5  (Blank rows = not tested)  HAND FUNCTION: Grip strength: Right: (3rd level on dynamometer) 4 lbs; Left: 108 lbs, Lateral pinch: Right: 2 lbs, Left: 22 lbs, and 3 point pinch: Right: 0 lbs, Left: 21 lbs (Saehan pinch gauge) 03/06/23: Grip strength: Right: (3rd level on dynamometer) 8 lbs; Left: 105 lbs, Lateral pinch: Right: 6 lbs, Left: 27 lbs, and 3 point pinch: Right: 4 lbs, Left: 17 lbs  COORDINATION: Finger Nose Finger test: RUE with difficulty 9 Hole Peg test: Right: unable sec; Left: 31.5 sec 03/06/23: R: unable; was able to remove a peg to place back into dish  SENSATION: Light touch: Impaired  pins and needles down the arm   EDEMA: R hand moderately edematous: 24 cm circumferentially at the MCPs of digits 2-5 (L 22.5 cm) 03/06/23: 23.3 cm  MUSCLE TONE: RUE: Mild and Hypertonic  COGNITION: Overall cognitive status: Within functional limits for tasks assessed  VISION: Baseline vision: No visual deficits  VISION ASSESSMENT: WFL  PERCEPTION: WFL  PRAXIS: Impaired: Motor planning  TODAY'S TREATMENT:                                                                                                                              DATE: 02/24/23 Moist heat applied to R shoulder for pain reduction/muscle relaxation during completion of therapeutic exercises noted below.    Therapeutic Exercise: In sitting performed reps of passive stretching for R wrist and digit extension, R forearm supination, R shoulder ER/IR, abd, flexion, and horiz abd/add to tolerance.  Facilitated R hand digit extension and lateral pinch strengthening working to remove yellow, red, and green clothespins from a vertical dowel placed on table top.  Pt was given initial cues to maintain back against back rest of chair and focus on elbow extension  and forward flexion to reach for the clips and avoid a forward lean; pt able to demo and  maintain body mechanics after initial cues.  Further  facilitated forward and lateral reaching patterns and wrist and digit extension working to grasp clips on table top and release back into container.  Pt initiated self passive digit and wrist extension on table top intermittently throughout session to reduce flexor tone in hand.  Facilitated active R wrist flex/ext and forearm pron/sup working to grasp cone and move cone through these positions without dropping.  Pt requires manual assist to maximize forearm supination d/t increased tone.  PATIENT EDUCATION: Education details: active wrist and digit extension exercises for improved ability to release items from hand; self passive supination stretch Person educated: Patient Education method: Explanation, demo Education comprehension: verbalized understanding, demonstrated understanding  HOME EXERCISE PROGRAM: Theraputty exercises, cane stretches, digit AROM/AAROM/PROM/place and hold exercises for digit extension  GOALS: Goals reviewed with patient? Yes  SHORT TERM GOALS: Target date: 03/11/23 (6 weeks)  Pt will be indep to perform HEP for improving RUE strength and coordination. Baseline: Eval: not yet initiated Goal status: INITIAL  LONG TERM GOALS: Target date: 04/22/23 (12 weeks)  Pt will increase FOTO score to 43 or better to indicate improvement in self perceived functional use of the R arm with daily tasks. Baseline: Eval: 37 Goal status: INITIAL  2.  Pt will increase R grip strength by 15 or more lbs in order to improve ability to hold and carry light ADL supplies in R dominant hand.    Baseline: Eval: R grip 4 lbs, L 108 lbs (3rd slot on dynamometer)  Goal status: INITIAL  3.  Pt will increase R lateral pinch strength to be able to manipulate playing cards with minimal dropping. Baseline: Eval: R 2 lbs (L 22 lbs); pt has not attempted playing cards since CVA Goal status: INITIAL  4.  Pt will improve FMC/dexterity skills in R hand to be able to engage the R hand to assist the L  with clothing fasteners. Baseline: Eval: R 9 hole (unable), L 31.5 sec; manages clothing fasteners with L hand only. Goal status: INITIAL  5.  Pt will increase RUE strength and coordination to be able to engage the RUE at least 50% of the time with ADLs. Baseline: Eval: manages all ADLs with the L non-dominant hand. Goal status: INITIAL  6.  Pt will sign name for documents with R dominant hand, adapted pen as needed, with >50% legibility. Baseline: Eval: Pt holds standard pen and can make marks on paper but not yet formulate letters of his name. Goal status: INITIAL  ASSESSMENT: CLINICAL IMPRESSION: Pt reported that he had an MD appointment yesterday and MD is scheduling MRI for chronic R shoulder pain sometime next week, date not yet confirmed.  Pt reported 3/10 pain in the R shoulder today and responded well to moist heat and passive stretching.  Pt is demonstrating increased tolerance for passive shoulder flexion with reduced pain (pt acknowledged no wincing).  R shoulder ER/IR remains quite stiff and limits ability to reach behind head and back during ADLs, but pt tolerated passive stretching well this date for all shoulder planes.  Pt reports he's been working on his finger extension consistently since last session and demonstrated great improvement with ability to release clothespins from hand today without OT assist. Pt continues to present with RUE weakness, still mild edema in the R hand, GMC/FMC deficits, decreased sensation to light touch, and mild hypertonicity throughout the RUE.  Pt will continue to benefit from skilled OT to address above noted deficits in order  to increase indep with ADLs and improve QOL.  PERFORMANCE DEFICITS: in functional skills including ADLs, IADLs, coordination, dexterity, sensation, edema, tone, ROM, strength, pain, flexibility, Fine motor control, Gross motor control, balance, body mechanics, decreased knowledge of use of DME, and UE functional use, and  psychosocial skills including coping strategies, habits, and routines and behaviors.   IMPAIRMENTS: are limiting patient from ADLs, IADLs, work, leisure, and social participation.   CO-MORBIDITIES: may have co-morbidities  that affects occupational performance. Patient will benefit from skilled OT to address above impairments and improve overall function.  MODIFICATION OR ASSISTANCE TO COMPLETE EVALUATION: No modification of tasks or assist necessary to complete an evaluation.  OT OCCUPATIONAL PROFILE AND HISTORY: Problem focused assessment: Including review of records relating to presenting problem.  CLINICAL DECISION MAKING: Moderate - several treatment options, min-mod task modification necessary  REHAB POTENTIAL: Good  EVALUATION COMPLEXITY: Moderate    PLAN:  OT FREQUENCY: 1-2x/week  OT DURATION: 12 weeks  PLANNED INTERVENTIONS: self care/ADL training, therapeutic exercise, therapeutic activity, neuromuscular re-education, manual therapy, passive range of motion, balance training, splinting, electrical stimulation, paraffin, moist heat, cryotherapy, contrast bath, patient/family education, coping strategies training, and DME and/or AE instructions  RECOMMENDED OTHER SERVICES: None at this time  CONSULTED AND AGREED WITH PLAN OF CARE: Patient  PLAN FOR NEXT SESSION: see above  Danelle Earthly, MS, OTR/L  [employer] Dials, OT 03/06/2023, 11:08 AM

## 2023-03-10 ENCOUNTER — Ambulatory Visit: Payer: Medicaid Other | Admitting: Physical Therapy

## 2023-03-10 ENCOUNTER — Ambulatory Visit: Payer: Medicaid Other

## 2023-03-10 NOTE — Assessment & Plan Note (Signed)
Referral placed to pulmonology / sleep medicine at last visit. Patient encouraged to follow-up.

## 2023-03-10 NOTE — Progress Notes (Signed)
     Primary Care / Sports Medicine Office Visit  Patient Information:  Patient ID: Ricardo Yang, male DOB: 1976-01-26 Age: 47 y.o. MRN: [id number]   Ricardo L Guise is a pleasant 47 y.o. male presenting with the following:  Chief Complaint  Patient presents with   Follow-up    Vitals:   03/03/23 1034  BP: 118/78  Pulse: 80  SpO2: 98%   Vitals:   03/03/23 1034  Weight: 270 lb (122.5 kg)  Height: 6' 3.75" (1.924 m)   Body mass index is 33.08 kg/m.     Independent interpretation of notes and tests performed by another provider:   None  Procedures performed:   None  Pertinent History, Exam, Impression, and Recommendations:   Ricardo was seen today for follow-up.  History of CVA with residual deficit -     DG Shoulder Right; Future  Dysfunction of right rotator cuff Assessment & Plan: Chronic, improved, did note benefit from prior cortisone injection which helped   Orders: -     DG Shoulder Right; Future  Chronic pain of right knee -     DG Knee Complete 4 Views Right; Future  Essential hypertension Assessment & Plan: Chronic, well-controlled on current regimen, follows with cardiology.   Sleep apnea, unspecified type Assessment & Plan: Referral placed to pulmonology / sleep medicine at last visit. Patient encouraged to follow-up.      Orders & Medications No orders of the defined types were placed in this encounter.  Orders Placed This Encounter  Procedures   DG Knee Complete 4 Views Right   DG Shoulder Right     No follow-ups on file.     Jerrol Banana, MD, [facility]   Primary Care Sports Medicine Primary Care and Sports Medicine at [facility]

## 2023-03-12 ENCOUNTER — Ambulatory Visit: Payer: Medicaid Other

## 2023-03-12 ENCOUNTER — Other Ambulatory Visit (HOSPITAL_COMMUNITY): Payer: Self-pay

## 2023-03-12 ENCOUNTER — Encounter: Payer: Self-pay | Admitting: Family

## 2023-03-12 ENCOUNTER — Other Ambulatory Visit: Payer: Self-pay

## 2023-03-12 ENCOUNTER — Ambulatory Visit: Payer: Medicaid Other | Admitting: Physical Therapy

## 2023-03-12 ENCOUNTER — Telehealth (HOSPITAL_COMMUNITY): Payer: Self-pay

## 2023-03-12 ENCOUNTER — Ambulatory Visit (HOSPITAL_BASED_OUTPATIENT_CLINIC_OR_DEPARTMENT_OTHER): Payer: Medicaid Other | Admitting: Family

## 2023-03-12 VITALS — BP 124/72 | HR 78 | Wt 271.4 lb

## 2023-03-12 DIAGNOSIS — F17210 Nicotine dependence, cigarettes, uncomplicated: Secondary | ICD-10-CM | POA: Insufficient documentation

## 2023-03-12 DIAGNOSIS — I5020 Unspecified systolic (congestive) heart failure: Secondary | ICD-10-CM | POA: Insufficient documentation

## 2023-03-12 DIAGNOSIS — R2689 Other abnormalities of gait and mobility: Secondary | ICD-10-CM

## 2023-03-12 DIAGNOSIS — Z79899 Other long term (current) drug therapy: Secondary | ICD-10-CM | POA: Insufficient documentation

## 2023-03-12 DIAGNOSIS — G4730 Sleep apnea, unspecified: Secondary | ICD-10-CM | POA: Insufficient documentation

## 2023-03-12 DIAGNOSIS — I255 Ischemic cardiomyopathy: Secondary | ICD-10-CM

## 2023-03-12 DIAGNOSIS — I63512 Cerebral infarction due to unspecified occlusion or stenosis of left middle cerebral artery: Secondary | ICD-10-CM

## 2023-03-12 DIAGNOSIS — Z7902 Long term (current) use of antithrombotics/antiplatelets: Secondary | ICD-10-CM | POA: Insufficient documentation

## 2023-03-12 DIAGNOSIS — Z955 Presence of coronary angioplasty implant and graft: Secondary | ICD-10-CM | POA: Insufficient documentation

## 2023-03-12 DIAGNOSIS — Z8673 Personal history of transient ischemic attack (TIA), and cerebral infarction without residual deficits: Secondary | ICD-10-CM | POA: Insufficient documentation

## 2023-03-12 DIAGNOSIS — Z7982 Long term (current) use of aspirin: Secondary | ICD-10-CM | POA: Insufficient documentation

## 2023-03-12 DIAGNOSIS — R2681 Unsteadiness on feet: Secondary | ICD-10-CM

## 2023-03-12 DIAGNOSIS — R278 Other lack of coordination: Secondary | ICD-10-CM

## 2023-03-12 DIAGNOSIS — I252 Old myocardial infarction: Secondary | ICD-10-CM | POA: Insufficient documentation

## 2023-03-12 DIAGNOSIS — M6281 Muscle weakness (generalized): Secondary | ICD-10-CM

## 2023-03-12 DIAGNOSIS — G35 Multiple sclerosis: Secondary | ICD-10-CM | POA: Insufficient documentation

## 2023-03-12 DIAGNOSIS — I6930 Unspecified sequelae of cerebral infarction: Secondary | ICD-10-CM | POA: Diagnosis not present

## 2023-03-12 DIAGNOSIS — I10 Essential (primary) hypertension: Secondary | ICD-10-CM | POA: Diagnosis not present

## 2023-03-12 DIAGNOSIS — I110 Hypertensive heart disease with heart failure: Secondary | ICD-10-CM | POA: Insufficient documentation

## 2023-03-12 DIAGNOSIS — I2510 Atherosclerotic heart disease of native coronary artery without angina pectoris: Secondary | ICD-10-CM | POA: Insufficient documentation

## 2023-03-12 DIAGNOSIS — F1290 Cannabis use, unspecified, uncomplicated: Secondary | ICD-10-CM | POA: Insufficient documentation

## 2023-03-12 DIAGNOSIS — Z9861 Coronary angioplasty status: Secondary | ICD-10-CM

## 2023-03-12 DIAGNOSIS — R279 Unspecified lack of coordination: Secondary | ICD-10-CM

## 2023-03-12 DIAGNOSIS — F1911 Other psychoactive substance abuse, in remission: Secondary | ICD-10-CM

## 2023-03-12 MED ORDER — METOPROLOL SUCCINATE ER 25 MG PO TB24
25.0000 mg | ORAL_TABLET | Freq: Every day | ORAL | 5 refills | Status: DC
Start: 2023-03-12 — End: 2024-03-07
  Filled 2023-03-12 – 2023-03-17 (×2): qty 30, 30d supply, fill #0
  Filled 2023-05-01: qty 30, 30d supply, fill #1
  Filled 2023-06-25 (×2): qty 30, 30d supply, fill #2
  Filled 2023-08-03: qty 30, 30d supply, fill #3
  Filled 2023-10-07: qty 30, 30d supply, fill #4
  Filled 2023-11-03: qty 30, 30d supply, fill #5

## 2023-03-12 NOTE — Patient Instructions (Signed)
If you need further instructions regarding dental clearance, please call Dr. Merita Norton office at [phone] to follow up regarding stopping your plavix and aspirin before dental visit. It looks like he's addressed that question the end of April

## 2023-03-12 NOTE — Therapy (Signed)
OUTPATIENT PHYSICAL THERAPY NEURO TREATMENT   Patient Name: Ricardo Yang MRN: [id number] DOB:1975/12/26, 47 y.o., male Today's Date: 03/12/2023   PCP: Jerrol Banana  REFERRING PROVIDER: Jerrol Banana   END OF SESSION:  PT End of Session - 03/12/23 1101     Visit Number 4    Number of Visits 24    Date for PT Re-Evaluation 04/07/23    Progress Note Due on Visit 10    PT Start Time 1101    PT Stop Time 1147    PT Time Calculation (min) 46 min    Equipment Utilized During Treatment Gait belt    Activity Tolerance Patient tolerated treatment well    Behavior During Therapy WFL for tasks assessed/performed              Past Medical History:  Diagnosis Date   Acute CVA (cerebrovascular accident) (HCC) 10/04/2022   Acute ST elevation myocardial infarction (STEMI) involving left anterior descending (LAD) coronary artery (HCC) 08/06/2019   Anxiety 09/2022   After stroke   CAD (coronary artery disease) 10/04/2022   Cerebrovascular accident (CVA) (HCC) 10/07/2022   Chest pain    CHF (congestive heart failure) (HCC)    Cocaine abuse (HCC)    Cocaine use 10/06/2022   Depression December 2023   When stroke happened   Elevated troponin    Encounter for imaging to screen for metal prior to MRI 10/07/2022   Heavy smoker    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 question possible MS   NSTEMI (non-ST elevated myocardial infarction) (HCC) 05/25/2015   Obesity    Polysubstance abuse (HCC)    a. tobacco, cocaine, crack, ecstasy, pills, "anything but injections"   Primary hypertension 10/05/2022   Sleep apnea    Always   STEMI (ST elevation myocardial infarction) (HCC) 08/06/2019   Past Surgical History:  Procedure Laterality Date   CARDIAC CATHETERIZATION N/A 05/28/2015   Procedure: Left Heart Cath and Coronary Angiography;  Surgeon: Antonieta Iba, MD;  Location: [facility];  Service: Cardiovascular;   Laterality: N/A;   CORONARY ANGIOGRAPHY N/A 04/22/2021   Procedure: CORONARY ANGIOGRAPHY;  Surgeon: Marcina Millard, MD;  Location: [facility];  Service: Cardiovascular;  Laterality: N/A;   CORONARY/GRAFT ACUTE MI REVASCULARIZATION N/A 08/06/2019   Procedure: Coronary/Graft Acute MI Revascularization;  Surgeon: Marcina Millard, MD;  Location: [facility];  Service: Cardiovascular;  Laterality: N/A;   LEFT HEART CATH N/A 04/22/2021   Procedure: Left Heart Cath;  Surgeon: Marcina Millard, MD;  Location: [facility];  Service: Cardiovascular;  Laterality: N/A;   LEFT HEART CATH AND CORONARY ANGIOGRAPHY N/A 08/06/2019   Procedure: LEFT HEART CATH AND CORONARY ANGIOGRAPHY;  Surgeon: Marcina Millard, MD;  Location: [facility];  Service: Cardiovascular;  Laterality: N/A;   TEE WITHOUT CARDIOVERSION N/A 10/08/2022   Procedure: TRANSESOPHAGEAL ECHOCARDIOGRAM (TEE);  Surgeon: Debbe Odea, MD;  Location: [facility];  Service: Cardiovascular;  Laterality: N/A;  11:30am   Patient Active Problem List   Diagnosis Date Noted   Sleep apnea 01/01/2023   Dysfunction of right rotator cuff 01/01/2023   History of CVA with residual deficit 12/04/2022   Mixed hyperlipidemia 12/04/2022   History of MI (myocardial infarction) 12/04/2022   Insomnia 10/17/2022   Slow transit constipation 10/17/2022   Chronic pain of both knees 10/17/2022   History of substance abuse (HCC) 10/15/2022   HFrEF (heart failure  with reduced ejection fraction) (HCC) 10/07/2022   Expressive aphasia 10/05/2022   Right hemiplegia (HCC) 10/05/2022   CAD S/P percutaneous coronary angioplasty 10/05/2022   Polysubstance abuse (HCC) 10/04/2022   Essential hypertension 04/22/2021   Ischemic chest pain [facility])     ONSET DATE: 09/2022  REFERRING DIAG: CVA of ACA, left MCA and likely of the right PCA territory with a large infarct involving the left postcentral gyrus.   THERAPY  DIAG:  Muscle weakness (generalized)  Other lack of coordination  Left middle cerebral artery stroke (HCC)  Unsteadiness on feet  Balance disorder  Unspecified lack of coordination  Rationale for Evaluation and Treatment: Rehabilitation  SUBJECTIVE:                                                                                                                                                                                             SUBJECTIVE STATEMENT: Pt reports doing well overall - no falls- but does endorse some posterior knee pain.   Pt accompanied by: self  PERTINENT HISTORY: CVA in 12/23. Multiple Cardiac caths over the last 5 years   PAIN: Right knee today Are you having pain? Yes: NPRS scale: 3/10 Pain location: R shoulder Pain description: pulling Aggravating factors: movement Relieving factors: heat and pain meds   PRECAUTIONS: Fall  WEIGHT BEARING RESTRICTIONS: No  FALLS: Has patient fallen in last 6 months? No  LIVING ENVIRONMENT: Lives with: lives alone and with room mate Lives in: House/apartment Stairs: Yes: External: 5 steps; on right going up and on left going up Has following equipment at home: None  PLOF: Independent and Independent with basic ADLs  PATIENT GOALS: put on pants with buckle. Improved balance and safety with gait   OBJECTIVE:   DIAGNOSTIC FINDINGS:  EXAM: Right UPPER EXTREMITY VENOUS DOPPLER ULTRASOUND IMPRESSION: No evidence of DVT within the right upper extremity.  EXAM: MRI HEAD WITHOUT CONTRAST IMPRESSION: 1. Acute infarcts in the left ACA, left MCA, and likely the right PCA territory, with a large infarct involving the left postcentral gyrus. Given distribution, these are favored to represent infarcts related to a central embolic etiology. There is a focal region in the right occipital lobe where there is likely a small amount of subarachnoid blood products, which could suggest an underlying component of  vasculitis. Recommend further evaluation with echocardiography. 2. Redemonstrated periventricular and subcortical T2/FLAIR hyperintense signal, as well as at the root entry zone of the right trigeminal nerve, which are suggestive of an underlying demyelinating disease.  COGNITION: Overall cognitive status: Within functional limits for tasks assessed   SENSATION: Light touch: Impaired  RUE  COORDINATION: Ankle to knee WFL BLE Finger to nose. Dysmetria and hemiplegia on the RUE     MUSCLE TONE: RLE: Within functional limits  MUSCLE LENGTH: Hamstrings: Right WFL ; Left WFL  Thomas test: Right WFL; Left WFL   DTRs:  Not tested  POSTURE: rounded shoulders, forward head, and anterior pelvic tilt  LOWER EXTREMITY ROM:     Active  Right Eval Left Eval  Hip flexion    Hip extension    Hip abduction    Hip adduction    Hip internal rotation    Hip external rotation    Knee flexion    Knee extension    Ankle dorsiflexion    Ankle plantarflexion    Ankle inversion    Ankle eversion     (Blank rows = not tested)  LOWER EXTREMITY MMT:    MMT Right Eval Left Eval  Hip flexion 4- 4-  Hip extension 4 4  Hip abduction 4+ 4+  Hip adduction 5 5  Hip internal rotation    Hip external rotation    Knee flexion 4+ 4+  Knee extension 5 5  Ankle dorsiflexion 4 4-  Ankle plantarflexion 4 4  Ankle inversion    Ankle eversion    (Blank rows = not tested)  BED MOBILITY:  Sit to supine Complete Independence Supine to sit Complete Independence  TRANSFERS: Assistive device utilized: None  Sit to stand: Complete Independence Stand to sit: Complete Independence Chair to chair: Complete Independence Floor: SBA  RAMP:  Level of Assistance: Complete Independence Assistive device utilized: None Ramp Comments: mild foot drop on the LLE   CURB:  Level of Assistance: Complete Independence Assistive device utilizeNoned:  Curb Comments: mild foot drop noted on the LLE    STAIRS: Level of Assistance: Modified independence Stair Negotiation Technique: Alternating Pattern  with No Rails Number of Stairs: 4  Height of Stairs: 6  Comments: foot drop noted on the LLE. But no LOB on this day  GAIT: Gait pattern: decreased arm swing- Right and decreased ankle dorsiflexion- Left Distance walked: 150 Assistive device utilized: None Level of assistance: Complete Independence Comments: foot drop on the LLE. Pt reports that this is a chronic issue  FUNCTIONAL TESTS:  5 times sit to stand: 19.23 with UE support, 19.82 with UE pushing from thighs  Timed up and go (TUG): 15.69 6 minute walk test: 1066 ft 10 meter walk test: 11.6 normal and 9.5 fast  Functional gait assessment: 24  PATIENT SURVEYS:  FOTO 68   TODAY'S TREATMENT:                                                                                                                              DATE: 03/12/2023   THEREX:  Nustep LE only level 1 x 2 min, level 3 x 2 min, level 1 x 1 min   Sit to stand without UE support x 10 reps Side stepping with GTB  around ankles- down and back x 3 Calf raise on 1/2 foam roll 2x15 Toe raise on 1/2 foam roll 2 x 15  NMR: in //bars  High knee march without UE support-down& back x 3  Butt kick (ham curls) without UE support - down & back x 3  Forward/retro step over 1/2 foam x 15 reps  Hurdle step over x 20 reps on each lower extremity with no upper extremity support   Tandem stand on 1/2 foam (curve side up) x 30 sec x 2 trials each LE         PATIENT EDUCATION: Education details: POC. Rehab potential. Goals.   Person educated: Patient Education method: Explanation Education comprehension: verbalized understanding  HOME EXERCISE PROGRAM: To be given on next appointment.   GOALS: Goals reviewed with patient? Yes  SHORT TERM GOALS: Target date: 02/24/2023   Pt will be independent with HEP  Baseline: to be given next visit Goal status:  INITIAL   LONG TERM GOALS: Target date: 04/07/2023   Pt will improved FGA >6 point to indicate improved function and reduced fall risk.  Baseline: 24 Goal status: INITIAL  2.  Pt will improve FOTO score to 71 to indicate improved balance, improved function with ADLs and reduced fall risk  Baseline: 68 Goal status: INITIAL  3.  Pt will improve 5xSTS <15 sec without UE SUpport to indicate reduced fall risk and  Baseline: 19.2 Goal status: INITIAL  4.  Pt will improve TUG <13 sec to indicate reduced fall risk and improved community mobility.  Baseline: 15.69 Goal status: INITIAL  5.  Pt will increase walk test >1247ft to indicate improved mobility in community setting, improved aerobic capacity and reduced fall risk.  Baseline: to be completed  Goal status: INITIAL  ASSESSMENT:  CLINICAL IMPRESSION:  Treatment continued per plan of care focusing on LE strength and balance training. He participated well- motivated to improve- Responsive to all verbal cues with some difficulty with tandem standing and retro steps but did improve with practice.   The pt will benefit from further skilled PT to address these deficits in order to improve functional mobility, QOL and to reduce fall risk.    OBJECTIVE IMPAIRMENTS: Abnormal gait, decreased activity tolerance, decreased balance, decreased coordination, decreased endurance, difficulty walking, decreased strength, and decreased safety awareness.   ACTIVITY LIMITATIONS: lifting, bending, squatting, transfers, and locomotion level  PARTICIPATION LIMITATIONS: cleaning, laundry, and driving  PERSONAL FACTORS: Age, Education, Past/current experiences, and Social background are also affecting patient's functional outcome.   REHAB POTENTIAL: Good  CLINICAL DECISION MAKING: Stable/uncomplicated  EVALUATION COMPLEXITY: Moderate  PLAN:  PT FREQUENCY: 1-2x/week  PT DURATION: 12 weeks  PLANNED INTERVENTIONS: Therapeutic exercises,  Therapeutic activity, Neuromuscular re-education, Balance training, Gait training, Patient/Family education, Self Care, Joint mobilization, Stair training, Orthotic/Fit training, and DME instructions  PLAN FOR NEXT SESSION: high level balance training. provide and update HEP. Incorporate use of RUE with functional movement.    Lenda Kelp, PT 03/12/2023, 11:48 AM

## 2023-03-12 NOTE — Telephone Encounter (Signed)
Advanced Heart Failure Patient Advocate Encounter  Prior authorization for Sherryll Burger has been submitted and approved. Test billing returns $4 for 90 day supply.  Key: [id number] Effective: 03/12/2023 to 03/11/2024  Burnell Blanks, CPhT Rx Patient Advocate Phone: [phone]

## 2023-03-12 NOTE — Progress Notes (Signed)
PCP: Joseph Berkshire, MD (last seen 05/24) Primary Cardiologist: Debbe Odea, MD (last seen 04/24)  HPI:  Mr Koepsell is a 47 y/o male with a history of CAD, HTN, stroke, anxiety, depression, multiple sclerosis, sleep apnea, tobacco use, polysubstance use and chronic heart failure.   TEE 10/08/22: EF 20-25%, no atrial thrombus, mild MR. Echo 10/05/22: EF <20%, mild LAE/RAE and mild MR  LHC 06/22: Mid RCA lesion is 50% stenosed. 2nd Mrg lesion is 100% stenosed. Ost LAD to Prox LAD lesion is 20% stenosed.  1.  Non-ST elevation myocardial infarction 2.  Patent stent proximal LAD 3.  Distal occlusion small to moderate caliber OM 2 4.  Mild to moderate reduced left ventricular function with apical dyskinesis  Was in the ED 11/27/22 due to right hand swelling. Ultrasound negative for DVT. Admitted 10/04/22 due to  acute onset of aphasia and right-sided weakness with leftward gaze. MRI showed acute infarct in the left ACA, left MCA and likely of the right PCA territory with a large infarct involving the left postcentral gyrus. Transferred to rehab.   He presents today for a HF f/u visit with a chief complaint of minimal SOB w/ moderate exertion. Chronic in nature. Has associated right arm swelling/ weakness (due to stroke) and gradual weight gain along with this. Has some broken teeth that he will need fixed and asks for clearance regarding holding his plavix/ ASA. Approval for holding these medications was already given by Dr. Azucena Cecil but patient says that he thinks he needs a form signed.   Smokes 1-2 cigarettes daily along with marijuana a couple of times/ week. Denies cocaine. Drinking 3 bottles of water, cup of coffee and 1-2 sodas daily. Has scales but is only weighing weekly because he gets discouraged with his weight gain due to inactivity.   ROS: All systems negative except as listed in HPI, PMH and Problem List.  SH:  Social History   Socioeconomic History   Marital status:  Single    Spouse name: Not on file   Number of children: Not on file   Years of education: Not on file   Highest education level: Not on file  Occupational History   Not on file  Tobacco Use   Smoking status: Every Day    Packs/day: 0.25    Years: 15.00    Additional pack years: 0.00    Total pack years: 3.75    Types: Cigarettes   Smokeless tobacco: Never   Tobacco comments:    Started as a teenager  Vaping Use   Vaping Use: Never used  Substance and Sexual Activity   Alcohol use: Not Currently    Alcohol/week: 1.0 standard drink of alcohol    Types: 1 Shots of liquor per week   Drug use: Yes    Frequency: 1.0 times per week    Types: Cocaine, Marijuana    Comment: 01/26/23 current marijuana no cocaine   Sexual activity: Not Currently    Partners: Female    Birth control/protection: Abstinence, Condom  Other Topics Concern   Not on file  Social History Narrative   Not on file   Social Determinants of Health   Financial Resource Strain: Not on file  Food Insecurity: No Food Insecurity (01/01/2023)   Hunger Vital Sign    Worried About Running Out of Food in the Last Year: Never true    Ran Out of Food in the Last Year: Never true  Transportation Needs: No Transportation Needs (01/01/2023)   PRAPARE -  Administrator, Civil Service (Medical): No    Lack of Transportation (Non-Medical): No  Physical Activity: Not on file  Stress: Not on file  Social Connections: Not on file  Intimate Partner Violence: Not At Risk (01/01/2023)   Humiliation, Afraid, Rape, and Kick questionnaire    Fear of Current or Ex-Partner: No    Emotionally Abused: No    Physically Abused: No    Sexually Abused: No    FH:  Family History  Problem Relation Age of Onset   Miscarriages / Stillbirths Mother    Heart disease Father    Hypertension Father    Depression Daughter    Alcohol abuse Maternal Grandfather    Cancer Maternal Grandfather    Depression Maternal Grandfather     Hypertension Maternal Grandfather    Stroke Maternal Grandfather    Cancer Maternal Grandmother    Hypertension Maternal Grandmother     Past Medical History:  Diagnosis Date   Acute CVA (cerebrovascular accident) (HCC) 10/04/2022   Acute ST elevation myocardial infarction (STEMI) involving left anterior descending (LAD) coronary artery (HCC) 08/06/2019   Anxiety 09/2022   After stroke   CAD (coronary artery disease) 10/04/2022   Cerebrovascular accident (CVA) (HCC) 10/07/2022   Chest pain    CHF (congestive heart failure) (HCC)    Cocaine abuse (HCC)    Cocaine use 10/06/2022   Depression December 2023   When stroke happened   Elevated troponin    Encounter for imaging to screen for metal prior to MRI 10/07/2022   Heavy smoker    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 question possible MS   NSTEMI (non-ST elevated myocardial infarction) (HCC) 05/25/2015   Obesity    Polysubstance abuse (HCC)    a. tobacco, cocaine, crack, ecstasy, pills, "anything but injections"   Primary hypertension 10/05/2022   Sleep apnea    Always   STEMI (ST elevation myocardial infarction) (HCC) 08/06/2019    Current Outpatient Medications  Medication Sig Dispense Refill   aspirin 81 MG chewable tablet Chew 1 tablet (81 mg total) by mouth daily. 30 tablet 5   atorvastatin (LIPITOR) 80 MG tablet Take 1 tablet (80 mg total) by mouth daily at 6 PM. 30 tablet 5   clopidogrel (PLAVIX) 75 MG tablet Take 1 tablet (75 mg total) by mouth daily with breakfast. 30 tablet 5   dapagliflozin propanediol (FARXIGA) 10 MG TABS tablet Take 1 tablet (10 mg total) by mouth daily. (Patient not taking: Reported on 03/03/2023) 30 tablet 5   lisinopril (ZESTRIL) 2.5 MG tablet Take 1 tablet (2.5 mg total) by mouth daily. 30 tablet 5   metoprolol succinate (TOPROL-XL) 25 MG 24 hr tablet Take 0.5 tablets (12.5 mg total) by mouth daily. 15 tablet 5   No current  facility-administered medications for this visit.   Vitals:   03/12/23 1403  BP: 124/72  Pulse: 78  SpO2: 99%  Weight: 271 lb 6.4 oz (123.1 kg)   Wt Readings from Last 3 Encounters:  03/12/23 271 lb 6.4 oz (123.1 kg)  03/03/23 270 lb (122.5 kg)  02/04/23 262 lb 2 oz (118.9 kg)   Lab Results  Component Value Date   CREATININE 0.97 10/20/2022   CREATININE 0.91 10/13/2022   CREATININE 1.10 10/10/2022   PHYSICAL EXAM:  General:  Well appearing. No resp difficulty HEENT: normal Neck: supple. JVP flat. No lymphadenopathy or thryomegaly appreciated. Cor: PMI normal. Regular rate &  rhythm. No rubs, gallops or murmurs. Lungs: clear Abdomen: soft, nontender, nondistended. No hepatosplenomegaly. No bruits or masses.  Extremities: no cyanosis, clubbing, rash. Right arm has swelling noted Neuro: alert & oriented x3, cranial nerves grossly intact. Weakness noted in right arm. Affect pleasant.  ECG: not done  ASSESSMENT & PLAN:  1: Ischemic heart failure with reduced ejection fraction- - NYHA class II - euvolemic - not weighing daily but does have scales; instructed to weigh daily and call for an overnight weight gain of > 2 pounds or a weekly weight gain of > 5 pounds; rationale for this was explained - weight up 9 pounds from last visit here 1 month ago - TEE 10/08/22: EF 20-25%, no atrial thrombus, mild MR. Echo 10/05/22: EF <20%, mild LAE/RAE and mild MR - continue farxiga 10mg  daily - continue metoprolol succinate 25mg  daily - continue lisinopril 2.5mg  daily; discuss changing this to entresto after he gets his dental work done - BMP today - continue using Mrs Sharilyn Sites for seasoning - still probably drinking more than 64 ounces but has decreased this from his last visit - PharmD reconciled meds w/ patient  2: HTN- - BP 124/72 - saw PCP Ashley Royalty) 03/24 - BMP 10/20/22 showed sodium 137, potassium 4.3, creatinine 0.97 & GFR >60 - BMP today  3: CAD- - saw cardiology  (Agbor-Etang) 04/24 - continue atorvastatin 80mg  daily - continue clopidogrel 75mg  daily - continue ASA 81mg  daily LHC 06/22: Mid RCA lesion is 50% stenosed. 2nd Mrg lesion is 100% stenosed. Ost LAD to Prox LAD lesion is 20% stenosed.  Non-ST elevation myocardial infarction Patent stent proximal LAD  Distal occlusion small to moderate caliber OM 2  Mild to moderate reduced left ventricular function with apical dyskinesis  4: Stroke- - continues with OT/ PT couple of times/ week - unable to grasp things small with his right hand (dominant hand is his right)  5: Substance use- - uses marijuana frequently - smokes 1-2 cigarettes daily - denies any cocaine use since the week before his stroke 12/23   Advised patient to contact Dr. Merita Norton office if he needs a form filled out regarding his dental procedure. Return here in 1 month, sooner if needed.

## 2023-03-12 NOTE — Progress Notes (Signed)
[facility] HEART FAILURE CLINIC - Pharmacist Note  Ricardo Yang is a 47 y.o. male with HFrEF (EF <40%) presenting to the Heart Failure Clinic for follow up. He reports no adverse drug effects and no issues with medication access since switching to [facility]. He reports no signs or symptoms of volume overload. He drinks 3 bottles of water, one cup of coffee, and 1-2 cans of soda daily. He reports that he checks his weight weekly and that his weight is relatively stable.   Recent ED Visit (past 6 months):  Date: 11/27/2022, CC: arm pain  Date: 10/04/2022, CC: code stroke  Guideline-Directed Medical Therapy/Evidence Based Medicine ACE/ARB/ARNI: Lisinopril 2.5 mg daily Beta Blocker: Metoprolol succinate 25 mg daily Aldosterone Antagonist:  none Diuretic:  none SGLT2i: Dapagliflozin 10 mg daily  Adherence Assessment Do you ever forget to take your medication? [] Yes [] No  Do you ever skip doses due to side effects? [] Yes [] No  Do you have trouble affording your medicines? [] Yes [] No  Are you ever unable to pick up your medication due to transportation difficulties? [] Yes [] No  Do you ever stop taking your medications because you don't believe they are helping? [] Yes [] No  Do you check your weight daily? [] Yes [x] No (weekly)  Adherence strategy: none reported Barriers to obtaining medications: none reported  Diagnostics ECHO: Date 10/08/2022, EF 20-25%, GHK  Vitals    03/12/2023    2:03 PM 03/03/2023   10:34 AM 02/04/2023    2:01 PM  Vitals with BMI  Height  6' 3.75"   Weight 271 lbs 6 oz 270 lbs 262 lbs 2 oz  BMI 33.26 33.08 32.76  Systolic 124 118 409  Diastolic 72 78 98  Pulse 78 80 79     Recent Labs    Latest Ref Rng & Units 10/20/2022    7:07 AM 10/13/2022    7:09 AM 10/10/2022    5:26 AM  BMP  Glucose 70 - 99 mg/dL 811  89  92   BUN 6 - 20 mg/dL 15  15  15    Creatinine 0.61 - 1.24 mg/dL 9.14  7.82  9.56   Sodium 135 - 145 mmol/L 137  136  137   Potassium  3.5 - 5.1 mmol/L 4.3  3.8  3.7   Chloride 98 - 111 mmol/L 106  103  100   CO2 22 - 32 mmol/L 22  25  28    Calcium 8.9 - 10.3 mg/dL 9.3  9.2  9.3     Past Medical History Past Medical History:  Diagnosis Date   Acute CVA (cerebrovascular accident) (HCC) 10/04/2022   Acute ST elevation myocardial infarction (STEMI) involving left anterior descending (LAD) coronary artery (HCC) 08/06/2019   Anxiety 09/2022   After stroke   CAD (coronary artery disease) 10/04/2022   Cerebrovascular accident (CVA) (HCC) 10/07/2022   Chest pain    CHF (congestive heart failure) (HCC)    Cocaine abuse (HCC)    Cocaine use 10/06/2022   Depression December 2023   When stroke happened   Elevated troponin    Encounter for imaging to screen for metal prior to MRI 10/07/2022   Heavy smoker    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 question possible MS   NSTEMI (non-ST elevated myocardial infarction) (HCC) 05/25/2015   Obesity    Polysubstance abuse (HCC)    a. tobacco, cocaine, crack, ecstasy, pills, "anything but injections"   Primary hypertension  10/05/2022   Sleep apnea    Always   STEMI (ST elevation myocardial infarction) (HCC) 08/06/2019    Plan Continue regimen per NP Consider switching lisinopril to Entresto 24/26 mg twice daily ([place] approved, copay $4 for 90d) Follow up with Dr. Azucena Cecil about how long to hold aspirin and clopidogrel for upcoming dental procedure  Time spent: 10 minutes  Celene Squibb, PharmD PGY1 Pharmacy Resident 03/12/2023 2:30 PM

## 2023-03-15 NOTE — Therapy (Signed)
OUTPATIENT OCCUPATIONAL THERAPY NEURO TREATMENT NOTE  Patient Name: Ricardo Yang MRN: [id number] DOB:Apr 13, 1976, 47 y.o., male Today's Date: 03/15/2023  PCP: Dr. Joseph Berkshire REFERRING PROVIDER: Dr. Joseph Berkshire  END OF SESSION:  OT End of Session - 03/15/23 1429     Visit Number 11    Number of Visits 24    Date for OT Re-Evaluation 04/22/23    Authorization Time Period Reporting period beginning 03/06/23    Progress Note Due on Visit 10    OT Start Time 1015    OT Stop Time 1100    OT Time Calculation (min) 45 min    Equipment Utilized During Treatment none    Activity Tolerance Patient tolerated treatment well    Behavior During Therapy [facility] for tasks assessed/performed            Past Medical History:  Diagnosis Date   Acute CVA (cerebrovascular accident) (HCC) 10/04/2022   Acute ST elevation myocardial infarction (STEMI) involving left anterior descending (LAD) coronary artery (HCC) 08/06/2019   Anxiety 09/2022   After stroke   CAD (coronary artery disease) 10/04/2022   Cerebrovascular accident (CVA) (HCC) 10/07/2022   Chest pain    CHF (congestive heart failure) (HCC)    Cocaine abuse (HCC)    Cocaine use 10/06/2022   Depression December 2023   When stroke happened   Elevated troponin    Encounter for imaging to screen for metal prior to MRI 10/07/2022   Heavy smoker    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 question possible MS   NSTEMI (non-ST elevated myocardial infarction) (HCC) 05/25/2015   Obesity    Polysubstance abuse (HCC)    a. tobacco, cocaine, crack, ecstasy, pills, "anything but injections"   Primary hypertension 10/05/2022   Sleep apnea    Always   STEMI (ST elevation myocardial infarction) (HCC) 08/06/2019   Past Surgical History:  Procedure Laterality Date   CARDIAC CATHETERIZATION N/A 05/28/2015   Procedure: Left Heart Cath and Coronary Angiography;  Surgeon: Antonieta Iba, MD;   Location: [facility] INVASIVE CV LAB;  Service: Cardiovascular;  Laterality: N/A;   CORONARY ANGIOGRAPHY N/A 04/22/2021   Procedure: CORONARY ANGIOGRAPHY;  Surgeon: Marcina Millard, MD;  Location: [facility] INVASIVE CV LAB;  Service: Cardiovascular;  Laterality: N/A;   CORONARY/GRAFT ACUTE MI REVASCULARIZATION N/A 08/06/2019   Procedure: Coronary/Graft Acute MI Revascularization;  Surgeon: Marcina Millard, MD;  Location: [facility] INVASIVE CV LAB;  Service: Cardiovascular;  Laterality: N/A;   LEFT HEART CATH N/A 04/22/2021   Procedure: Left Heart Cath;  Surgeon: Marcina Millard, MD;  Location: [facility] INVASIVE CV LAB;  Service: Cardiovascular;  Laterality: N/A;   LEFT HEART CATH AND CORONARY ANGIOGRAPHY N/A 08/06/2019   Procedure: LEFT HEART CATH AND CORONARY ANGIOGRAPHY;  Surgeon: Marcina Millard, MD;  Location: [facility] INVASIVE CV LAB;  Service: Cardiovascular;  Laterality: N/A;   TEE WITHOUT CARDIOVERSION N/A 10/08/2022   Procedure: TRANSESOPHAGEAL ECHOCARDIOGRAM (TEE);  Surgeon: Debbe Odea, MD;  Location: [facility] ORS;  Service: Cardiovascular;  Laterality: N/A;  11:30am   Patient Active Problem List   Diagnosis Date Noted   Sleep apnea 01/01/2023   Dysfunction of right rotator cuff 01/01/2023   History of CVA with residual deficit 12/04/2022   Mixed hyperlipidemia 12/04/2022   History of MI (myocardial infarction) 12/04/2022   Insomnia 10/17/2022   Slow transit constipation 10/17/2022   Chronic pain of both knees 10/17/2022   History of substance abuse (HCC) 10/15/2022  HFrEF (heart failure with reduced ejection fraction) (HCC) 10/07/2022   Expressive aphasia 10/05/2022   Right hemiplegia (HCC) 10/05/2022   CAD S/P percutaneous coronary angioplasty 10/05/2022   Polysubstance abuse (HCC) 10/04/2022   Essential hypertension 04/22/2021   Ischemic chest pain [facility])    ONSET DATE: 10/04/22  REFERRING DIAG: I63.9 (ICD-10-CM) - CVA (cerebral vascular accident)   THERAPY DIAG:   Muscle weakness (generalized)  Other lack of coordination  Left middle cerebral artery stroke (HCC)  Rationale for Evaluation and Treatment: Rehabilitation  SUBJECTIVE:  SUBJECTIVE STATEMENT: Pt verbalized having a tough week at home, reporting that he and his mother had a "falling out" and pt moved into the local shelter this week.   Pt accompanied by: self  PERTINENT HISTORY: CVA in 12/23. Multiple Cardiac caths over the last 5 years, hx of polysubstance abuse  PRECAUTIONS: None  WEIGHT BEARING RESTRICTIONS: No  PAIN:  Are you having pain? Yes: NPRS scale: 1/10 Pain location: R shoulder  Pain description: sharp and achy and burning Aggravating factors: lifting the arm Relieving factors: recent cortisone shot within 6 weeks  FALLS: Has patient fallen in last 6 months? 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    PATIENT GOALS: "If I could use my hand 75%.  And to use my hand to use the bathroom."  Pt enjoyed playing cards and shooting pool, playing video games prior to CVA.  OBJECTIVE:   HAND DOMINANCE: Right  ADLs: Overall ADLs: performing ADLs predominantly with the L non-dominant hand Transfers/ambulation related to ADLs: without AD Eating: 1 handed cutting, L hand only Grooming: electric toothbrush, L hand only UB Dressing: assist with buttons and belt  LB Dressing: assist to tie shoes; performs all L handed Toileting: using L hand for toilet hygiene and clothing management Bathing: L hand only Tub Shower transfers: indep with tub shower transfer Equipment: none  IADLs: Shopping: able to manage  light shopping trips, but typically goes with a family member for the driving assistance Light housekeeping: L hand only  Meal Prep: cold meal prep, some hot meal prep when able to use L hand only Community mobility: Pt reports he has not regularly driven since CVA, but has driven a couple of times to the store.  Ambulatory without AD. Medication management: easy open pill bottles and pt uses a pill organizer and sets up independently  Financial management: pt reports he has no current bills Handwriting:  Holds a pen loosely with R hand and can make a mark on paper but not able to write his initials   MOBILITY STATUS:  ambulatory without AD; see PT note for details.  POSTURE COMMENTS:  R sided hemiparesis Sitting balance: Moves/returns truncal midpoint >2 inches in all planes  ACTIVITY TOLERANCE: Activity tolerance: WFL for assessment, to be further assessed in future visits  FUNCTIONAL OUTCOME MEASURES: FOTO: 37 ; predicted 43 03/06/23: 44.8  UPPER EXTREMITY ROM:    Active ROM Right eval Left  Eval WNL Right 03/06/23  Shoulder flexion 95  125  Shoulder abduction     Shoulder adduction 68  118  Shoulder extension  Shoulder internal rotation Thumb to posterior hip  Thumb to mid lower back  Shoulder external rotation Thumb to R ear with slight chin tuck and head tilt  Thumb to posterior R ear without chin tuck or head tilt  Elbow flexion     Elbow extension     Wrist flexion     Wrist extension     Wrist ulnar deviation     Wrist radial deviation     Wrist pronation     Wrist supination     (Blank rows = not tested)  UPPER EXTREMITY MMT:     MMT Left eval Right eval Right 03/06/23  Shoulder flexion 5 3- 3+ (within available range)  Shoulder abduction 5 3- 3+ (within available range)  Shoulder adduction     Shoulder extension     Shoulder internal rotation 5 3- 4- (elbow at side)  Shoulder external rotation 5 3- 3+ (elbow at side)  Middle trapezius     Lower  trapezius     Elbow flexion 5 3+ 4  Elbow extension 5 3+ 4  Wrist flexion 5 4- 4  Wrist extension 5 4 4+  Wrist ulnar deviation     Wrist radial deviation     Wrist pronation 5 3+ 4  Wrist supination 5 3- 3+  (Blank rows = not tested)  HAND FUNCTION: Grip strength: Right: (3rd level on dynamometer) 4 lbs; Left: 108 lbs, Lateral pinch: Right: 2 lbs, Left: 22 lbs, and 3 point pinch: Right: 0 lbs, Left: 21 lbs (Saehan pinch gauge) 03/06/23: Grip strength: Right: (3rd level on dynamometer) 8 lbs; Left: 105 lbs, Lateral pinch: Right: 6 lbs, Left: 27 lbs, and 3 point pinch: Right: 4 lbs, Left: 17 lbs  COORDINATION: Finger Nose Finger test: RUE with difficulty 9 Hole Peg test: Right: unable sec; Left: 31.5 sec 03/06/23: R: unable; was able to remove a peg to place back into dish  SENSATION: Light touch: Impaired  pins and needles down the arm   EDEMA: R hand moderately edematous: 24 cm circumferentially at the MCPs of digits 2-5 (L 22.5 cm) 03/06/23: 23.3 cm  MUSCLE TONE: RUE: Mild and Hypertonic  COGNITION: Overall cognitive status: Within functional limits for tasks assessed  VISION: Baseline vision: No visual deficits  VISION ASSESSMENT: WFL  PERCEPTION: WFL  PRAXIS: Impaired: Motor planning  TODAY'S TREATMENT:                                                                                                                              DATE: 03/16/23 Moist heat applied to R shoulder for pain reduction/muscle relaxation during completion of therapeutic exercises noted below.    Neuro re-ed: Pt participated in set up of jumbo pegs into pegboard using R hand.  OT held each peg vertically for pt to grasp from OT' s hand, while pt used a palmar grasp to take peg from OT and place into pegboard.  Pt was given initial  cues to maintain back against back rest of chair and focus on elbow extension and forward flexion to reach for pegs and avoid a forward lean; pt able to demo and maintain  body mechanics with intermittent min vc and tactile cues.    Therapeutic Exercise: In sitting performed reps of passive stretching for R wrist and digit extension, R forearm supination, R shoulder ER/IR, abd, flexion, and horiz abd/add to tolerance.  Facilitated hand strengthening with use of hand gripper set at 6.6# to remove jumbo pegs from pegboard x1 trial using R hand and min guard-min A to facilitate proper form and technique and timing with grasp/release of pegs.  Pt participated in sitting UBE x 3 min forward rotation x 3 min reverse rotation, and 2 min forward rotation, working to increase BUE strength and activity tolerance for self care.  Pt tolerated UBE at min resistance and did not require use of ACE wrap to secure R hand to pedal today, though R thumb was loosely gripping.  Pt challenged self with intermittent short periods of R arm only to pedal during forward rotations.  Reviewed importance of high reps of active digit extension and finger taps at home to improve grasp/release of items using R hand.  Good return demo.  PATIENT EDUCATION: Education details: active digit extension exercise, finger taps Person educated: Patient Education method: Explanation Education comprehension: verbalized understanding  HOME EXERCISE PROGRAM: Theraputty exercises, cane stretches, digit AROM/AAROM/PROM/place and hold exercises for digit extension  GOALS: Goals reviewed with patient? Yes  SHORT TERM GOALS: Target date: 03/11/23 (6 weeks)  Pt will be indep to perform HEP for improving RUE strength and coordination. Baseline: Eval: not yet initiated; 03/06/23: indep and consistent with current program; will continue to progress as necessary Goal status: achieved  LONG TERM GOALS: Target date: 04/22/23 (12 weeks)  Pt will increase FOTO score to 43 or better to indicate improvement in self perceived functional use of the R arm with daily tasks. Baseline: Eval: 37; 03/06/23: 44.8 Goal status:  achieved/ongoing  2.  Pt will increase R grip strength by 15 or more lbs in order to improve ability to hold and carry light ADL supplies in R dominant hand.    Baseline: Eval: R grip 4 lbs, L 108 lbs (3rd slot on dynamometer); 03/06/23: R grip 8 lbs Goal status: ongoing  3.  Pt will increase R lateral pinch strength to be able to manipulate playing cards with minimal dropping. Baseline: Eval: R 2 lbs (L 22 lbs); pt has not attempted playing cards since CVA; 03/06/23: R lateral pinch 6 lbs Goal status: ongoing  4.  Pt will improve FMC/dexterity skills in R hand to be able to engage the R hand to assist the L with clothing fasteners. Baseline: Eval: R 9 hole (unable), L 31.5 sec; manages clothing fasteners with L hand only; 03/06/23: R 9 hole: unable; R hand can grossly grasp clothing to help with stabilizing while L hand manages fasteners. Goal status: ongoing  5.  Pt will increase RUE strength and coordination to be able to use the RUE as a good assist to the L during ADL/IADL tasks. Baseline: Eval: manages all ADLs with the L non-dominant hand; 03/06/23: Pt is using R hand as a stabilizer at least 75% of the time with ADLs; all strength measures improving throughout RUE (see chart above) and pt uses R hand as a poor-fair assist. Goal status: achieved/revised/ongoing  6.  Pt will sign name for documents with R dominant hand, adapted  pen as needed, with 100% legibility and demonstrate ability to isolate wrist and finger movements from forearm and upper arm while writing. Baseline: Eval: Pt holds standard pen and can make marks on paper but not yet formulate letters of his name; 03/06/23: pt can print name with 90% legibility using a built up foam grip on a standard pen (pt does yet not isolate finger and wrist movements from the forearm and upper arm when writing) Goal status: revised/ongoing  ASSESSMENT: CLINICAL IMPRESSION: Pt verbalized having a tough week at home, reporting that he and his  mother had a "falling out" and pt moved into the local shelter this week.  Pt acknowledged less time this week to work on his R hand exercises as he is adjusting to his new living situation, but pt remains very motivated with his therapy.  Pt tolerated UBE at min resistance and did not require use of ACE wrap to secure R hand to pedal today, though R thumb was loosely gripping.  Pt challenged self with intermittent short periods of R arm only to pedal during forward rotations.  Pt is using a palmar grasp with jumbo pegs and does require OT assist to hold peg vertically for pt to grab, as he does not yet have the dexterity to grasp a horizontal peg from container and move the peg to a vertical position without the assist of his L hand.  Pt continues to present with RUE weakness, still mild edema in the R hand, GMC/FMC deficits, decreased sensation to light touch, and mild hypertonicity throughout the RUE.  Pt will continue to benefit from skilled OT to address above noted deficits in order to increase indep with ADLs and improve QOL.  PERFORMANCE DEFICITS: in functional skills including ADLs, IADLs, coordination, dexterity, sensation, edema, tone, ROM, strength, pain, flexibility, Fine motor control, Gross motor control, balance, body mechanics, decreased knowledge of use of DME, and UE functional use, and psychosocial skills including coping strategies, habits, and routines and behaviors.   IMPAIRMENTS: are limiting patient from ADLs, IADLs, work, leisure, and social participation.   CO-MORBIDITIES: may have co-morbidities  that affects occupational performance. Patient will benefit from skilled OT to address above impairments and improve overall function.  MODIFICATION OR ASSISTANCE TO COMPLETE EVALUATION: No modification of tasks or assist necessary to complete an evaluation.  OT OCCUPATIONAL PROFILE AND HISTORY: Problem focused assessment: Including review of records relating to presenting  problem.  CLINICAL DECISION MAKING: Moderate - several treatment options, min-mod task modification necessary  REHAB POTENTIAL: Good  EVALUATION COMPLEXITY: Moderate    PLAN:  OT FREQUENCY: 1-2x/week  OT DURATION: 12 weeks  PLANNED INTERVENTIONS: self care/ADL training, therapeutic exercise, therapeutic activity, neuromuscular re-education, manual therapy, passive range of motion, balance training, splinting, electrical stimulation, paraffin, moist heat, cryotherapy, contrast bath, patient/family education, coping strategies training, and DME and/or AE instructions  RECOMMENDED OTHER SERVICES: None at this time  CONSULTED AND AGREED WITH PLAN OF CARE: Patient  PLAN FOR NEXT SESSION: see above  Danelle Earthly, MS, OTR/L  Otis Dials, OT 03/15/2023, 2:31 PM

## 2023-03-16 ENCOUNTER — Ambulatory Visit: Payer: Medicaid Other

## 2023-03-16 DIAGNOSIS — M6281 Muscle weakness (generalized): Secondary | ICD-10-CM | POA: Diagnosis not present

## 2023-03-16 DIAGNOSIS — I63512 Cerebral infarction due to unspecified occlusion or stenosis of left middle cerebral artery: Secondary | ICD-10-CM

## 2023-03-16 DIAGNOSIS — R278 Other lack of coordination: Secondary | ICD-10-CM

## 2023-03-17 ENCOUNTER — Ambulatory Visit: Payer: Medicaid Other | Admitting: Physical Therapy

## 2023-03-17 ENCOUNTER — Other Ambulatory Visit: Payer: Self-pay

## 2023-03-17 NOTE — Therapy (Signed)
OUTPATIENT OCCUPATIONAL THERAPY NEURO TREATMENT NOTE  Patient Name: Ricardo Yang MRN: [id number] DOB:1976-02-09, 47 y.o., male Today's Date: 03/17/2023  PCP: Dr. Joseph Berkshire REFERRING PROVIDER: Dr. Joseph Berkshire  END OF SESSION:  OT End of Session - 03/17/23 0907     Visit Number 12    Number of Visits 24    Date for OT Re-Evaluation 04/22/23    Authorization Time Period Reporting period beginning 03/06/23    Progress Note Due on Visit 10    OT Start Time 1515    OT Stop Time 1600    OT Time Calculation (min) 45 min    Equipment Utilized During Treatment none    Activity Tolerance Patient tolerated treatment well    Behavior During Therapy [facility] for tasks assessed/performed            Past Medical History:  Diagnosis Date   Acute CVA (cerebrovascular accident) (HCC) 10/04/2022   Acute ST elevation myocardial infarction (STEMI) involving left anterior descending (LAD) coronary artery (HCC) 08/06/2019   Anxiety 09/2022   After stroke   CAD (coronary artery disease) 10/04/2022   Cerebrovascular accident (CVA) (HCC) 10/07/2022   Chest pain    CHF (congestive heart failure) (HCC)    Cocaine abuse (HCC)    Cocaine use 10/06/2022   Depression December 2023   When stroke happened   Elevated troponin    Encounter for imaging to screen for metal prior to MRI 10/07/2022   Heavy smoker    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 question possible MS   NSTEMI (non-ST elevated myocardial infarction) (HCC) 05/25/2015   Obesity    Polysubstance abuse (HCC)    a. tobacco, cocaine, crack, ecstasy, pills, "anything but injections"   Primary hypertension 10/05/2022   Sleep apnea    Always   STEMI (ST elevation myocardial infarction) (HCC) 08/06/2019   Past Surgical History:  Procedure Laterality Date   CARDIAC CATHETERIZATION N/A 05/28/2015   Procedure: Left Heart Cath and Coronary Angiography;  Surgeon: Antonieta Iba, MD;   Location: [facility] INVASIVE CV LAB;  Service: Cardiovascular;  Laterality: N/A;   CORONARY ANGIOGRAPHY N/A 04/22/2021   Procedure: CORONARY ANGIOGRAPHY;  Surgeon: Marcina Millard, MD;  Location: [facility] INV[facility]IVE CV [facility];  Service: Cardiovascular;  Laterality: N/A;   CORONARY/GRAFT ACUTE MI REVASCULARIZATION N/A 08/06/2019   Procedure: Coronary/Graft Acute MI Revascularization;  Surgeon: Marcina Millard, MD;  Location: [facility] INV[facility]IVE CV [facility];  Service: Cardiovascular;  Laterality: N/A;   LEFT HEART CATH N/A 04/22/2021   Procedure: Left Heart Cath;  Surgeon: Marcina Millard, MD;  Location: [facility] INV[facility] CV [facility];  Service: Cardiovascular;  Laterality: N/A;   LEFT HEART CATH AND CORONARY ANGIOGRAPHY N/A 08/06/2019   Procedure: LEFT HEART CATH AND CORONARY ANGIOGRAPHY;  Surgeon: Marcina Millard, MD;  Location: [facility] INVASIVE CV [facility];  Service: Cardiovascular;  Laterality: N/A;   TEE WITHOUT CARDIOVERSION N/A 10/08/2022   Procedure: TRANSESOPHAGEAL ECHOCARDIOGRAM (TEE);  Surgeon: Debbe Odea, MD;  Location: [facility] ORS;  Service: Cardiovascular;  Laterality: N/A;  11:30am   Patient Active Problem List   Diagnosis Date Noted   Sleep apnea 01/01/2023   Dysfunction of right rotator cuff 01/01/2023   History of CVA with residual deficit 12/04/2022   Mixed hyperlipidemia 12/04/2022   History of MI (myocardial infarction) 12/04/2022   Insomnia 10/17/2022   Slow transit constipation 10/17/2022   Chronic pain of both knees 10/17/2022   History of substance abuse (HCC) 10/15/2022  HFrEF (heart failure with reduced ejection fraction) (HCC) 10/07/2022   Expressive aphasia 10/05/2022   Right hemiplegia (HCC) 10/05/2022   CAD S/P percutaneous coronary angioplasty 10/05/2022   Polysubstance abuse (HCC) 10/04/2022   Essential hypertension 04/22/2021   Ischemic chest pain (HCC)    ONSET DATE: 10/04/22  REFERRING DIAG: I63.9 (ICD-10-CM) - CVA (cerebral vascular accident)   THERAPY DIAG:   Muscle weakness (generalized)  Other lack of coordination  Left middle cerebral artery stroke (HCC)  Rationale for Evaluation and Treatment: Rehabilitation  SUBJECTIVE:  SUBJECTIVE STATEMENT: Pt reports moist heat to the shoulder has been really effective for pain management, and pt continues to report decreased pain as his shoulder mobility continues to improve. Pt accompanied by: self  PERTINENT HISTORY: CVA in 12/23. Multiple Cardiac caths over the last 5 years, hx of polysubstance abuse  PRECAUTIONS: None  WEIGHT BEARING RESTRICTIONS: No  PAIN:  Are you having pain? Yes: NPRS scale: 1/10 Pain location: R shoulder  Pain description: sharp and achy and burning Aggravating factors: lifting the arm Relieving factors: recent cortisone shot within 6 weeks  FALLS: Has patient fallen in last 6 months? 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    PATIENT GOALS: "If I could use my hand 75%.  And to use my hand to use the bathroom."  Pt enjoyed playing cards and shooting pool, playing video games prior to CVA.  OBJECTIVE:   HAND DOMINANCE: Right  ADLs: Overall ADLs: performing ADLs predominantly with the L non-dominant hand Transfers/ambulation related to ADLs: without AD Eating: 1 handed cutting, L hand only Grooming: electric toothbrush, L hand only UB Dressing: assist with buttons and belt  LB Dressing: assist to tie shoes; performs all L handed Toileting: using L hand for toilet hygiene and clothing management Bathing: L hand only Tub Shower transfers: indep with tub shower transfer Equipment:  none  IADLs: Shopping: able to manage light shopping trips, but typically goes with a family member for the driving assistance Light housekeeping: L hand only  Meal Prep: cold meal prep, some hot meal prep when able to use L hand only Community mobility: Pt reports he has not regularly driven since CVA, but has driven a couple of times to the store.  Ambulatory without AD. Medication management: easy open pill bottles and pt uses a pill organizer and sets up independently  Financial management: pt reports he has no current bills Handwriting:  Holds a pen loosely with R hand and can make a mark on paper but not able to write his initials   MOBILITY STATUS:  ambulatory without AD; see PT note for details.  POSTURE COMMENTS:  R sided hemiparesis Sitting balance: Moves/returns truncal midpoint >2 inches in all planes  ACTIVITY TOLERANCE: Activity tolerance: WFL for assessment, to be further assessed in future visits  FUNCTIONAL OUTCOME MEASURES: FOTO: 37 ; predicted 43 03/06/23: 44.8  UPPER EXTREMITY ROM:    Active ROM Right eval Left  Eval WNL Right 03/06/23  Shoulder flexion 95  125  Shoulder abduction     Shoulder adduction 68  118  Shoulder extension  Shoulder internal rotation Thumb to posterior hip  Thumb to mid lower back  Shoulder external rotation Thumb to R ear with slight chin tuck and head tilt  Thumb to posterior R ear without chin tuck or head tilt  Elbow flexion     Elbow extension     Wrist flexion     Wrist extension     Wrist ulnar deviation     Wrist radial deviation     Wrist pronation     Wrist supination     (Blank rows = not tested)  UPPER EXTREMITY MMT:     MMT Left eval Right eval Right 03/06/23  Shoulder flexion 5 3- 3+ (within available range)  Shoulder abduction 5 3- 3+ (within available range)  Shoulder adduction     Shoulder extension     Shoulder internal rotation 5 3- 4- (elbow at side)  Shoulder external rotation 5 3- 3+ (elbow at  side)  Middle trapezius     Lower trapezius     Elbow flexion 5 3+ 4  Elbow extension 5 3+ 4  Wrist flexion 5 4- 4  Wrist extension 5 4 4+  Wrist ulnar deviation     Wrist radial deviation     Wrist pronation 5 3+ 4  Wrist supination 5 3- 3+  (Blank rows = not tested)  HAND FUNCTION: Grip strength: Right: (3rd level on dynamometer) 4 lbs; Left: 108 lbs, Lateral pinch: Right: 2 lbs, Left: 22 lbs, and 3 point pinch: Right: 0 lbs, Left: 21 lbs (Saehan pinch gauge) 03/06/23: Grip strength: Right: (3rd level on dynamometer) 8 lbs; Left: 105 lbs, Lateral pinch: Right: 6 lbs, Left: 27 lbs, and 3 point pinch: Right: 4 lbs, Left: 17 lbs  COORDINATION: Finger Nose Finger test: RUE with difficulty 9 Hole Peg test: Right: unable sec; Left: 31.5 sec 03/06/23: R: unable; was able to remove a peg to place back into dish  SENSATION: Light touch: Impaired  pins and needles down the arm   EDEMA: R hand moderately edematous: 24 cm circumferentially at the MCPs of digits 2-5 (L 22.5 cm) 03/06/23: 23.3 cm  MUSCLE TONE: RUE: Mild and Hypertonic  COGNITION: Overall cognitive status: Within functional limits for tasks assessed  VISION: Baseline vision: No visual deficits  VISION ASSESSMENT: WFL  PERCEPTION: WFL  PRAXIS: Impaired: Motor planning  TODAY'S TREATMENT:                                                                                                                              DATE: 03/16/23 Moist heat applied to R shoulder for pain reduction/muscle relaxation during completion of therapeutic exercises noted below.    Therapeutic Exercise: In sitting performed reps of passive stretching for R wrist and digit extension, R forearm supination, R shoulder ER/IR, abd, flexion, and horiz abd/add to tolerance.  Facilitated hand strengthening with use of hand gripper set at min resistance with 1 red band.  Pt required set  up of gripper in hand, and cues for technique to maximize ROM with finger  flex/ext within each gripping rep.  Pt completed 3 sets 10 reps on the R hand.  Pt completed yellow theraband exercises for RUE strengthening for 2 sets of 10 reps for exercises noted below.  Constant min guard-min A provided at the elbow and wrist to maintain form and technique.  Pt completed R shoulder flexion and scaption patterns with yellow band, maintaining short arc to work only within a range where pt was able to maintain body mechanics without use of accessory muscle groups.  Also performed IR/ER with elbow at side, elbow flex, and elbow ext with yellow band.    Self Care: Participated in R handed and bilat self feeding strategies.  Pt practiced holding snack packages in the R hand while using L hand and teeth to open packages.  Pt able to hold a fruit chew in R hand and bring to mouth without L hand assist.  Pt made good attempts at picking up cashews with R hand, but struggled to pinch 1 or hold several in hand without dropping.  Pt used L hand to guard the R and to help bring R hand all the way to mouth.  Pt practiced sipping cup of water with L hand over R for cup to mouth sequence.  PATIENT EDUCATION: Education details: active digit extension exercise, finger taps Person educated: Patient Education method: Explanation Education comprehension: verbalized understanding  HOME EXERCISE PROGRAM: Theraputty exercises, cane stretches, digit AROM/AAROM/PROM/place and hold exercises for digit extension  GOALS: Goals reviewed with patient? Yes  SHORT TERM GOALS: Target date: 03/11/23 (6 weeks)  Pt will be indep to perform HEP for improving RUE strength and coordination. Baseline: Eval: not yet initiated; 03/06/23: indep and consistent with current program; will continue to progress as necessary Goal status: achieved  LONG TERM GOALS: Target date: 04/22/23 (12 weeks)  Pt will increase FOTO score to 43 or better to indicate improvement in self perceived functional use of the R arm with daily  tasks. Baseline: Eval: 37; 03/06/23: 44.8 Goal status: achieved/ongoing  2.  Pt will increase R grip strength by 15 or more lbs in order to improve ability to hold and carry light ADL supplies in R dominant hand.    Baseline: Eval: R grip 4 lbs, L 108 lbs (3rd slot on dynamometer); 03/06/23: R grip 8 lbs Goal status: ongoing  3.  Pt will increase R lateral pinch strength to be able to manipulate playing cards with minimal dropping. Baseline: Eval: R 2 lbs (L 22 lbs); pt has not attempted playing cards since CVA; 03/06/23: R lateral pinch 6 lbs Goal status: ongoing  4.  Pt will improve FMC/dexterity skills in R hand to be able to engage the R hand to assist the L with clothing fasteners. Baseline: Eval: R 9 hole (unable), L 31.5 sec; manages clothing fasteners with L hand only; 03/06/23: R 9 hole: unable; R hand can grossly grasp clothing to help with stabilizing while L hand manages fasteners. Goal status: ongoing  5.  Pt will increase RUE strength and coordination to be able to use the RUE as a good assist to the L during ADL/IADL tasks. Baseline: Eval: manages all ADLs with the L non-dominant hand; 03/06/23: Pt is using R hand as a stabilizer at least 75% of the time with ADLs; all strength measures improving throughout RUE (see chart above) and pt uses R hand as a poor-fair assist. Goal status: achieved/revised/ongoing  6.  Pt will sign name for documents with R dominant hand, adapted pen as needed, with 100% legibility and demonstrate ability to isolate wrist and finger movements from forearm and upper arm while writing. Baseline: Eval: Pt holds standard pen and can make marks on paper but not yet formulate letters of his name; 03/06/23: pt can print name with 90% legibility using a built up foam grip on a standard pen (pt does yet not isolate finger and wrist movements from the forearm and upper arm when writing) Goal status: revised/ongoing  ASSESSMENT: CLINICAL IMPRESSION: Pt steadily  increasing R shoulder mobility and reporting decreased pain in the shoulder joint.  Pt continues to respond well to moist heat and gentle stretch for managing R shoulder pain.  Initiated yellow theraband exercises for RUE strengthening this date.  Constant min guard-min A provided at the elbow and wrist to maintain form and technique while strengthening R shoulder and elbow planes.  Initiated R handed and bilat hand self feeding attempts.  Pt can hold light items in R hand that fit into palm, but not yet smaller pieces of food.  OT encouraged pt to use L hand over R hand for cup to mouth sequences and continue to make attempts at engaging the RUE as able into self feeding tasks.  Pt continues to present with RUE weakness, still mild edema in the R hand, GMC/FMC deficits, decreased sensation to light touch, and mild hypertonicity throughout the RUE.  Pt will continue to benefit from skilled OT to address above noted deficits in order to increase indep with ADLs and improve QOL.  PERFORMANCE DEFICITS: in functional skills including ADLs, IADLs, coordination, dexterity, sensation, edema, tone, ROM, strength, pain, flexibility, Fine motor control, Gross motor control, balance, body mechanics, decreased knowledge of use of DME, and UE functional use, and psychosocial skills including coping strategies, habits, and routines and behaviors.   IMPAIRMENTS: are limiting patient from ADLs, IADLs, work, leisure, and social participation.   CO-MORBIDITIES: may have co-morbidities  that affects occupational performance. Patient will benefit from skilled OT to address above impairments and improve overall function.  MODIFICATION OR ASSISTANCE TO COMPLETE EVALUATION: No modification of tasks or assist necessary to complete an evaluation.  OT OCCUPATIONAL PROFILE AND HISTORY: Problem focused assessment: Including review of records relating to presenting problem.  CLINICAL DECISION MAKING: Moderate - several treatment  options, min-mod task modification necessary  REHAB POTENTIAL: Good  EVALUATION COMPLEXITY: Moderate    PLAN:  OT FREQUENCY: 1-2x/week  OT DURATION: 12 weeks  PLANNED INTERVENTIONS: self care/ADL training, therapeutic exercise, therapeutic activity, neuromuscular re-education, manual therapy, passive range of motion, balance training, splinting, electrical stimulation, paraffin, moist heat, cryotherapy, contrast bath, patient/family education, coping strategies training, and DME and/or AE instructions  RECOMMENDED OTHER SERVICES: None at this time  CONSULTED AND AGREED WITH PLAN OF CARE: Patient  PLAN FOR NEXT SESSION: see above  Danelle Earthly, MS, OTR/L  Otis Dials, OT 03/17/2023, 9:08 AM

## 2023-03-18 NOTE — Therapy (Signed)
OUTPATIENT PHYSICAL THERAPY NEURO TREATMENT/ DISCHARGE THERAPY   Patient Name: Ricardo Yang MRN: [id number] DOB:09/20/1976, 47 y.o., male Today's Date: 03/19/2023   PCP: Jerrol Banana  REFERRING PROVIDER: Jerrol Banana   END OF SESSION:  PT End of Session - 03/19/23 1353     Visit Number 5    Number of Visits 24    Date for PT Re-Evaluation 04/07/23    Progress Note Due on Visit 10    PT Start Time 1431    PT Stop Time 1515    PT Time Calculation (min) 44 min    Equipment Utilized During Treatment Gait belt    Activity Tolerance Patient tolerated treatment well    Behavior During Therapy [facility] for tasks assessed/performed               Past Medical History:  Diagnosis Date   Acute CVA (cerebrovascular accident) (HCC) 10/04/2022   Acute ST elevation myocardial infarction (STEMI) involving left anterior descending (LAD) coronary artery (HCC) 08/06/2019   Anxiety 09/2022   After stroke   CAD (coronary artery disease) 10/04/2022   Cerebrovascular accident (CVA) (HCC) 10/07/2022   Chest pain    CHF (congestive heart failure) (HCC)    Cocaine abuse (HCC)    Cocaine use 10/06/2022   Depression December 2023   When stroke happened   Elevated troponin    Encounter for imaging to screen for metal prior to MRI 10/07/2022   Heavy smoker    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 question possible MS   NSTEMI (non-ST elevated myocardial infarction) (HCC) 05/25/2015   Obesity    Polysubstance abuse (HCC)    a. tobacco, cocaine, crack, ecstasy, pills, "anything but injections"   Primary hypertension 10/05/2022   Sleep apnea    Always   STEMI (ST elevation myocardial infarction) (HCC) 08/06/2019   Past Surgical History:  Procedure Laterality Date   CARDIAC CATHETERIZATION N/A 05/28/2015   Procedure: Left Heart Cath and Coronary Angiography;  Surgeon: Antonieta Iba, MD;  Location: [facility] INVASIVE CV [facility];  Service:  Cardiovascular;  Laterality: N/A;   CORONARY ANGIOGRAPHY N/A 04/22/2021   Procedure: CORONARY ANGIOGRAPHY;  Surgeon: Marcina Millard, MD;  Location: [facility] INV[facility] CV [facility];  Service: Cardiovascular;  Laterality: N/A;   CORONARY/GRAFT ACUTE MI REVASCULARIZATION N/A 08/06/2019   Procedure: Coronary/Graft Acute MI Revascularization;  Surgeon: Marcina Millard, MD;  Location: [facility] INV[facility] CV [facility];  Service: Cardiovascular;  Laterality: N/A;   LEFT HEART CATH N/A 04/22/2021   Procedure: Left Heart Cath;  Surgeon: Marcina Millard, MD;  Location: [facility] CV [facility];  Service: Cardiovascular;  Laterality: N/A;   LEFT HEART CATH AND CORONARY ANGIOGRAPHY N/A 08/06/2019   Procedure: LEFT HEART CATH AND CORONARY ANGIOGRAPHY;  Surgeon: Marcina Millard, MD;  Location: [facility] IN[facility] CV [facility];  Service: Cardiovascular;  Laterality: N/A;   TEE WITHOUT CARDIOVERSION N/A 10/08/2022   Procedure: TRANSESOPHAGEAL ECHOCARDIOGRAM (TEE);  Surgeon: Debbe Odea, MD;  Location: [facility] ORS;  Service: Cardiovascular;  Laterality: N/A;  11:30am   Patient Active Problem List   Diagnosis Date Noted   Sleep apnea 01/01/2023   Dysfunction of right rotator cuff 01/01/2023   History of CVA with residual deficit 12/04/2022   Mixed hyperlipidemia 12/04/2022   History of MI (myocardial infarction) 12/04/2022   Insomnia 10/17/2022   Slow transit constipation 10/17/2022   Chronic pain of both knees 10/17/2022   History of substance abuse (HCC) 10/15/2022  HFrEF (heart failure with reduced ejection fraction) (HCC) 10/07/2022   Expressive aphasia 10/05/2022   Right hemiplegia (HCC) 10/05/2022   CAD S/P percutaneous coronary angioplasty 10/05/2022   Polysubstance abuse (HCC) 10/04/2022   Essential hypertension 04/22/2021   Ischemic chest pain [facility])     ONSET DATE: 09/2022  REFERRING DIAG: CVA of ACA, left MCA and likely of the right PCA territory with a large infarct involving the left postcentral gyrus.    THERAPY DIAG:  Muscle weakness (generalized)  Other lack of coordination  Unsteadiness on feet  Rationale for Evaluation and Treatment: Rehabilitation  SUBJECTIVE:                                                                                                                                                                                             SUBJECTIVE STATEMENT:  Patient reports doing well.  Physical therapist asked patient how he is doing functionally and he reports he has been able to walk to the convenience store without significant difficulty and has had no loss of balance or any other similar issues.   Pt accompanied by: self  PERTINENT HISTORY: CVA in 12/23. Multiple Cardiac caths over the last 5 years   PAIN: Right knee today Are you having pain? Yes: NPRS scale: 3/10 Pain location: R shoulder Pain description: pulling Aggravating factors: movement Relieving factors: heat and pain meds   PRECAUTIONS: Fall  WEIGHT BEARING RESTRICTIONS: No  FALLS: Has patient fallen in last 6 months? No  LIVING ENVIRONMENT: Lives with: lives alone and with room mate Lives in: House/apartment Stairs: Yes: External: 5 steps; on right going up and on left going up Has following equipment at home: None  PLOF: Independent and Independent with basic ADLs  PATIENT GOALS: put on pants with buckle. Improved balance and safety with gait   OBJECTIVE:   DIAGNOSTIC FINDINGS:   EXAM: Right UPPER EXTREMITY VENOUS DOPPLER ULTRASOUND IMPRESSION: No evidence of DVT within the right upper extremity.  EXAM: MRI HEAD WITHOUT CONTRAST IMPRESSION: 1. Acute infarcts in the left ACA, left MCA, and likely the right PCA territory, with a large infarct involving the left postcentral gyrus. Given distribution, these are favored to represent infarcts related to a central embolic etiology. There is a focal region in the right occipital lobe where there is likely a small amount  of subarachnoid blood products, which could suggest an underlying component of vasculitis. Recommend further evaluation with echocardiography. 2. Redemonstrated periventricular and subcortical T2/FLAIR hyperintense signal, as well as at the root entry zone of the right trigeminal nerve, which are suggestive of an underlying demyelinating disease.  COGNITION: Overall cognitive  status: Within functional limits for tasks assessed   SENSATION: Light touch: Impaired  RUE   COORDINATION: Ankle to knee WFL BLE Finger to nose. Dysmetria and hemiplegia on the RUE     MUSCLE TONE: RLE: Within functional limits  MUSCLE LENGTH: Hamstrings: Right WFL ; Left WFL  Thomas test: Right WFL; Left WFL   DTRs:  Not tested  POSTURE: rounded shoulders, forward head, and anterior pelvic tilt  LOWER EXTREMITY ROM:     Active  Right Eval Left Eval  Hip flexion    Hip extension    Hip abduction    Hip adduction    Hip internal rotation    Hip external rotation    Knee flexion    Knee extension    Ankle dorsiflexion    Ankle plantarflexion    Ankle inversion    Ankle eversion     (Blank rows = not tested)  LOWER EXTREMITY MMT:    MMT Right Eval Left Eval  Hip flexion 4- 4-  Hip extension 4 4  Hip abduction 4+ 4+  Hip adduction 5 5  Hip internal rotation    Hip external rotation    Knee flexion 4+ 4+  Knee extension 5 5  Ankle dorsiflexion 4 4-  Ankle plantarflexion 4 4  Ankle inversion    Ankle eversion    (Blank rows = not tested)  BED MOBILITY:  Sit to supine Complete Independence Supine to sit Complete Independence  TRANSFERS: Assistive device utilized: None  Sit to stand: Complete Independence Stand to sit: Complete Independence Chair to chair: Complete Independence Floor: SBA  RAMP:  Level of Assistance: Complete Independence Assistive device utilized: None Ramp Comments: mild foot drop on the LLE   CURB:  Level of Assistance: Complete  Independence Assistive device utilizeNoned:  Curb Comments: mild foot drop noted on the LLE   STAIRS: Level of Assistance: Modified independence Stair Negotiation Technique: Alternating Pattern  with No Rails Number of Stairs: 4  Height of Stairs: 6  Comments: foot drop noted on the LLE. But no LOB on this day  GAIT: Gait pattern: decreased arm swing- Right and decreased ankle dorsiflexion- Left Distance walked: 150 Assistive device utilized: None Level of assistance: Complete Independence Comments: foot drop on the LLE. Pt reports that this is a chronic issue  FUNCTIONAL TESTS:  5 times sit to stand: 19.23 with UE support, 19.82 with UE pushing from thighs  Timed up and go (TUG): 15.69 6 minute walk test: 1066 ft 10 meter walk test: 11.6 normal and 9.5 fast  Functional gait assessment: 24  PATIENT SURVEYS:  FOTO 68   TODAY'S TREATMENT:                                                                                                                              DATE: 03/19/2023   Physical therapy treatment session today consisted of completing assessment of goals and administration of testing as demonstrated and  documented in flow sheet, treatment, and goals section of this note. Addition treatments may be found below.     Physical therapist provides patient with final home exercise program focusing on balance and lower extremity strength including sit to stands and tandem balance as he is continue to be areas of limitation with goal assessment.      PATIENT EDUCATION: Education details: POC. Rehab potential. Goals.   Person educated: Patient Education method: Explanation Education comprehension: verbalized understanding  HOME EXERCISE PROGRAM: 2 sets of 10 repetitions sit to stand and 2 sets of 30 seconds each lower extremity tandem balance near support surface but without upper extremity support  GOALS: Goals reviewed with patient? Yes  SHORT TERM GOALS: Target  date: 02/24/2023   Pt will be independent with HEP  Baseline: to be given next visit Goal status: INITIAL   LONG TERM GOALS: Target date: 04/07/2023   Pt will improved FGA >6 point to indicate improved function and reduced fall risk.  Baseline: 24 5/23:27 Goal status: PARTIALLY MET   2.  Pt will improve FOTO score to 71 to indicate improved balance, improved function with ADLs and reduced fall risk  Baseline: 68 5/23: 71% Goal status: MET  3.  Pt will improve 5xSTS <15 sec without UE Support to indicate reduced fall risk and  Baseline: 19.2 sec  5/23: 17.36 sec no UE, 14.96 UE Goal status: PARTIALLY MET   4.  Pt will improve TUG <13 sec to indicate reduced fall risk and improved community mobility.  Baseline: 15.69 5/23: 10.15  Goal status: MET  5.  Pt will increase walk test >1262ft to indicate improved mobility in community setting, improved aerobic capacity and reduced fall risk.  Baseline: 1150 ft Goal status: NOT MET  ASSESSMENT:  CLINICAL IMPRESSION:  Physical therapist assesses patient goals this date.  Patient has met or nearly met all of his physical therapy goals.  Due to patient's continued disability as a result of his right upper extremity as well as patient's limited therapy visit counts and patient's progress and safety with balance and lower extremity rated goals with plans to discharge from physical therapy this date in order to conserve more outpatient visits for occupational therapy to work on his right upper extremity functional limitations.  Patient is comfortable with this plan and is provided with a couple exercises to continue to progress his lower extremity strength as well as to continue to stimulate his balance responses.  Patient will be discharged from physical therapy at this time we will continue with occupational therapy per his occupational therapy plan of care and occupational therapist recommendations.  OBJECTIVE IMPAIRMENTS: Abnormal gait,  decreased activity tolerance, decreased balance, decreased coordination, decreased endurance, difficulty walking, decreased strength, and decreased safety awareness.   ACTIVITY LIMITATIONS: lifting, bending, squatting, transfers, and locomotion level  PARTICIPATION LIMITATIONS: cleaning, laundry, and driving  PERSONAL FACTORS: Age, Education, Past/current experiences, and Social background are also affecting patient's functional outcome.   REHAB POTENTIAL: Good  CLINICAL DECISION MAKING: Stable/uncomplicated  EVALUATION COMPLEXITY: Moderate  PLAN:  PT FREQUENCY: 1-2x/week  PT DURATION: 12 weeks  PLANNED INTERVENTIONS: Therapeutic exercises, Therapeutic activity, Neuromuscular re-education, Balance training, Gait training, Patient/Family education, Self Care, Joint mobilization, Stair training, Orthotic/Fit training, and DME instructions  PLAN FOR NEXT SESSION: D/C, continue with OT    Norman Herrlich, PT 03/19/2023, 3:25 PM

## 2023-03-19 ENCOUNTER — Ambulatory Visit: Payer: Medicaid Other | Admitting: Physical Therapy

## 2023-03-19 ENCOUNTER — Ambulatory Visit: Payer: Medicaid Other

## 2023-03-19 DIAGNOSIS — R278 Other lack of coordination: Secondary | ICD-10-CM

## 2023-03-19 DIAGNOSIS — M6281 Muscle weakness (generalized): Secondary | ICD-10-CM | POA: Diagnosis not present

## 2023-03-19 DIAGNOSIS — I63512 Cerebral infarction due to unspecified occlusion or stenosis of left middle cerebral artery: Secondary | ICD-10-CM

## 2023-03-19 DIAGNOSIS — R2681 Unsteadiness on feet: Secondary | ICD-10-CM

## 2023-03-19 NOTE — Therapy (Signed)
OUTPATIENT OCCUPATIONAL THERAPY NEURO TREATMENT NOTE  Patient Name: Ricardo Yang MRN: [id number] DOB:10/09/1976, 47 y.o., male Today's Date: 03/19/2023  PCP: Dr. Joseph Berkshire REFERRING PROVIDER: Dr. Joseph Berkshire  END OF SESSION:  OT End of Session - 03/19/23 1609     Visit Number 13    Number of Visits 24    Date for OT Re-Evaluation 04/22/23    Authorization Time Period Reporting period beginning 03/06/23    Progress Note Due on Visit 10    OT Start Time 1515    OT Stop Time 1600    OT Time Calculation (min) 45 min    Equipment Utilized During Treatment none    Activity Tolerance Patient tolerated treatment well    Behavior During Therapy [facility] for tasks assessed/performed            Past Medical History:  Diagnosis Date   Acute CVA (cerebrovascular accident) (HCC) 10/04/2022   Acute ST elevation myocardial infarction (STEMI) involving left anterior descending (LAD) coronary artery (HCC) 08/06/2019   Anxiety 09/2022   After stroke   CAD (coronary artery disease) 10/04/2022   Cerebrovascular accident (CVA) (HCC) 10/07/2022   Chest pain    CHF (congestive heart failure) (HCC)    Cocaine abuse (HCC)    Cocaine use 10/06/2022   Depression December 2023   When stroke happened   Elevated troponin    Encounter for imaging to screen for metal prior to MRI 10/07/2022   Heavy smoker    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 question possible MS   NSTEMI (non-ST elevated myocardial infarction) (HCC) 05/25/2015   Obesity    Polysubstance abuse (HCC)    a. tobacco, cocaine, crack, ecstasy, pills, "anything but injections"   Primary hypertension 10/05/2022   Sleep apnea    Always   STEMI (ST elevation myocardial infarction) (HCC) 08/06/2019   Past Surgical History:  Procedure Laterality Date   CARDIAC CATHETERIZATION N/A 05/28/2015   Procedure: Left Heart Cath and Coronary Angiography;  Surgeon: Antonieta Iba, MD;   Location: [facility] INVASIVE CV LAB;  Service: Cardiovascular;  Laterality: N/A;   CORONARY ANGIOGRAPHY N/A 04/22/2021   Procedure: CORONARY ANGIOGRAPHY;  Surgeon: Marcina Millard, MD;  Location: [facility] INVASIVE CV LAB;  Service: Cardiovascular;  Laterality: N/A;   CORONARY/GRAFT ACUTE MI REVASCULARIZATION N/A 08/06/2019   Procedure: Coronary/Graft Acute MI Revascularization;  Surgeon: Marcina Millard, MD;  Location: [facility] INVASIVE CV LAB;  Service: Cardiovascular;  Laterality: N/A;   LEFT HEART CATH N/A 04/22/2021   Procedure: Left Heart Cath;  Surgeon: Marcina Millard, MD;  Location: [facility] INVASIVE CV LAB;  Service: Cardiovascular;  Laterality: N/A;   LEFT HEART CATH AND CORONARY ANGIOGRAPHY N/A 08/06/2019   Procedure: LEFT HEART CATH AND CORONARY ANGIOGRAPHY;  Surgeon: Marcina Millard, MD;  Location: [facility] INVASIVE CV LAB;  Service: Cardiovascular;  Laterality: N/A;   TEE WITHOUT CARDIOVERSION N/A 10/08/2022   Procedure: TRANSESOPHAGEAL ECHOCARDIOGRAM (TEE);  Surgeon: Debbe Odea, MD;  Location: [facility] ORS;  Service: Cardiovascular;  Laterality: N/A;  11:30am   Patient Active Problem List   Diagnosis Date Noted   Sleep apnea 01/01/2023   Dysfunction of right rotator cuff 01/01/2023   History of CVA with residual deficit 12/04/2022   Mixed hyperlipidemia 12/04/2022   History of MI (myocardial infarction) 12/04/2022   Insomnia 10/17/2022   Slow transit constipation 10/17/2022   Chronic pain of both knees 10/17/2022   History of substance abuse (HCC) 10/15/2022  HFrEF (heart failure with reduced ejection fraction) (HCC) 10/07/2022   Expressive aphasia 10/05/2022   Right hemiplegia (HCC) 10/05/2022   CAD S/P percutaneous coronary angioplasty 10/05/2022   Polysubstance abuse (HCC) 10/04/2022   Essential hypertension 04/22/2021   Ischemic chest pain [facility])    ONSET DATE: 10/04/22  REFERRING DIAG: I63.9 (ICD-10-CM) - CVA (cerebral vascular accident)   THERAPY DIAG:   Muscle weakness (generalized)  Other lack of coordination  Left middle cerebral artery stroke (HCC)  Rationale for Evaluation and Treatment: Rehabilitation  SUBJECTIVE:  SUBJECTIVE STATEMENT: Pt reports he had a little soreness the next day after doing theraband exercises with OT last session, but no pain in the shoulder today.   Pt accompanied by: self  PERTINENT HISTORY: CVA in 12/23. Multiple Cardiac caths over the last 5 years, hx of polysubstance abuse  PRECAUTIONS: None  WEIGHT BEARING RESTRICTIONS: No  PAIN: mild muscle soreness during theraband exercises for RUE, but no pain at rest. Are you having pain? Yes: NPRS scale: 0/10 Pain location: R shoulder  Pain description: sharp and achy and burning Aggravating factors: lifting the arm Relieving factors: recent cortisone shot within 6 weeks  FALLS: Has patient fallen in last 6 months? 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    PATIENT GOALS: "If I could use my hand 75%.  And to use my hand to use the bathroom."  Pt enjoyed playing cards and shooting pool, playing video games prior to CVA.  OBJECTIVE:   HAND DOMINANCE: Right  ADLs: Overall ADLs: performing ADLs predominantly with the L non-dominant hand Transfers/ambulation related to ADLs: without AD Eating: 1 handed cutting, L hand only Grooming: electric toothbrush, L hand only UB Dressing: assist with buttons and belt  LB Dressing: assist to tie shoes; performs all L handed Toileting: using L hand for toilet hygiene and clothing management Bathing: L hand only Tub Shower transfers: indep with  tub shower transfer Equipment: none  IADLs: Shopping: able to manage light shopping trips, but typically goes with a family member for the driving assistance Light housekeeping: L hand only  Meal Prep: cold meal prep, some hot meal prep when able to use L hand only Community mobility: Pt reports he has not regularly driven since CVA, but has driven a couple of times to the store.  Ambulatory without AD. Medication management: easy open pill bottles and pt uses a pill organizer and sets up independently  Financial management: pt reports he has no current bills Handwriting:  Holds a pen loosely with R hand and can make a mark on paper but not able to write his initials   MOBILITY STATUS:  ambulatory without AD; see PT note for details.  POSTURE COMMENTS:  R sided hemiparesis Sitting balance: Moves/returns truncal midpoint >2 inches in all planes  ACTIVITY TOLERANCE: Activity tolerance: WFL for assessment, to be further assessed in future visits  FUNCTIONAL OUTCOME MEASURES: FOTO: 37 ; predicted 43 03/06/23: 44.8  UPPER EXTREMITY ROM:    Active ROM Right eval Left  Eval WNL Right 03/06/23  Shoulder flexion 95  125  Shoulder abduction  Shoulder adduction 68  118  Shoulder extension     Shoulder internal rotation Thumb to posterior hip  Thumb to mid lower back  Shoulder external rotation Thumb to R ear with slight chin tuck and head tilt  Thumb to posterior R ear without chin tuck or head tilt  Elbow flexion     Elbow extension     Wrist flexion     Wrist extension     Wrist ulnar deviation     Wrist radial deviation     Wrist pronation     Wrist supination     (Blank rows = not tested)  UPPER EXTREMITY MMT:     MMT Left eval Right eval Right 03/06/23  Shoulder flexion 5 3- 3+ (within available range)  Shoulder abduction 5 3- 3+ (within available range)  Shoulder adduction     Shoulder extension     Shoulder internal rotation 5 3- 4- (elbow at side)  Shoulder  external rotation 5 3- 3+ (elbow at side)  Middle trapezius     Lower trapezius     Elbow flexion 5 3+ 4  Elbow extension 5 3+ 4  Wrist flexion 5 4- 4  Wrist extension 5 4 4+  Wrist ulnar deviation     Wrist radial deviation     Wrist pronation 5 3+ 4  Wrist supination 5 3- 3+  (Blank rows = not tested)  HAND FUNCTION: Grip strength: Right: (3rd level on dynamometer) 4 lbs; Left: 108 lbs, Lateral pinch: Right: 2 lbs, Left: 22 lbs, and 3 point pinch: Right: 0 lbs, Left: 21 lbs (Saehan pinch gauge) 03/06/23: Grip strength: Right: (3rd level on dynamometer) 8 lbs; Left: 105 lbs, Lateral pinch: Right: 6 lbs, Left: 27 lbs, and 3 point pinch: Right: 4 lbs, Left: 17 lbs  COORDINATION: Finger Nose Finger test: RUE with difficulty 9 Hole Peg test: Right: unable sec; Left: 31.5 sec 03/06/23: R: unable; was able to remove a peg to place back into dish  SENSATION: Light touch: Impaired  pins and needles down the arm   EDEMA: R hand moderately edematous: 24 cm circumferentially at the MCPs of digits 2-5 (L 22.5 cm) 03/06/23: 23.3 cm  MUSCLE TONE: RUE: Mild and Hypertonic  COGNITION: Overall cognitive status: Within functional limits for tasks assessed  VISION: Baseline vision: No visual deficits  VISION ASSESSMENT: WFL  PERCEPTION: WFL  PRAXIS: Impaired: Motor planning  TODAY'S TREATMENT:                                                                                                                              DATE: 03/16/23 Moist heat applied to R shoulder for pain reduction/muscle relaxation during completion of therapeutic exercises noted below.    Therapeutic Exercise: In sitting performed reps of passive stretching for R shoulder ER/IR, abd, and flexion.  Instructed pt in self passive stretching exercises for R shoulder ER using doorway, and IR using towel behind back; good  return demo.  Pt completed yellow theraband exercises for RUE strengthening for 2 sets of 10 reps for  exercises noted below.  Instructed pt in strategies to anchor band to complete exercises independently at home.  Pt completed R shoulder flexion and abd maintaining short arc to work only within a range where pt was able to maintain body mechanics without use of accessory muscle groups.  Also performed bilat shoulder horiz abd, IR (band anchored to arm rest of chair)/ER with elbow at side, elbow flex, and elbow ext with yellow band.  Provided written handout for completion of above exercises, and mod vc for form/technique.  PATIENT EDUCATION: Education details: RUE theraband exercises Person educated: Patient Education method: Explanation Education comprehension: verbalized understanding  HOME EXERCISE PROGRAM: Theraputty exercises, cane stretches, digit AROM/AAROM/PROM/place and hold exercises for digit extension  GOALS: Goals reviewed with patient? Yes  SHORT TERM GOALS: Target date: 03/11/23 (6 weeks)  Pt will be indep to perform HEP for improving RUE strength and coordination. Baseline: Eval: not yet initiated; 03/06/23: indep and consistent with current program; will continue to progress as necessary Goal status: achieved  LONG TERM GOALS: Target date: 04/22/23 (12 weeks)  Pt will increase FOTO score to 43 or better to indicate improvement in self perceived functional use of the R arm with daily tasks. Baseline: Eval: 37; 03/06/23: 44.8 Goal status: achieved/ongoing  2.  Pt will increase R grip strength by 15 or more lbs in order to improve ability to hold and carry light ADL supplies in R dominant hand.    Baseline: Eval: R grip 4 lbs, L 108 lbs (3rd slot on dynamometer); 03/06/23: R grip 8 lbs Goal status: ongoing  3.  Pt will increase R lateral pinch strength to be able to manipulate playing cards with minimal dropping. Baseline: Eval: R 2 lbs (L 22 lbs); pt has not attempted playing cards since CVA; 03/06/23: R lateral pinch 6 lbs Goal status: ongoing  4.  Pt will improve  FMC/dexterity skills in R hand to be able to engage the R hand to assist the L with clothing fasteners. Baseline: Eval: R 9 hole (unable), L 31.5 sec; manages clothing fasteners with L hand only; 03/06/23: R 9 hole: unable; R hand can grossly grasp clothing to help with stabilizing while L hand manages fasteners. Goal status: ongoing  5.  Pt will increase RUE strength and coordination to be able to use the RUE as a good assist to the L during ADL/IADL tasks. Baseline: Eval: manages all ADLs with the L non-dominant hand; 03/06/23: Pt is using R hand as a stabilizer at least 75% of the time with ADLs; all strength measures improving throughout RUE (see chart above) and pt uses R hand as a poor-fair assist. Goal status: achieved/revised/ongoing  6.  Pt will sign name for documents with R dominant hand, adapted pen as needed, with 100% legibility and demonstrate ability to isolate wrist and finger movements from forearm and upper arm while writing. Baseline: Eval: Pt holds standard pen and can make marks on paper but not yet formulate letters of his name; 03/06/23: pt can print name with 90% legibility using a built up foam grip on a standard pen (pt does yet not isolate finger and wrist movements from the forearm and upper arm when writing) Goal status: revised/ongoing  ASSESSMENT: CLINICAL IMPRESSION: Pt steadily increasing R shoulder mobility and reporting decreased pain in the shoulder joint.  Progressed RUE yellow theraband this date with pt anchoring band to perform  R shoulder and elbow strengthening.  Pt was provided with mod vc for form and technique on first set, and min vc on second set with good demo of ability to follow written handout.  Pt instructed to add these to current HEP.  Pt continues to present with RUE weakness, still mild edema in the R hand, GMC/FMC deficits, decreased sensation to light touch, and mild hypertonicity throughout the RUE.  Pt will continue to benefit from skilled OT to  address above noted deficits in order to increase indep with ADLs and improve QOL.  PERFORMANCE DEFICITS: in functional skills including ADLs, IADLs, coordination, dexterity, sensation, edema, tone, ROM, strength, pain, flexibility, Fine motor control, Gross motor control, balance, body mechanics, decreased knowledge of use of DME, and UE functional use, and psychosocial skills including coping strategies, habits, and routines and behaviors.   IMPAIRMENTS: are limiting patient from ADLs, IADLs, work, leisure, and social participation.   CO-MORBIDITIES: may have co-morbidities  that affects occupational performance. Patient will benefit from skilled OT to address above impairments and improve overall function.  MODIFICATION OR ASSISTANCE TO COMPLETE EVALUATION: No modification of tasks or assist necessary to complete an evaluation.  OT OCCUPATIONAL PROFILE AND HISTORY: Problem focused assessment: Including review of records relating to presenting problem.  CLINICAL DECISION MAKING: Moderate - several treatment options, min-mod task modification necessary  REHAB POTENTIAL: Good  EVALUATION COMPLEXITY: Moderate    PLAN:  OT FREQUENCY: 1-2x/week  OT DURATION: 12 weeks  PLANNED INTERVENTIONS: self care/ADL training, therapeutic exercise, therapeutic activity, neuromuscular re-education, manual therapy, passive range of motion, balance training, splinting, electrical stimulation, paraffin, moist heat, cryotherapy, contrast bath, patient/family education, coping strategies training, and DME and/or AE instructions  RECOMMENDED OTHER SERVICES: None at this time  CONSULTED AND AGREED WITH PLAN OF CARE: Patient  PLAN FOR NEXT SESSION: see above  Danelle Earthly, MS, OTR/L  Otis Dials, OT 03/19/2023, 4:11 PM

## 2023-03-20 ENCOUNTER — Encounter: Payer: Self-pay | Admitting: Physical Therapy

## 2023-03-24 ENCOUNTER — Ambulatory Visit: Payer: Medicaid Other | Admitting: Physical Therapy

## 2023-03-24 ENCOUNTER — Ambulatory Visit: Payer: Medicaid Other

## 2023-03-24 DIAGNOSIS — M6281 Muscle weakness (generalized): Secondary | ICD-10-CM | POA: Diagnosis not present

## 2023-03-24 DIAGNOSIS — R278 Other lack of coordination: Secondary | ICD-10-CM

## 2023-03-24 DIAGNOSIS — I63512 Cerebral infarction due to unspecified occlusion or stenosis of left middle cerebral artery: Secondary | ICD-10-CM

## 2023-03-24 NOTE — Therapy (Signed)
OUTPATIENT OCCUPATIONAL THERAPY NEURO TREATMENT NOTE  Patient Name: Ricardo Yang MRN: [id number] DOB:10/06/1976, 47 y.o., male Today's Date: 03/24/2023  PCP: Dr. Joseph Berkshire REFERRING PROVIDER: Dr. Joseph Berkshire  END OF SESSION:  OT End of Session - 03/24/23 1527     Visit Number 14    Number of Visits 24    Date for OT Re-Evaluation 04/22/23    Authorization Time Period Reporting period beginning 03/06/23    Progress Note Due on Visit 10    OT Start Time 1345    OT Stop Time 1430    OT Time Calculation (min) 45 min    Equipment Utilized During Treatment none    Activity Tolerance Patient tolerated treatment well    Behavior During Therapy [facility] for tasks assessed/performed            Past Medical History:  Diagnosis Date   Acute CVA (cerebrovascular accident) (HCC) 10/04/2022   Acute ST elevation myocardial infarction (STEMI) involving left anterior descending (LAD) coronary artery (HCC) 08/06/2019   Anxiety 09/2022   After stroke   CAD (coronary artery disease) 10/04/2022   Cerebrovascular accident (CVA) (HCC) 10/07/2022   Chest pain    CHF (congestive heart failure) (HCC)    Cocaine abuse (HCC)    Cocaine use 10/06/2022   Depression December 2023   When stroke happened   Elevated troponin    Encounter for imaging to screen for metal prior to MRI 10/07/2022   Heavy smoker    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 question possible MS   NSTEMI (non-ST elevated myocardial infarction) (HCC) 05/25/2015   Obesity    Polysubstance abuse (HCC)    a. tobacco, cocaine, crack, ecstasy, pills, "anything but injections"   Primary hypertension 10/05/2022   Sleep apnea    Always   STEMI (ST elevation myocardial infarction) (HCC) 08/06/2019   Past Surgical History:  Procedure Laterality Date   CARDIAC CATHETERIZATION N/A 05/28/2015   Procedure: Left Heart Cath and Coronary Angiography;  Surgeon: Antonieta Iba, MD;   Location: [facility] IN[facility]IVE CV [facility];  Service: Cardiovascular;  Laterality: N/A;   CORONARY ANGIOGRAPHY N/A 04/22/2021   Procedure: CORONARY ANGIOGRAPHY;  Surgeon: Marcina Millard, MD;  Location: [facility] CV [facility];  Service: Cardiovascular;  Laterality: N/A;   CORONARY/GRAFT ACUTE MI REVASCULARIZATION N/A 08/06/2019   Procedure: Coronary/Graft Acute MI Revascularization;  Surgeon: Marcina Millard, MD;  Location: [facility];  Service: Cardiovascular;  Laterality: N/A;   LEFT HEART CATH N/A 04/22/2021   Procedure: Left Heart Cath;  Surgeon: Marcina Millard, MD;  Location: [facility];  Service: Cardiovascular;  Laterality: N/A;   LEFT HEART CATH AND CORONARY ANGIOGRAPHY N/A 08/06/2019   Procedure: LEFT HEART CATH AND CORONARY ANGIOGRAPHY;  Surgeon: Marcina Millard, MD;  Location: [facility];  Service: Cardiovascular;  Laterality: N/A;   TEE WITHOUT CARDIOVERSION N/A 10/08/2022   Procedure: TRANSESOPHAGEAL ECHOCARDIOGRAM (TEE);  Surgeon: Debbe Odea, MD;  Location: [facility] OR[facility];  Service: Cardiovascular;  Laterality: N/A;  11:30am   Patient Active Problem List   Diagnosis Date Noted   Sleep apnea 01/01/2023   Dysfunction of right rotator cuff 01/01/2023   History of CVA with residual deficit 12/04/2022   Mixed hyperlipidemia 12/04/2022   History of MI (myocardial infarction) 12/04/2022   Insomnia 10/17/2022   Slow transit constipation 10/17/2022   Chronic pain of both knees 10/17/2022   History of substance abuse (HCC) 10/15/2022  HFrEF (heart failure with reduced ejection fraction) (HCC) 10/07/2022   Expressive aphasia 10/05/2022   Right hemiplegia (HCC) 10/05/2022   CAD S/P percutaneous coronary angioplasty 10/05/2022   Polysubstance abuse (HCC) 10/04/2022   Essential hypertension 04/22/2021   Ischemic chest pain [facility])    ONSET DATE: 10/04/22  REFERRING DIAG: I63.9 (ICD-10-CM) - CVA (cerebral vascular accident)   THERAPY DIAG:   Muscle weakness (generalized)  Other lack of coordination  Left middle cerebral artery stroke (HCC)  Rationale for Evaluation and Treatment: Rehabilitation  SUBJECTIVE:  SUBJECTIVE STATEMENT: Pt reports his arm is feeling less tight and he's been working on swinging the arm more when he walks.  OT able to observe this today with noted improvement. Pt accompanied by: self  PERTINENT HISTORY: CVA in 12/23. Multiple Cardiac caths over the last 5 years, hx of polysubstance abuse  PRECAUTIONS: None  WEIGHT BEARING RESTRICTIONS: No  PAIN:  Are you having pain? Yes: NPRS scale: 0-2/10 Pain location: R shoulder  Pain description: sharp and achy and burning Aggravating factors: end range stretch Relieving factors: heat, stretch, using the arm  FALLS: Has patient fallen in last 6 months? 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    PATIENT GOALS: "If I could use my hand 75%.  And to use my hand to use the bathroom."  Pt enjoyed playing cards and shooting pool, playing video games prior to CVA.  OBJECTIVE:   HAND DOMINANCE: Right  ADLs: Overall ADLs: performing ADLs predominantly with the L non-dominant hand Transfers/ambulation related to ADLs: without AD Eating: 1 handed cutting, L hand only Grooming: electric toothbrush, L hand only UB Dressing: assist with buttons and belt  LB Dressing: assist to tie shoes; performs all L handed Toileting: using L hand for toilet hygiene and clothing management Bathing: L hand only Tub Shower transfers: indep with tub shower transfer Equipment: none  IADLs: Shopping: able  to manage light shopping trips, but typically goes with a family member for the driving assistance Light housekeeping: L hand only  Meal Prep: cold meal prep, some hot meal prep when able to use L hand only Community mobility: Pt reports he has not regularly driven since CVA, but has driven a couple of times to the store.  Ambulatory without AD. Medication management: easy open pill bottles and pt uses a pill organizer and sets up independently  Financial management: pt reports he has no current bills Handwriting:  Holds a pen loosely with R hand and can make a mark on paper but not able to write his initials   MOBILITY STATUS:  ambulatory without AD; see PT note for details.  POSTURE COMMENTS:  R sided hemiparesis Sitting balance: Moves/returns truncal midpoint >2 inches in all planes  ACTIVITY TOLERANCE: Activity tolerance: WFL for assessment, to be further assessed in future visits  FUNCTIONAL OUTCOME MEASURES: FOTO: 37 ; predicted 43 03/06/23: 44.8  UPPER EXTREMITY ROM:    Active ROM Right eval Left  Eval WNL Right 03/06/23  Shoulder flexion 95  125  Shoulder abduction     Shoulder adduction 68  118  Shoulder extension  Shoulder internal rotation Thumb to posterior hip  Thumb to mid lower back  Shoulder external rotation Thumb to R ear with slight chin tuck and head tilt  Thumb to posterior R ear without chin tuck or head tilt  Elbow flexion     Elbow extension     Wrist flexion     Wrist extension     Wrist ulnar deviation     Wrist radial deviation     Wrist pronation     Wrist supination     (Blank rows = not tested)  UPPER EXTREMITY MMT:     MMT Left eval Right eval Right 03/06/23  Shoulder flexion 5 3- 3+ (within available range)  Shoulder abduction 5 3- 3+ (within available range)  Shoulder adduction     Shoulder extension     Shoulder internal rotation 5 3- 4- (elbow at side)  Shoulder external rotation 5 3- 3+ (elbow at side)  Middle trapezius      Lower trapezius     Elbow flexion 5 3+ 4  Elbow extension 5 3+ 4  Wrist flexion 5 4- 4  Wrist extension 5 4 4+  Wrist ulnar deviation     Wrist radial deviation     Wrist pronation 5 3+ 4  Wrist supination 5 3- 3+  (Blank rows = not tested)  HAND FUNCTION: Grip strength: Right: (3rd level on dynamometer) 4 lbs; Left: 108 lbs, Lateral pinch: Right: 2 lbs, Left: 22 lbs, and 3 point pinch: Right: 0 lbs, Left: 21 lbs (Saehan pinch gauge) 03/06/23: Grip strength: Right: (3rd level on dynamometer) 8 lbs; Left: 105 lbs, Lateral pinch: Right: 6 lbs, Left: 27 lbs, and 3 point pinch: Right: 4 lbs, Left: 17 lbs  COORDINATION: Finger Nose Finger test: RUE with difficulty 9 Hole Peg test: Right: unable sec; Left: 31.5 sec 03/06/23: R: unable; was able to remove a peg to place back into dish  SENSATION: Light touch: Impaired  pins and needles down the arm   EDEMA: R hand moderately edematous: 24 cm circumferentially at the MCPs of digits 2-5 (L 22.5 cm) 03/06/23: 23.3 cm  MUSCLE TONE: RUE: Mild and Hypertonic  COGNITION: Overall cognitive status: Within functional limits for tasks assessed  VISION: Baseline vision: No visual deficits  VISION ASSESSMENT: WFL  PERCEPTION: WFL  PRAXIS: Impaired: Motor planning  TODAY'S TREATMENT:                                                                                                                              DATE: 03/16/23 Moist heat applied to R shoulder for pain reduction/muscle relaxation during completion of therapeutic exercises and neuro re-ed activities noted below.    Therapeutic Exercise: In sitting performed reps of passive stretching for R shoulder ER/IR, abd, and flexion.  Reviewed self passive stretching exercises for R shoulder ER using doorway with min tactile cues to keep R elbow tucked to side.  Reviewed self passive stretching for  maximizing R forearm sup; able to perform without initial demo from OT.    Neuro  re-ed: Facilitation of R grasp/release with a forward reach to pull square cubes off a resistive velcro block which was placed on an incline.  Pt given min vc to avoid a forward lean from the trunk.  Pt made initial attempts at securing fingertips around blocks but was unable to maintain a 2 point, 3 point, or lateral pinch around the blocks without hand over hand assist to keep R thumb IP from flexing, and IF and LF DIP joints from flexing.  Practiced high reps of hand over hand assist to grasp circular blocks using a 2 and 3 point pinch pattern to maintain prehension without IP and DIP flexion of digits.  Pt practiced picking up these blocks from a non-skid surface on table top and releasing into an upright cone.  Pt was cued to perform active and self passive digit extension stretch and WB through the hand on table top between reps to reduce flexor tone in hand.  PATIENT EDUCATION: Education details: RUE theraband exercises Person educated: Patient Education method: Explanation Education comprehension: verbalized understanding  HOME EXERCISE PROGRAM: Theraputty exercises, cane stretches, digit AROM/AAROM/PROM/place and hold exercises for digit extension; lateral and 3 point pinching to grasp small/light items from table top.  GOALS: Goals reviewed with patient? Yes  SHORT TERM GOALS: Target date: 03/11/23 (6 weeks)  Pt will be indep to perform HEP for improving RUE strength and coordination. Baseline: Eval: not yet initiated; 03/06/23: indep and consistent with current program; will continue to progress as necessary Goal status: achieved  LONG TERM GOALS: Target date: 04/22/23 (12 weeks)  Pt will increase FOTO score to 43 or better to indicate improvement in self perceived functional use of the R arm with daily tasks. Baseline: Eval: 37; 03/06/23: 44.8 Goal status: achieved/ongoing  2.  Pt will increase R grip strength by 15 or more lbs in order to improve ability to hold and carry light  ADL supplies in R dominant hand.    Baseline: Eval: R grip 4 lbs, L 108 lbs (3rd slot on dynamometer); 03/06/23: R grip 8 lbs Goal status: ongoing  3.  Pt will increase R lateral pinch strength to be able to manipulate playing cards with minimal dropping. Baseline: Eval: R 2 lbs (L 22 lbs); pt has not attempted playing cards since CVA; 03/06/23: R lateral pinch 6 lbs Goal status: ongoing  4.  Pt will improve FMC/dexterity skills in R hand to be able to engage the R hand to assist the L with clothing fasteners. Baseline: Eval: R 9 hole (unable), L 31.5 sec; manages clothing fasteners with L hand only; 03/06/23: R 9 hole: unable; R hand can grossly grasp clothing to help with stabilizing while L hand manages fasteners. Goal status: ongoing  5.  Pt will increase RUE strength and coordination to be able to use the RUE as a good assist to the L during ADL/IADL tasks. Baseline: Eval: manages all ADLs with the L non-dominant hand; 03/06/23: Pt is using R hand as a stabilizer at least 75% of the time with ADLs; all strength measures improving throughout RUE (see chart above) and pt uses R hand as a poor-fair assist. Goal status: achieved/revised/ongoing  6.  Pt will sign name for documents with R dominant hand, adapted pen as needed, with 100% legibility and demonstrate ability to isolate wrist and finger movements from forearm and upper arm while writing. Baseline: Eval: Pt holds standard  pen and can make marks on paper but not yet formulate letters of his name; 03/06/23: pt can print name with 90% legibility using a built up foam grip on a standard pen (pt does yet not isolate finger and wrist movements from the forearm and upper arm when writing) Goal status: revised/ongoing  ASSESSMENT: CLINICAL IMPRESSION: Pt reports consistency with his yellow theraband exercises which he learned last week.  Pt reported mild soreness the day after completing exercises, but not enough to keep him from performing his  exercises.  Pt continues to report less soreness in the shoulder and has been mindful of trying to swing the R arm some when ambulating, which OT observed today without prompting.  Focused this date on grasp/release and maintaining a 2 and 3 point pinch pattern to pick up light/small objects (small cubed and circular blocks from table top).  Pt struggled with maintaining R thumb IP ext and DIP ext of the 2nd and 3rd digits when attempting to use a 2 and 3 point pinch pattern.  Improved after high reps of hand over hand assist to maintain these pinch patterns, and was eventually able to manage a loose 2 point pinch to pick up a block, but not yet a 3 point pinch (assist required).  Pt was cued to perform active and self passive digit extension stretch and WB through the hand on table top between reps to reduce flexor tone in hand.  Pt continues to present with RUE weakness, still mild edema in the R hand, GMC/FMC deficits, decreased sensation to light touch, and mild hypertonicity throughout the RUE.  Pt will continue to benefit from skilled OT to address above noted deficits in order to increase indep with ADLs and improve QOL.  PERFORMANCE DEFICITS: in functional skills including ADLs, IADLs, coordination, dexterity, sensation, edema, tone, ROM, strength, pain, flexibility, Fine motor control, Gross motor control, balance, body mechanics, decreased knowledge of use of DME, and UE functional use, and psychosocial skills including coping strategies, habits, and routines and behaviors.   IMPAIRMENTS: are limiting patient from ADLs, IADLs, work, leisure, and social participation.   CO-MORBIDITIES: may have co-morbidities  that affects occupational performance. Patient will benefit from skilled OT to address above impairments and improve overall function.  MODIFICATION OR ASSISTANCE TO COMPLETE EVALUATION: No modification of tasks or assist necessary to complete an evaluation.  OT OCCUPATIONAL PROFILE AND  HISTORY: Problem focused assessment: Including review of records relating to presenting problem.  CLINICAL DECISION MAKING: Moderate - several treatment options, min-mod task modification necessary  REHAB POTENTIAL: Good  EVALUATION COMPLEXITY: Moderate    PLAN:  OT FREQUENCY: 1-2x/week  OT DURATION: 12 weeks  PLANNED INTERVENTIONS: self care/ADL training, therapeutic exercise, therapeutic activity, neuromuscular re-education, manual therapy, passive range of motion, balance training, splinting, electrical stimulation, paraffin, moist heat, cryotherapy, contrast bath, patient/family education, coping strategies training, and DME and/or AE instructions  RECOMMENDED OTHER SERVICES: None at this time  CONSULTED AND AGREED WITH PLAN OF CARE: Patient  PLAN FOR NEXT SESSION: see above  Danelle Earthly, MS, OTR/L  Ricardo Yang, OT 03/24/2023, 3:28 PM

## 2023-03-26 ENCOUNTER — Ambulatory Visit: Payer: Medicaid Other

## 2023-03-26 ENCOUNTER — Ambulatory Visit: Payer: Medicaid Other | Admitting: Physical Therapy

## 2023-03-26 DIAGNOSIS — I63512 Cerebral infarction due to unspecified occlusion or stenosis of left middle cerebral artery: Secondary | ICD-10-CM

## 2023-03-26 DIAGNOSIS — R278 Other lack of coordination: Secondary | ICD-10-CM

## 2023-03-26 DIAGNOSIS — M6281 Muscle weakness (generalized): Secondary | ICD-10-CM

## 2023-03-27 NOTE — Therapy (Signed)
OUTPATIENT OCCUPATIONAL THERAPY NEURO TREATMENT NOTE  Patient Name: Ricardo Yang MRN: [id number] DOB:1976-04-04, 47 y.o., male Today's Date: 03/27/2023  PCP: Dr. Joseph Berkshire REFERRING PROVIDER: Dr. Joseph Berkshire  END OF SESSION:  OT End of Session - 03/27/23 2122     Visit Number 15    Number of Visits 24    Date for OT Re-Evaluation 04/22/23    Authorization Time Period Reporting period beginning 03/06/23    Progress Note Due on Visit 10    OT Start Time 1345    OT Stop Time 1430    OT Time Calculation (min) 45 min    Equipment Utilized During Treatment none    Activity Tolerance Patient tolerated treatment well    Behavior During Therapy [facility] for tasks assessed/performed            Past Medical History:  Diagnosis Date   Acute CVA (cerebrovascular accident) (HCC) 10/04/2022   Acute ST elevation myocardial infarction (STEMI) involving left anterior descending (LAD) coronary artery (HCC) 08/06/2019   Anxiety 09/2022   After stroke   CAD (coronary artery disease) 10/04/2022   Cerebrovascular accident (CVA) (HCC) 10/07/2022   Chest pain    CHF (congestive heart failure) (HCC)    Cocaine abuse (HCC)    Cocaine use 10/06/2022   Depression December 2023   When stroke happened   Elevated troponin    Encounter for imaging to screen for metal prior to MRI 10/07/2022   Heavy smoker    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 question possible MS   NSTEMI (non-ST elevated myocardial infarction) (HCC) 05/25/2015   Obesity    Polysubstance abuse (HCC)    a. tobacco, cocaine, crack, ecstasy, pills, "anything but injections"   Primary hypertension 10/05/2022   Sleep apnea    Always   STEMI (ST elevation myocardial infarction) (HCC) 08/06/2019   Past Surgical History:  Procedure Laterality Date   CARDIAC CATHETERIZATION N/A 05/28/2015   Procedure: Left Heart Cath and Coronary Angiography;  Surgeon: Antonieta Iba, MD;   Location: [facility] INVASIVE CV LAB;  Service: Cardiovascular;  Laterality: N/A;   CORONARY ANGIOGRAPHY N/A 04/22/2021   Procedure: CORONARY ANGIOGRAPHY;  Surgeon: Marcina Millard, MD;  Location: [facility] INV[facility]IVE CV [facility];  Service: Cardiovascular;  Laterality: N/A;   CORONARY/GRAFT ACUTE MI REVASCULARIZATION N/A 08/06/2019   Procedure: Coronary/Graft Acute MI Revascularization;  Surgeon: Marcina Millard, MD;  Location: [facility] INVASIVE CV [facility];  Service: Cardiovascular;  Laterality: N/A;   LEFT HEART CATH N/A 04/22/2021   Procedure: Left Heart Cath;  Surgeon: Marcina Millard, MD;  Location: [facility] INV[facility] CV [facility];  Service: Cardiovascular;  Laterality: N/A;   LEFT HEART CATH AND CORONARY ANGIOGRAPHY N/A 08/06/2019   Procedure: LEFT HEART CATH AND CORONARY ANGIOGRAPHY;  Surgeon: Marcina Millard, MD;  Location: [facility] INV[facility]IVE CV [facility];  Service: Cardiovascular;  Laterality: N/A;   TEE WITHOUT CARDIOVERSION N/A 10/08/2022   Procedure: TRANSESOPHAGEAL ECHOCARDIOGRAM (TEE);  Surgeon: Debbe Odea, MD;  Location: [facility] ORS;  Service: Cardiovascular;  Laterality: N/A;  11:30am   Patient Active Problem List   Diagnosis Date Noted   Sleep apnea 01/01/2023   Dysfunction of right rotator cuff 01/01/2023   History of CVA with residual deficit 12/04/2022   Mixed hyperlipidemia 12/04/2022   History of MI (myocardial infarction) 12/04/2022   Insomnia 10/17/2022   Slow transit constipation 10/17/2022   Chronic pain of both knees 10/17/2022   History of substance abuse (HCC) 10/15/2022  HFrEF (heart failure with reduced ejection fraction) (HCC) 10/07/2022   Expressive aphasia 10/05/2022   Right hemiplegia (HCC) 10/05/2022   CAD S/P percutaneous coronary angioplasty 10/05/2022   Polysubstance abuse (HCC) 10/04/2022   Essential hypertension 04/22/2021   Ischemic chest pain [facility])    ONSET DATE: 10/04/22  REFERRING DIAG: I63.9 (ICD-10-CM) - CVA (cerebral vascular accident)   THERAPY DIAG:   Muscle weakness (generalized)  Other lack of coordination  Left middle cerebral artery stroke (HCC)  Rationale for Evaluation and Treatment: Rehabilitation  SUBJECTIVE:  SUBJECTIVE STATEMENT: Pt reports his arm is feeling less tight and he's been working on swinging the arm more when he walks.  OT able to observe this today with noted improvement. Pt accompanied by: self  PERTINENT HISTORY: CVA in 12/23. Multiple Cardiac caths over the last 5 years, hx of polysubstance abuse  PRECAUTIONS: None  WEIGHT BEARING RESTRICTIONS: No  PAIN:  Are you having pain? Yes: NPRS scale: 0-2/10 Pain location: R shoulder  Pain description: sharp and achy and burning Aggravating factors: end range stretch Relieving factors: heat, stretch, using the arm  FALLS: Has patient fallen in last 6 months? 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    PATIENT GOALS: "If I could use my hand 75%.  And to use my hand to use the bathroom."  Pt enjoyed playing cards and shooting pool, playing video games prior to CVA.  OBJECTIVE:   HAND DOMINANCE: Right  ADLs: Overall ADLs: performing ADLs predominantly with the L non-dominant hand Transfers/ambulation related to ADLs: without AD Eating: 1 handed cutting, L hand only Grooming: electric toothbrush, L hand only UB Dressing: assist with buttons and belt  LB Dressing: assist to tie shoes; performs all L handed Toileting: using L hand for toilet hygiene and clothing management Bathing: L hand only Tub Shower transfers: indep with tub shower transfer Equipment: none  IADLs: Shopping: able  to manage light shopping trips, but typically goes with a family member for the driving assistance Light housekeeping: L hand only  Meal Prep: cold meal prep, some hot meal prep when able to use L hand only Community mobility: Pt reports he has not regularly driven since CVA, but has driven a couple of times to the store.  Ambulatory without AD. Medication management: easy open pill bottles and pt uses a pill organizer and sets up independently  Financial management: pt reports he has no current bills Handwriting:  Holds a pen loosely with R hand and can make a mark on paper but not able to write his initials   MOBILITY STATUS:  ambulatory without AD; see PT note for details.  POSTURE COMMENTS:  R sided hemiparesis Sitting balance: Moves/returns truncal midpoint >2 inches in all planes  ACTIVITY TOLERANCE: Activity tolerance: WFL for assessment, to be further assessed in future visits  FUNCTIONAL OUTCOME MEASURES: FOTO: 37 ; predicted 43 03/06/23: 44.8  UPPER EXTREMITY ROM:    Active ROM Right eval Left  Eval WNL Right 03/06/23  Shoulder flexion 95  125  Shoulder abduction     Shoulder adduction 68  118  Shoulder extension  Shoulder internal rotation Thumb to posterior hip  Thumb to mid lower back  Shoulder external rotation Thumb to R ear with slight chin tuck and head tilt  Thumb to posterior R ear without chin tuck or head tilt  Elbow flexion     Elbow extension     Wrist flexion     Wrist extension     Wrist ulnar deviation     Wrist radial deviation     Wrist pronation     Wrist supination     (Blank rows = not tested)  UPPER EXTREMITY MMT:     MMT Left eval Right eval Right 03/06/23  Shoulder flexion 5 3- 3+ (within available range)  Shoulder abduction 5 3- 3+ (within available range)  Shoulder adduction     Shoulder extension     Shoulder internal rotation 5 3- 4- (elbow at side)  Shoulder external rotation 5 3- 3+ (elbow at side)  Middle trapezius      Lower trapezius     Elbow flexion 5 3+ 4  Elbow extension 5 3+ 4  Wrist flexion 5 4- 4  Wrist extension 5 4 4+  Wrist ulnar deviation     Wrist radial deviation     Wrist pronation 5 3+ 4  Wrist supination 5 3- 3+  (Blank rows = not tested)  HAND FUNCTION: Grip strength: Right: (3rd level on dynamometer) 4 lbs; Left: 108 lbs, Lateral pinch: Right: 2 lbs, Left: 22 lbs, and 3 point pinch: Right: 0 lbs, Left: 21 lbs (Saehan pinch gauge) 03/06/23: Grip strength: Right: (3rd level on dynamometer) 8 lbs; Left: 105 lbs, Lateral pinch: Right: 6 lbs, Left: 27 lbs, and 3 point pinch: Right: 4 lbs, Left: 17 lbs  COORDINATION: Finger Nose Finger test: RUE with difficulty 9 Hole Peg test: Right: unable sec; Left: 31.5 sec 03/06/23: R: unable; was able to remove a peg to place back into dish  SENSATION: Light touch: Impaired  pins and needles down the arm   EDEMA: R hand moderately edematous: 24 cm circumferentially at the MCPs of digits 2-5 (L 22.5 cm) 03/06/23: 23.3 cm  MUSCLE TONE: RUE: Mild and Hypertonic  COGNITION: Overall cognitive status: Within functional limits for tasks assessed  VISION: Baseline vision: No visual deficits  VISION ASSESSMENT: WFL  PERCEPTION: WFL  PRAXIS: Impaired: Motor planning  TODAY'S TREATMENT:                                                                                                                              DATE: 03/16/23 Moist heat applied to R shoulder for pain reduction/muscle relaxation during completion of therapeutic exercises and neuro re-ed activities noted below.    Therapeutic Exercise: In sitting performed reps of passive stretching for R shoulder ER/IR, abd, and flexion.  Reviewed self passive stretching exercises for R shoulder ER using doorway with min tactile cues to keep R elbow tucked to side.  Reviewed self passive stretching for  maximizing R forearm sup; able to perform without initial demo from OT.    Neuro re-ed: Jumbo  pegs  PATIENT EDUCATION: Education details: RUE theraband exercises Person educated: Patient Education method: Explanation Education comprehension: verbalized understanding  HOME EXERCISE PROGRAM: Theraputty exercises, cane stretches, digit AROM/AAROM/PROM/place and hold exercises for digit extension; lateral and 3 point pinching to grasp small/light items from table top.  GOALS: Goals reviewed with patient? Yes  SHORT TERM GOALS: Target date: 03/11/23 (6 weeks)  Pt will be indep to perform HEP for improving RUE strength and coordination. Baseline: Eval: not yet initiated; 03/06/23: indep and consistent with current program; will continue to progress as necessary Goal status: achieved  LONG TERM GOALS: Target date: 04/22/23 (12 weeks)  Pt will increase FOTO score to 43 or better to indicate improvement in self perceived functional use of the R arm with daily tasks. Baseline: Eval: 37; 03/06/23: 44.8 Goal status: achieved/ongoing  2.  Pt will increase R grip strength by 15 or more lbs in order to improve ability to hold and carry light ADL supplies in R dominant hand.    Baseline: Eval: R grip 4 lbs, L 108 lbs (3rd slot on dynamometer); 03/06/23: R grip 8 lbs Goal status: ongoing  3.  Pt will increase R lateral pinch strength to be able to manipulate playing cards with minimal dropping. Baseline: Eval: R 2 lbs (L 22 lbs); pt has not attempted playing cards since CVA; 03/06/23: R lateral pinch 6 lbs Goal status: ongoing  4.  Pt will improve FMC/dexterity skills in R hand to be able to engage the R hand to assist the L with clothing fasteners. Baseline: Eval: R 9 hole (unable), L 31.5 sec; manages clothing fasteners with L hand only; 03/06/23: R 9 hole: unable; R hand can grossly grasp clothing to help with stabilizing while L hand manages fasteners. Goal status: ongoing  5.  Pt will increase RUE strength and coordination to be able to use the RUE as a good assist to the L during ADL/IADL  tasks. Baseline: Eval: manages all ADLs with the L non-dominant hand; 03/06/23: Pt is using R hand as a stabilizer at least 75% of the time with ADLs; all strength measures improving throughout RUE (see chart above) and pt uses R hand as a poor-fair assist. Goal status: achieved/revised/ongoing  6.  Pt will sign name for documents with R dominant hand, adapted pen as needed, with 100% legibility and demonstrate ability to isolate wrist and finger movements from forearm and upper arm while writing. Baseline: Eval: Pt holds standard pen and can make marks on paper but not yet formulate letters of his name; 03/06/23: pt can print name with 90% legibility using a built up foam grip on a standard pen (pt does yet not isolate finger and wrist movements from the forearm and upper arm when writing) Goal status: revised/ongoing  ASSESSMENT: CLINICAL IMPRESSION: Pt reports consistency with his yellow theraband exercises which he learned last week.  Pt reported mild soreness the day after completing exercises, but not enough to keep him from performing his exercises.  Pt continues to report less soreness in the shoulder and has been mindful of trying to swing the R arm some when ambulating, which OT observed today without prompting.  Focused this date on grasp/release and maintaining a 2 and 3 point pinch pattern to pick up light/small objects (small cubed and circular blocks from table top).  Pt struggled with maintaining R thumb IP ext and DIP ext  of the 2nd and 3rd digits when attempting to use a 2 and 3 point pinch pattern.  Improved after high reps of hand over hand assist to maintain these pinch patterns, and was eventually able to manage a loose 2 point pinch to pick up a block, but not yet a 3 point pinch (assist required).  Pt was cued to perform active and self passive digit extension stretch and WB through the hand on table top between reps to reduce flexor tone in hand.  Pt continues to present with RUE  weakness, still mild edema in the R hand, GMC/FMC deficits, decreased sensation to light touch, and mild hypertonicity throughout the RUE.  Pt will continue to benefit from skilled OT to address above noted deficits in order to increase indep with ADLs and improve QOL.  PERFORMANCE DEFICITS: in functional skills including ADLs, IADLs, coordination, dexterity, sensation, edema, tone, ROM, strength, pain, flexibility, Fine motor control, Gross motor control, balance, body mechanics, decreased knowledge of use of DME, and UE functional use, and psychosocial skills including coping strategies, habits, and routines and behaviors.   IMPAIRMENTS: are limiting patient from ADLs, IADLs, work, leisure, and social participation.   CO-MORBIDITIES: may have co-morbidities  that affects occupational performance. Patient will benefit from skilled OT to address above impairments and improve overall function.  MODIFICATION OR ASSISTANCE TO COMPLETE EVALUATION: No modification of tasks or assist necessary to complete an evaluation.  OT OCCUPATIONAL PROFILE AND HISTORY: Problem focused assessment: Including review of records relating to presenting problem.  CLINICAL DECISION MAKING: Moderate - several treatment options, min-mod task modification necessary  REHAB POTENTIAL: Good  EVALUATION COMPLEXITY: Moderate    PLAN:  OT FREQUENCY: 1-2x/week  OT DURATION: 12 weeks  PLANNED INTERVENTIONS: self care/ADL training, therapeutic exercise, therapeutic activity, neuromuscular re-education, manual therapy, passive range of motion, balance training, splinting, electrical stimulation, paraffin, moist heat, cryotherapy, contrast bath, patient/family education, coping strategies training, and DME and/or AE instructions  RECOMMENDED OTHER SERVICES: None at this time  CONSULTED AND AGREED WITH PLAN OF CARE: Patient  PLAN FOR NEXT SESSION: see above  Danelle Earthly, MS, OTR/L  Ricardo Yang, OT 03/27/2023,  9:24 PM

## 2023-03-30 ENCOUNTER — Ambulatory Visit: Payer: Medicaid Other | Attending: Family Medicine

## 2023-03-30 ENCOUNTER — Ambulatory Visit: Payer: Medicaid Other | Admitting: Physical Therapy

## 2023-03-30 DIAGNOSIS — I63512 Cerebral infarction due to unspecified occlusion or stenosis of left middle cerebral artery: Secondary | ICD-10-CM | POA: Diagnosis present

## 2023-03-30 DIAGNOSIS — M6281 Muscle weakness (generalized): Secondary | ICD-10-CM | POA: Insufficient documentation

## 2023-03-30 DIAGNOSIS — R278 Other lack of coordination: Secondary | ICD-10-CM | POA: Diagnosis present

## 2023-03-30 NOTE — Therapy (Signed)
OUTPATIENT OCCUPATIONAL THERAPY NEURO TREATMENT NOTE  Patient Name: Ricardo Yang MRN: [id number] DOB:1976-02-16, 47 y.o., male Today's Date: 03/30/2023  PCP: Dr. Joseph Berkshire REFERRING PROVIDER: Dr. Joseph Berkshire  END OF SESSION:  OT End of Session - 03/30/23 0922     Visit Number 16    Number of Visits 24    Date for OT Re-Evaluation 04/22/23    Authorization Time Period Reporting period beginning 03/06/23    Progress Note Due on Visit 10    OT Start Time 0920    OT Stop Time 1020    OT Time Calculation (min) 60 min    Equipment Utilized During Treatment none    Activity Tolerance Patient tolerated treatment well    Behavior During Therapy [facility] for tasks assessed/performed            Past Medical History:  Diagnosis Date   Acute CVA (cerebrovascular accident) (HCC) 10/04/2022   Acute ST elevation myocardial infarction (STEMI) involving left anterior descending (LAD) coronary artery (HCC) 08/06/2019   Anxiety 09/2022   After stroke   CAD (coronary artery disease) 10/04/2022   Cerebrovascular accident (CVA) (HCC) 10/07/2022   Chest pain    CHF (congestive heart failure) (HCC)    Cocaine abuse (HCC)    Cocaine use 10/06/2022   Depression December 2023   When stroke happened   Elevated troponin    Encounter for imaging to screen for metal prior to MRI 10/07/2022   Heavy smoker    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 question possible MS   NSTEMI (non-ST elevated myocardial infarction) (HCC) 05/25/2015   Obesity    Polysubstance abuse (HCC)    a. tobacco, cocaine, crack, ecstasy, pills, "anything but injections"   Primary hypertension 10/05/2022   Sleep apnea    Always   STEMI (ST elevation myocardial infarction) (HCC) 08/06/2019   Past Surgical History:  Procedure Laterality Date   CARDIAC CATHETERIZATION N/A 05/28/2015   Procedure: Left Heart Cath and Coronary Angiography;  Surgeon: Antonieta Iba, MD;   Location: [facility] INVASIVE CV LAB;  Service: Cardiovascular;  Laterality: N/A;   CORONARY ANGIOGRAPHY N/A 04/22/2021   Procedure: CORONARY ANGIOGRAPHY;  Surgeon: Marcina Millard, MD;  Location: [facility] INVASIVE CV LAB;  Service: Cardiovascular;  Laterality: N/A;   CORONARY/GRAFT ACUTE MI REVASCULARIZATION N/A 08/06/2019   Procedure: Coronary/Graft Acute MI Revascularization;  Surgeon: Marcina Millard, MD;  Location: [facility] INVASIVE CV LAB;  Service: Cardiovascular;  Laterality: N/A;   LEFT HEART CATH N/A 04/22/2021   Procedure: Left Heart Cath;  Surgeon: Marcina Millard, MD;  Location: [facility] INVASIVE CV LAB;  Service: Cardiovascular;  Laterality: N/A;   LEFT HEART CATH AND CORONARY ANGIOGRAPHY N/A 08/06/2019   Procedure: LEFT HEART CATH AND CORONARY ANGIOGRAPHY;  Surgeon: Marcina Millard, MD;  Location: [facility] INVASIVE CV LAB;  Service: Cardiovascular;  Laterality: N/A;   TEE WITHOUT CARDIOVERSION N/A 10/08/2022   Procedure: TRANSESOPHAGEAL ECHOCARDIOGRAM (TEE);  Surgeon: Debbe Odea, MD;  Location: [facility] ORS;  Service: Cardiovascular;  Laterality: N/A;  11:30am   Patient Active Problem List   Diagnosis Date Noted   Sleep apnea 01/01/2023   Dysfunction of right rotator cuff 01/01/2023   History of CVA with residual deficit 12/04/2022   Mixed hyperlipidemia 12/04/2022   History of MI (myocardial infarction) 12/04/2022   Insomnia 10/17/2022   Slow transit constipation 10/17/2022   Chronic pain of both knees 10/17/2022   History of substance abuse (HCC) 10/15/2022  HFrEF (heart failure with reduced ejection fraction) (HCC) 10/07/2022   Expressive aphasia 10/05/2022   Right hemiplegia (HCC) 10/05/2022   CAD S/P percutaneous coronary angioplasty 10/05/2022   Polysubstance abuse (HCC) 10/04/2022   Essential hypertension 04/22/2021   Ischemic chest pain [facility])    ONSET DATE: 10/04/22  REFERRING DIAG: I63.9 (ICD-10-CM) - CVA (cerebral vascular accident)   THERAPY DIAG:   Muscle weakness (generalized)  Other lack of coordination  Left middle cerebral artery stroke [facility])  Rationale for Evaluation and Treatment: Rehabilitation  SUBJECTIVE:  SUBJECTIVE STATEMENT: Pt reports he was able to move back home with his mother on Friday.   Pt accompanied by: self  PERTINENT HISTORY: CVA in 12/23. Multiple Cardiac caths over the last 5 years, hx of polysubstance abuse  PRECAUTIONS: None  WEIGHT BEARING RESTRICTIONS: No  PAIN:  Are you having pain? Yes: NPRS scale: 0-1/10 Pain location: RUE  Pain description: sharp and achy and burning Aggravating factors: end range stretch Relieving factors: heat, stretch, using the arm  FALLS: Has patient fallen in last 6 months? 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    PATIENT GOALS: "If I could use my hand 75%.  And to use my hand to use the bathroom."  Pt enjoyed playing cards and shooting pool, playing video games prior to CVA.  OBJECTIVE:   HAND DOMINANCE: Right  ADLs: Overall ADLs: performing ADLs predominantly with the L non-dominant hand Transfers/ambulation related to ADLs: without AD Eating: 1 handed cutting, L hand only Grooming: electric toothbrush, L hand only UB Dressing: assist with buttons and belt  LB Dressing: assist to tie shoes; performs all L handed Toileting: using L hand for toilet hygiene and clothing management Bathing: L hand only Tub Shower transfers: indep with tub shower transfer Equipment: none  IADLs: Shopping: able to manage light shopping trips, but typically goes with a family member for the driving  assistance Light housekeeping: L hand only  Meal Prep: cold meal prep, some hot meal prep when able to use L hand only Community mobility: Pt reports he has not regularly driven since CVA, but has driven a couple of times to the store.  Ambulatory without AD. Medication management: easy open pill bottles and pt uses a pill organizer and sets up independently  Financial management: pt reports he has no current bills Handwriting:  Holds a pen loosely with R hand and can make a mark on paper but not able to write his initials   MOBILITY STATUS:  ambulatory without AD; see PT note for details.  POSTURE COMMENTS:  R sided hemiparesis Sitting balance: Moves/returns truncal midpoint >2 inches in all planes  ACTIVITY TOLERANCE: Activity tolerance: WFL for assessment, to be further assessed in future visits  FUNCTIONAL OUTCOME MEASURES: FOTO: 37 ; predicted 43 03/06/23: 44.8  UPPER EXTREMITY ROM:    Active ROM Right eval Left  Eval WNL Right 03/06/23  Shoulder flexion 95  125  Shoulder abduction     Shoulder adduction 68  118  Shoulder extension     Shoulder internal rotation Thumb to posterior hip  Thumb to mid lower  back  Shoulder external rotation Thumb to R ear with slight chin tuck and head tilt  Thumb to posterior R ear without chin tuck or head tilt  Elbow flexion     Elbow extension     Wrist flexion     Wrist extension     Wrist ulnar deviation     Wrist radial deviation     Wrist pronation     Wrist supination     (Blank rows = not tested)  UPPER EXTREMITY MMT:     MMT Left eval Right eval Right 03/06/23  Shoulder flexion 5 3- 3+ (within available range)  Shoulder abduction 5 3- 3+ (within available range)  Shoulder adduction     Shoulder extension     Shoulder internal rotation 5 3- 4- (elbow at side)  Shoulder external rotation 5 3- 3+ (elbow at side)  Middle trapezius     Lower trapezius     Elbow flexion 5 3+ 4  Elbow extension 5 3+ 4  Wrist flexion 5 4-  4  Wrist extension 5 4 4+  Wrist ulnar deviation     Wrist radial deviation     Wrist pronation 5 3+ 4  Wrist supination 5 3- 3+  (Blank rows = not tested)  HAND FUNCTION: Grip strength: Right: (3rd level on dynamometer) 4 lbs; Left: 108 lbs, Lateral pinch: Right: 2 lbs, Left: 22 lbs, and 3 point pinch: Right: 0 lbs, Left: 21 lbs (Saehan pinch gauge) 03/06/23: Grip strength: Right: (3rd level on dynamometer) 8 lbs; Left: 105 lbs, Lateral pinch: Right: 6 lbs, Left: 27 lbs, and 3 point pinch: Right: 4 lbs, Left: 17 lbs  COORDINATION: Finger Nose Finger test: RUE with difficulty 9 Hole Peg test: Right: unable sec; Left: 31.5 sec 03/06/23: R: unable; was able to remove a peg to place back into dish  SENSATION: Light touch: Impaired  pins and needles down the arm   EDEMA: R hand moderately edematous: 24 cm circumferentially at the MCPs of digits 2-5 (L 22.5 cm) 03/06/23: 23.3 cm  MUSCLE TONE: RUE: Mild and Hypertonic  COGNITION: Overall cognitive status: Within functional limits for tasks assessed  VISION: Baseline vision: No visual deficits  VISION ASSESSMENT: WFL  PERCEPTION: WFL  PRAXIS: Impaired: Motor planning  TODAY'S TREATMENT:                                                                                                                              DATE: 03/30/23 Moist heat applied to R shoulder for pain reduction/muscle relaxation during completion of therapeutic exercises and neuro re-ed activities noted below.    Therapeutic Exercise: In sitting performed reps of passive stretching for R shoulder ER/IR, R forearm supination, and R wrist and digit extension in prep for engaging the RUE into neuro re-ed activities noted below.  Instructed pt in wrist and forearm strengthening with a 1# dumbbell: Wrist flex, ext, RD, pron/sup x3 sets 10 reps each and  cues for positioning and stabilizing R forearm with L hand.  Written handouts issued to add to HEP.  Completed R intrinsic  hand strengthening for MP, PIP, and DIP flex/ext, digit abd, thumb palmar abd/add sliding thumb on tennis ball with min A.  Completed single digit taps x10 reps each and manual assist to stabilize alternate digits.  Facilitated R IF PIP and DIP ext with pen rolling on table top; requires min A to complete the movement.  Neuro re-ed: Pt practiced picking up and placing jumbo pegs from table top (non-skid towel surface) and placing pegs into pegboard using R hand.  Pt was able to manage moving pegs from horiz to vertical position this date maintaining his grasp and rotating wrist and forearm in prep to place pegs into pegboard.  Pt then practiced removing pegs using a lateral pinch, a 3 point pinch, and a 5 fingered grasp, with vc for passive digit extension and WB through the hand on table top in between every few reps for minimizing flexor tone in the hand.  Pt required max A to formulate a 3 point pinch as PIPs curl into a flexor pattern.  PATIENT EDUCATION: Education details: R wrist and forearm strengthening Person educated: Patient Education method: Explanation, demo, written handout, tactile/vc Education comprehension: verbalized understanding, demonstrated understanding  HOME EXERCISE PROGRAM: Theraputty exercises, cane stretches, digit AROM/AAROM/PROM/place and hold exercises for digit extension; lateral and 3 point pinching to grasp small/light items from table top.  GOALS: Goals reviewed with patient? Yes  SHORT TERM GOALS: Target date: 03/11/23 (6 weeks)  Pt will be indep to perform HEP for improving RUE strength and coordination. Baseline: Eval: not yet initiated; 03/06/23: indep and consistent with current program; will continue to progress as necessary Goal status: achieved  LONG TERM GOALS: Target date: 04/22/23 (12 weeks)  Pt will increase FOTO score to 43 or better to indicate improvement in self perceived functional use of the R arm with daily tasks. Baseline: Eval: 37;  03/06/23: 44.8 Goal status: achieved/ongoing  2.  Pt will increase R grip strength by 15 or more lbs in order to improve ability to hold and carry light ADL supplies in R dominant hand.    Baseline: Eval: R grip 4 lbs, L 108 lbs (3rd slot on dynamometer); 03/06/23: R grip 8 lbs Goal status: ongoing  3.  Pt will increase R lateral pinch strength to be able to manipulate playing cards with minimal dropping. Baseline: Eval: R 2 lbs (L 22 lbs); pt has not attempted playing cards since CVA; 03/06/23: R lateral pinch 6 lbs Goal status: ongoing  4.  Pt will improve FMC/dexterity skills in R hand to be able to engage the R hand to assist the L with clothing fasteners. Baseline: Eval: R 9 hole (unable), L 31.5 sec; manages clothing fasteners with L hand only; 03/06/23: R 9 hole: unable; R hand can grossly grasp clothing to help with stabilizing while L hand manages fasteners. Goal status: ongoing  5.  Pt will increase RUE strength and coordination to be able to use the RUE as a good assist to the L during ADL/IADL tasks. Baseline: Eval: manages all ADLs with the L non-dominant hand; 03/06/23: Pt is using R hand as a stabilizer at least 75% of the time with ADLs; all strength measures improving throughout RUE (see chart above) and pt uses R hand as a poor-fair assist. Goal status: achieved/revised/ongoing  6.  Pt will sign name for documents with R dominant hand, adapted pen  as needed, with 100% legibility and demonstrate ability to isolate wrist and finger movements from forearm and upper arm while writing. Baseline: Eval: Pt holds standard pen and can make marks on paper but not yet formulate letters of his name; 03/06/23: pt can print name with 90% legibility using a built up foam grip on a standard pen (pt does yet not isolate finger and wrist movements from the forearm and upper arm when writing) Goal status: revised/ongoing  ASSESSMENT: CLINICAL IMPRESSION: Pt continues to make steady progress with OT  goals.  Initiated wrist and forearm resistive exercises this date with a 1# dumbbell with good tolerance.  Pt used L hand to position and stabilize R forearm for wrist strengthening and was able to follow written handout with min vc.  Pt encouraged to add these exercises to his daily HEP.  Focused on R hand intrinsic strengthening and digit isolation exercises this date to progress pt to more advanced grasp patterns.  Pt using a raking grasp when placing and removing jumbo pegs and requires max A to formulate a 3 point pinch as PIPs curl into a flexor pattern.  Pt continues to improve with extending digits 1-5 simultaneously, but requires manual assist to isolate single digits.  Pt continues to present with RUE weakness, still mild edema in the R hand, GMC/FMC deficits, decreased sensation to light touch, and mild hypertonicity throughout the RUE.  Pt will continue to benefit from skilled OT to address above noted deficits in order to increase indep with ADLs and improve QOL.  PERFORMANCE DEFICITS: in functional skills including ADLs, IADLs, coordination, dexterity, sensation, edema, tone, ROM, strength, pain, flexibility, Fine motor control, Gross motor control, balance, body mechanics, decreased knowledge of use of DME, and UE functional use, and psychosocial skills including coping strategies, habits, and routines and behaviors.   IMPAIRMENTS: are limiting patient from ADLs, IADLs, work, leisure, and social participation.   CO-MORBIDITIES: may have co-morbidities  that affects occupational performance. Patient will benefit from skilled OT to address above impairments and improve overall function.  MODIFICATION OR ASSISTANCE TO COMPLETE EVALUATION: No modification of tasks or assist necessary to complete an evaluation.  OT OCCUPATIONAL PROFILE AND HISTORY: Problem focused assessment: Including review of records relating to presenting problem.  CLINICAL DECISION MAKING: Moderate - several treatment  options, min-mod task modification necessary  REHAB POTENTIAL: Good  EVALUATION COMPLEXITY: Moderate    PLAN:  OT FREQUENCY: 1-2x/week  OT DURATION: 12 weeks  PLANNED INTERVENTIONS: self care/ADL training, therapeutic exercise, therapeutic activity, neuromuscular re-education, manual therapy, passive range of motion, balance training, splinting, electrical stimulation, paraffin, moist heat, cryotherapy, contrast bath, patient/family education, coping strategies training, and DME and/or AE instructions  RECOMMENDED OTHER SERVICES: None at this time  CONSULTED AND AGREED WITH PLAN OF CARE: Patient  PLAN FOR NEXT SESSION: see above  Danelle Earthly, MS, OTR/L  [employer] Dials, OT 03/30/2023, 10:24 AM

## 2023-03-31 ENCOUNTER — Ambulatory Visit: Payer: Medicaid Other | Admitting: Physical Therapy

## 2023-04-01 ENCOUNTER — Ambulatory Visit: Payer: Medicaid Other | Admitting: Physical Therapy

## 2023-04-01 ENCOUNTER — Ambulatory Visit: Payer: Medicaid Other

## 2023-04-01 DIAGNOSIS — I63512 Cerebral infarction due to unspecified occlusion or stenosis of left middle cerebral artery: Secondary | ICD-10-CM

## 2023-04-01 DIAGNOSIS — R278 Other lack of coordination: Secondary | ICD-10-CM

## 2023-04-01 DIAGNOSIS — M6281 Muscle weakness (generalized): Secondary | ICD-10-CM | POA: Diagnosis not present

## 2023-04-01 NOTE — Therapy (Signed)
OUTPATIENT OCCUPATIONAL THERAPY NEURO TREATMENT NOTE  Patient Name: Ricardo Yang MRN: [id number] DOB:[birth year], 47 y.o., male Today's Date: 04/01/2023  PCP: Dr. Joseph Berkshire REFERRING PROVIDER: Dr. Joseph Berkshire  END OF SESSION:  OT End of Session - 04/01/23 0935     Visit Number 17    Number of Visits 24    Date for OT Re-Evaluation 04/22/23    Authorization Time Period Reporting period beginning 03/06/23    Progress Note Due on Visit 10    OT Start Time 0915    OT Stop Time 1015    OT Time Calculation (min) 60 min    Equipment Utilized During Treatment none    Activity Tolerance Patient tolerated treatment well    Behavior During Therapy [facility] for tasks assessed/performed            Past Medical History:  Diagnosis Date   Acute CVA (cerebrovascular accident) (HCC) 10/04/2022   Acute ST elevation myocardial infarction (STEMI) involving left anterior descending (LAD) coronary artery (HCC) 08/06/2019   Anxiety 09/2022   After stroke   CAD (coronary artery disease) 10/04/2022   Cerebrovascular accident (CVA) (HCC) 10/07/2022   Chest pain    CHF (congestive heart failure) (HCC)    Cocaine abuse (HCC)    Cocaine use 10/06/2022   Depression December 2023   When stroke happened   Elevated troponin    Encounter for imaging to screen for metal prior to MRI 10/07/2022   Heavy smoker    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 question possible MS   NSTEMI (non-ST elevated myocardial infarction) (HCC) 05/25/2015   Obesity    Polysubstance abuse (HCC)    a. tobacco, cocaine, crack, ecstasy, pills, "anything but injections"   Primary hypertension 10/05/2022   Sleep apnea    Always   STEMI (ST elevation myocardial infarction) (HCC) 08/06/2019   Past Surgical History:  Procedure Laterality Date   CARDIAC CATHETERIZATION N/A 05/28/2015   Procedure: Left Heart Cath and Coronary Angiography;  Surgeon: Antonieta Iba, MD;   Location: [facility] INVASIVE CV LAB;  Service: Cardiovascular;  Laterality: N/A;   CORONARY ANGIOGRAPHY N/A 04/22/2021   Procedure: CORONARY ANGIOGRAPHY;  Surgeon: Marcina Millard, MD;  Location: [facility] INVASIVE CV LAB;  Service: Cardiovascular;  Laterality: N/A;   CORONARY/GRAFT ACUTE MI REVASCULARIZATION N/A 08/06/2019   Procedure: Coronary/Graft Acute MI Revascularization;  Surgeon: Marcina Millard, MD;  Location: [facility] INVASIVE CV [facility];  Service: Cardiovascular;  Laterality: N/A;   LEFT HEART CATH N/A 04/22/2021   Procedure: Left Heart Cath;  Surgeon: Marcina Millard, MD;  Location: [facility] INVASIVE CV LAB;  Service: Cardiovascular;  Laterality: N/A;   LEFT HEART CATH AND CORONARY ANGIOGRAPHY N/A 08/06/2019   Procedure: LEFT HEART CATH AND CORONARY ANGIOGRAPHY;  Surgeon: Marcina Millard, MD;  Location: [facility] INVASIVE CV LAB;  Service: Cardiovascular;  Laterality: N/A;   TEE WITHOUT CARDIOVERSION N/A 10/08/2022   Procedure: TRANSESOPHAGEAL ECHOCARDIOGRAM (TEE);  Surgeon: Debbe Odea, MD;  Location: [facility] ORS;  Service: Cardiovascular;  Laterality: N/A;  11:30am   Patient Active Problem List   Diagnosis Date Noted   Sleep apnea 01/01/2023   Dysfunction of right rotator cuff 01/01/2023   History of CVA with residual deficit 12/04/2022   Mixed hyperlipidemia 12/04/2022   History of MI (myocardial infarction) 12/04/2022   Insomnia 10/17/2022   Slow transit constipation 10/17/2022   Chronic pain of both knees 10/17/2022   History of substance abuse (HCC) 10/15/2022  HFrEF (heart failure with reduced ejection fraction) (HCC) 10/07/2022   Expressive aphasia 10/05/2022   Right hemiplegia (HCC) 10/05/2022   CAD S/P percutaneous coronary angioplasty 10/05/2022   Polysubstance abuse (HCC) 10/04/2022   Essential hypertension 04/22/2021   Ischemic chest pain (HCC)    ONSET DATE: 10/04/22  REFERRING DIAG: I63.9 (ICD-10-CM) - CVA (cerebral vascular accident)   THERAPY DIAG:   Muscle weakness (generalized)  Other lack of coordination  Left middle cerebral artery stroke (HCC)  Rationale for Evaluation and Treatment: Rehabilitation  SUBJECTIVE:  SUBJECTIVE STATEMENT: Pt reports noticing continued improvements in his hand, and stated that he no longer has to hold a container/jar/bottle between his knees to stabilize it while the L hand opens the lid, as he can now squeeze with enough strength in his R hand to stabilize the item.  Pt accompanied by: self  PERTINENT HISTORY: CVA in 12/23. Multiple Cardiac caths over the last 5 years, hx of polysubstance abuse  PRECAUTIONS: None  WEIGHT BEARING RESTRICTIONS: No  PAIN:  Are you having pain? Yes: NPRS scale: 0-1/10 Pain location: RUE  Pain description: sharp and achy and burning Aggravating factors: end range stretch Relieving factors: heat, stretch, using the arm  FALLS: Has patient fallen in last 6 months? 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    PATIENT GOALS: "If I could use my hand 75%.  And to use my hand to use the bathroom."  Pt enjoyed playing cards and shooting pool, playing video games prior to CVA.  OBJECTIVE:  HAND DOMINANCE: Right  ADLs: Overall ADLs: performing ADLs predominantly with the L non-dominant hand Transfers/ambulation related to ADLs: without AD Eating: 1 handed cutting, L hand only Grooming: electric toothbrush, L hand only UB Dressing: assist with buttons and belt  LB Dressing: assist to tie shoes; performs all L handed Toileting: using L hand for toilet hygiene and clothing management Bathing: L hand  only Tub Shower transfers: indep with tub shower transfer Equipment: none  IADLs: Shopping: able to manage light shopping trips, but typically goes with a family member for the driving assistance Light housekeeping: L hand only  Meal Prep: cold meal prep, some hot meal prep when able to use L hand only Community mobility: Pt reports he has not regularly driven since CVA, but has driven a couple of times to the store.  Ambulatory without AD. Medication management: easy open pill bottles and pt uses a pill organizer and sets up independently  Financial management: pt reports he has no current bills Handwriting:  Holds a pen loosely with R hand and can make a mark on paper but not able to write his initials   MOBILITY STATUS:  ambulatory without AD; see PT note for details.  POSTURE COMMENTS:  R sided hemiparesis Sitting balance: Moves/returns truncal midpoint >2 inches in all planes  ACTIVITY TOLERANCE: Activity tolerance: WFL for assessment, to be further assessed in future visits  FUNCTIONAL OUTCOME MEASURES: FOTO: 37 ; predicted 43 03/06/23: 44.8  UPPER EXTREMITY ROM:    Active ROM Right eval Left  Eval WNL Right 03/06/23  Shoulder flexion 95  125  Shoulder abduction     Shoulder adduction 68  118  Shoulder extension     Shoulder internal rotation Thumb to posterior hip  Thumb to mid lower back  Shoulder external rotation Thumb to R ear with slight chin tuck and head tilt  Thumb to posterior R ear without chin tuck or head tilt  Elbow flexion     Elbow extension     Wrist flexion     Wrist extension     Wrist ulnar deviation     Wrist radial deviation     Wrist pronation     Wrist supination     (Blank rows = not tested)  UPPER EXTREMITY MMT:     MMT Left eval Right eval Right 03/06/23  Shoulder flexion 5 3- 3+ (within available range)  Shoulder abduction 5 3- 3+ (within available range)  Shoulder adduction     Shoulder extension     Shoulder internal rotation  5 3- 4- (elbow at side)  Shoulder external rotation 5 3- 3+ (elbow at side)  Middle trapezius     Lower trapezius     Elbow flexion 5 3+ 4  Elbow extension 5 3+ 4  Wrist flexion 5 4- 4  Wrist extension 5 4 4+  Wrist ulnar deviation     Wrist radial deviation     Wrist pronation 5 3+ 4  Wrist supination 5 3- 3+  (Blank rows = not tested)  HAND FUNCTION: Grip strength: Right: (3rd level on dynamometer) 4 lbs; Left: 108 lbs, Lateral pinch: Right: 2 lbs, Left: 22 lbs, and 3 point pinch: Right: 0 lbs, Left: 21 lbs (Saehan pinch gauge) 03/06/23: Grip strength: Right: (3rd level on dynamometer) 8 lbs; Left: 105 lbs, Lateral pinch: Right: 6 lbs, Left: 27 lbs, and 3 point pinch: Right: 4 lbs, Left: 17 lbs  COORDINATION: Finger Nose Finger test: RUE with difficulty 9 Hole Peg test: Right: unable sec; Left: 31.5 sec 03/06/23: R: unable; was able to remove a peg to place back into dish  SENSATION: Light touch: Impaired  pins and needles down the arm   EDEMA: R hand moderately edematous: 24 cm circumferentially at the MCPs of digits 2-5 (L 22.5 cm) 03/06/23: 23.3 cm  MUSCLE TONE: RUE: Mild and Hypertonic  COGNITION: Overall cognitive status: Within functional limits for tasks assessed  VISION: Baseline vision: No visual deficits  VISION ASSESSMENT: WFL  PERCEPTION: WFL  PRAXIS: Impaired: Motor planning  TODAY'S TREATMENT:                                                                                                                              DATE: 04/01/23 Moist heat applied to R shoulder for pain reduction/muscle relaxation during completion of therapeutic exercises and neuro re-ed activities noted below.    Therapeutic Exercise: In sitting performed reps of passive stretching for R shoulder ER/IR, shoulder flex, abd, horiz abd/add, R forearm supination, and R wrist and  digit extension in prep for engaging the RUE into neuro re-ed activities noted below.  Completed R intrinsic hand  strengthening for MP, PIP, and DIP flex/ext, digit abd.  Completed single digit taps for R IF only, x2 sets 10 reps providing manual assist to stabilize alternate digits.  Facilitated R IF PIP and DIP ext  with OT blocking PIP joint and providing visual cue/target for extension of fingertip.  Neuro re-ed: Pt practiced picking up [place] discs (from non-skid towel surface) and placing discs into grid using R hand.  Pt practiced maintaining a light 2 point pinch to prevent worsening spasticity which manifests into increased PIP and DIP flexion, resulting in a raking grasp.  Pt was able to use a 2 point pinch to successfully move 20+ discs.  Pt was able to move several discs this date using a loose/light 3 point pinch pattern, though R LF grasped the discs from the lateral aspect of the DIP joint, not yet the tip, but noted improvement in maintaining slight PIP extension as compared to last session.  Pt intermittently required vc to perform passive digit extension and WB through the hand on table top in between every few reps for minimizing flexor tone in the hand.   PATIENT EDUCATION: Education details: 2 point and 3 point pinch prehension patterns Person educated: Patient Education method: Explanation, demo, vc/tactile cues Education comprehension: verbalized understanding, demonstrated understanding  HOME EXERCISE PROGRAM: Theraputty exercises, cane stretches, digit AROM/AAROM/PROM/place and hold exercises for digit extension; lateral and 3 point pinching to grasp small/light items from table top.  GOALS: Goals reviewed with patient? Yes  SHORT TERM GOALS: Target date: 03/11/23 (6 weeks)  Pt will be indep to perform HEP for improving RUE strength and coordination. Baseline: Eval: not yet initiated; 03/06/23: indep and consistent with current program; will continue to progress as necessary Goal status: achieved  LONG TERM GOALS: Target date: 04/22/23 (12 weeks)  Pt will increase FOTO score to  43 or better to indicate improvement in self perceived functional use of the R arm with daily tasks. Baseline: Eval: 37; 03/06/23: 44.8 Goal status: achieved/ongoing  2.  Pt will increase R grip strength by 15 or more lbs in order to improve ability to hold and carry light ADL supplies in R dominant hand.    Baseline: Eval: R grip 4 lbs, L 108 lbs (3rd slot on dynamometer); 03/06/23: R grip 8 lbs Goal status: ongoing  3.  Pt will increase R lateral pinch strength to be able to manipulate playing cards with minimal dropping. Baseline: Eval: R 2 lbs (L 22 lbs); pt has not attempted playing cards since CVA; 03/06/23: R lateral pinch 6 lbs Goal status: ongoing  4.  Pt will improve FMC/dexterity skills in R hand to be able to engage the R hand to assist the L with clothing fasteners. Baseline: Eval: R 9 hole (unable), L 31.5 sec; manages clothing fasteners with L hand only; 03/06/23: R 9 hole: unable; R hand can grossly grasp clothing to help with stabilizing while L hand manages fasteners. Goal status: ongoing  5.  Pt will increase RUE strength and coordination to be able to use the RUE as a good assist to the L during ADL/IADL tasks. Baseline: Eval: manages all ADLs with the L non-dominant hand; 03/06/23: Pt is using R hand as a stabilizer at least 75% of the time with ADLs; all strength measures improving throughout RUE (see chart above) and pt uses R hand as a poor-fair assist. Goal status:  achieved/revised/ongoing  6.  Pt will sign name for documents with R dominant hand, adapted pen as needed, with 100% legibility and demonstrate ability to isolate wrist and finger movements from forearm and upper arm while writing. Baseline: Eval: Pt holds standard pen and can make marks on paper but not yet formulate letters of his name; 03/06/23: pt can print name with 90% legibility using a built up foam grip on a standard pen (pt does yet not isolate finger and wrist movements from the forearm and upper arm when  writing) Goal status: revised/ongoing  ASSESSMENT: CLINICAL IMPRESSION: Pt continues to increase functional use of the R hand and stated that he no longer has to hold a container/jar/bottle between his knees to stabilize it while the L hand opens the lid, as he can now squeeze with enough strength in his R hand to stabilize the item.  Pt was able to use a 2 point pinch to successfully move 20+ [place] discs this date with better maintenance of PIP ext.  Pt was able to move several discs using a loose/light 3 point pinch pattern, though R LF grasped the discs from the lateral aspect of the DIP joint, not yet the tip, but noted improvement in maintaining slight PIP extension as compared to last session.  Pt intermittently required vc to perform passive digit extension and WB through the hand on table top in between every few reps for minimizing flexor tone in the hand. Pt continues to present with RUE weakness, still mild edema in the R hand, GMC/FMC deficits, decreased sensation to light touch, and mild hypertonicity throughout the RUE.  Pt will continue to benefit from skilled OT to address above noted deficits in order to increase indep with ADLs and improve QOL.  PERFORMANCE DEFICITS: in functional skills including ADLs, IADLs, coordination, dexterity, sensation, edema, tone, ROM, strength, pain, flexibility, Fine motor control, Gross motor control, balance, body mechanics, decreased knowledge of use of DME, and UE functional use, and psychosocial skills including coping strategies, habits, and routines and behaviors.   IMPAIRMENTS: are limiting patient from ADLs, IADLs, work, leisure, and social participation.   CO-MORBIDITIES: may have co-morbidities  that affects occupational performance. Patient will benefit from skilled OT to address above impairments and improve overall function.  MODIFICATION OR ASSISTANCE TO COMPLETE EVALUATION: No modification of tasks or assist necessary to complete an  evaluation.  OT OCCUPATIONAL PROFILE AND HISTORY: Problem focused assessment: Including review of records relating to presenting problem.  CLINICAL DECISION MAKING: Moderate - several treatment options, min-mod task modification necessary  REHAB POTENTIAL: Good  EVALUATION COMPLEXITY: Moderate    PLAN:  OT FREQUENCY: 1-2x/week  OT DURATION: 12 weeks  PLANNED INTERVENTIONS: self care/ADL training, therapeutic exercise, therapeutic activity, neuromuscular re-education, manual therapy, passive range of motion, balance training, splinting, electrical stimulation, paraffin, moist heat, cryotherapy, contrast bath, patient/family education, coping strategies training, and DME and/or AE instructions  RECOMMENDED OTHER SERVICES: None at this time  CONSULTED AND AGREED WITH PLAN OF CARE: Patient  PLAN FOR NEXT SESSION: see above  Danelle Earthly, MS, OTR/L  Otis Dials, OT 04/01/2023, 1:13 PM

## 2023-04-02 ENCOUNTER — Ambulatory Visit: Payer: Medicaid Other | Admitting: Physical Therapy

## 2023-04-02 ENCOUNTER — Other Ambulatory Visit: Payer: Self-pay

## 2023-04-06 ENCOUNTER — Ambulatory Visit: Payer: Medicaid Other

## 2023-04-06 DIAGNOSIS — M6281 Muscle weakness (generalized): Secondary | ICD-10-CM

## 2023-04-06 DIAGNOSIS — R278 Other lack of coordination: Secondary | ICD-10-CM

## 2023-04-06 DIAGNOSIS — I63512 Cerebral infarction due to unspecified occlusion or stenosis of left middle cerebral artery: Secondary | ICD-10-CM

## 2023-04-07 ENCOUNTER — Ambulatory Visit: Payer: Medicaid Other | Admitting: Physical Therapy

## 2023-04-07 NOTE — Therapy (Signed)
OUTPATIENT OCCUPATIONAL THERAPY NEURO TREATMENT NOTE  Patient Name: Ricardo Yang MRN: [id number] DOB:1976-07-26, 47 y.o., male Today's Date: 04/07/2023  PCP: Dr. Joseph Berkshire REFERRING PROVIDER: Dr. Joseph Berkshire  END OF SESSION:  OT End of Session - 04/07/23 1238     Visit Number 18    Number of Visits 24    Date for OT Re-Evaluation 04/22/23    Authorization Time Period Reporting period beginning 03/06/23    Progress Note Due on Visit 10    OT Start Time 1345    OT Stop Time 1430    OT Time Calculation (min) 45 min    Equipment Utilized During Treatment none    Activity Tolerance Patient tolerated treatment well    Behavior During Therapy [facility] for tasks assessed/performed            Past Medical History:  Diagnosis Date   Acute CVA (cerebrovascular accident) (HCC) 10/04/2022   Acute ST elevation myocardial infarction (STEMI) involving left anterior descending (LAD) coronary artery (HCC) 08/06/2019   Anxiety 09/2022   After stroke   CAD (coronary artery disease) 10/04/2022   Cerebrovascular accident (CVA) (HCC) 10/07/2022   Chest pain    CHF (congestive heart failure) (HCC)    Cocaine abuse (HCC)    Cocaine use 10/06/2022   Depression December 2023   When stroke happened   Elevated troponin    Encounter for imaging to screen for metal prior to MRI 10/07/2022   Heavy smoker    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 question possible MS   NSTEMI (non-ST elevated myocardial infarction) (HCC) 05/25/2015   Obesity    Polysubstance abuse (HCC)    a. tobacco, cocaine, crack, ecstasy, pills, "anything but injections"   Primary hypertension 10/05/2022   Sleep apnea    Always   STEMI (ST elevation myocardial infarction) (HCC) 08/06/2019   Past Surgical History:  Procedure Laterality Date   CARDIAC CATHETERIZATION N/A 05/28/2015   Procedure: Left Heart Cath and Coronary Angiography;  Surgeon: Antonieta Iba, MD;   Location: [facility] INVASIVE CV LAB;  Service: Cardiovascular;  Laterality: N/A;   CORONARY ANGIOGRAPHY N/A 04/22/2021   Procedure: CORONARY ANGIOGRAPHY;  Surgeon: Marcina Millard, MD;  Location: [facility] INVASIVE CV LAB;  Service: Cardiovascular;  Laterality: N/A;   CORONARY/GRAFT ACUTE MI REVASCULARIZATION N/A 08/06/2019   Procedure: Coronary/Graft Acute MI Revascularization;  Surgeon: Marcina Millard, MD;  Location: [facility] INVASIVE CV LAB;  Service: Cardiovascular;  Laterality: N/A;   LEFT HEART CATH N/A 04/22/2021   Procedure: Left Heart Cath;  Surgeon: Marcina Millard, MD;  Location: [facility] INVASIVE CV LAB;  Service: Cardiovascular;  Laterality: N/A;   LEFT HEART CATH AND CORONARY ANGIOGRAPHY N/A 08/06/2019   Procedure: LEFT HEART CATH AND CORONARY ANGIOGRAPHY;  Surgeon: Marcina Millard, MD;  Location: [facility] INVASIVE CV LAB;  Service: Cardiovascular;  Laterality: N/A;   TEE WITHOUT CARDIOVERSION N/A 10/08/2022   Procedure: TRANSESOPHAGEAL ECHOCARDIOGRAM (TEE);  Surgeon: Debbe Odea, MD;  Location: [facility] ORS;  Service: Cardiovascular;  Laterality: N/A;  11:30am   Patient Active Problem List   Diagnosis Date Noted   Sleep apnea 01/01/2023   Dysfunction of right rotator cuff 01/01/2023   History of CVA with residual deficit 12/04/2022   Mixed hyperlipidemia 12/04/2022   History of MI (myocardial infarction) 12/04/2022   Insomnia 10/17/2022   Slow transit constipation 10/17/2022   Chronic pain of both knees 10/17/2022   History of substance abuse (HCC) 10/15/2022  HFrEF (heart failure with reduced ejection fraction) (HCC) 10/07/2022   Expressive aphasia 10/05/2022   Right hemiplegia (HCC) 10/05/2022   CAD S/P percutaneous coronary angioplasty 10/05/2022   Polysubstance abuse (HCC) 10/04/2022   Essential hypertension 04/22/2021   Ischemic chest pain [facility])    ONSET DATE: 10/04/22  REFERRING DIAG: I63.9 (ICD-10-CM) - CVA (cerebral vascular accident)   THERAPY DIAG:   Muscle weakness (generalized)  Other lack of coordination  Left middle cerebral artery stroke (HCC)  Rationale for Evaluation and Treatment: Rehabilitation  SUBJECTIVE:  SUBJECTIVE STATEMENT: Pt inquired about whether he would benefit from an Estim unit for the RUE.  See clinical impression below.  by: self  PERTINENT HISTORY: CVA in 12/23. Multiple Cardiac caths over the last 5 years, hx of polysubstance abuse  PRECAUTIONS: None  WEIGHT BEARING RESTRICTIONS: No  PAIN:  Are you having pain? Yes: NPRS scale: 0-1/10 Pain location: RUE  Pain description: sharp and achy and burning Aggravating factors: end range stretch Relieving factors: heat, stretch, using the arm  FALLS: Has patient fallen in last 6 months? 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    PATIENT GOALS: "If I could use my hand 75%.  And to use my hand to use the bathroom."  Pt enjoyed playing cards and shooting pool, playing video games prior to CVA.  OBJECTIVE:  HAND DOMINANCE: Right  ADLs: Overall ADLs: performing ADLs predominantly with the L non-dominant hand Transfers/ambulation related to ADLs: without AD Eating: 1 handed cutting, L hand only Grooming: electric toothbrush, L hand only UB Dressing: assist with buttons and belt  LB Dressing: assist to tie shoes; performs all L handed Toileting: using L hand for toilet hygiene and clothing management Bathing: L hand only Tub Shower transfers: indep with tub shower transfer Equipment: none  IADLs: Shopping: able to manage light shopping trips, but typically goes with a family member for  the driving assistance Light housekeeping: L hand only  Meal Prep: cold meal prep, some hot meal prep when able to use L hand only Community mobility: Pt reports he has not regularly driven since CVA, but has driven a couple of times to the store.  Ambulatory without AD. Medication management: easy open pill bottles and pt uses a pill organizer and sets up independently  Financial management: pt reports he has no current bills Handwriting:  Holds a pen loosely with R hand and can make a mark on paper but not able to write his initials   MOBILITY STATUS:  ambulatory without AD; see PT note for details.  POSTURE COMMENTS:  R sided hemiparesis Sitting balance: Moves/returns truncal midpoint >2 inches in all planes  ACTIVITY TOLERANCE: Activity tolerance: WFL for assessment, to be further assessed in future visits  FUNCTIONAL OUTCOME MEASURES: FOTO: 37 ; predicted 43 03/06/23: 44.8  UPPER EXTREMITY ROM:    Active ROM Right eval Left  Eval WNL Right 03/06/23  Shoulder flexion 95  125  Shoulder abduction     Shoulder adduction 68  118  Shoulder extension     Shoulder internal rotation Thumb to posterior hip  Thumb to mid  lower back  Shoulder external rotation Thumb to R ear with slight chin tuck and head tilt  Thumb to posterior R ear without chin tuck or head tilt  Elbow flexion     Elbow extension     Wrist flexion     Wrist extension     Wrist ulnar deviation     Wrist radial deviation     Wrist pronation     Wrist supination     (Blank rows = not tested)  UPPER EXTREMITY MMT:     MMT Left eval Right eval Right 03/06/23  Shoulder flexion 5 3- 3+ (within available range)  Shoulder abduction 5 3- 3+ (within available range)  Shoulder adduction     Shoulder extension     Shoulder internal rotation 5 3- 4- (elbow at side)  Shoulder external rotation 5 3- 3+ (elbow at side)  Middle trapezius     Lower trapezius     Elbow flexion 5 3+ 4  Elbow extension 5 3+ 4  Wrist  flexion 5 4- 4  Wrist extension 5 4 4+  Wrist ulnar deviation     Wrist radial deviation     Wrist pronation 5 3+ 4  Wrist supination 5 3- 3+  (Blank rows = not tested)  HAND FUNCTION: Grip strength: Right: (3rd level on dynamometer) 4 lbs; Left: 108 lbs, Lateral pinch: Right: 2 lbs, Left: 22 lbs, and 3 point pinch: Right: 0 lbs, Left: 21 lbs (S[place]han pinch gauge) 03/06/23: Grip strength: Right: (3rd level on dynamometer) 8 lbs; Left: 105 lbs, Lateral pinch: Right: 6 lbs, Left: 27 lbs, and 3 point pinch: Right: 4 lbs, Left: 17 lbs  COORDINATION: Finger Nose Finger test: RUE with difficulty 9 Hole Peg test: Right: unable sec; Left: 31.5 sec 03/06/23: R: unable; was able to remove a peg to place back into dish  SENSATION: Light touch: Impaired  pins and needles down the arm   EDEMA: R hand moderately edematous: 24 cm circumferentially at the MCPs of digits 2-5 (L 22.5 cm) 03/06/23: 23.3 cm  MUSCLE TONE: RUE: Mild and Hypertonic  COGNITION: Overall cognitive status: Within functional limits for tasks assessed  VISION: Baseline vision: No visual deficits  VISION ASSESSMENT: WFL  PERCEPTION: WFL  PRAXIS: Impaired: Motor planning  TODAY'S TREATMENT:                                                                                                                              DATE: 04/07/23 Moist heat applied to R shoulder for pain reduction/muscle relaxation during completion of therapeutic exercises and neuro re-ed activities noted below.    Therapeutic Exercise: In sitting performed reps of passive stretching for R shoulder ER/IR, shoulder flex, abd, horiz abd/add, R forearm supination, and R wrist and digit extension in prep for engaging the RUE into neuro re-ed activities noted below.  Completed R intrinsic hand strengthening for MP, PIP, and DIP flex/ext, digit abd.  Completed single  digit taps for digits 2-5, x2 sets 10 reps providing manual assist to stabilize alternate digits.   Facilitated R IF PIP and DIP ext with joint blocking at the MP and PIP joints; provided visual cue/target for extension of fingertip with each rep.  Neuro re-ed: Pt practiced picking up [place] discs from table top without a non-skid surface and placing discs into grid using R hand.  Pt practiced maintaining a light 2 point pinch to prevent worsening spasticity which manifests into increased PIP and DIP flexion, resulting in a raking grasp.  Pt intermittently required vc to perform passive digit extension and WB through the hand on table top in between every few reps for minimizing flexor tone in the hand.  Pt was able to move several discs this date using a loose/light 3 point pinch pattern, though R LF grasped the discs from the lateral aspect of the DIP joint, not yet the tip.  OT provided hand over hand assist to maintain a 3 point pinch on a disc providing manual pressure to 2nd and 3rd digit PIPs and thumb IP to maintain extension, but pt unable to maintain this prehension without manual assist.    PATIENT EDUCATION: Education details: Potential benefits of Estim unit and Botox for managing spasticity through the distal RUE. Person educated: Patient Education method: Explanation Education comprehension: verbalized understanding; pt plans to discuss with MD  HOME EXERCISE PROGRAM: Theraputty exercises, cane stretches, digit AROM/AAROM/PROM/place and hold exercises for digit extension; lateral and 3 point pinching to grasp small/light items from table top.  GOALS: Goals reviewed with patient? Yes  SHORT TERM GOALS: Target date: 03/11/23 (6 weeks)  Pt will be indep to perform HEP for improving RUE strength and coordination. Baseline: Eval: not yet initiated; 03/06/23: indep and consistent with current program; will continue to progress as necessary Goal status: achieved  LONG TERM GOALS: Target date: 04/22/23 (12 weeks)  Pt will increase FOTO score to 43 or better to indicate improvement  in self perceived functional use of the R arm with daily tasks. Baseline: Eval: 37; 03/06/23: 44.8 Goal status: achieved/ongoing  2.  Pt will increase R grip strength by 15 or more lbs in order to improve ability to hold and carry light ADL supplies in R dominant hand.    Baseline: Eval: R grip 4 lbs, L 108 lbs (3rd slot on dynamometer); 03/06/23: R grip 8 lbs Goal status: ongoing  3.  Pt will increase R lateral pinch strength to be able to manipulate playing cards with minimal dropping. Baseline: Eval: R 2 lbs (L 22 lbs); pt has not attempted playing cards since CVA; 03/06/23: R lateral pinch 6 lbs Goal status: ongoing  4.  Pt will improve FMC/dexterity skills in R hand to be able to engage the R hand to assist the L with clothing fasteners. Baseline: Eval: R 9 hole (unable), L 31.5 sec; manages clothing fasteners with L hand only; 03/06/23: R 9 hole: unable; R hand can grossly grasp clothing to help with stabilizing while L hand manages fasteners. Goal status: ongoing  5.  Pt will increase RUE strength and coordination to be able to use the RUE as a good assist to the L during ADL/IADL tasks. Baseline: Eval: manages all ADLs with the L non-dominant hand; 03/06/23: Pt is using R hand as a stabilizer at least 75% of the time with ADLs; all strength measures improving throughout RUE (see chart above) and pt uses R hand as a poor-fair assist. Goal status: achieved/revised/ongoing  6.  Pt will sign name for documents with R dominant hand, adapted pen as needed, with 100% legibility and demonstrate ability to isolate wrist and finger movements from forearm and upper arm while writing. Baseline: Eval: Pt holds standard pen and can make marks on paper but not yet formulate letters of his name; 03/06/23: pt can print name with 90% legibility using a built up foam grip on a standard pen (pt does yet not isolate finger and wrist movements from the forearm and upper arm when writing) Goal status:  revised/ongoing  ASSESSMENT: CLINICAL IMPRESSION: Pt continues to focus on increasing wrist and digit extensor strength for minimizing flexor patterns in R hand.  Pt inquired about an Estim unit (Pt may benefit from a [place] unit, but OT will look into whether pt would qualify for this with insurance coverage).  OT also encouraged pt to discuss possibility of Botox injections with MD to better manage RUE spasticity.  Pt continues to focus on increasing R shoulder and forearm mobility, and reports good consistency to perform his cane stretches and theraband exercises throughout the week.  Continued focus on moving [place] discs this date using a 2 point and 3 point pinch pattern.  Pt intermittently required vc to perform passive digit extension and WB through the hand on table top in between every few reps for minimizing flexor tone in the hand.  OT provided hand over hand assist to maintain a 3 point pinch on a disc providing manual pressure to 2nd and 3rd digit PIPs and thumb IP to maintain extension, but pt unable to maintain this prehension without manual assist.  Pt continues to present with RUE weakness, still mild edema in the R hand, GMC/FMC deficits, decreased sensation to light touch, and mild hypertonicity throughout the RUE.  Pt will continue to benefit from skilled OT to address above noted deficits in order to increase indep with ADLs and improve QOL.  PERFORMANCE DEFICITS: in functional skills including ADLs, IADLs, coordination, dexterity, sensation, edema, tone, ROM, strength, pain, flexibility, Fine motor control, Gross motor control, balance, body mechanics, decreased knowledge of use of DME, and UE functional use, and psychosocial skills including coping strategies, habits, and routines and behaviors.   IMPAIRMENTS: are limiting patient from ADLs, IADLs, work, leisure, and social participation.   CO-MORBIDITIES: may have co-morbidities  that affects occupational performance. Patient  will benefit from skilled OT to address above impairments and improve overall function.  MODIFICATION OR ASSISTANCE TO COMPLETE EVALUATION: No modification of tasks or assist necessary to complete an evaluation.  OT OCCUPATIONAL PROFILE AND HISTORY: Problem focused assessment: Including review of records relating to presenting problem.  CLINICAL DECISION MAKING: Moderate - several treatment options, min-mod task modification necessary  REHAB POTENTIAL: Good  EVALUATION COMPLEXITY: Moderate    PLAN:  OT FREQUENCY: 1-2x/week  OT DURATION: 12 weeks  PLANNED INTERVENTIONS: self care/ADL training, therapeutic exercise, therapeutic activity, neuromuscular re-education, manual therapy, passive range of motion, balance training, splinting, electrical stimulation, paraffin, moist heat, cryotherapy, contrast bath, patient/family education, coping strategies training, and DME and/or AE instructions  RECOMMENDED OTHER SERVICES: None at this time  CONSULTED AND AGREED WITH PLAN OF CARE: Patient  PLAN FOR NEXT SESSION: see above  Danelle Earthly, MS, OTR/L  Otis Dials, OT 04/07/2023, 12:40 PM

## 2023-04-08 ENCOUNTER — Ambulatory Visit: Payer: Medicaid Other | Admitting: Physical Therapy

## 2023-04-08 ENCOUNTER — Ambulatory Visit: Payer: Medicaid Other

## 2023-04-08 DIAGNOSIS — I63512 Cerebral infarction due to unspecified occlusion or stenosis of left middle cerebral artery: Secondary | ICD-10-CM

## 2023-04-08 DIAGNOSIS — R278 Other lack of coordination: Secondary | ICD-10-CM

## 2023-04-08 DIAGNOSIS — M6281 Muscle weakness (generalized): Secondary | ICD-10-CM | POA: Diagnosis not present

## 2023-04-09 ENCOUNTER — Telehealth: Payer: Self-pay | Admitting: Family Medicine

## 2023-04-09 ENCOUNTER — Ambulatory Visit: Payer: Medicaid Other | Admitting: Physical Therapy

## 2023-04-09 NOTE — Telephone Encounter (Signed)
PC to pt mom, discussed pt had neuro referral and needs to be seen by them prior,

## 2023-04-09 NOTE — Telephone Encounter (Signed)
Copied from CRM [id number]. Topic: General - Other >> Apr 09, 2023 12:24 PM Carrielelia G wrote: Due to his stroke ., patient is unable to work. His Mother is asking if a letter could be written so that the patient can obtain food stamps Please advise

## 2023-04-09 NOTE — Therapy (Signed)
OUTPATIENT OCCUPATIONAL THERAPY NEURO TREATMENT NOTE  Patient Name: Ricardo Yang MRN: [id number] DOB:1976/02/18, 47 y.o., male Today's Date: 04/09/2023  PCP: Dr. Joseph Berkshire REFERRING PROVIDER: Dr. Joseph Berkshire  END OF SESSION:  OT End of Session - 04/09/23 2046     Visit Number 19    Number of Visits 24    Date for OT Re-Evaluation 04/22/23    Authorization Time Period Reporting period beginning 03/06/23    Progress Note Due on Visit 10    OT Start Time 1015    OT Stop Time 1100    OT Time Calculation (min) 45 min    Equipment Utilized During Treatment none    Activity Tolerance Patient tolerated treatment well    Behavior During Therapy [facility] for tasks assessed/performed            Past Medical History:  Diagnosis Date   Acute CVA (cerebrovascular accident) (HCC) 10/04/2022   Acute ST elevation myocardial infarction (STEMI) involving left anterior descending (LAD) coronary artery (HCC) 08/06/2019   Anxiety 09/2022   After stroke   CAD (coronary artery disease) 10/04/2022   Cerebrovascular accident (CVA) (HCC) 10/07/2022   Chest pain    CHF (congestive heart failure) (HCC)    Cocaine abuse (HCC)    Cocaine use 10/06/2022   Depression December 2023   When stroke happened   Elevated troponin    Encounter for imaging to screen for metal prior to MRI 10/07/2022   Heavy smoker    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 question possible MS   NSTEMI (non-ST elevated myocardial infarction) (HCC) 05/25/2015   Obesity    Polysubstance abuse (HCC)    a. tobacco, cocaine, crack, ecstasy, pills, "anything but injections"   Primary hypertension 10/05/2022   Sleep apnea    Always   STEMI (ST elevation myocardial infarction) (HCC) 08/06/2019   Past Surgical History:  Procedure Laterality Date   CARDIAC CATHETERIZATION N/A 05/28/2015   Procedure: Left Heart Cath and Coronary Angiography;  Surgeon: Antonieta Iba, MD;   Location: [facility] INVASIVE CV LAB;  Service: Cardiovascular;  Laterality: N/A;   CORONARY ANGIOGRAPHY N/A 04/22/2021   Procedure: CORONARY ANGIOGRAPHY;  Surgeon: Marcina Millard, MD;  Location: [facility] INVASIVE CV LAB;  Service: Cardiovascular;  Laterality: N/A;   CORONARY/GRAFT ACUTE MI REVASCULARIZATION N/A 08/06/2019   Procedure: Coronary/Graft Acute MI Revascularization;  Surgeon: Marcina Millard, MD;  Location: [facility] INVASIVE CV LAB;  Service: Cardiovascular;  Laterality: N/A;   LEFT HEART CATH N/A 04/22/2021   Procedure: Left Heart Cath;  Surgeon: Marcina Millard, MD;  Location: [facility] INVASIVE CV LAB;  Service: Cardiovascular;  Laterality: N/A;   LEFT HEART CATH AND CORONARY ANGIOGRAPHY N/A 08/06/2019   Procedure: LEFT HEART CATH AND CORONARY ANGIOGRAPHY;  Surgeon: Marcina Millard, MD;  Location: [facility] INVASIVE CV LAB;  Service: Cardiovascular;  Laterality: N/A;   TEE WITHOUT CARDIOVERSION N/A 10/08/2022   Procedure: TRANSESOPHAGEAL ECHOCARDIOGRAM (TEE);  Surgeon: Debbe Odea, MD;  Location: [facility] ORS;  Service: Cardiovascular;  Laterality: N/A;  11:30am   Patient Active Problem List   Diagnosis Date Noted   Sleep apnea 01/01/2023   Dysfunction of right rotator cuff 01/01/2023   History of CVA with residual deficit 12/04/2022   Mixed hyperlipidemia 12/04/2022   History of MI (myocardial infarction) 12/04/2022   Insomnia 10/17/2022   Slow transit constipation 10/17/2022   Chronic pain of both knees 10/17/2022   History of substance abuse (HCC) 10/15/2022  HFrEF (heart failure with reduced ejection fraction) (HCC) 10/07/2022   Expressive aphasia 10/05/2022   Right hemiplegia (HCC) 10/05/2022   CAD S/P percutaneous coronary angioplasty 10/05/2022   Polysubstance abuse (HCC) 10/04/2022   Essential hypertension 04/22/2021   Ischemic chest pain [facility])    ONSET DATE: 10/04/22  REFERRING DIAG: I63.9 (ICD-10-CM) - CVA (cerebral vascular accident)   THERAPY DIAG:   Muscle weakness (generalized)  Other lack of coordination  Left middle cerebral artery stroke (HCC)  Rationale for Evaluation and Treatment: Rehabilitation  SUBJECTIVE:  SUBJECTIVE STATEMENT: Pt reports he used his R hand to turn a door knob yesterday, using the L hand to guide the R hand up to the knob, but then was able to grip and turn with the R hand. Accompanied by: self  PERTINENT HISTORY: CVA in 12/23. Multiple Cardiac caths over the last 5 years, hx of polysubstance abuse  PRECAUTIONS: None  WEIGHT BEARING RESTRICTIONS: No  PAIN:  Are you having pain? Yes: NPRS scale: 0-1/10 Pain location: RUE  Pain description: sharp and achy and burning Aggravating factors: end range stretch Relieving factors: heat, stretch, using the arm  FALLS: Has patient fallen in last 6 months? 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    PATIENT GOALS: "If I could use my hand 75%.  And to use my hand to use the bathroom."  Pt enjoyed playing cards and shooting pool, playing video games prior to CVA.  OBJECTIVE:  HAND DOMINANCE: Right  ADLs: Overall ADLs: performing ADLs predominantly with the L non-dominant hand Transfers/ambulation related to ADLs: without AD Eating: 1 handed cutting, L hand only Grooming: electric toothbrush, L hand only UB Dressing: assist with buttons and belt  LB Dressing: assist to tie shoes; performs all L handed Toileting: using L hand for toilet hygiene and clothing management Bathing: L hand only Tub Shower transfers: indep with tub shower transfer Equipment: none  IADLs: Shopping: able to  manage light shopping trips, but typically goes with a family member for the driving assistance Light housekeeping: L hand only  Meal Prep: cold meal prep, some hot meal prep when able to use L hand only Community mobility: Pt reports he has not regularly driven since CVA, but has driven a couple of times to the store.  Ambulatory without AD. Medication management: easy open pill bottles and pt uses a pill organizer and sets up independently  Financial management: pt reports he has no current bills Handwriting:  Holds a pen loosely with R hand and can make a mark on paper but not able to write his initials   MOBILITY STATUS:  ambulatory without AD; see PT note for details.  POSTURE COMMENTS:  R sided hemiparesis Sitting balance: Moves/returns truncal midpoint >2 inches in all planes  ACTIVITY TOLERANCE: Activity tolerance: WFL for assessment, to be further assessed in future visits  FUNCTIONAL OUTCOME MEASURES: FOTO: 37 ; predicted 43 03/06/23: 44.8  UPPER EXTREMITY ROM:    Active ROM Right eval Left  Eval WNL Right 03/06/23  Shoulder flexion 95  125  Shoulder abduction     Shoulder adduction 68  118  Shoulder extension     Shoulder internal rotation Thumb to posterior hip  Thumb to mid lower back  Shoulder external rotation Thumb to R ear with slight chin tuck and head tilt  Thumb to posterior R ear without chin tuck or head tilt  Elbow flexion     Elbow extension     Wrist flexion     Wrist extension     Wrist ulnar deviation     Wrist radial deviation     Wrist pronation     Wrist supination     (Blank rows = not tested)  UPPER EXTREMITY MMT:     MMT Left eval Right eval Right 03/06/23  Shoulder flexion 5 3- 3+ (within available range)  Shoulder abduction 5 3- 3+ (within available range)  Shoulder adduction     Shoulder extension     Shoulder internal rotation 5 3- 4- (elbow at side)  Shoulder external rotation 5 3- 3+ (elbow at side)  Middle trapezius      Lower trapezius     Elbow flexion 5 3+ 4  Elbow extension 5 3+ 4  Wrist flexion 5 4- 4  Wrist extension 5 4 4+  Wrist ulnar deviation     Wrist radial deviation     Wrist pronation 5 3+ 4  Wrist supination 5 3- 3+  (Blank rows = not tested)  HAND FUNCTION: Grip strength: Right: (3rd level on dynamometer) 4 lbs; Left: 108 lbs, Lateral pinch: Right: 2 lbs, Left: 22 lbs, and 3 point pinch: Right: 0 lbs, Left: 21 lbs (Saehan pinch gauge) 03/06/23: Grip strength: Right: (3rd level on dynamometer) 8 lbs; Left: 105 lbs, Lateral pinch: Right: 6 lbs, Left: 27 lbs, and 3 point pinch: Right: 4 lbs, Left: 17 lbs  COORDINATION: Finger Nose Finger test: RUE with difficulty 9 Hole Peg test: Right: unable sec; Left: 31.5 sec 03/06/23: R: unable; was able to remove a peg to place back into dish  SENSATION: Light touch: Impaired  pins and needles down the arm   EDEMA: R hand moderately edematous: 24 cm circumferentially at the MCPs of digits 2-5 (L 22.5 cm) 03/06/23: 23.3 cm  MUSCLE TONE: RUE: Mild and Hypertonic  COGNITION: Overall cognitive status: Within functional limits for tasks assessed  VISION: Baseline vision: No visual deficits  VISION ASSESSMENT: WFL  PERCEPTION: WFL  PRAXIS: Impaired: Motor planning  TODAY'S TREATMENT:                                                                                                                              DATE: 04/07/23 Moist heat applied to R shoulder for pain reduction/muscle relaxation during completion of therapeutic exercises and neuro re-ed activities noted below.    Therapeutic Exercise: In sitting performed reps of passive stretching for R shoulder ER/IR, shoulder flex, abd, R forearm supination, and R wrist and digit extension in prep for engaging the RUE into neuro re-ed activities noted below.  Neuro re-ed: Facilitated R hand [facility] skills working to remove ball pegs from pegboard using a 2 point and 3 point pinch.  Vc and demo  for prehension patterns.  Min vc for intermittent passive wrist and digit extension stretching and WB through the hand at table top for tone reduction.  Facilitated R forearm, wrist, and hand strengthening with participation in EZ board tools.  Pt worked with long handled tool to facilitate R wrist flex/ext and forearm pron/sup, large base key turn, and large dial turn, requiring min A to maintain tools level on board.  Min-mod A to maximize ROM arc with each tool rotation and to maintain prehension on tools.  Short rest breaks between use of each tool.  PATIENT EDUCATION: Education details: FMC activities Person educated: Patient Education method: Programmer, multimedia, demo Education comprehension: verbalized understanding, further training needed  HOME EXERCISE PROGRAM: Theraputty exercises, cane stretches, digit AROM/AAROM/PROM/place and hold exercises for digit extension; lateral and 3 point pinching to grasp small/light items from table top.  GOALS: Goals reviewed with patient? Yes  SHORT TERM GOALS: Target date: 03/11/23 (6 weeks)  Pt will be indep to perform HEP for improving RUE strength and coordination. Baseline: Eval: not yet initiated; 03/06/23: indep and consistent with current program; will continue to progress as necessary Goal status: achieved  LONG TERM GOALS: Target date: 04/22/23 (12 weeks)  Pt will increase FOTO score to 43 or better to indicate improvement in self perceived functional use of the R arm with daily tasks. Baseline: Eval: 37; 03/06/23: 44.8 Goal status: achieved/ongoing  2.  Pt will increase R grip strength by 15 or more lbs in order to improve ability to hold and carry light ADL supplies in R dominant hand.    Baseline: Eval: R grip 4 lbs, L 108 lbs (3rd slot on dynamometer); 03/06/23: R grip 8 lbs Goal status: ongoing  3.  Pt will increase R lateral pinch strength to be able to manipulate playing cards with minimal dropping. Baseline: Eval: R 2 lbs (L 22 lbs); pt  has not attempted playing cards since CVA; 03/06/23: R lateral pinch 6 lbs Goal status: ongoing  4.  Pt will improve FMC/dexterity skills in R hand to be able to engage the R hand to assist the L with clothing fasteners. Baseline: Eval: R 9 hole (unable), L 31.5 sec; manages clothing fasteners with L hand only; 03/06/23: R 9 hole: unable; R hand can grossly grasp clothing to help with stabilizing while L hand manages fasteners. Goal status: ongoing  5.  Pt will increase RUE strength and coordination to be able to use the RUE as a good assist to the L during ADL/IADL tasks. Baseline: Eval: manages all ADLs with the L non-dominant hand; 03/06/23: Pt is using R hand as a stabilizer at least 75% of the time with ADLs; all strength measures improving throughout RUE (see chart above) and pt uses R hand as a poor-fair assist. Goal status: achieved/revised/ongoing  6.  Pt will sign name for documents with R dominant hand, adapted pen as needed, with 100% legibility and demonstrate ability to isolate wrist and finger movements from forearm and upper arm while writing. Baseline: Eval: Pt holds standard pen and can make marks on paper but not yet formulate letters of his name; 03/06/23: pt can print name with 90% legibility using a built up foam grip on a standard pen (pt does yet not isolate finger and wrist movements from the forearm and upper arm when writing) Goal status: revised/ongoing  ASSESSMENT: CLINICAL IMPRESSION: Pt reports he used his R hand to turn a door knob yesterday, using the L hand to guide the R hand up to the knob, but then was able to grip and turn with the R hand.  Pt continues to focus on increasing wrist and digit extensor strength for minimizing flexor patterns in the hand.  Pt was able to manage removing ball pegs today using a light grasp pattern with tip of R IF and thumb, but flexor synergy occurs with attempts at use of 3 point pinch.  Pt intermittently required vc to perform  passive digit extension and WB through the hand on table top in between every few reps for minimizing flexor tone in the hand.  Min-mod A with combination movements to rotate EZ board tools on wide strip of velcro resistance.  Pt continues to present with RUE weakness, still mild edema in the R hand, GMC/FMC deficits, decreased sensation to light touch, and mild hypertonicity throughout the RUE.  Pt will continue to benefit from skilled OT to address above noted deficits in order to increase indep with ADLs and improve QOL.  PERFORMANCE DEFICITS: in functional skills including ADLs, IADLs, coordination, dexterity, sensation, edema, tone, ROM, strength, pain, flexibility, Fine motor control, Gross motor control, balance, body mechanics, decreased knowledge of use of DME, and UE functional use, and psychosocial skills including coping strategies, habits, and routines and behaviors.   IMPAIRMENTS: are limiting patient from ADLs, IADLs, work, leisure, and social participation.   CO-MORBIDITIES: may have co-morbidities  that affects occupational performance. Patient will benefit from skilled OT to address above impairments and improve overall function.  MODIFICATION OR ASSISTANCE TO COMPLETE EVALUATION: No modification of tasks or assist necessary to complete an evaluation.  OT OCCUPATIONAL PROFILE AND HISTORY: Problem focused assessment: Including review of records relating to presenting problem.  CLINICAL DECISION MAKING: Moderate - several treatment options, min-mod task modification necessary  REHAB POTENTIAL: Good  EVALUATION COMPLEXITY: Moderate    PLAN:  OT FREQUENCY: 1-2x/week  OT DURATION: 12 weeks  PLANNED INTERVENTIONS: self care/ADL training, therapeutic exercise, therapeutic activity, neuromuscular re-education, manual therapy, passive range of motion, balance training, splinting, electrical stimulation, paraffin, moist heat, cryotherapy, contrast bath, patient/family education,  coping strategies training, and DME and/or AE instructions  RECOMMENDED OTHER SERVICES: None at this time  CONSULTED AND AGREED WITH PLAN OF CARE: Patient  PLAN FOR NEXT SESSION: see above  Danelle Earthly, MS, OTR/L  Otis Dials, OT 04/09/2023, 8:48 PM

## 2023-04-13 ENCOUNTER — Ambulatory Visit: Payer: Medicaid Other

## 2023-04-14 ENCOUNTER — Ambulatory Visit: Payer: Medicaid Other | Admitting: Physical Therapy

## 2023-04-15 ENCOUNTER — Ambulatory Visit: Payer: Medicaid Other | Admitting: Physical Therapy

## 2023-04-15 ENCOUNTER — Ambulatory Visit: Payer: Medicaid Other

## 2023-04-15 DIAGNOSIS — R278 Other lack of coordination: Secondary | ICD-10-CM

## 2023-04-15 DIAGNOSIS — M6281 Muscle weakness (generalized): Secondary | ICD-10-CM | POA: Diagnosis not present

## 2023-04-15 DIAGNOSIS — I63512 Cerebral infarction due to unspecified occlusion or stenosis of left middle cerebral artery: Secondary | ICD-10-CM

## 2023-04-15 NOTE — Therapy (Signed)
OUTPATIENT OCCUPATIONAL THERAPY NEURO PROGRESS AND RECERTIFICATION NOTE Reporting period beginning 03/06/23-04/15/23  Patient Name: Ricardo Yang MRN: [id number] DOB:06-13-1976, 47 y.o., male Today's Date: 04/17/2023  PCP: Dr. Joseph Berkshire REFERRING PROVIDER: Dr. Joseph Berkshire  END OF SESSION:  OT End of Session - 04/17/23 0815     Visit Number 20    Number of Visits 24    Date for OT Re-Evaluation 05/27/23    Authorization Type 27 max per calendar year shared with OT/PT    Authorization Time Period Reporting period beginning 03/06/23-04/15/23    Authorization - Visit Number 25    Authorization - Number of Visits 27    Progress Note Due on Visit 10    OT Start Time 1450    OT Stop Time 1550    OT Time Calculation (min) 60 min    Equipment Utilized During Treatment none    Activity Tolerance Patient tolerated treatment well    Behavior During Therapy [facility] for tasks assessed/performed            Past Medical History:  Diagnosis Date   Acute CVA (cerebrovascular accident) (HCC) 10/04/2022   Acute ST elevation myocardial infarction (STEMI) involving left anterior descending (LAD) coronary artery (HCC) 08/06/2019   Anxiety 09/2022   After stroke   CAD (coronary artery disease) 10/04/2022   Cerebrovascular accident (CVA) (HCC) 10/07/2022   Chest pain    CHF (congestive heart failure) (HCC)    Cocaine abuse (HCC)    Cocaine use 10/06/2022   Depression December 2023   When stroke happened   Elevated troponin    Encounter for imaging to screen for metal prior to MRI 10/07/2022   Heavy smoker    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 question possible MS   NSTEMI (non-ST elevated myocardial infarction) (HCC) 05/25/2015   Obesity    Polysubstance abuse (HCC)    a. tobacco, cocaine, crack, ecstasy, pills, "anything but injections"   Primary hypertension 10/05/2022   Sleep apnea    Always   STEMI (ST elevation myocardial  infarction) (HCC) 08/06/2019   Past Surgical History:  Procedure Laterality Date   CARDIAC CATHETERIZATION N/A 05/28/2015   Procedure: Left Heart Cath and Coronary Angiography;  Surgeon: Antonieta Iba, MD;  Location: [facility] INVASIVE CV [facility];  Service: Cardiovascular;  Laterality: N/A;   CORONARY ANGIOGRAPHY N/A 04/22/2021   Procedure: CORONARY ANGIOGRAPHY;  Surgeon: Marcina Millard, MD;  Location: [facility] INVASIVE CV LAB;  Service: Cardiovascular;  Laterality: N/A;   CORONARY/GRAFT ACUTE MI REVASCULARIZATION N/A 08/06/2019   Procedure: Coronary/Graft Acute MI Revascularization;  Surgeon: Marcina Millard, MD;  Location: [facility] INV[facility]IVE CV [facility];  Service: Cardiovascular;  Laterality: N/A;   LEFT HEART CATH N/A 04/22/2021   Procedure: Left Heart Cath;  Surgeon: Marcina Millard, MD;  Location: [facility] INVASIVE CV LAB;  Service: Cardiovascular;  Laterality: N/A;   LEFT HEART CATH AND CORONARY ANGIOGRAPHY N/A 08/06/2019   Procedure: LEFT HEART CATH AND CORONARY ANGIOGRAPHY;  Surgeon: Marcina Millard, MD;  Location: [facility] INVASIVE CV LAB;  Service: Cardiovascular;  Laterality: N/A;   TEE WITHOUT CARDIOVERSION N/A 10/08/2022   Procedure: TRANSESOPHAGEAL ECHOCARDIOGRAM (TEE);  Surgeon: Debbe Odea, MD;  Location: [facility] ORS;  Service: Cardiovascular;  Laterality: N/A;  11:30am   Patient Active Problem List   Diagnosis Date Noted   Sleep apnea 01/01/2023   Dysfunction of right rotator cuff 01/01/2023   History of CVA with residual deficit 12/04/2022   Mixed hyperlipidemia  12/04/2022   History of MI (myocardial infarction) 12/04/2022   Insomnia 10/17/2022   Slow transit constipation 10/17/2022   Chronic pain of both knees 10/17/2022   History of substance abuse (HCC) 10/15/2022   HFrEF (heart failure with reduced ejection fraction) (HCC) 10/07/2022   Expressive aphasia 10/05/2022   Right hemiplegia (HCC) 10/05/2022   CAD S/P percutaneous coronary angioplasty 10/05/2022    Polysubstance abuse (HCC) 10/04/2022   Essential hypertension 04/22/2021   Ischemic chest pain [facility])    ONSET DATE: 10/04/22  REFERRING DIAG: I63.9 (ICD-10-CM) - CVA (cerebral vascular accident)   THERAPY DIAG:  Muscle weakness (generalized)  Other lack of coordination  Left middle cerebral artery stroke (HCC)  Rationale for Evaluation and Treatment: Rehabilitation  SUBJECTIVE:  SUBJECTIVE STATEMENT: Pt reports that he's feeling more stiff today and not using his hand as well. Accompanied by: self  PERTINENT HISTORY: CVA in 12/23. Multiple Cardiac caths over the last 5 years, hx of polysubstance abuse  PRECAUTIONS: None  WEIGHT BEARING RESTRICTIONS: No  PAIN:  Are you having pain? Yes: NPRS scale: 2-3/10 Pain location: R hand Pain description: dull, achy Aggravating factors: over use Relieving factors: heat, stretch  FALLS: Has patient fallen in last 6 months? 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    PATIENT GOALS: "If I could use my hand 75%.  And to use my hand to use the bathroom."  Pt enjoyed playing cards and shooting pool, playing video games prior to CVA.  OBJECTIVE:  HAND DOMINANCE: Right  ADLs: Overall ADLs: performing ADLs predominantly with the L non-dominant hand Transfers/ambulation related to ADLs: without AD Eating: 1 handed cutting, L hand only Grooming: electric toothbrush, L hand only UB Dressing: assist with buttons and belt  LB Dressing: assist to tie shoes; performs all L handed Toileting: using L hand for toilet hygiene and clothing management Bathing: L hand only Tub  Shower transfers: indep with tub shower transfer Equipment: none  IADLs: Shopping: able to manage light shopping trips, but typically goes with a family member for the driving assistance Light housekeeping: L hand only  Meal Prep: cold meal prep, some hot meal prep when able to use L hand only Community mobility: Pt reports he has not regularly driven since CVA, but has driven a couple of times to the store.  Ambulatory without AD. Medication management: easy open pill bottles and pt uses a pill organizer and sets up independently  Financial management: pt reports he has no current bills Handwriting:  Holds a pen loosely with R hand and can make a mark on paper but not able to write his initials   MOBILITY STATUS:  ambulatory without AD; see PT note for details.  POSTURE COMMENTS:  R sided hemiparesis Sitting balance: Moves/returns truncal midpoint >2 inches in all planes  ACTIVITY TOLERANCE: Activity tolerance: WFL for assessment, to be further assessed in future visits  FUNCTIONAL OUTCOME MEASURES: FOTO: 37 ; predicted 43 03/06/23: 44.8 6/19: 51  UPPER EXTREMITY ROM:    Active ROM Right eval Left  Eval WNL Right 03/06/23 Right 04/15/23  Shoulder flexion 95  125  140  Shoulder abduction      Shoulder adduction 68  118 123  Shoulder extension      Shoulder internal rotation Thumb to posterior hip  Thumb to mid lower back Thumb to mid lower back  Shoulder external rotation Thumb to R ear with slight chin tuck and head tilt  Thumb to posterior R ear without chin tuck or head tilt Able to touch behind head with slight chin tuck  Elbow flexion      Elbow extension      Wrist flexion      Wrist extension      Wrist ulnar deviation      Wrist radial deviation      Wrist pronation      Wrist supination      (Blank rows = not tested)  UPPER EXTREMITY MMT:     MMT Left eval Right eval Right 03/06/23 Right 04/15/23  Shoulder flexion 5 3- 3+ (within available range) 4-   Shoulder abduction 5 3- 3+ (within available range) 4-  Shoulder adduction      Shoulder extension      Shoulder internal rotation 5 3- 4- (elbow at side) 4-  Shoulder external rotation 5 3- 3+ (elbow at side) 3+  Middle trapezius      Lower trapezius      Elbow flexion 5 3+ 4 4  Elbow extension 5 3+ 4 4+  Wrist flexion 5 4- 4 4+  Wrist extension 5 4 4+ 4+  Wrist ulnar deviation      Wrist radial deviation      Wrist pronation 5 3+ 4 4+  Wrist supination 5 3- 3+ 4-  (Blank rows = not tested)  HAND FUNCTION: Grip strength: Right: (3rd level on dynamometer) 4 lbs; Left: 108 lbs, Lateral pinch: Right: 2 lbs, Left: 22 lbs, and 3 point pinch: Right: 0 lbs, Left: 21 lbs (Saehan pinch gauge) 03/06/23: Grip strength: Right: (3rd level on dynamometer) 8 lbs; Left: 105 lbs, Lateral pinch: Right: 6 lbs, Left: 27 lbs, and 3 point pinch: Right: 4 lbs, Left: 17 lbs 04/15/23: Grip strength: Right: (3rd level on dynamometer) 11 lbs; Lateral pinch: Right: 4 lbs, and 3 point pinch: Right: unable to maintain fingers on pinch gauge today  COORDINATION: Finger Nose Finger test: RUE with difficulty 9 Hole Peg test: Right: unable sec; Left: 31.5 sec 03/06/23: R: unable; was able to remove a peg to place back into dish 04/15/23: R: unable  SENSATION: Light touch: Impaired  pins and needles down the arm   EDEMA: R hand moderately edematous: 24 cm circumferentially at the MCPs of digits 2-5 (L 22.5 cm) 03/06/23: 23.3 cm  MUSCLE TONE: RUE: Mild and Hypertonic  COGNITION: Overall cognitive status: Within functional limits for tasks assessed  VISION: Baseline vision: No visual deficits  VISION ASSESSMENT: WFL  PERCEPTION: WFL  PRAXIS: Impaired: Motor planning  TODAY'S TREATMENT:  DATE: 04/15/23 Moist heat applied to R shoulder for pain reduction/muscle relaxation during  completion of therapeutic exercises and neuro re-ed activities noted below.    Therapeutic Exercise: Objective measures taken and goals updated for progress note/recertification.  In sitting performed reps of passive stretching for R shoulder ER/IR, shoulder flex, abd, R forearm supination, and R wrist and digit extension in prep for engaging the RUE into neuro re-ed activities noted below.    Neuro re-ed: Facilitated R hand [facility] skills working to remove jumbo pegs from pegboard using a 2 point pinch.  Vc and demo for prehension patterns.  Min vc for intermittent passive wrist and digit extension stretching and WB through the hand at table top for tone reduction.  OT provided intermittent hand over hand assist to grasp pegs with a 3 point pinch, but pt unable to sustain this position independently.  Facilitated active digit extension with reps of rolling peg on table top from closed fist to active digit extension.    PATIENT EDUCATION: Education details: Progress towards goals, HEP review Person educated: Patient Education method: Explanation Education comprehension: verbalized understanding  HOME EXERCISE PROGRAM: Theraputty exercises, cane stretches, digit AROM/AAROM/PROM/place and hold exercises for digit extension; lateral and 3 point pinching to grasp small/light items from table top.  GOALS: Goals reviewed with patient? Yes  SHORT TERM GOALS: Target date: 03/11/23 (6 weeks)  Pt will be indep to perform HEP for improving RUE strength and coordination. Baseline: Eval: not yet initiated; 03/06/23: indep and consistent with current program; will continue to progress as necessary Goal status: achieved  LONG TERM GOALS: Target date: 05/27/23   Pt will increase FOTO score to 43 or better to indicate improvement in self perceived functional use of the R arm with daily tasks. Baseline: Eval: 37; 03/06/23: 44.8; 04/15/23: 51 Goal status: achieved/ongoing  2.  Pt will increase R grip strength by  15 or more lbs in order to improve ability to hold and carry light ADL supplies in R dominant hand.    Baseline: Eval: R grip 4 lbs, L 108 lbs (3rd slot on dynamometer); 03/06/23: R grip 8 lbs; 04/15/23: 11 lbs (3rd slot) Goal status: ongoing  3.  Pt will increase R lateral pinch strength to be able to manipulate playing cards with minimal dropping. Baseline: Eval: R 2 lbs (L 22 lbs); pt has not attempted playing cards since CVA; 03/06/23: R lateral pinch 6 lbs; 04/15/23: R lateral pinch 4 lbs Goal status: ongoing  4.  Pt will improve FMC/dexterity skills in R hand to be able to engage the R hand to assist the L with clothing fasteners. Baseline: Eval: R 9 hole (unable), L 31.5 sec; manages clothing fasteners with L hand only; 03/06/23: R 9 hole: unable; R hand can grossly grasp clothing to help with stabilizing while L hand manages fasteners; 04/15/23: same as 03/06/23 Goal status: ongoing  5.  Pt will increase RUE strength and coordination to be able to use the RUE as a good assist to the L during ADL/IADL tasks. Baseline: Eval: manages all ADLs with the L non-dominant hand; 03/06/23: Pt is using R hand as a stabilizer at least 75% of the time with ADLs; all strength measures improving throughout RUE (see chart above) and pt uses R hand as a poor-fair assist; 04/15/23: R hand used as a fair assist Goal status: achieved/revised/ongoing  6.  Pt will sign name for documents with R dominant hand, adapted pen as needed, with 100% legibility and demonstrate ability to  isolate wrist and finger movements from forearm and upper arm while writing. Baseline: Eval: Pt holds standard pen and can make marks on paper but not yet formulate letters of his name; 03/06/23: pt can print name with 90% legibility using a built up foam grip on a standard pen (pt does yet not isolate finger and wrist movements from the forearm and upper arm when writing); 04/15/23: same as 03/06/23 Goal status:  revised/ongoing  ASSESSMENT: CLINICAL IMPRESSION: Pt seen for 20th visit progress update and recertification visit.  Pt continues to make steady progress towards goals, including improved ROM and strength measures throughout the R shoulder, elbow, and wrist.  R grip strength has improved but pinch measures with no significant changes, noting limitations with fine prehension patterns d/t spasticity.  FOTO score has increased from 37 at eval to 51 at recertification.  Pt has 2 remaining visits which will continue to focus on HEP progression and reviewing strategies to normalize tone.  Pt would benefit from Vivistim referral/evaluation and home Estim unit.  Plan to recommend Saebo unit for easy self application.  OT has encouraged pt that continued functional gains are anticipated as pt remains consistent with his participation in his HEP and has remained diligent in his attempts at engaging the R hand into all daily tasks.    PERFORMANCE DEFICITS: in functional skills including ADLs, IADLs, coordination, dexterity, sensation, edema, tone, ROM, strength, pain, flexibility, Fine motor control, Gross motor control, balance, body mechanics, decreased knowledge of use of DME, and UE functional use, and psychosocial skills including coping strategies, habits, and routines and behaviors.   IMPAIRMENTS: are limiting patient from ADLs, IADLs, work, leisure, and social participation.   CO-MORBIDITIES: may have co-morbidities  that affects occupational performance. Patient will benefit from skilled OT to address above impairments and improve overall function.  MODIFICATION OR ASSISTANCE TO COMPLETE EVALUATION: No modification of tasks or assist necessary to complete an evaluation.  OT OCCUPATIONAL PROFILE AND HISTORY: Problem focused assessment: Including review of records relating to presenting problem.  CLINICAL DECISION MAKING: Moderate - several treatment options, min-mod task modification necessary  REHAB  POTENTIAL: Good  EVALUATION COMPLEXITY: Moderate    PLAN:  OT FREQUENCY: 1-2x/week  OT DURATION: 12 weeks  PLANNED INTERVENTIONS: self care/ADL training, therapeutic exercise, therapeutic activity, neuromuscular re-education, manual therapy, passive range of motion, balance training, splinting, electrical stimulation, paraffin, moist heat, cryotherapy, contrast bath, patient/family education, coping strategies training, and DME and/or AE instructions  RECOMMENDED OTHER SERVICES: None at this time  CONSULTED AND AGREED WITH PLAN OF CARE: Patient  PLAN FOR NEXT SESSION: see above  Danelle Earthly, MS, OTR/L  Otis Dials, OT 04/17/2023, 8:23 AM

## 2023-04-16 ENCOUNTER — Ambulatory Visit: Payer: Medicaid Other | Admitting: Physical Therapy

## 2023-04-20 ENCOUNTER — Other Ambulatory Visit: Payer: Self-pay

## 2023-04-20 ENCOUNTER — Ambulatory Visit: Payer: Medicaid Other | Admitting: Physical Therapy

## 2023-04-20 ENCOUNTER — Ambulatory Visit: Payer: Medicaid Other

## 2023-04-20 ENCOUNTER — Encounter: Payer: Self-pay | Admitting: Family

## 2023-04-20 ENCOUNTER — Ambulatory Visit: Payer: Medicaid Other | Attending: Family | Admitting: Family

## 2023-04-20 VITALS — BP 130/87 | HR 77 | Wt 278.6 lb

## 2023-04-20 DIAGNOSIS — G4730 Sleep apnea, unspecified: Secondary | ICD-10-CM | POA: Insufficient documentation

## 2023-04-20 DIAGNOSIS — I110 Hypertensive heart disease with heart failure: Secondary | ICD-10-CM | POA: Diagnosis not present

## 2023-04-20 DIAGNOSIS — Z7982 Long term (current) use of aspirin: Secondary | ICD-10-CM | POA: Diagnosis not present

## 2023-04-20 DIAGNOSIS — F17210 Nicotine dependence, cigarettes, uncomplicated: Secondary | ICD-10-CM | POA: Insufficient documentation

## 2023-04-20 DIAGNOSIS — I5022 Chronic systolic (congestive) heart failure: Secondary | ICD-10-CM | POA: Insufficient documentation

## 2023-04-20 DIAGNOSIS — I2510 Atherosclerotic heart disease of native coronary artery without angina pectoris: Secondary | ICD-10-CM | POA: Insufficient documentation

## 2023-04-20 DIAGNOSIS — Z8249 Family history of ischemic heart disease and other diseases of the circulatory system: Secondary | ICD-10-CM | POA: Insufficient documentation

## 2023-04-20 DIAGNOSIS — I10 Essential (primary) hypertension: Secondary | ICD-10-CM

## 2023-04-20 DIAGNOSIS — I6930 Unspecified sequelae of cerebral infarction: Secondary | ICD-10-CM | POA: Diagnosis not present

## 2023-04-20 DIAGNOSIS — Z79899 Other long term (current) drug therapy: Secondary | ICD-10-CM | POA: Insufficient documentation

## 2023-04-20 DIAGNOSIS — I255 Ischemic cardiomyopathy: Secondary | ICD-10-CM

## 2023-04-20 DIAGNOSIS — Z8673 Personal history of transient ischemic attack (TIA), and cerebral infarction without residual deficits: Secondary | ICD-10-CM | POA: Diagnosis not present

## 2023-04-20 DIAGNOSIS — Z9861 Coronary angioplasty status: Secondary | ICD-10-CM

## 2023-04-20 DIAGNOSIS — I252 Old myocardial infarction: Secondary | ICD-10-CM | POA: Diagnosis not present

## 2023-04-20 DIAGNOSIS — F1911 Other psychoactive substance abuse, in remission: Secondary | ICD-10-CM

## 2023-04-20 DIAGNOSIS — Z823 Family history of stroke: Secondary | ICD-10-CM | POA: Insufficient documentation

## 2023-04-20 DIAGNOSIS — G35 Multiple sclerosis: Secondary | ICD-10-CM | POA: Diagnosis not present

## 2023-04-20 DIAGNOSIS — Z7902 Long term (current) use of antithrombotics/antiplatelets: Secondary | ICD-10-CM | POA: Insufficient documentation

## 2023-04-20 MED ORDER — VALSARTAN 40 MG PO TABS
40.0000 mg | ORAL_TABLET | Freq: Every day | ORAL | 5 refills | Status: DC
  Filled 2023-04-20: qty 30, 30d supply, fill #0
  Filled 2023-06-25: qty 30, 30d supply, fill #1
  Filled 2023-08-03 – 2023-10-07 (×2): qty 30, 30d supply, fill #2
  Filled 2023-11-03 (×2): qty 30, 30d supply, fill #3
  Filled 2024-03-04: qty 30, 30d supply, fill #4

## 2023-04-20 NOTE — Progress Notes (Signed)
0PCP: Joseph Berkshire, MD (last seen 05/24) Primary Cardiologist: Debbe Odea, MD (last seen 04/24)  HPI:  Ricardo Yang is a 47 y/o male with a history of CAD, HTN, stroke, anxiety, depression, multiple sclerosis, sleep apnea, tobacco use, polysubstance use and chronic heart failure.   TEE 10/08/22: EF 20-25%, no atrial thrombus, mild Ricardo. Echo 10/05/22: EF <20%, mild LAE/RAE and mild Ricardo  LHC 06/22: Mid RCA lesion is 50% stenosed. 2nd Mrg lesion is 100% stenosed. Ost LAD to Prox LAD lesion is 20% stenosed.  1.  Non-ST elevation myocardial infarction 2.  Patent stent proximal LAD 3.  Distal occlusion small to moderate caliber OM 2 4.  Mild to moderate reduced left ventricular function with apical dyskinesis  Was in the ED 11/27/22 due to right hand swelling. Ultrasound negative for DVT. Admitted 10/04/22 due to  acute onset of aphasia and right-sided weakness with leftward gaze. MRI showed acute infarct in the left ACA, left MCA and likely of the right PCA territory with a large infarct involving the left postcentral gyrus. Transferred to rehab.   He presents today for a HF f/u visit with a chief complaint of minimal fatigue with moderate exertion. Chronic in nature. Has associated bilateral knee pain and right arm swelling along with this. Reports sleeping ok with muscle relaxant. Denies chest pain, palpitations, dizziness, pedal edema or abdominal distention. Has an increase in weight but says that it's because he's eating "too good". Not getting much exercise due to bilateral knee pain.   Had 1 extraction and deep dental cleaning done earlier today and mouth continues to be a little numb. Has not taken any of his medications yet today.    Smokes 3 cigarettes weekly along with marijuana a couple of times/ week. Denies cocaine.   ROS: All systems negative except as listed in HPI, PMH and Problem List.  SH:  Social History   Socioeconomic History   Marital status: Single    Spouse  name: Not on file   Number of children: Not on file   Years of education: Not on file   Highest education level: Not on file  Occupational History   Not on file  Tobacco Use   Smoking status: Every Day    Packs/day: 0.25    Years: 15.00    Additional pack years: 0.00    Total pack years: 3.75    Types: Cigarettes   Smokeless tobacco: Never   Tobacco comments:    Started as a teenager  Vaping Use   Vaping Use: Never used  Substance and Sexual Activity   Alcohol use: Not Currently    Alcohol/week: 1.0 standard drink of alcohol    Types: 1 Shots of liquor per week   Drug use: Yes    Frequency: 1.0 times per week    Types: Cocaine, Marijuana    Comment: 01/26/23 current marijuana no cocaine   Sexual activity: Not Currently    Partners: Female    Birth control/protection: Abstinence, Condom  Other Topics Concern   Not on file  Social History Narrative   Not on file   Social Determinants of Health   Financial Resource Strain: Not on file  Food Insecurity: No Food Insecurity (01/01/2023)   Hunger Vital Sign    Worried About Running Out of Food in the Last Year: Never true    Ran Out of Food in the Last Year: Never true  Transportation Needs: No Transportation Needs (01/01/2023)   PRAPARE - Transportation  Lack of Transportation (Medical): No    Lack of Transportation (Non-Medical): No  Physical Activity: Not on file  Stress: Not on file  Social Connections: Not on file  Intimate Partner Violence: Not At Risk (01/01/2023)   Humiliation, Afraid, Rape, and Kick questionnaire    Fear of Current or Ex-Partner: No    Emotionally Abused: No    Physically Abused: No    Sexually Abused: No    FH:  Family History  Problem Relation Age of Onset   Miscarriages / Stillbirths Mother    Heart disease Father    Hypertension Father    Depression Daughter    Alcohol abuse Maternal Grandfather    Cancer Maternal Grandfather    Depression Maternal Grandfather    Hypertension Maternal  Grandfather    Stroke Maternal Grandfather    Cancer Maternal Grandmother    Hypertension Maternal Grandmother     Past Medical History:  Diagnosis Date   Acute CVA (cerebrovascular accident) (HCC) 10/04/2022   Acute ST elevation myocardial infarction (STEMI) involving left anterior descending (LAD) coronary artery (HCC) 08/06/2019   Anxiety 09/2022   After stroke   CAD (coronary artery disease) 10/04/2022   Cerebrovascular accident (CVA) (HCC) 10/07/2022   Chest pain    CHF (congestive heart failure) (HCC)    Cocaine abuse (HCC)    Cocaine use 10/06/2022   Depression December 2023   When stroke happened   Elevated troponin    Encounter for imaging to screen for metal prior to MRI 10/07/2022   Heavy smoker    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 question possible MS   NSTEMI (non-ST elevated myocardial infarction) (HCC) 05/25/2015   Obesity    Polysubstance abuse (HCC)    a. tobacco, cocaine, crack, ecstasy, pills, "anything but injections"   Primary hypertension 10/05/2022   Sleep apnea    Always   STEMI (ST elevation myocardial infarction) (HCC) 08/06/2019    Current Outpatient Medications  Medication Sig Dispense Refill   aspirin 81 MG chewable tablet Chew 1 tablet (81 mg total) by mouth daily. 30 tablet 5   atorvastatin (LIPITOR) 80 MG tablet Take 1 tablet (80 mg total) by mouth daily at 6 PM. 30 tablet 5   baclofen (LIORESAL) 10 MG tablet Take 10 mg by mouth at bedtime as needed for muscle spasms.     clopidogrel (PLAVIX) 75 MG tablet Take 1 tablet (75 mg total) by mouth daily with breakfast. 30 tablet 5   dapagliflozin propanediol (FARXIGA) 10 MG TABS tablet Take 1 tablet (10 mg total) by mouth daily. 30 tablet 5   lisinopril (ZESTRIL) 2.5 MG tablet Take 1 tablet (2.5 mg total) by mouth daily. 30 tablet 5   metoprolol succinate (TOPROL-XL) 25 MG 24 hr tablet Take 1 tablet (25 mg total) by mouth daily. Dose change 30  tablet 5   No current facility-administered medications for this visit.   Vitals:   04/20/23 1353  BP: 130/87  Pulse: 77  SpO2: 99%  Weight: 278 lb 9.6 oz (126.4 kg)   Wt Readings from Last 3 Encounters:  04/20/23 278 lb 9.6 oz (126.4 kg)  03/12/23 271 lb 6.4 oz (123.1 kg)  03/03/23 270 lb (122.5 kg)   Lab Results  Component Value Date   CREATININE 0.97 10/20/2022   CREATININE 0.91 10/13/2022   CREATININE 1.10 10/10/2022   PHYSICAL EXAM:  General:  Well appearing. No resp difficulty HEENT: normal Neck: supple.  JVP flat. No lymphadenopathy or thryomegaly appreciated. Cor: PMI normal. Regular rate & rhythm. No rubs, gallops or murmurs. Lungs: clear Abdomen: soft, nontender, nondistended. No hepatosplenomegaly. No bruits or masses.  Extremities: no cyanosis, clubbing, rash. Right arm has swelling noted Neuro: alert & oriented x3, cranial nerves grossly intact. Weakness noted in right arm. Affect pleasant.  ECG: not done  ASSESSMENT & PLAN:  1: Ischemic heart failure with reduced ejection fraction- - NYHA class II - euvolemic - weighing daily; reminded to call for an overnight weight gain of > 2 pounds or a weekly weight gain of > 5 pounds; rationale for this was explained - weight up 7 pounds from last visit here 1 month ago; says that he's eating "too good" - TEE 10/08/22: EF 20-25%, no atrial thrombus, mild Ricardo. Echo 10/05/22: EF <20%, mild LAE/RAE and mild Ricardo - continue farxiga 10mg  daily - continue metoprolol succinate 25mg  daily - continue lisinopril 2.5mg  daily; finish this out (~ 1 month's worth left) and then begin valsartan 40mg  daily; discussed transitioning to entresto but he's a little concerned about the BID dosing; will change to valsartan and then try changing to entresto - did not get labs at last visit due to transportation and says that he can't stay today - he says that he will get someone to bring him back one day this week to get them drawn - continue  using Mrs Sharilyn Sites for seasoning - still probably drinking more than 64 ounces   2: HTN- - BP 130/87 - saw PCP Ashley Royalty) 03/24 - BMP 10/20/22 showed sodium 137, potassium 4.3, creatinine 0.97 & GFR >60 - to get BMP done later this week  3: CAD- - saw cardiology (Agbor-Etang) 04/24 - continue atorvastatin 80mg  daily - continue clopidogrel 75mg  daily - continue ASA 81mg  daily LHC 06/22: Mid RCA lesion is 50% stenosed. 2nd Mrg lesion is 100% stenosed. Ost LAD to Prox LAD lesion is 20% stenosed.  Non-ST elevation myocardial infarction Patent stent proximal LAD  Distal occlusion small to moderate caliber OM 2  Mild to moderate reduced left ventricular function with apical dyskinesis  4: Stroke- - continues with OT/ PT couple of times/ week - unable to grasp things small with his right hand (dominant hand is his right)  5: Substance use- - uses marijuana frequently - smokes 3 cigarettes weekly; at his max, he was smoking 2 ppd - denies any cocaine use since the week before his stroke 12/23  Return in 2 months, sooner if needed.

## 2023-04-20 NOTE — Patient Instructions (Addendum)
[facility] and get [facility] work drawn. Go to the registration desk to get registered.    Finish your lisinopril and then begin valsartan once daily. Once you start valsartan, you will no longer take the lisinopril.

## 2023-04-21 ENCOUNTER — Ambulatory Visit: Payer: Medicaid Other | Admitting: Physical Therapy

## 2023-04-22 ENCOUNTER — Ambulatory Visit: Payer: Medicaid Other

## 2023-04-22 DIAGNOSIS — M6281 Muscle weakness (generalized): Secondary | ICD-10-CM | POA: Diagnosis not present

## 2023-04-22 DIAGNOSIS — R278 Other lack of coordination: Secondary | ICD-10-CM

## 2023-04-22 DIAGNOSIS — I63512 Cerebral infarction due to unspecified occlusion or stenosis of left middle cerebral artery: Secondary | ICD-10-CM

## 2023-04-23 ENCOUNTER — Ambulatory Visit: Payer: Medicaid Other | Admitting: Physical Therapy

## 2023-04-24 NOTE — Therapy (Signed)
OUTPATIENT OCCUPATIONAL THERAPY NEURO TREATMENT NOTE  Patient Name: Ricardo Yang Dement MRN: [id number] DOB:April 05, 1976, 47 y.o., male Today's Date: 04/24/2023  PCP: Dr. Joseph Berkshire REFERRING PROVIDER: Dr. Joseph Berkshire  END OF SESSION:  OT End of Session - 04/24/23 1134     Visit Number 21    Number of Visits 24    Date for OT Re-Evaluation 05/27/23    Authorization Type 27 max per calendar year shared with OT/PT, 1 visit remaining after 04/22/23    Authorization Time Period Reporting period beginning 04/15/23    Authorization - Visit Number 26    Authorization - Number of Visits 27    Progress Note Due on Visit 10    OT Start Time 1515    OT Stop Time 1600    OT Time Calculation (min) 45 min    Equipment Utilized During Treatment none    Activity Tolerance Patient tolerated treatment well    Behavior During Therapy [facility] for tasks assessed/performed            Past Medical History:  Diagnosis Date   Acute CVA (cerebrovascular accident) (HCC) 10/04/2022   Acute ST elevation myocardial infarction (STEMI) involving left anterior descending (LAD) coronary artery (HCC) 08/06/2019   Anxiety 09/2022   After stroke   CAD (coronary artery disease) 10/04/2022   Cerebrovascular accident (CVA) (HCC) 10/07/2022   Chest pain    CHF (congestive heart failure) (HCC)    Cocaine abuse (HCC)    Cocaine use 10/06/2022   Depression December 2023   When stroke happened   Elevated troponin    Encounter for imaging to screen for metal prior to MRI 10/07/2022   Heavy smoker    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 question possible MS   NSTEMI (non-ST elevated myocardial infarction) (HCC) 05/25/2015   Obesity    Polysubstance abuse (HCC)    a. tobacco, cocaine, crack, ecstasy, pills, "anything but injections"   Primary hypertension 10/05/2022   Sleep apnea    Always   STEMI (ST elevation myocardial infarction) (HCC) 08/06/2019   Past  Surgical History:  Procedure Laterality Date   CARDIAC CATHETERIZATION N/A 05/28/2015   Procedure: Left Heart Cath and Coronary Angiography;  Surgeon: Antonieta Iba, MD;  Location: [facility] INVASIVE CV LAB;  Service: Cardiovascular;  Laterality: N/A;   CORONARY ANGIOGRAPHY N/A 04/22/2021   Procedure: CORONARY ANGIOGRAPHY;  Surgeon: Marcina Millard, MD;  Location: [facility] INVASIVE CV LAB;  Service: Cardiovascular;  Laterality: N/A;   CORONARY/GRAFT ACUTE MI REVASCULARIZATION N/A 08/06/2019   Procedure: Coronary/Graft Acute MI Revascularization;  Surgeon: Marcina Millard, MD;  Location: [facility] INVASIVE CV LAB;  Service: Cardiovascular;  Laterality: N/A;   LEFT HEART CATH N/A 04/22/2021   Procedure: Left Heart Cath;  Surgeon: Marcina Millard, MD;  Location: [facility] INVASIVE CV LAB;  Service: Cardiovascular;  Laterality: N/A;   LEFT HEART CATH AND CORONARY ANGIOGRAPHY N/A 08/06/2019   Procedure: LEFT HEART CATH AND CORONARY ANGIOGRAPHY;  Surgeon: Marcina Millard, MD;  Location: [facility] INVASIVE CV LAB;  Service: Cardiovascular;  Laterality: N/A;   TEE WITHOUT CARDIOVERSION N/A 10/08/2022   Procedure: TRANSESOPHAGEAL ECHOCARDIOGRAM (TEE);  Surgeon: Debbe Odea, MD;  Location: [facility] ORS;  Service: Cardiovascular;  Laterality: N/A;  11:30am   Patient Active Problem List   Diagnosis Date Noted   Sleep apnea 01/01/2023   Dysfunction of right rotator cuff 01/01/2023   History of CVA with residual deficit 12/04/2022   Mixed hyperlipidemia 12/04/2022  History of MI (myocardial infarction) 12/04/2022   Insomnia 10/17/2022   Slow transit constipation 10/17/2022   Chronic pain of both knees 10/17/2022   History of substance abuse (HCC) 10/15/2022   HFrEF (heart failure with reduced ejection fraction) (HCC) 10/07/2022   Expressive aphasia 10/05/2022   Right hemiplegia (HCC) 10/05/2022   CAD S/P percutaneous coronary angioplasty 10/05/2022   Polysubstance abuse (HCC) 10/04/2022    Essential hypertension 04/22/2021   Ischemic chest pain [facility])    ONSET DATE: 10/04/22  REFERRING DIAG: I63.9 (ICD-10-CM) - CVA (cerebral vascular accident)   THERAPY DIAG:  Muscle weakness (generalized)  Other lack of coordination  Left middle cerebral artery stroke (HCC)  Rationale for Evaluation and Treatment: Rehabilitation  SUBJECTIVE:  SUBJECTIVE STATEMENT: Pt reports doing well today.  Pt acknowledged 1 additional therapy visit approved after today. Accompanied by: self  PERTINENT HISTORY: CVA in 12/23. Multiple Cardiac caths over the last 5 years, hx of polysubstance abuse  PRECAUTIONS: None  WEIGHT BEARING RESTRICTIONS: No  PAIN:  Are you having pain? Yes: NPRS scale: 2/10 Pain location: R hand Pain description: dull, achy Aggravating factors: over use Relieving factors: heat, stretch  FALLS: Has patient fallen in last 6 months? 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    PATIENT GOALS: "If I could use my hand 75%.  And to use my hand to use the bathroom."  Pt enjoyed playing cards and shooting pool, playing video games prior to CVA.  OBJECTIVE:  HAND DOMINANCE: Right  ADLs: Overall ADLs: performing ADLs predominantly with the Yang non-dominant hand Transfers/ambulation related to ADLs: without AD Eating: 1 handed cutting, Yang hand only Grooming: electric toothbrush, Yang hand only UB Dressing: assist with buttons and belt  LB Dressing: assist to tie shoes; performs all Yang handed Toileting: using Yang hand for toilet hygiene and clothing management Bathing: Yang hand only Tub Shower transfers: indep with  tub shower transfer Equipment: none  IADLs: Shopping: able to manage light shopping trips, but typically goes with a family member for the driving assistance Light housekeeping: Yang hand only  Meal Prep: cold meal prep, some hot meal prep when able to use Yang hand only Community mobility: Pt reports he has not regularly driven since CVA, but has driven a couple of times to the store.  Ambulatory without AD. Medication management: easy open pill bottles and pt uses a pill organizer and sets up independently  Financial management: pt reports he has no current bills Handwriting:  Holds a pen loosely with R hand and can make a mark on paper but not able to write his initials   MOBILITY STATUS:  ambulatory without AD; see PT note for details.  POSTURE COMMENTS:  R sided hemiparesis Sitting balance: Moves/returns truncal midpoint >2 inches in all planes  ACTIVITY TOLERANCE: Activity tolerance: WFL for assessment, to be further assessed in future visits  FUNCTIONAL OUTCOME MEASURES: FOTO: 37 ; predicted 43 03/06/23: 44.8 6/19: 51  UPPER EXTREMITY ROM:    Active ROM Right eval Left  Eval WNL Right 03/06/23 Right 04/15/23  Shoulder flexion 95  125 140  Shoulder  abduction      Shoulder adduction 68  118 123  Shoulder extension      Shoulder internal rotation Thumb to posterior hip  Thumb to mid lower back Thumb to mid lower back  Shoulder external rotation Thumb to R ear with slight chin tuck and head tilt  Thumb to posterior R ear without chin tuck or head tilt Able to touch behind head with slight chin tuck  Elbow flexion      Elbow extension      Wrist flexion      Wrist extension      Wrist ulnar deviation      Wrist radial deviation      Wrist pronation      Wrist supination      (Blank rows = not tested)  UPPER EXTREMITY MMT:     MMT Left eval Right eval Right 03/06/23 Right 04/15/23  Shoulder flexion 5 3- 3+ (within available range) 4-  Shoulder abduction 5 3- 3+ (within  available range) 4-  Shoulder adduction      Shoulder extension      Shoulder internal rotation 5 3- 4- (elbow at side) 4-  Shoulder external rotation 5 3- 3+ (elbow at side) 3+  Middle trapezius      Lower trapezius      Elbow flexion 5 3+ 4 4  Elbow extension 5 3+ 4 4+  Wrist flexion 5 4- 4 4+  Wrist extension 5 4 4+ 4+  Wrist ulnar deviation      Wrist radial deviation      Wrist pronation 5 3+ 4 4+  Wrist supination 5 3- 3+ 4-  (Blank rows = not tested)  HAND FUNCTION: Grip strength: Right: (3rd level on dynamometer) 4 lbs; Left: 108 lbs, Lateral pinch: Right: 2 lbs, Left: 22 lbs, and 3 point pinch: Right: 0 lbs, Left: 21 lbs (Saehan pinch gauge) 03/06/23: Grip strength: Right: (3rd level on dynamometer) 8 lbs; Left: 105 lbs, Lateral pinch: Right: 6 lbs, Left: 27 lbs, and 3 point pinch: Right: 4 lbs, Left: 17 lbs 04/15/23: Grip strength: Right: (3rd level on dynamometer) 11 lbs; Lateral pinch: Right: 4 lbs, and 3 point pinch: Right: unable to maintain fingers on pinch gauge today  COORDINATION: Finger Nose Finger test: RUE with difficulty 9 Hole Peg test: Right: unable sec; Left: 31.5 sec 03/06/23: R: unable; was able to remove a peg to place back into dish 04/15/23: R: unable  SENSATION: Light touch: Impaired  pins and needles down the arm   EDEMA: R hand moderately edematous: 24 cm circumferentially at the MCPs of digits 2-5 (Yang 22.5 cm) 03/06/23: 23.3 cm  MUSCLE TONE: RUE: Mild and Hypertonic  COGNITION: Overall cognitive status: Within functional limits for tasks assessed  VISION: Baseline vision: No visual deficits  VISION ASSESSMENT: WFL  PERCEPTION: WFL  PRAXIS: Impaired: Motor planning  TODAY'S TREATMENT:  DATE: 04/22/23 Moist heat applied to R shoulder for pain reduction/muscle relaxation during completion of therapeutic exercises and  neuro re-ed activities noted below.    Therapeutic Exercise: In sitting performed reps of passive stretching for R shoulder ER/IR, shoulder flex, abd, R forearm supination, and R wrist and digit extension in prep for engaging the RUE into neuro re-ed activities noted below.  Facilitated R forearm, wrist, and hand strengthening with participation in EZ board tools.  Pt worked with long handled tool to facilitate R wrist flex/ext and forearm pron/sup, attempted large base key turn (unable to maintain prehension d/t flexor synergy.  Pt worked with large dial turn to facilitate R hand digit flex/ext x 1-2 reps for each tool (up/down board=1 rep).  Rest breaks between sets and min-mod A to maintain tools level on wide strip of velcro.  Neuro re-ed: Facilitated R hand [facility] skills working to remove jumbo pegs from pegboard using a 2 point pinch.  Vc and demo for prehension patterns.  Min vc for intermittent passive wrist and digit extension stretching and WB through the hand at table top for tone reduction.  OT provided intermittent hand over hand assist to grasp pegs with a 3 point pinch, but pt unable to sustain this position independently.  Facilitated active digit extension with reps of rolling peg on table top from closed fist to active digit extension.    PATIENT EDUCATION: Education details: Vivistim (provided pamphlet and will plan to follow up with referral) Person educated: Patient Education method: Explanation Education comprehension: verbalized understanding  HOME EXERCISE PROGRAM: Theraputty exercises, cane stretches, digit AROM/AAROM/PROM/place and hold exercises for digit extension; lateral and 3 point pinching to grasp small/light items from table top.  GOALS: Goals reviewed with patient? Yes  SHORT TERM GOALS: Target date: 03/11/23 (6 weeks)  Pt will be indep to perform HEP for improving RUE strength and coordination. Baseline: Eval: not yet initiated; 03/06/23: indep and consistent  with current program; will continue to progress as necessary Goal status: achieved  LONG TERM GOALS: Target date: 05/27/23   Pt will increase FOTO score to 43 or better to indicate improvement in self perceived functional use of the R arm with daily tasks. Baseline: Eval: 37; 03/06/23: 44.8; 04/15/23: 51 Goal status: achieved/ongoing  2.  Pt will increase R grip strength by 15 or more lbs in order to improve ability to hold and carry light ADL supplies in R dominant hand.    Baseline: Eval: R grip 4 lbs, Yang 108 lbs (3rd slot on dynamometer); 03/06/23: R grip 8 lbs; 04/15/23: 11 lbs (3rd slot) Goal status: ongoing  3.  Pt will increase R lateral pinch strength to be able to manipulate playing cards with minimal dropping. Baseline: Eval: R 2 lbs (Yang 22 lbs); pt has not attempted playing cards since CVA; 03/06/23: R lateral pinch 6 lbs; 04/15/23: R lateral pinch 4 lbs Goal status: ongoing  4.  Pt will improve FMC/dexterity skills in R hand to be able to engage the R hand to assist the Yang with clothing fasteners. Baseline: Eval: R 9 hole (unable), Yang 31.5 sec; manages clothing fasteners with Yang hand only; 03/06/23: R 9 hole: unable; R hand can grossly grasp clothing to help with stabilizing while Yang hand manages fasteners; 04/15/23: same as 03/06/23 Goal status: ongoing  5.  Pt will increase RUE strength and coordination to be able to use the RUE as a good assist to the Yang during ADL/IADL tasks. Baseline: Eval: manages all ADLs with  the Yang non-dominant hand; 03/06/23: Pt is using R hand as a stabilizer at least 75% of the time with ADLs; all strength measures improving throughout RUE (see chart above) and pt uses R hand as a poor-fair assist; 04/15/23: R hand used as a fair assist Goal status: achieved/revised/ongoing  6.  Pt will sign name for documents with R dominant hand, adapted pen as needed, with 100% legibility and demonstrate ability to isolate wrist and finger movements from forearm and upper arm while  writing. Baseline: Eval: Pt holds standard pen and can make marks on paper but not yet formulate letters of his name; 03/06/23: pt can print name with 90% legibility using a built up foam grip on a standard pen (pt does yet not isolate finger and wrist movements from the forearm and upper arm when writing); 04/15/23: same as 03/06/23 Goal status: revised/ongoing  ASSESSMENT: CLINICAL IMPRESSION: Pt seen for 21st OT visit.  Pt will have 1 additional visit he has a combined visit count of 27 for PT/OT per calendar year.  Next visit will be 27 including the PT count.  Continued focus on normalizing tone throughout the R wrist and hand to enable pt to formulate 2 and 3 point pinch patterns.  Hand over hand assist to grasp jumbo pegs with a 3 point pinch, but pt unable to sustain this position independently.  Pt is increasing consistency to grasp a peg with a 2 point pinch pattern, but does require frequent passive digit extension stretching and WB through the hand to minimize tone between every couple of reps.  OT continues to encourage pt that continued functional gains are anticipated as pt remains consistent with his participation in his HEP and has remained diligent in his attempts at engaging the R hand into all daily tasks.  Will plan d/c next visit with plan for pt to follow up with referrals from MD for a Vivistim eval and [facility] unit.  Pt in agreement with plan.  PERFORMANCE DEFICITS: in functional skills including ADLs, IADLs, coordination, dexterity, sensation, edema, tone, ROM, strength, pain, flexibility, Fine motor control, Gross motor control, balance, body mechanics, decreased knowledge of use of DME, and UE functional use, and psychosocial skills including coping strategies, habits, and routines and behaviors.   IMPAIRMENTS: are limiting patient from ADLs, IADLs, work, leisure, and social participation.   CO-MORBIDITIES: may have co-morbidities  that affects occupational performance.  Patient will benefit from skilled OT to address above impairments and improve overall function.  MODIFICATION OR ASSISTANCE TO COMPLETE EVALUATION: No modification of tasks or assist necessary to complete an evaluation.  OT OCCUPATIONAL PROFILE AND HISTORY: Problem focused assessment: Including review of records relating to presenting problem.  CLINICAL DECISION MAKING: Moderate - several treatment options, min-mod task modification necessary  REHAB POTENTIAL: Good  EVALUATION COMPLEXITY: Moderate    PLAN:  OT FREQUENCY: 1-2x/week  OT DURATION: 12 weeks  PLANNED INTERVENTIONS: self care/ADL training, therapeutic exercise, therapeutic activity, neuromuscular re-education, manual therapy, passive range of motion, balance training, splinting, electrical stimulation, paraffin, moist heat, cryotherapy, contrast bath, patient/family education, coping strategies training, and DME and/or AE instructions  RECOMMENDED OTHER SERVICES: None at this time  CONSULTED AND AGREED WITH PLAN OF CARE: Patient  PLAN FOR NEXT SESSION: see above  Danelle Earthly, MS, OTR/Yang  Otis Dials, OT 04/24/2023, 11:37 AM

## 2023-04-29 ENCOUNTER — Ambulatory Visit: Payer: Medicaid Other | Attending: Family Medicine

## 2023-04-29 DIAGNOSIS — I63512 Cerebral infarction due to unspecified occlusion or stenosis of left middle cerebral artery: Secondary | ICD-10-CM | POA: Diagnosis present

## 2023-04-29 DIAGNOSIS — M6281 Muscle weakness (generalized): Secondary | ICD-10-CM | POA: Insufficient documentation

## 2023-04-29 DIAGNOSIS — R278 Other lack of coordination: Secondary | ICD-10-CM | POA: Diagnosis present

## 2023-04-29 NOTE — Therapy (Signed)
OUTPATIENT OCCUPATIONAL THERAPY NEURO DISCHARGE NOTE  Patient Name: Ricardo Yang MRN: [id number] DOB:02-21-1976, 47 y.o., male Today's Date: 05/04/2023  PCP: Dr. Joseph Berkshire REFERRING PROVIDER: Dr. Joseph Berkshire  END OF SESSION:  OT End of Session - 05/04/23 0842     Visit Number 22    Number of Visits 24    Date for OT Re-Evaluation 05/27/23    Authorization Type 27 max per calendar year shared with OT/PT, 1 visit remaining after 04/22/23    Authorization Time Period Reporting period beginning 04/15/23    Authorization - Visit Number 27    Authorization - Number of Visits 27    Progress Note Due on Visit 10    OT Start Time 1415    OT Stop Time 1515    OT Time Calculation (min) 60 min    Equipment Utilized During Treatment none    Activity Tolerance Patient tolerated treatment well    Behavior During Therapy [facility] for tasks assessed/performed            Past Medical History:  Diagnosis Date   Acute CVA (cerebrovascular accident) (HCC) 10/04/2022   Acute ST elevation myocardial infarction (STEMI) involving left anterior descending (LAD) coronary artery (HCC) 08/06/2019   Anxiety 09/2022   After stroke   CAD (coronary artery disease) 10/04/2022   Cerebrovascular accident (CVA) (HCC) 10/07/2022   Chest pain    CHF (congestive heart failure) (HCC)    Cocaine abuse (HCC)    Cocaine use 10/06/2022   Depression December 2023   When stroke happened   Elevated troponin    Encounter for imaging to screen for metal prior to MRI 10/07/2022   Heavy smoker    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 question possible MS   NSTEMI (non-ST elevated myocardial infarction) (HCC) 05/25/2015   Obesity    Polysubstance abuse (HCC)    a. tobacco, cocaine, crack, ecstasy, pills, "anything but injections"   Primary hypertension 10/05/2022   Sleep apnea    Always   STEMI (ST elevation myocardial infarction) (HCC) 08/06/2019   Past  Surgical History:  Procedure Laterality Date   CARDIAC CATHETERIZATION N/A 05/28/2015   Procedure: Left Heart Cath and Coronary Angiography;  Surgeon: Antonieta Iba, MD;  Location: [facility] INVASIVE CV [facility];  Service: Cardiovascular;  Laterality: N/A;   CORONARY ANGIOGRAPHY N/A 04/22/2021   Procedure: CORONARY ANGIOGRAPHY;  Surgeon: Marcina Millard, MD;  Location: [facility] INVASIVE CV LAB;  Service: Cardiovascular;  Laterality: N/A;   CORONARY/GRAFT ACUTE MI REVASCULARIZATION N/A 08/06/2019   Procedure: Coronary/Graft Acute MI Revascularization;  Surgeon: Marcina Millard, MD;  Location: [facility] INV[facility]IVE CV LAB;  Service: Cardiovascular;  Laterality: N/A;   LEFT HEART CATH N/A 04/22/2021   Procedure: Left Heart Cath;  Surgeon: Marcina Millard, MD;  Location: [facility] INV[facility]IVE CV LAB;  Service: Cardiovascular;  Laterality: N/A;   LEFT HEART CATH AND CORONARY ANGIOGRAPHY N/A 08/06/2019   Procedure: LEFT HEART CATH AND CORONARY ANGIOGRAPHY;  Surgeon: Marcina Millard, MD;  Location: [facility] INVASIVE CV LAB;  Service: Cardiovascular;  Laterality: N/A;   TEE WITHOUT CARDIOVERSION N/A 10/08/2022   Procedure: TRANSESOPHAGEAL ECHOCARDIOGRAM (TEE);  Surgeon: Debbe Odea, MD;  Location: [facility] ORS;  Service: Cardiovascular;  Laterality: N/A;  11:30am   Patient Active Problem List   Diagnosis Date Noted   Sleep apnea 01/01/2023   Dysfunction of right rotator cuff 01/01/2023   History of CVA with residual deficit 12/04/2022   Mixed hyperlipidemia 12/04/2022  History of MI (myocardial infarction) 12/04/2022   Insomnia 10/17/2022   Slow transit constipation 10/17/2022   Chronic pain of both knees 10/17/2022   History of substance abuse (HCC) 10/15/2022   HFrEF (heart failure with reduced ejection fraction) (HCC) 10/07/2022   Expressive aphasia 10/05/2022   Right hemiplegia (HCC) 10/05/2022   CAD S/P percutaneous coronary angioplasty 10/05/2022   Polysubstance abuse (HCC) 10/04/2022    Essential hypertension 04/22/2021   Ischemic chest pain [facility])    ONSET DATE: 10/04/22  REFERRING DIAG: I63.9 (ICD-10-CM) - CVA (cerebral vascular accident)   THERAPY DIAG:  Muscle weakness (generalized)  Other lack of coordination  Left middle cerebral artery stroke (HCC)  Rationale for Evaluation and Treatment: Rehabilitation  SUBJECTIVE:  SUBJECTIVE STATEMENT: Pt reports doing well today.  Pt acknowledged d/c plan today d/t insurance limitations. Accompanied by: self  PERTINENT HISTORY: CVA in 12/23. Multiple Cardiac caths over the last 5 years, hx of polysubstance abuse  PRECAUTIONS: None  WEIGHT BEARING RESTRICTIONS: No  PAIN:  Are you having pain? Yes: NPRS scale: 0 at rest, with activity 1/10 Pain location: R shoulder Pain description: dull, achy Aggravating factors: over use Relieving factors: heat, stretch  FALLS: Has patient fallen in last 6 months? 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    PATIENT GOALS: "If I could use my hand 75%.  And to use my hand to use the bathroom."  Pt enjoyed playing cards and shooting pool, playing video games prior to CVA.  OBJECTIVE:  HAND DOMINANCE: Right  ADLs: Overall ADLs: performing ADLs predominantly with the L non-dominant hand Transfers/ambulation related to ADLs: without AD Eating: 1 handed cutting, L hand only Grooming: electric toothbrush, L hand only UB Dressing: assist with buttons and belt  LB Dressing: assist to tie shoes; performs all L handed Toileting: using L hand for toilet hygiene and clothing management Bathing: L hand only Tub Shower  transfers: indep with tub shower transfer Equipment: none  IADLs: Shopping: able to manage light shopping trips, but typically goes with a family member for the driving assistance Light housekeeping: L hand only  Meal Prep: cold meal prep, some hot meal prep when able to use L hand only Community mobility: Pt reports he has not regularly driven since CVA, but has driven a couple of times to the store.  Ambulatory without AD. Medication management: easy open pill bottles and pt uses a pill organizer and sets up independently  Financial management: pt reports he has no current bills Handwriting:  Holds a pen loosely with R hand and can make a mark on paper but not able to write his initials   MOBILITY STATUS:  ambulatory without AD; see PT note for details.  POSTURE COMMENTS:  R sided hemiparesis Sitting balance: Moves/returns truncal midpoint >2 inches in all planes  ACTIVITY TOLERANCE: Activity tolerance: WFL for assessment, to be further assessed in future visits  FUNCTIONAL OUTCOME MEASURES: FOTO: 37 ; predicted 43 03/06/23: 44.8 6/19: 51 04/29/23: 47  UPPER EXTREMITY ROM:    Active ROM Right eval Left  Eval WNL Right 03/06/23 Right 04/15/23 Right  04/29/23  Shoulder flexion 95  125 140 138 (R arm internally rotated and pronated   Shoulder abduction       Shoulder adduction 68  118 123 115  Shoulder extension       Shoulder internal rotation Thumb to posterior hip  Thumb to mid lower back Thumb to mid lower back Thumb to mid lower back  Shoulder external rotation Thumb to R ear with slight chin tuck and head tilt  Thumb to posterior R ear without chin tuck or head tilt Able to touch behind head with slight chin tuck Able to touch behind head without chin tuck  Elbow flexion       Elbow extension       Wrist flexion       Wrist extension       Wrist ulnar deviation       Wrist radial deviation       Wrist pronation       Wrist supination       (Blank rows = not  tested)  UPPER EXTREMITY MMT:     MMT Left eval Right eval Right 03/06/23 Right 04/15/23 Right 04/29/23  Shoulder flexion 5 3- 3+ (within available range) 4- 4-  Shoulder abduction 5 3- 3+ (within available range) 4- 4-  Shoulder adduction       Shoulder extension       Shoulder internal rotation 5 3- 4- (elbow at side) 4- 4  Shoulder external rotation 5 3- 3+ (elbow at side) 3+ 4-  Middle trapezius       Lower trapezius       Elbow flexion 5 3+ 4 4 4+  Elbow extension 5 3+ 4 4+ 4+  Wrist flexion 5 4- 4 4+ 4+  Wrist extension 5 4 4+ 4+ 4+  Wrist ulnar deviation       Wrist radial deviation       Wrist pronation 5 3+ 4 4+ 4+  Wrist supination 5 3- 3+ 4- 4-  (Blank rows = not tested)  HAND FUNCTION: Grip strength: Right: (3rd level on dynamometer) 4 lbs; Left: 108 lbs, Lateral pinch: Right: 2 lbs, Left: 22 lbs, and 3 point pinch: Right: 0 lbs, Left: 21 lbs (Saehan pinch gauge) 03/06/23: Grip strength: Right: (3rd level on dynamometer) 8 lbs; Left: 105 lbs, Lateral pinch: Right: 6 lbs, Left: 27 lbs, and 3 point pinch: Right: 4 lbs, Left: 17 lbs 04/15/23: Grip strength: Right: (3rd level on dynamometer) 11 lbs; Lateral pinch: Right: 4 lbs, and 3 point pinch: Right: unable to maintain fingers on pinch gauge today 04/29/23: Grip strength: Right: (3rd level on dynamometer) 9 lbs; Lateral pinch: Right: 4 lbs, and 3 point pinch: Right: unable to maintain fingers on pinch gauge today  COORDINATION: Finger Nose Finger test: RUE with difficulty 9 Hole Peg test: Right: unable sec; Left: 31.5 sec 03/06/23: R: unable; was able to remove a peg to place back into dish 04/15/23: R: unable 04/29/23: R: unable   SENSATION: Light touch: Impaired  pins and needles down the arm; more constant 04/29/23: Reports pins and needles down the arm only on occasion  EDEMA: R hand moderately edematous: 24 cm circumferentially at the MCPs of digits 2-5 (L 22.5 cm) 03/06/23: 23.3 cm  MUSCLE TONE: RUE: Mild and  Hypertonic  COGNITION: Overall cognitive status: Within functional limits for tasks assessed  VISION: Baseline vision: No visual deficits  VISION ASSESSMENT: WFL  PERCEPTION: WFL  PRAXIS: Impaired: Motor planning  TODAY'S TREATMENT:  DATE: 04/29/23 Moist heat applied to R shoulder for pain reduction/muscle relaxation during completion of therapeutic exercises and neuro re-ed activities noted below.    Therapeutic Exercise: Objective measures obtained and goals reviewed/updated for discharge.  Reviewed progress towards goals.  In sitting performed reps of passive stretching for R shoulder ER/IR, shoulder flex, abd, R forearm supination, and R wrist and digit extension in prep for engaging RUE into daily tasks.  HEP reviewed; pt able to verbalize and or demo exercises, including putty, theraband, cane stretches, self passive stretching throughout the RUE.  PATIENT EDUCATION: Education details: HEP review; issued built up/angled eating utensils and made recommendation for playing card holder Person educated: Patient Education method: Explanation Education comprehension: verbalized understanding  HOME EXERCISE PROGRAM: Theraputty exercises, cane stretches, digit AROM/AAROM/PROM/place and hold exercises for digit extension; lateral and 3 point pinching to grasp small/light items from table top.  GOALS: Goals reviewed with patient? Yes  SHORT TERM GOALS: Target date: 03/11/23 (6 weeks)  Pt will be indep to perform HEP for improving RUE strength and coordination. Baseline: Eval: not yet initiated; 03/06/23: indep and consistent with current program; will continue to progress as necessary Goal status: achieved  LONG TERM GOALS: Target date: 05/27/23   Pt will increase FOTO score to 43 or better to indicate improvement in self perceived functional use of the R arm  with daily tasks. Baseline: Eval: 37; 03/06/23: 44.8; 04/15/23: 51; 04/29/23: 47 Goal status: achieved  2.  Pt will increase R grip strength by 15 or more lbs in order to improve ability to hold and carry light ADL supplies in R dominant hand.    Baseline: Eval: R grip 4 lbs, L 108 lbs (3rd slot on dynamometer); 03/06/23: R grip 8 lbs; 04/15/23: 11 lbs (3rd slot); 04/29/23: 9 lbs Goal status: Improved/not achieved (limited by spasticity)  3.  Pt will increase R lateral pinch strength to be able to manipulate playing cards with minimal dropping. Baseline: Eval: R 2 lbs (L 22 lbs); pt has not attempted playing cards since CVA; 03/06/23: R lateral pinch 6 lbs; 04/15/23: R lateral pinch 4 lbs; 04/29/23: R lateral pinch 4 lbs (unable to maintain grasp of playing cards; recommended card holder and presented options from internet photos); pt receptive to card holder Goal status: Improved/not achieved  4.  Pt will improve FMC/dexterity skills in R hand to be able to engage the R hand to assist the L with clothing fasteners. Baseline: Eval: R 9 hole (unable), L 31.5 sec; manages clothing fasteners with L hand only; 03/06/23: R 9 hole: unable; R hand can grossly grasp clothing to help with stabilizing while L hand manages fasteners; 04/15/23: same as 03/06/23; 04/29/23: R 9 hole (unable); pt is able to use R hand to grasp his shirt while the L manages the fasteners Goal status: Partially achieved  5.  Pt will increase RUE strength and coordination to be able to use the RUE as a good assist to the L during ADL/IADL tasks. Baseline: Eval: manages all ADLs with the L non-dominant hand; 03/06/23: Pt is using R hand as a stabilizer at least 75% of the time with ADLs; all strength measures improving throughout RUE (see chart above) and pt uses R hand as a poor-fair assist; 04/15/23: R hand used as a fair assist; 04/29/23: R hand used as a fair assist with ADLs Goal status: Partially achieved  6.  Pt will sign name for documents with  R dominant hand, adapted pen as needed, with 100%  legibility and demonstrate ability to isolate wrist and finger movements from forearm and upper arm while writing. Baseline: Eval: Pt holds standard pen and can make marks on paper but not yet formulate letters of his name; 03/06/23: pt can print name with 90% legibility using a built up foam grip on a standard pen (pt does yet not isolate finger and wrist movements from the forearm and upper arm when writing); 04/15/23: same as 03/06/23; 04/29/23: 90% legibility with a built up pen, but pt reports that he always uses L hand to sign post CVA Goal status: Partially achieved   ASSESSMENT: CLINICAL IMPRESSION: Pt seen for OT d/c this date after completing 22 OT visits and 5 PT visits, totaling 27 which is the max approved by his insurance for the calendar year.  Pt has improved FOTO score from 37 at eval to 47 at discharge.  Rue pain, proximal strength, and ROM have all improved significantly, with small gains in R hand strength.  R hand function remains limited by spasticity, though digit extension has improved to allow pt to initiate sufficient digit extension in prep for a gross grasp to stabilize ADL supplies.  Pt endorses engaging the RUE into nearly all ADL tasks, using the R as a fair stabilizer to the L.  OT has initiated referral for pt to obtain a SaeboStim One home Estim unit for continued management of RUE spasticity.  Pt would also benefit from referral for Botox to manage spasticity throughout the RUE.  OT also recommends referral to [facility] in [place] at [facility] to assess whether pt may be a candidate for Vivistim.   Pt is indep with his HEP and remains diligent with his exercises and continues his attempts at using the RUE as often as able with daily tasks.  Educated pt on the importance of ongoing focus on his HEP over the next year to maximize his motor recovery in the first 1.5 years post CVA, and OT has encouraged pt seek  out a new referral to restart OT in the new year.  Pt in agreement/receptive to all recommendations.  Pt verbalized understanding of all education provided.    PERFORMANCE DEFICITS: in functional skills including ADLs, IADLs, coordination, dexterity, sensation, edema, tone, ROM, strength, pain, flexibility, Fine motor control, Gross motor control, balance, body mechanics, decreased knowledge of use of DME, and UE functional use, and psychosocial skills including coping strategies, habits, and routines and behaviors.   IMPAIRMENTS: are limiting patient from ADLs, IADLs, work, leisure, and social participation.   CO-MORBIDITIES: may have co-morbidities  that affects occupational performance. Patient will benefit from skilled OT to address above impairments and improve overall function.  MODIFICATION OR ASSISTANCE TO COMPLETE EVALUATION: No modification of tasks or assist necessary to complete an evaluation.  OT OCCUPATIONAL PROFILE AND HISTORY: Problem focused assessment: Including review of records relating to presenting problem.  CLINICAL DECISION MAKING: Moderate - several treatment options, min-mod task modification necessary  REHAB POTENTIAL: Good  EVALUATION COMPLEXITY: Moderate    PLAN:  OT FREQUENCY: 1-2x/week  OT DURATION: 12 weeks  PLANNED INTERVENTIONS: self care/ADL training, therapeutic exercise, therapeutic activity, neuromuscular re-education, manual therapy, passive range of motion, balance training, splinting, electrical stimulation, paraffin, moist heat, cryotherapy, contrast bath, patient/family education, coping strategies training, and DME and/or AE instructions  RECOMMENDED OTHER SERVICES: None at this time  CONSULTED AND AGREED WITH PLAN OF CARE: Patient  PLAN FOR NEXT SESSION: N/A; d/c this date  Danelle Earthly, MS, OTR/L  Otis Dials, OT 05/04/2023, 8:46 AM

## 2023-05-01 ENCOUNTER — Other Ambulatory Visit: Payer: Self-pay

## 2023-05-04 ENCOUNTER — Other Ambulatory Visit: Payer: Self-pay

## 2023-05-04 MED ORDER — CHLORHEXIDINE GLUCONATE 0.12 % MT SOLN
15.0000 mL | Freq: Two times a day (BID) | OROMUCOSAL | 0 refills | Status: DC
  Filled 2023-05-04: qty 473, 16d supply, fill #0

## 2023-05-04 MED ORDER — HYDROCODONE-ACETAMINOPHEN 5-325 MG PO TABS
1.0000 | ORAL_TABLET | Freq: Four times a day (QID) | ORAL | 0 refills | Status: DC | PRN
  Filled 2023-05-04: qty 7, 2d supply, fill #0

## 2023-05-06 ENCOUNTER — Ambulatory Visit: Payer: Medicaid Other

## 2023-05-07 ENCOUNTER — Ambulatory Visit: Payer: Medicaid Other | Admitting: Physical Therapy

## 2023-05-07 ENCOUNTER — Ambulatory Visit: Payer: Medicaid Other

## 2023-05-11 ENCOUNTER — Ambulatory Visit: Payer: Medicaid Other

## 2023-05-12 ENCOUNTER — Other Ambulatory Visit: Payer: Self-pay

## 2023-05-13 ENCOUNTER — Ambulatory Visit: Payer: Medicaid Other

## 2023-05-18 ENCOUNTER — Ambulatory Visit: Payer: Medicaid Other

## 2023-05-20 ENCOUNTER — Ambulatory Visit: Payer: Medicaid Other

## 2023-05-21 ENCOUNTER — Ambulatory Visit: Payer: Medicaid Other

## 2023-05-25 ENCOUNTER — Ambulatory Visit: Payer: Medicaid Other

## 2023-05-27 ENCOUNTER — Ambulatory Visit: Payer: Medicaid Other

## 2023-06-01 ENCOUNTER — Ambulatory Visit: Payer: Medicaid Other

## 2023-06-03 ENCOUNTER — Ambulatory Visit: Payer: Medicaid Other

## 2023-06-03 ENCOUNTER — Ambulatory Visit: Payer: Medicaid Other | Admitting: Physical Therapy

## 2023-06-08 ENCOUNTER — Ambulatory Visit: Payer: Medicaid Other

## 2023-06-08 ENCOUNTER — Ambulatory Visit: Payer: Medicaid Other | Admitting: Physical Therapy

## 2023-06-10 ENCOUNTER — Ambulatory Visit: Payer: Medicaid Other | Admitting: Physical Therapy

## 2023-06-10 ENCOUNTER — Ambulatory Visit: Payer: Medicaid Other

## 2023-06-15 ENCOUNTER — Ambulatory Visit: Payer: Medicaid Other

## 2023-06-15 ENCOUNTER — Ambulatory Visit: Payer: Medicaid Other | Admitting: Physical Therapy

## 2023-06-17 ENCOUNTER — Ambulatory Visit: Payer: Medicaid Other

## 2023-06-17 ENCOUNTER — Ambulatory Visit: Payer: Medicaid Other | Admitting: Physical Therapy

## 2023-06-22 ENCOUNTER — Ambulatory Visit: Payer: Medicaid Other | Admitting: Physical Therapy

## 2023-06-22 ENCOUNTER — Encounter: Payer: Medicaid Other | Admitting: Family

## 2023-06-22 ENCOUNTER — Telehealth: Payer: Self-pay | Admitting: Family

## 2023-06-22 NOTE — Telephone Encounter (Signed)
Patient did not show for his [facility] appointment on 06/22/23.

## 2023-06-24 ENCOUNTER — Ambulatory Visit: Payer: Medicaid Other | Admitting: Physical Therapy

## 2023-06-25 ENCOUNTER — Encounter: Payer: Self-pay | Admitting: Neurology

## 2023-06-25 ENCOUNTER — Other Ambulatory Visit: Payer: Self-pay

## 2023-06-25 ENCOUNTER — Ambulatory Visit (INDEPENDENT_AMBULATORY_CARE_PROVIDER_SITE_OTHER): Payer: Medicaid Other | Admitting: Neurology

## 2023-06-25 VITALS — BP 146/94 | HR 88 | Ht 76.0 in | Wt 286.0 lb

## 2023-06-25 DIAGNOSIS — I63522 Cerebral infarction due to unspecified occlusion or stenosis of left anterior cerebral artery: Secondary | ICD-10-CM | POA: Diagnosis not present

## 2023-06-25 DIAGNOSIS — Z720 Tobacco use: Secondary | ICD-10-CM

## 2023-06-25 DIAGNOSIS — I63512 Cerebral infarction due to unspecified occlusion or stenosis of left middle cerebral artery: Secondary | ICD-10-CM

## 2023-06-25 DIAGNOSIS — G8191 Hemiplegia, unspecified affecting right dominant side: Secondary | ICD-10-CM

## 2023-06-25 DIAGNOSIS — F1910 Other psychoactive substance abuse, uncomplicated: Secondary | ICD-10-CM

## 2023-06-25 NOTE — Patient Instructions (Signed)
Continue current medications  Continue with exercise at home  We also discussed abstinence from cigarettes and e-cigarette and drugs (Cocaine)  Continue to follow up with PCP  Return in a year or sooner if worse

## 2023-06-25 NOTE — Progress Notes (Signed)
[facility]  PATIENT: Ricardo Yang DOB: 1976-09-17  REQUESTING CLINICIAN: Charlton Amor, PA-C HISTORY FROM: Patient/Chart review  REASON FOR VISIT: Stroke follow up    HISTORICAL  CHIEF COMPLAINT:  Chief Complaint  Patient presents with   Cerebrovascular Accident    Rm Cva: since hospital cva pt. reports painful knees from stoke and having right sided deficits in hand and arm    Hospitalization Follow-up    Was in hospital for CVA   New Patient (Initial Visit)    internal hospital referral    HISTORY OF PRESENT ILLNESS:  This is a 47 year old gentleman with multiple medical conditions including previous stroke, heart disease, STEMI and non-STEMI, ischemic cardiomyopathy, hypertension, polysubstance abuse and obesity who is presenting after being admitted to the hospital in December for right-sided weakness and aphasia and found to have left ACA and left MCA stroke.  At that time he was also found to have a right PCA stroke.  He did have extensive workup including echo, angiogram, he was also positive for cocaine as well as marijuana.  He stroke etiology possibly related to substance abuse, vasoconstriction from the cocaine use.  He remains on Plavix and aspirin.  There was no arrhythmia noted.  Upon discharge patient was in acute rehab.  He completed rehab, but stated that insurance will pay no more.  He was discharged from rehab, reports that he does not do the exercise daily.  Currently he does live with his mother and reports some increase stressors.  He continues to smoke 1 to 2 cigarettes/day and also e-cigarette.  He mentioned that he relapsed and has used cocaine since discharge from the hospital.  He does have some improvement in his right upper extremity weakness but the weakness is still present.  He reports his speech is better and his weakness is improving.  Hospital course and summary below.    Hospital course and summary  Ricardo L Frakes is a 47  y.o. right-handed male with history of reported CVA in the past without deficits, CAD with anterior STEMI 07/2019, non-STEMI 03/2021 status post PCI with complications of ischemic cardiomyopathy ejection fraction 25 to 30% from echocardiogram 6/23 maintained on aspirin and Plavix followed by cardiology services, hypertension, tobacco and polysubstance abuse as well as obesity with BMI 30.70.  Per chart review lives with significant other.  Reportedly independent prior to admission.  Presented 10/04/2022 with acute onset of aphasia and right-sided weakness with leftward gaze.  Cranial CT scan with no acute changes.  CT angiogram head and neck no large vessel occlusion.  No atherosclerosis, stenosis or arterial abnormality identified.  MRI showed acute infarct in the left ACA, left MCA and likely of the right PCA territory with a large infarct involving the left postcentral gyrus.  Patient did not receive tPA.  Admission chemistries unremarkable except WBC 11,300 alcohol negative, urine drug screen positive cocaine as well as marijuana, troponin 265-275.  Echocardiogram with ejection fraction less than 20%.  TEE completed by cardiology service showing severely reduced systolic function and normal mural apical thrombus with estimated ejection fraction of 20 to 25%.  Bubble study negative.  No PFO noted.  Elevated troponin felt to be related to demand ischemia.  Patient remained on aspirin and Plavix as prior to admission.  Tolerating a regular diet.  Therapy evaluations completed due to patient's decreased functional ability and aphasia was admitted for a comprehensive rehab program.  Hospital Course: Ricardo L Henneman was admitted to rehab 10/09/2022 for  inpatient therapies to consist of PT, ST and OT at least three hours five days a week. Past admission physiatrist, therapy team and rehab RN have worked together to provide customized collaborative inpatient rehab.  Pertaining to patient's left MCA infarction remained  stable he continued on aspirin and Plavix therapy and would follow-up neurology services.  History of CAD with PCI chronic systolic congestive heart failure with ejection fraction of 20 to 25%.  He remained on Farxiga.  He exhibited no signs of fluid overload.  He would follow-up with cardiology services.  TEE without thrombus.  Blood pressure controlled on lisinopril as well as Toprol will need outpatient follow-up.  Hyperlipidemia Lipitor as advised.  Obesity BMI 30.7 no dietary follow-up.  Bouts of constipation resolved with laxative assistance.  Chronic bilateral knee pain Voltaren gel as needed    OTHER MEDICAL CONDITIONS: Previous stroke, heart disease, STEMI and non-STEMI, ischemic cardiomyopathy, hypertension, polysubstance abuse and obesity    REVIEW OF SYSTEMS: Full 14 system review of systems performed and negative with exception of: As noted in the HPI   ALLERGIES: No Known Allergies  HOME MEDICATIONS: Outpatient Medications Prior to Visit  Medication Sig Dispense Refill   aspirin 81 MG chewable tablet Chew 1 tablet (81 mg total) by mouth daily. 30 tablet 5   atorvastatin (LIPITOR) 80 MG tablet Take 1 tablet (80 mg total) by mouth daily at 6 PM. 30 tablet 5   baclofen (LIORESAL) 10 MG tablet Take 10 mg by mouth at bedtime as needed for muscle spasms.     chlorhexidine (PERIDEX) 0.12 % solution Rinse mouth with 15 mLs for 30 seconds in the morning and at bedtime after teeth brushing. 473 mL 0   clopidogrel (PLAVIX) 75 MG tablet Take 1 tablet (75 mg total) by mouth daily with breakfast. 30 tablet 5   dapagliflozin propanediol (FARXIGA) 10 MG TABS tablet Take 1 tablet (10 mg total) by mouth daily. 30 tablet 5   HYDROcodone-acetaminophen (NORCO/VICODIN) 5-325 MG tablet Take 1 tablet by mouth every 6 (six) hours as needed for pain. 7 tablet 0   metoprolol succinate (TOPROL-XL) 25 MG 24 hr tablet Take 1 tablet (25 mg total) by mouth daily. Dose change 30 tablet 5   valsartan (DIOVAN) 40  MG tablet Take 1 tablet (40 mg total) by mouth daily. 30 tablet 5   No facility-administered medications prior to visit.    PAST MEDICAL HISTORY: Past Medical History:  Diagnosis Date   Acute CVA (cerebrovascular accident) (HCC) 10/04/2022   Acute ST elevation myocardial infarction (STEMI) involving left anterior descending (LAD) coronary artery (HCC) 08/06/2019   Anxiety 09/2022   After stroke   CAD (coronary artery disease) 10/04/2022   Cerebrovascular accident (CVA) (HCC) 10/07/2022   Chest pain    CHF (congestive heart failure) (HCC)    Cocaine abuse (HCC)    Cocaine use 10/06/2022   Depression December 2023   When stroke happened   Elevated troponin    Encounter for imaging to screen for metal prior to MRI 10/07/2022   Heavy smoker    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 question possible MS   NSTEMI (non-ST elevated myocardial infarction) (HCC) 05/25/2015   Obesity    Polysubstance abuse (HCC)    a. tobacco, cocaine, crack, ecstasy, pills, "anything but injections"   Primary hypertension 10/05/2022   Sleep apnea    Always   STEMI (ST elevation myocardial infarction) (HCC) 08/06/2019  PAST SURGICAL HISTORY: Past Surgical History:  Procedure Laterality Date   CARDIAC CATHETERIZATION N/A 05/28/2015   Procedure: Left Heart Cath and Coronary Angiography;  Surgeon: Antonieta Iba, MD;  Location: [facility] INVASIVE [facility];  Service: Cardiovascular;  Laterality: N/A;   CORONARY ANGIOGRAPHY N/A 04/22/2021   Procedure: CORONARY ANGIOGRAPHY;  Surgeon: Marcina Millard, MD;  Location: [facility] CV [facility];  Service: Cardiovascular;  Laterality: N/A;   CORONARY/GRAFT ACUTE MI REVASCULARIZATION N/A 08/06/2019   Procedure: Coronary/Graft Acute MI Revascularization;  Surgeon: Marcina Millard, MD;  Location: [facility];  Service: Cardiovascular;  Laterality: N/A;   LEFT HEART CATH N/A 04/22/2021   Procedure: Left  Heart Cath;  Surgeon: Marcina Millard, MD;  Location: [facility];  Service: Cardiovascular;  Laterality: N/A;   LEFT HEART CATH AND CORONARY ANGIOGRAPHY N/A 08/06/2019   Procedure: LEFT HEART CATH AND CORONARY ANGIOGRAPHY;  Surgeon: Marcina Millard, MD;  Location: [facility];  Service: Cardiovascular;  Laterality: N/A;   TEE WITHOUT CARDIOVERSION N/A 10/08/2022   Procedure: TRANSESOPHAGEAL ECHOCARDIOGRAM (TEE);  Surgeon: Debbe Odea, MD;  Location: [facility];  Service: Cardiovascular;  Laterality: N/A;  11:30am    FAMILY HISTORY: Family History  Problem Relation Age of Onset   Miscarriages / Stillbirths Mother    Heart disease Father    Hypertension Father    Depression Daughter    Alcohol abuse Maternal Grandfather    Cancer Maternal Grandfather    Depression Maternal Grandfather    Hypertension Maternal Grandfather    Stroke Maternal Grandfather    Cancer Maternal Grandmother    Hypertension Maternal Grandmother     SOCIAL HISTORY: Social History   Socioeconomic History   Marital status: Single    Spouse name: Not on file   Number of children: Not on file   Years of education: Not on file   Highest education level: Not on file  Occupational History   Not on file  Tobacco Use   Smoking status: Some Days    Current packs/day: 0.25    Average packs/day: 0.3 packs/day for 15.0 years (3.8 ttl pk-yrs)    Types: Cigarettes   Smokeless tobacco: Never   Tobacco comments:    Started as a teenager  Vaping Use   Vaping status: Never Used  Substance and Sexual Activity   Alcohol use: Not Currently    Alcohol/week: 1.0 standard drink of alcohol    Types: 1 Shots of liquor per week   Drug use: Yes    Frequency: 1.0 times per week    Types: Cocaine, Marijuana    Comment: 01/26/23 current marijuana no cocaine   Sexual activity: Not Currently    Partners: Female    Birth control/protection: Abstinence, Condom  Other Topics Concern   Not on  file  Social History Narrative   Not on file   Social Determinants of Health   Financial Resource Strain: Not on file  Food Insecurity: No Food Insecurity (01/01/2023)   Hunger Vital Sign    Worried About Running Out of Food in the Last Year: Never true    Ran Out of Food in the Last Year: Never true  Transportation Needs: No Transportation Needs (01/01/2023)   PRAPARE - Administrator, Civil Service (Medical): No    Lack of Transportation (Non-Medical): No  Physical Activity: Not on file  Stress: Not on file  Social Connections: Not on file  Intimate Partner Violence: Not At Risk (01/01/2023)   Humiliation, Afraid, Rape,  and Kick questionnaire    Fear of Current or Ex-Partner: No    Emotionally Abused: No    Physically Abused: No    Sexually Abused: No    PHYSICAL EXAM  GENERAL EXAM/CONSTITUTIONAL: Vitals:  Vitals:   06/25/23 1259  BP: (!) 146/94  Pulse: 88  Weight: 286 lb (129.7 kg)  Height: 6\' 4"  (1.93 m)   Body mass index is 34.81 kg/m. Wt Readings from Last 3 Encounters:  06/25/23 286 lb (129.7 kg)  04/20/23 278 lb 9.6 oz (126.4 kg)  03/12/23 271 lb 6.4 oz (123.1 kg)   Patient is in no distress; well developed, nourished and groomed; neck is supple  MUSCULOSKELETAL: Gait, strength, tone, movements noted in Neurologic exam below  NEUROLOGIC: MENTAL STATUS:      No data to display         awake, alert, oriented to person, place and time recent and remote memory intact normal attention and concentration language fluent, comprehension intact, naming intact fund of knowledge appropriate  CRANIAL NERVE:  2nd, 3rd, 4th, 6th - Visual fields full to confrontation, extraocular muscles intact, no nystagmus 5th - facial sensation symmetric 7th - facial strength symmetric 8th - hearing intact 9th - palate elevates symmetrically, uvula midline 11th - shoulder shrug symmetric 12th - tongue protrusion midline  MOTOR:  normal bulk and tone, full  strength in the BUE, BLE except for right shoulder abduction 4/5, elbow flexion/extension 5/5, wrist extension 4/5, finger intrinsics 4/5  SENSORY:  normal and symmetric to light touch  COORDINATION:  finger-nose-finger, fine finger movements normal  REFLEXES:  deep tendon reflexes present and symmetric  GAIT/STATION:  Hemiplegic gait    DIAGNOSTIC DATA (LABS, IMAGING, TESTING) - I reviewed patient records, labs, notes, testing and imaging myself where available.  Lab Results  Component Value Date   WBC 6.7 10/20/2022   HGB 17.0 10/20/2022   HCT 50.3 10/20/2022   MCV 84.3 10/20/2022   PLT 266 10/20/2022      Component Value Date/Time   NA 137 10/20/2022 0707   K 4.3 10/20/2022 0707   CL 106 10/20/2022 0707   CO2 22 10/20/2022 0707   GLUCOSE 104 (H) 10/20/2022 0707   BUN 15 10/20/2022 0707   CREATININE 0.97 10/20/2022 0707   CALCIUM 9.3 10/20/2022 0707   PROT 6.8 10/10/2022 0526   ALBUMIN 3.3 (L) 10/10/2022 0526   AST 21 10/10/2022 0526   ALT 22 10/10/2022 0526   ALKPHOS 46 10/10/2022 0526   BILITOT 0.7 10/10/2022 0526   GFRNONAA >60 10/20/2022 0707   GFRAA >60 08/08/2019 0606   Lab Results  Component Value Date   CHOL 155 10/05/2022   HDL 36 (L) 10/05/2022   LDLCALC 108 (H) 10/05/2022   TRIG 57 10/05/2022   CHOLHDL 4.3 10/05/2022   Lab Results  Component Value Date   HGBA1C 5.4 10/04/2022   No results found for: "VITAMINB12" Lab Results  Component Value Date   TSH 0.419 08/06/2019    MRI Brain 10/05/2023 1. Acute infarcts in the left ACA, left MCA, and likely the right PCA territory, with a large infarct involving the left postcentral gyrus. Given distribution, these are favored to represent infarcts related to a central embolic etiology. There is a focal region in the right occipital lobe where there is likely a small amount of subarachnoid blood products, which could suggest an underlying component of vasculitis. Recommend further evaluation with  echocardiography. 2. Redemonstrated periventricular and subcortical T2/FLAIR hyperintense signal, as well as at  the root entry zone of the right trigeminal nerve, which are suggestive of an underlying demyelinating disease.   CTA Head and Neck 10/04/2022 1. Suboptimal although adequate contrast bolus. Strong evidence on CTP of a small posterior Left MCA territory infarct core (approximately 12 mL). CTP also suggests approximately 60 mL of oligemia in the Left MCA territory. However, there is NO large vessel occlusion, and no discrete MCA branch occlusion detected on CTA. 2. No atherosclerosis, stenosis, or arterial abnormality identified in the head or neck. There is a left coronary artery stent visible in the upper chest. 3. Mild upper lung atelectasis and small volume retained secretions in the trachea.    ASSESSMENT AND PLAN  48 y.o. year old male with multiple medical conditions including previous stroke, heart disease, STEMI and non-STEMI, ischemic cardiomyopathy, hypertension, polysubstance abuse and obesity who is presenting after being admitted to the hospital for right-sided weakness and aphasia and found to have a left ACA, left MCA and right PCA stroke.  Stroke etiology likely large vessel disease, possibly secondary to vasoconstriction from cocaine use.  He was positive for cocaine and marijuana.  There was no arrhythmia noted while admitted.  He remained on aspirin and Plavix.  Patient completed rehab with improvement of his speech and weakness.  Reports insurance is not paying for rehab anymore and he does not continue with his daily exercise.  I have strongly encouraged him to continue with daily exercise.  He continues to smoke cigarettes and e-cigarette, again discussed importance of cigarette cessation.  He reports since discharge from the hospital he has used cocaine at least once.  Again strongly advised patient to remain off substance.  He voiced understanding.  He will continue to  follow-up with his PCP, again encouraged him to continue daily exercise and we will see him for follow-up in a year or sooner if worse.   1. Cerebrovascular accident (CVA) due to occlusion of left middle cerebral artery (HCC)   2. Cerebrovascular accident (CVA) due to occlusion of left anterior cerebral artery (HCC)   3. Right hemiplegia (HCC)   4. Nicotine use   5. Polysubstance abuse [facility])      Patient Instructions  Continue current medications  Continue with exercise at home  We also discussed abstinence from cigarettes and e-cigarette and drugs (Cocaine)  Continue to follow up with PCP  Return in a year or sooner if worse   No orders of the defined types were placed in this encounter.   No orders of the defined types were placed in this encounter.   Return in about 1 year (around 06/24/2024).  I have spent a total of 60 minutes dedicated to this patient today, preparing to see patient, performing a medically appropriate examination and evaluation, ordering tests and/or medications and procedures, and counseling and educating the patient/family/caregiver; independently interpreting result and communicating results to the family/patient/caregiver; and documenting clinical information in the electronic medical record.   Windell Norfolk, MD 06/25/2023, 5:12 PM  [facility] [address], [address] [place], [place] [address] [phone]

## 2023-06-26 ENCOUNTER — Other Ambulatory Visit: Payer: Self-pay

## 2023-07-06 ENCOUNTER — Ambulatory Visit: Payer: Medicaid Other | Admitting: Physical Therapy

## 2023-07-07 IMAGING — DX DG ELBOW COMPLETE 3+V*R*
4 series · 4 of 4 positions shown · non-contrast
Comparison: None

CLINICAL DATA: Pain after fall.

EXAM:
RIGHT ELBOW - COMPLETE 3+ VIEW

[elbow ap]
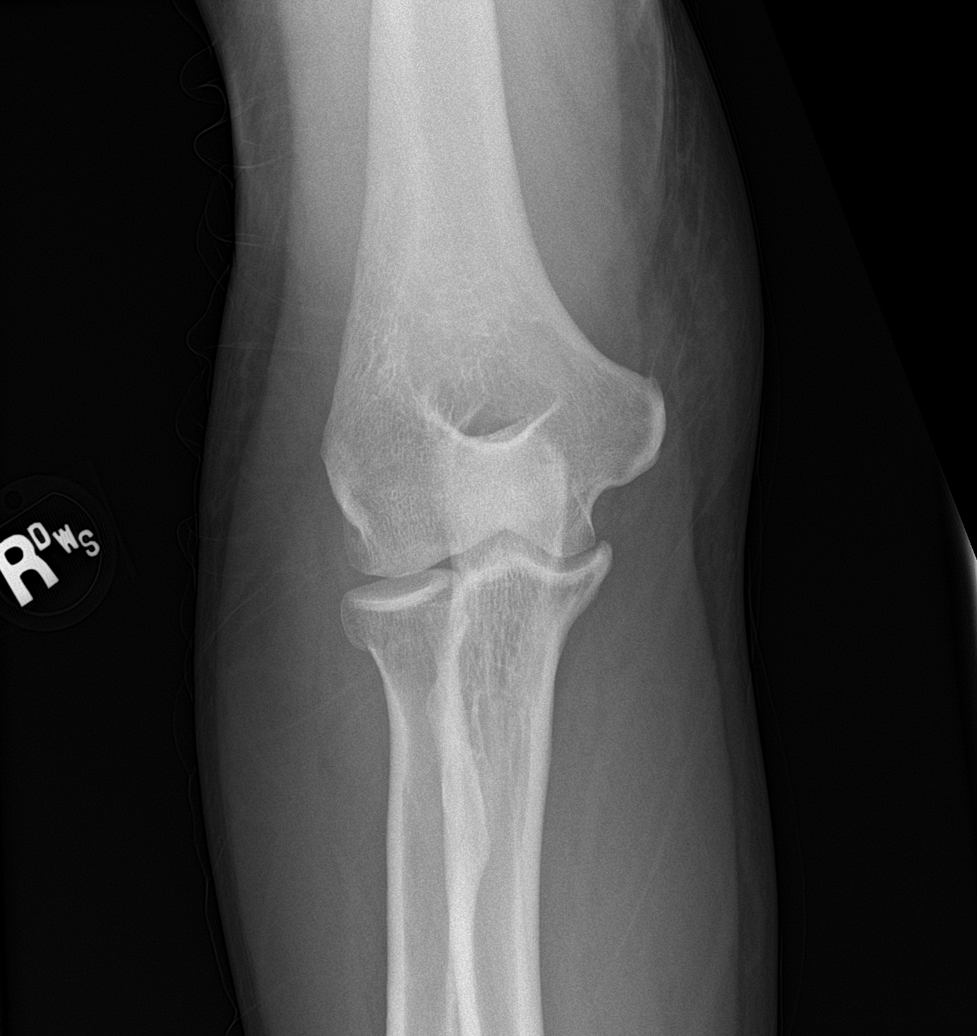

[elbow obl (1 of 2)]
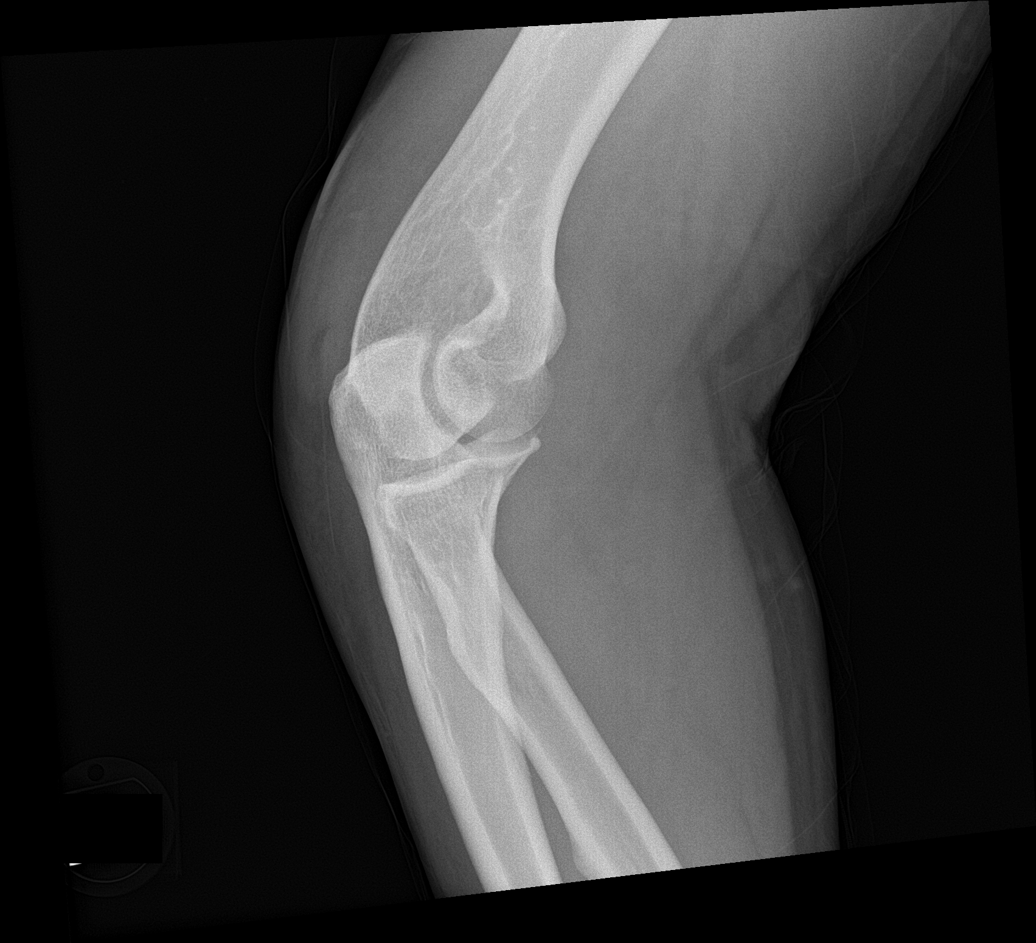

[elbow obl (2 of 2)]
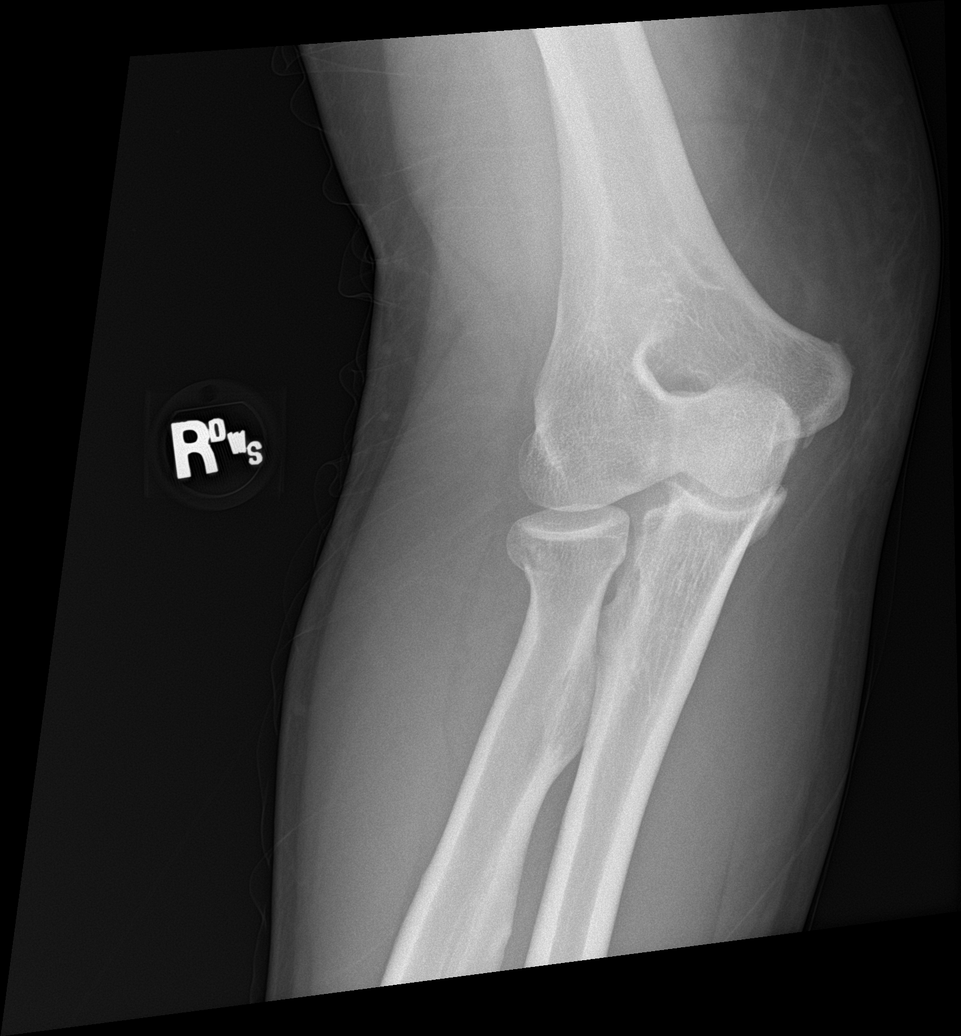

[elbow lat]
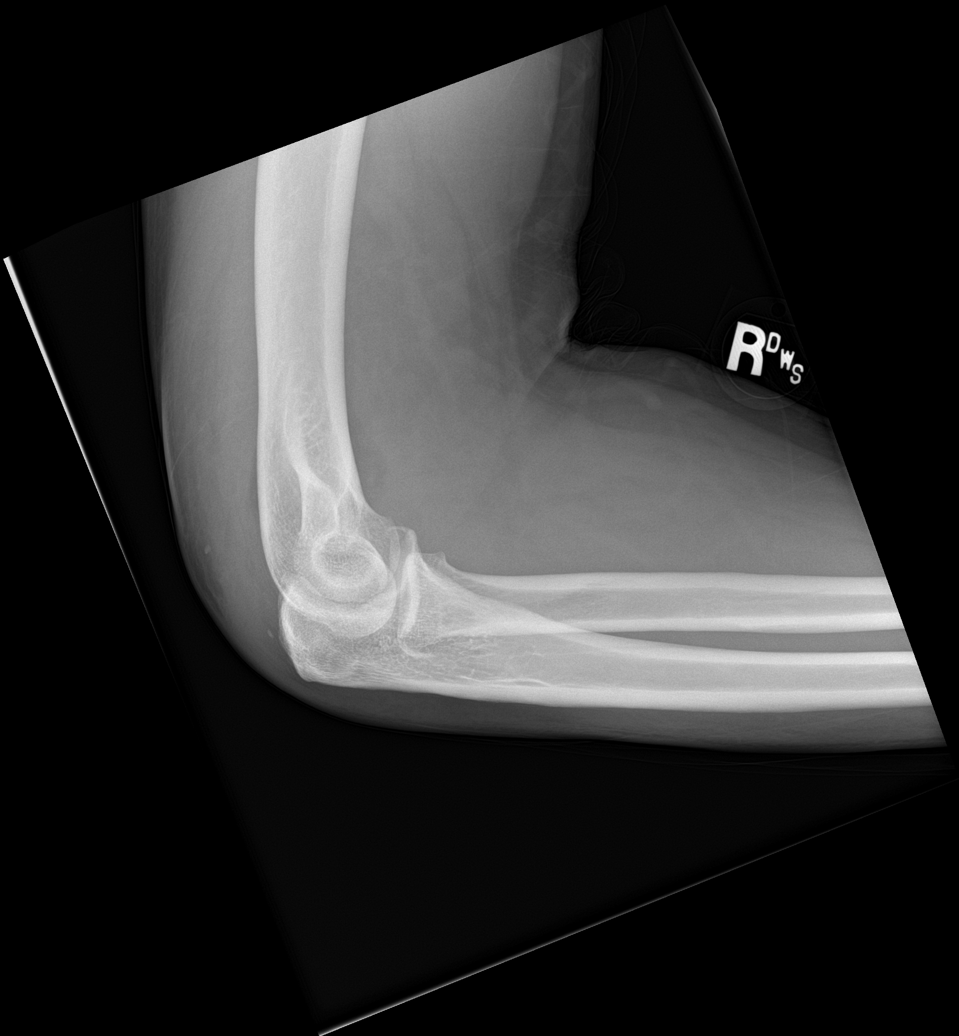

[4 of 4 positions shown; findings below may reference images not displayed]

FINDINGS: Small anterior joint effusion. There is mild cortical irregularity
involving the subcapital aspect of the radial head concerning for
nondisplaced fracture. No dislocation identified. No radio-opaque
foreign bodies.
IMPRESSION: 1. Suspect nondisplaced fracture involving the subcapital aspect of
the radial head.
2. Small anterior joint effusion.

## 2023-07-08 ENCOUNTER — Ambulatory Visit: Payer: Medicaid Other | Admitting: Physical Therapy

## 2023-07-13 ENCOUNTER — Ambulatory Visit: Payer: Medicaid Other | Admitting: Physical Therapy

## 2023-07-15 ENCOUNTER — Ambulatory Visit: Payer: Medicaid Other | Admitting: Physical Therapy

## 2023-07-20 ENCOUNTER — Ambulatory Visit: Payer: Medicaid Other | Admitting: Physical Therapy

## 2023-07-22 ENCOUNTER — Ambulatory Visit: Payer: Medicaid Other | Admitting: Physical Therapy

## 2023-07-23 ENCOUNTER — Other Ambulatory Visit: Payer: Self-pay

## 2023-07-27 ENCOUNTER — Ambulatory Visit: Payer: Medicaid Other | Admitting: Physical Therapy

## 2023-07-28 ENCOUNTER — Encounter: Payer: Self-pay | Admitting: Cardiology

## 2023-07-28 ENCOUNTER — Ambulatory Visit: Payer: Medicaid Other | Attending: Cardiology | Admitting: Cardiology

## 2023-07-28 VITALS — BP 142/80 | HR 75 | Ht 76.0 in | Wt 280.4 lb

## 2023-07-28 DIAGNOSIS — I2510 Atherosclerotic heart disease of native coronary artery without angina pectoris: Secondary | ICD-10-CM | POA: Diagnosis not present

## 2023-07-28 DIAGNOSIS — I10 Essential (primary) hypertension: Secondary | ICD-10-CM

## 2023-07-28 DIAGNOSIS — I255 Ischemic cardiomyopathy: Secondary | ICD-10-CM

## 2023-07-28 DIAGNOSIS — I5020 Unspecified systolic (congestive) heart failure: Secondary | ICD-10-CM

## 2023-07-28 DIAGNOSIS — I6930 Unspecified sequelae of cerebral infarction: Secondary | ICD-10-CM

## 2023-07-28 DIAGNOSIS — F1911 Other psychoactive substance abuse, in remission: Secondary | ICD-10-CM

## 2023-07-28 DIAGNOSIS — Z9861 Coronary angioplasty status: Secondary | ICD-10-CM

## 2023-07-28 NOTE — Addendum Note (Signed)
Addended by: Rocky Link A on: 07/28/2023 01:43 PM   Modules accepted: Orders

## 2023-07-28 NOTE — Patient Instructions (Signed)
Medication Instructions:  Your physician recommends that you continue on your current medications as directed. Please refer to the Current Medication list given to you today.  *If you need a refill on your cardiac medications before your next appointment, please call your pharmacy*  Lab Work: Your provider would like for you to have following labs drawn today BMET.   If you have labs (blood work) drawn today and your tests are completely normal, you will receive your results only by: MyChart Message (if you have MyChart) OR A paper copy in the mail If you have any lab test that is abnormal or we need to change your treatment, we will call you to review the results.  Testing/Procedures: -None ordered  Follow-Up: At [facility], you and your health needs are our priority.  As part of our continuing mission to provide you with exceptional heart care, we have created designated Provider Care Teams.  These Care Teams include your primary Cardiologist (physician) and Advanced Practice Providers (APPs -  Physician Assistants and Nurse Practitioners) who all work together to provide you with the care you need, when you need it.  Your next appointment:   6 month(s)  Provider:   You may see Debbe Odea, MD or one of the following Advanced Practice Providers on your designated Care Team:   Nicolasa Ducking, NP Eula Listen, PA-C Cadence Fransico Michael, PA-C Charlsie Quest, NP    Other Instructions -None

## 2023-07-28 NOTE — Progress Notes (Signed)
Cardiology Office Note:  .   Date:  07/28/2023  ID:  Ricardo Yang, DOB [birth year], MRN [id number] PCP: Jerrol Banana, MD   HeartCare Providers Cardiologist:  Debbe Odea, MD    History of Present Illness: .   Ricardo Yang is a 47 y.o. 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  He had previously been followed by [facility] cardiology from the cardiac perspective.  Left heart cath completed in 2022 revealed a patent proximal LAD stent, 50% mid RCA, distal occlusion of the small OM 2.  Echocardiogram completed 09/2022 revealed an EF of less than 20%.  He was last seen in clinic 01/26/23 by Dr.Agbor-Etang.  At that time he was establishing care.  He had recently followed up with his PCP and was noted to be dizzy with lisinopril with reduced to 2.5 mg daily.  Denied any other symptoms.  He was continued on his current medication regimen without changes made and referred to heart failure clinic.  Up titration of his GDMT was limited because of dizziness.  Continues to follow with advanced heart failure.  He returns to clinic today stating that overall he has been doing well.  He denies any chest pain, shortness of breath, peripheral edema, early satiety, or weight gain.  He does state he had some dietary indiscretions with eating barbecue which is raised his blood pressure today.  He also has completed lower back pain and that he had pulled a muscle in his back when he was getting out of the bed.  States that he has been compliant with his current medication regimen without adverse side effects.  Denies any recent hospitalizations or visits to the emergency department.  ROS: 10 point review of systems has been reviewed and considered negative with exception was been listed in  the HPI  Studies Reviewed: Marland Kitchen   EKG Interpretation Date/Time:  Tuesday July 28 2023 13:22:47 EDT Ventricular Rate:  75 PR Interval:  186 QRS Duration:  78 QT Interval:  380 QTC Calculation: 424 R Axis:   -39  Text Interpretation: Normal sinus rhythm Left axis deviation When compared with ECG of 07-Oct-2022 21:00, Confirmed by Charlsie Quest ([id number]) on 07/28/2023 1:29:28 PM   TEE 10/08/2022 1. Left ventricular ejection fraction, by estimation, is 20 to 25%. The  left ventricle has severely decreased function. The left ventricle  demonstrates global hypokinesis. The left ventricular internal cavity size  was mildly to moderately dilated.   2. Right ventricular systolic function is severely reduced. The right  ventricular size is normal.   3. No left atrial/left atrial appendage thrombus was detected. The LAA  emptying velocity was 32 cm/s.   4. The mitral valve is normal in structure. Mild mitral valve  regurgitation.   5. The aortic valve is tricuspid. Aortic valve regurgitation is trivial.   6. Agitated saline contrast bubble study was negative, with no evidence  of any interatrial shunt.   TTE 10/05/2022 1. Left ventricular ejection fraction, by estimation, is <20%. The left  ventricle has severely decreased function. The left ventricle demonstrates  global hypokinesis. The left ventricular internal cavity size was severely  dilated. Left ventricular  diastolic function could not be evaluated.   2. Right ventricular systolic function is mildly reduced. The right  ventricular size is mildly  enlarged. Mildly increased right ventricular  wall thickness. There is normal pulmonary artery systolic pressure.   3. Left atrial size was mildly dilated.   4. Right atrial size was mildly dilated.   5. The mitral valve is normal in structure. Mild mitral valve  regurgitation.   6. The aortic valve is normal in structure. Aortic valve regurgitation is  trivial.   LHC 04/22/2021 Mid  RCA lesion is 50% stenosed. 2nd Mrg lesion is 100% stenosed. Ost LAD to Prox LAD lesion is 20% stenosed.   1.  Non-ST elevation myocardial infarction 2.  Patent stent proximal LAD 3.  Distal occlusion small to moderate caliber OM 2 4.  Mild to moderate reduced left ventricular function with apical dyskinesis   Recommendations   1.  Dual antiplatelet therapy 2.  Continue metoprolol to tartrate and lisinopril 3.  Continue high intensity atorvastatin 4.  May discharge home later today, follow-up in 1 to 2 weeks   Risk Assessment/Calculations:     HYPERTENSION CONTROL Vitals:   07/28/23 1316 07/28/23 1335  BP: (!) 152/102 (!) 142/80    The patient's blood pressure is elevated above target today.  In order to address the patient's elevated BP: Blood pressure will be monitored at home to determine if medication changes need to be made.          Physical Exam:   VS:  BP (!) 142/80 (BP Location: Left Arm, Patient Position: Sitting, Cuff Size: Normal)   Pulse 75   Ht 6\' 4"  (1.93 m)   Wt 280 lb 6.4 oz (127.2 kg)   SpO2 98%   BMI 34.13 kg/m    Wt Readings from Last 3 Encounters:  07/28/23 280 lb 6.4 oz (127.2 kg)  06/25/23 286 lb (129.7 kg)  04/20/23 278 lb 9.6 oz (126.4 kg)    GEN: Well nourished, well developed in no acute distress NECK: No JVD; No carotid bruits CARDIAC: RRR, no murmurs, rubs, gallops RESPIRATORY:  Clear to auscultation without rales, wheezing or rhonchi  ABDOMEN: Soft, non-tender, non-distended EXTREMITIES:  No edema; No deformity   ASSESSMENT AND PLAN: .   Coronary artery disease status post PCI to the mid LAD in 2020.  EKG today reveals a sinus rhythm with left axis deviation without any ischemic changes noted.  He denies any angina or anginal equivalents.  He has been continued on aspirin 81 mg daily, clopidogrel 75 mg daily, and atorvastatin 80 mg daily.  HFrEF/ischemic cardiomyopathy with LVEF 20-25%.  He appears euvolemic on exam today.  He is down  6 pounds in the last month.  Continued on GDMT with Farxiga 10 mg, Toprol-XL 25 mg, valsartan 40 mg.  His labs have not been scheduled by advanced heart failure that he has having drawn today.  He has been encouraged to continue to keep his appointments with advanced heart failure clinic.  History of CVA with continued right arm weakness.  He is continued on aspirin, clopidogrel, and Lipitor.  Continues to be followed by neurology.  Hypertension with blood pressure 152/102 recheck of 142/80.  Patient has had dietary indiscretions with eating barbecue but typically blood pressures running around 130s at home.  He is continued on valsartan, metoprolol succinate.  He is encouraged to continue to monitor his blood pressure at home if possible 1 to 2 hours postmedication administration.  Tobacco abuse with total cessation is recommended.  His frequent marijuana use with cessation recommended.       Dispo: Patient to return to clinic to  see MD/APP in 6 months or sooner if needed  Signed, Lilana Blasko, NP

## 2023-07-29 ENCOUNTER — Ambulatory Visit: Payer: Medicaid Other | Admitting: Physical Therapy

## 2023-08-03 ENCOUNTER — Ambulatory Visit: Payer: Medicaid Other | Admitting: Physical Therapy

## 2023-08-03 ENCOUNTER — Other Ambulatory Visit: Payer: Self-pay

## 2023-08-04 ENCOUNTER — Ambulatory Visit: Payer: Medicaid Other | Admitting: Physical Therapy

## 2023-08-05 ENCOUNTER — Ambulatory Visit: Payer: Medicaid Other | Admitting: Physical Therapy

## 2023-08-10 ENCOUNTER — Ambulatory Visit: Payer: Medicaid Other | Admitting: Physical Therapy

## 2023-08-11 ENCOUNTER — Ambulatory Visit: Payer: Medicaid Other | Admitting: Physical Therapy

## 2023-08-12 ENCOUNTER — Ambulatory Visit: Payer: Medicaid Other | Admitting: Physical Therapy

## 2023-08-13 ENCOUNTER — Other Ambulatory Visit: Payer: Self-pay

## 2023-08-17 ENCOUNTER — Ambulatory Visit: Payer: Medicaid Other | Admitting: Physical Therapy

## 2023-08-18 ENCOUNTER — Ambulatory Visit: Payer: Medicaid Other | Admitting: Physical Therapy

## 2023-08-19 ENCOUNTER — Ambulatory Visit: Payer: Medicaid Other | Admitting: Physical Therapy

## 2023-09-01 ENCOUNTER — Other Ambulatory Visit: Payer: Self-pay

## 2023-10-07 ENCOUNTER — Other Ambulatory Visit: Payer: Self-pay

## 2023-10-07 ENCOUNTER — Ambulatory Visit (INDEPENDENT_AMBULATORY_CARE_PROVIDER_SITE_OTHER): Payer: Medicaid Other | Admitting: Family Medicine

## 2023-10-07 ENCOUNTER — Encounter: Payer: Self-pay | Admitting: Family Medicine

## 2023-10-07 VITALS — BP 130/100 | HR 81 | Ht 76.0 in | Wt 278.8 lb

## 2023-10-07 DIAGNOSIS — Z0000 Encounter for general adult medical examination without abnormal findings: Secondary | ICD-10-CM | POA: Diagnosis not present

## 2023-10-07 DIAGNOSIS — I6930 Unspecified sequelae of cerebral infarction: Secondary | ICD-10-CM

## 2023-10-07 DIAGNOSIS — Z1159 Encounter for screening for other viral diseases: Secondary | ICD-10-CM

## 2023-10-07 DIAGNOSIS — I2510 Atherosclerotic heart disease of native coronary artery without angina pectoris: Secondary | ICD-10-CM

## 2023-10-07 DIAGNOSIS — G4730 Sleep apnea, unspecified: Secondary | ICD-10-CM

## 2023-10-07 DIAGNOSIS — Z114 Encounter for screening for human immunodeficiency virus [HIV]: Secondary | ICD-10-CM

## 2023-10-07 DIAGNOSIS — Z23 Encounter for immunization: Secondary | ICD-10-CM

## 2023-10-07 DIAGNOSIS — Z9861 Coronary angioplasty status: Secondary | ICD-10-CM

## 2023-10-07 DIAGNOSIS — Z125 Encounter for screening for malignant neoplasm of prostate: Secondary | ICD-10-CM

## 2023-10-07 DIAGNOSIS — Z131 Encounter for screening for diabetes mellitus: Secondary | ICD-10-CM

## 2023-10-07 DIAGNOSIS — I10 Essential (primary) hypertension: Secondary | ICD-10-CM

## 2023-10-07 DIAGNOSIS — E782 Mixed hyperlipidemia: Secondary | ICD-10-CM

## 2023-10-07 HISTORY — DX: Encounter for general adult medical examination without abnormal findings: Z00.00

## 2023-10-07 NOTE — Assessment & Plan Note (Signed)
Sleep Apnea Patient denies symptoms but has not undergone formal evaluation following prior referral. Discussed the potential systemic impacts of untreated sleep apnea including hypertension and stroke risk. -Refer to sleep medicine for evaluation and management.

## 2023-10-07 NOTE — Patient Instructions (Addendum)
-   Obtain fasting labs with orders provided (can have water or black coffee but otherwise no food or drink x 8 hours before labs) - Review information provided - Attend eye doctor annually, dentist every 6 months, work towards or maintain 30 minutes of moderate intensity physical activity at least 5 days per week, and consume a balanced diet - Return in 1 year for physical - Contact us for any questions between now and then  INSTRUCTIONS:  - Follow up with sleep medicine for a sleep study evaluation. - Continue taking medications as prescribed. - Maintain follow-up with cardiology and neurology - Schedule evaluation of shoulder and knee pain next week. - Complete routine labs today. - Continue home exercises until restarting PT/OT

## 2023-10-07 NOTE — Assessment & Plan Note (Signed)
Hypertension Blood pressure elevated at current visit, medication compliance due to external circumstantial events (incarceration) noted. Discussed potential benefits of treating sleep apnea on blood pressure control. -Continue Metoprolol and Lisinopril as prescribed. -Maintain follow-up with cardiology in April.

## 2023-10-07 NOTE — Assessment & Plan Note (Signed)
Annual examination completed, risk stratification labs ordered, anticipatory guidance provided.  We will follow labs once resulted. 

## 2023-10-07 NOTE — Assessment & Plan Note (Signed)
Stroke History of stroke with ongoing anticoagulation therapy. Discussed the importance of consistent medication adherence to prevent future events. -Continue Plavix and Aspirin as prescribed. -Maintain follow-up with neurology and cardiology

## 2023-10-07 NOTE — Progress Notes (Signed)
Annual Physical Exam Visit  Patient Information:  Patient ID: Ricardo Yang, male DOB: Feb 12, 1976 Age: 47 y.o. MRN: [id number]   Subjective:   CC: Annual Physical Exam  HPI:  Ricardo Yang is here for their annual physical.  I reviewed the past medical history, family history, social history, surgical history, and allergies today and changes were made as necessary.  Please see the problem list section below for additional details.  Past Medical History: Past Medical History:  Diagnosis Date   Acute CVA (cerebrovascular accident) (HCC) 10/04/2022   Acute ST elevation myocardial infarction (STEMI) involving left anterior descending (LAD) coronary artery (HCC) 08/06/2019   Anxiety 09/2022   After stroke   CAD (coronary artery disease) 10/04/2022   Cerebrovascular accident (CVA) (HCC) 10/07/2022   Chest pain    CHF (congestive heart failure) (HCC)    Cocaine abuse (HCC)    Cocaine use 10/06/2022   Depression December 2023   When stroke happened   Elevated troponin    Encounter for imaging to screen for metal prior to MRI 10/07/2022   Healthcare maintenance 10/07/2023   Heavy smoker    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 question possible MS   NSTEMI (non-ST elevated myocardial infarction) (HCC) 05/25/2015   Obesity    Polysubstance abuse (HCC)    a. tobacco, cocaine, crack, ecstasy, pills, "anything but injections"   Primary hypertension 10/05/2022   Sleep apnea    Always   STEMI (ST elevation myocardial infarction) (HCC) 08/06/2019   Past Surgical History: Past Surgical History:  Procedure Laterality Date   CARDIAC CATHETERIZATION N/A 05/28/2015   Procedure: Left Heart Cath and Coronary Angiography;  Surgeon: Antonieta Iba, MD;  Location: [facility] INVASIVE CV LAB;  Service: Cardiovascular;  Laterality: N/A;   CORONARY ANGIOGRAPHY N/A 04/22/2021   Procedure: CORONARY ANGIOGRAPHY;  Surgeon: Marcina Millard, MD;   Location: [facility] INVASIVE CV [facility];  Service: Cardiovascular;  Laterality: N/A;   CORONARY/GRAFT ACUTE MI REVASCULARIZATION N/A 08/06/2019   Procedure: Coronary/Graft Acute MI Revascularization;  Surgeon: Marcina Millard, MD;  Location: [facility] INVAS[facility] CV [facility];  Service: Cardiovascular;  Laterality: N/A;   LEFT HEART CATH N/A 04/22/2021   Procedure: Left Heart Cath;  Surgeon: Marcina Millard, MD;  Location: [facility] INVAS[facility] CV [facility];  Service: Cardiovascular;  Laterality: N/A;   LEFT HEART CATH AND CORONARY ANGIOGRAPHY N/A 08/06/2019   Procedure: LEFT HEART CATH AND CORONARY ANGIOGRAPHY;  Surgeon: Marcina Millard, MD;  Location: [facility] INVAS[facility] CV [facility];  Service: Cardiovascular;  Laterality: N/A;   TEE WITHOUT CARDIOVERSION N/A 10/08/2022   Procedure: TRANSESOPHAGEAL ECHOCARDIOGRAM (TEE);  Surgeon: Debbe Odea, MD;  Location: [facility] ORS;  Service: Cardiovascular;  Laterality: N/A;  11:30am   Family History: Family History  Problem Relation Age of Onset   Miscarriages / Stillbirths Mother    Heart disease Father    Hypertension Father    Depression Daughter    Alcohol abuse Maternal Grandfather    Cancer Maternal Grandfather    Depression Maternal Grandfather    Hypertension Maternal Grandfather    Stroke Maternal Grandfather    Cancer Maternal Grandmother    Hypertension Maternal Grandmother    Allergies: No Known Allergies Health Maintenance: Health Maintenance  Topic Date Due   Hepatitis C Screening  Never done   Colonoscopy  Never done   DTaP/Tdap/Td (2 - Td or Tdap) 10/06/2033   HIV Screening  Completed   HPV VACCINES  Aged Out  INFLUENZA VACCINE  Discontinued   COVID-19 Vaccine  Discontinued    HM Colonoscopy          Overdue - Colonoscopy (Every 10 Years) Never done    No completion history exists for this topic.           Medications: Current Outpatient Medications on File Prior to Visit  Medication Sig Dispense Refill   aspirin 81 MG chewable tablet  Chew 1 tablet (81 mg total) by mouth daily. 30 tablet 5   atorvastatin (LIPITOR) 80 MG tablet Take 1 tablet (80 mg total) by mouth daily at 6 PM. 30 tablet 5   clopidogrel (PLAVIX) 75 MG tablet Take 1 tablet (75 mg total) by mouth daily with breakfast. 30 tablet 5   dapagliflozin propanediol (FARXIGA) 10 MG TABS tablet Take 1 tablet (10 mg total) by mouth daily. 30 tablet 5   metoprolol succinate (TOPROL-XL) 25 MG 24 hr tablet Take 1 tablet (25 mg total) by mouth daily. Dose change 30 tablet 5   valsartan (DIOVAN) 40 MG tablet Take 1 tablet (40 mg total) by mouth daily. 30 tablet 5   baclofen (LIORESAL) 10 MG tablet Take 10 mg by mouth at bedtime as needed for muscle spasms. (Patient not taking: Reported on 10/07/2023)     No current facility-administered medications on file prior to visit.    Objective:   Vitals:   10/07/23 1053  BP: (!) 130/100  Pulse: 81  SpO2: 98%   Vitals:   10/07/23 1053  Weight: 278 lb 12.8 oz (126.5 kg)  Height: 6\' 4"  (1.93 m)   Body mass index is 33.94 kg/m.  General: Well Developed, well nourished, and in no acute distress.  Neuro: Alert and oriented x3, extra-ocular muscles intact, sensation grossly intact. Cranial nerves II through XII are grossly intact, motor, sensory, and coordinative functions are intact. HEENT: Normocephalic, atraumatic, neck supple, no masses, no lymphadenopathy, thyroid nonenlarged. Oropharynx, nasopharynx, external ear canals are unremarkable. Skin: Warm and dry, no rashes noted.  Cardiac: Regular rate and rhythm. No peripheral edema. Pulses symmetric. Respiratory: Clear to auscultation bilaterally. Speaking in full sentences.  Abdominal: Soft, nontender, nondistended, positive bowel sounds, no masses, no organomegaly. Musculoskeletal: Stable, right upper extremity hemiplegia   Impression and Recommendations:   The patient was counselled, risk factors were discussed, and anticipatory guidance given.  Problem List Items  Addressed This Visit       Cardiovascular and Mediastinum   CAD S/P percutaneous coronary angioplasty   Relevant Orders   CBC   Comprehensive metabolic panel   Lipid panel   TSH   Ambulatory referral to Pulmonology   Essential hypertension    Hypertension Blood pressure elevated at current visit, medication compliance due to external circumstantial events (incarceration) noted. Discussed potential benefits of treating sleep apnea on blood pressure control. -Continue Metoprolol and Lisinopril as prescribed. -Maintain follow-up with cardiology in April.      Relevant Orders   CBC   Comprehensive metabolic panel   Lipid panel   TSH     Respiratory   Sleep apnea    Sleep Apnea Patient denies symptoms but has not undergone formal evaluation following prior referral. Discussed the potential systemic impacts of untreated sleep apnea including hypertension and stroke risk. -Refer to sleep medicine for evaluation and management.      Relevant Orders   Ambulatory referral to Pulmonology     Other   Healthcare maintenance - Primary    Annual examination completed, risk stratification labs ordered,  anticipatory guidance provided.  We will follow labs once resulted.      History of CVA with residual deficit    Stroke History of stroke with ongoing anticoagulation therapy. Discussed the importance of consistent medication adherence to prevent future events. -Continue Plavix and Aspirin as prescribed. -Maintain follow-up with neurology and cardiology      Relevant Orders   CBC   Comprehensive metabolic panel   Lipid panel   TSH   Ambulatory referral to Pulmonology   Mixed hyperlipidemia   Relevant Orders   Comprehensive metabolic panel   Lipid panel   Other Visit Diagnoses     Need for vaccination       Relevant Orders   Tdap vaccine greater than or equal to 7yo IM (Completed)   Screening for HIV (human immunodeficiency virus)       Relevant Orders   HIV Antibody  (routine testing w rflx)   Need for hepatitis C screening test       Relevant Orders   Hepatitis C antibody   Screening for diabetes mellitus       Relevant Orders   Hemoglobin A1c   Screening for prostate cancer       Relevant Orders   PSA Total (Reflex To Free)        Orders & Medications Medications: No orders of the defined types were placed in this encounter.  Orders Placed This Encounter  Procedures   Tdap vaccine greater than or equal to 7yo IM   CBC   Comprehensive metabolic panel   Hemoglobin A1c   Hepatitis C antibody   HIV Antibody (routine testing w rflx)   Lipid panel   PSA Total (Reflex To Free)   TSH   Ambulatory referral to Pulmonology     No follow-ups on file.    Jerrol Banana, MD, [facility]   Primary Care Sports Medicine Primary Care and Sports Medicine at [facility]

## 2023-10-08 LAB — COMPREHENSIVE METABOLIC PANEL
ALT: 14 [IU]/L (ref 0–44)
AST: 13 [IU]/L (ref 0–40)
Albumin: 4 g/dL — ABNORMAL LOW (ref 4.1–5.1)
Alkaline Phosphatase: 63 [IU]/L (ref 44–121)
BUN/Creatinine Ratio: 11 (ref 9–20)
BUN: 9 mg/dL (ref 6–24)
Bilirubin Total: 0.4 mg/dL (ref 0.0–1.2)
CO2: 24 mmol/L (ref 20–29)
Calcium: 9.4 mg/dL (ref 8.7–10.2)
Chloride: 104 mmol/L (ref 96–106)
Creatinine, Ser: 0.84 mg/dL (ref 0.76–1.27)
Globulin, Total: 2.9 g/dL (ref 1.5–4.5)
Glucose: 95 mg/dL (ref 70–99)
Potassium: 4.4 mmol/L (ref 3.5–5.2)
Sodium: 140 mmol/L (ref 134–144)
Total Protein: 6.9 g/dL (ref 6.0–8.5)
eGFR: 107 mL/min/{1.73_m2} (ref >59)

## 2023-10-08 LAB — LIPID PANEL
Chol/HDL Ratio: 3.8 {ratio} (ref 0.0–5.0)
Cholesterol, Total: 179 mg/dL (ref 100–199)
HDL: 47 mg/dL (ref >39)
LDL Chol Calc (NIH): 114 mg/dL — ABNORMAL HIGH (ref 0–99)
Triglycerides: 96 mg/dL (ref 0–149)
VLDL Cholesterol Cal: 18 mg/dL (ref 5–40)

## 2023-10-08 LAB — CBC
Hematocrit: 47.7 % (ref 37.5–51.0)
Hemoglobin: 15.6 g/dL (ref 13.0–17.7)
MCH: 28.5 pg (ref 26.6–33.0)
MCHC: 32.7 g/dL (ref 31.5–35.7)
MCV: 87 fL (ref 79–97)
Platelets: 229 10*3/uL (ref 150–450)
RBC: 5.47 x10E6/uL (ref 4.14–5.80)
RDW: 13.6 % (ref 11.6–15.4)
WBC: 9.2 10*3/uL (ref 3.4–10.8)

## 2023-10-08 LAB — HIV ANTIBODY (ROUTINE TESTING W REFLEX): HIV Screen 4th Generation wRfx: NONREACTIVE

## 2023-10-08 LAB — HEPATITIS C ANTIBODY: Hep C Virus Ab: NONREACTIVE

## 2023-10-08 LAB — PSA TOTAL (REFLEX TO FREE): Prostate Specific Ag, Serum: 0.6 ng/mL (ref 0.0–4.0)

## 2023-10-08 LAB — HEMOGLOBIN A1C
Est. average glucose Bld gHb Est-mCnc: 120 mg/dL
Hgb A1c MFr Bld: 5.8 % — ABNORMAL HIGH (ref 4.8–5.6)

## 2023-10-08 LAB — TSH: TSH: 0.933 u[IU]/mL (ref 0.450–4.500)

## 2023-10-13 ENCOUNTER — Ambulatory Visit: Payer: Medicaid Other | Admitting: Family Medicine

## 2023-11-02 ENCOUNTER — Ambulatory Visit: Payer: Medicaid Other | Admitting: Family Medicine

## 2023-11-03 ENCOUNTER — Other Ambulatory Visit (INDEPENDENT_AMBULATORY_CARE_PROVIDER_SITE_OTHER): Payer: Medicaid Other | Admitting: Radiology

## 2023-11-03 ENCOUNTER — Other Ambulatory Visit: Payer: Self-pay

## 2023-11-03 ENCOUNTER — Encounter: Payer: Self-pay | Admitting: Family Medicine

## 2023-11-03 ENCOUNTER — Other Ambulatory Visit: Payer: Self-pay | Admitting: Family

## 2023-11-03 ENCOUNTER — Ambulatory Visit: Payer: Medicaid Other | Admitting: Family Medicine

## 2023-11-03 VITALS — BP 118/78 | HR 83 | Ht 76.0 in | Wt 274.2 lb

## 2023-11-03 DIAGNOSIS — M25561 Pain in right knee: Secondary | ICD-10-CM | POA: Diagnosis not present

## 2023-11-03 DIAGNOSIS — I10 Essential (primary) hypertension: Secondary | ICD-10-CM

## 2023-11-03 DIAGNOSIS — M7521 Bicipital tendinitis, right shoulder: Secondary | ICD-10-CM | POA: Diagnosis not present

## 2023-11-03 DIAGNOSIS — I6930 Unspecified sequelae of cerebral infarction: Secondary | ICD-10-CM

## 2023-11-03 MED ORDER — TRIAMCINOLONE ACETONIDE 40 MG/ML IJ SUSP
40.0000 mg | Freq: Once | INTRAMUSCULAR | Status: AC
  Administered 2023-11-03: 40 mg via INTRA_ARTICULAR

## 2023-11-03 MED FILL — Dapagliflozin Propanediol Tab 10 MG (Base Equivalent): ORAL | 90 days supply | Qty: 90 | Fill #0 | Status: AC

## 2023-11-03 NOTE — Assessment & Plan Note (Signed)
 The patient, with a history of stroke, presents with decreased mobility in the right shoulder currently due to pain coupled with stroke sequela. The patient reports a progressive decrease in the range of motion, with pain upon abduction beyond ninety degrees.  Pain localizes to anterior shoulder without radiation.  Physical Exam MUSCULOSKELETAL: Active range of motion of the right shoulder limited to approximately ninety degrees abduction with pain. Decreased sensation in the right shoulder, described as numb but not painful. Speed's test positive, Neer's test localizes anteriorly, equivocal Hawkins and [place].  Right shoulder biceps tendinitis Previous injection in subacromial space on 01/01/2023 with reported benefit. -Patient elected to proceed with ultrasound-guided biceps tendon sheath cortisone injection today. -Referral for home health physical therapy and occupational therapy.

## 2023-11-03 NOTE — Progress Notes (Signed)
 Primary Care / Sports Medicine Office Visit  Patient Information:  Patient ID: Ricardo Yang, male DOB: 1976-05-19 Age: 48 y.o. MRN: [id number]   Ricardo Yang is a pleasant 47 y.o. male presenting with the following:  Chief Complaint  Patient presents with   Shoulder Pain    Right shoulder pain x 6 months, patient is looking to get injection in right shoulder.   Knee Pain    Pain in right knee x 6 months, patient looking to get injection for knee pain and weakness. Patient knee gives way when going from sit to stand occasionally.      Vitals:   11/03/23 1410  BP: 118/78  Pulse: 83  SpO2: 98%   Vitals:   11/03/23 1410  Weight: 274 lb 3.2 oz (124.4 kg)  Height: 6' 4 (1.93 m)   Body mass index is 33.38 kg/m.  No results found.   Independent interpretation of notes and tests performed by another provider:   None  Procedures performed:   Procedure:  Injection of right shoulder under ultrasound guidance. Ultrasound guidance utilized for out of plane approach to right bicipital groove, no tenderness sonographic abnormality noted Samsung HS60 device utilized with permanent recording / reporting. Verbal informed consent obtained and verified. Skin prepped in a sterile fashion. Ethyl chloride for topical local analgesia.  Completed without difficulty and tolerated well. Medication: triamcinolone  acetonide 40 mg/mL suspension for injection 1 mL total and 2 mL lidocaine  1% without epinephrine utilized for needle placement anesthetic Advised to contact for fevers/chills, erythema, induration, drainage, or persistent bleeding.  Procedure:  Injection of right knee under ultrasound guidance. Ultrasound guidance utilized for out of plane anteromedial approach, no effusion noted Samsung HS60 device utilized with permanent recording / reporting. Verbal informed consent obtained and verified. Skin prepped in a sterile fashion. Ethyl chloride for topical local analgesia.   Completed without difficulty and tolerated well. Medication: triamcinolone  acetonide 40 mg/mL suspension for injection 1 mL total and 2 mL lidocaine  1% without epinephrine utilized for needle placement anesthetic Advised to contact for fevers/chills, erythema, induration, drainage, or persistent bleeding.   Pertinent History, Exam, Impression, and Recommendations:   Problem List Items Addressed This Visit     Arthralgia of right knee   Reports pain in the knee, primarily in the back and occasionally on the inside. The patient describes the pain as similar to gravel and notes that it intensifies with deep flexion.  Exam demonstrates Lateral joint line tenderness and patellofemoral pain with deep flexion noted in the knee.  No ligamentous laxity with anterior/posterior drawer valgus/varus stressing.  Right knee arthralgia Pain primarily localized to the patellofemoral and lateral tibiofemoral articulations, no ligamentous involvement noted. -Patient elected to proceed with ultrasound-guided intra-articular corticosteroid injection to the right knee today. -Referral for home health physical therapy and occupational therapy. -Patient to contact us  for any instability issues that persist despite pain resolution.      Relevant Orders   US  LIMITED JOINT SPACE STRUCTURES LOW RIGHT   Ambulatory referral to Home Health   Biceps tendinitis, right - Primary   The patient, with a history of stroke, presents with decreased mobility in the right shoulder currently due to pain coupled with stroke sequela. The patient reports a progressive decrease in the range of motion, with pain upon abduction beyond ninety degrees.  Pain localizes to anterior shoulder without radiation.  Physical Exam MUSCULOSKELETAL: Active range of motion of the right shoulder limited to approximately ninety degrees abduction with  pain. Decreased sensation in the right shoulder, described as numb but not painful. Speed's test  positive, Neer's test localizes anteriorly, equivocal Hawkins and [place].  Right shoulder biceps tendinitis Previous injection in subacromial space on 01/01/2023 with reported benefit. -Patient elected to proceed with ultrasound-guided biceps tendon sheath cortisone injection today. -Referral for home health physical therapy and occupational therapy.      Relevant Orders   US  LIMITED JOINT SPACE STRUCTURES UP RIGHT   Ambulatory referral to Home Health   History of CVA with residual deficit   History of CVA with residual deficit -Recommend follow-up with neurology for potential restart of stroke rehabilitation.  (Contact information provided)      Relevant Orders   Ambulatory referral to Home Health     Orders & Medications Medications: No orders of the defined types were placed in this encounter.  Orders Placed This Encounter  Procedures   US  LIMITED JOINT SPACE STRUCTURES UP RIGHT   US  LIMITED JOINT SPACE STRUCTURES LOW RIGHT   Ambulatory referral to Home Health     No follow-ups on file.     Selinda JINNY Ku, MD, [facility]   Primary Care Sports Medicine Primary Care and Sports Medicine at [facility]

## 2023-11-03 NOTE — Patient Instructions (Addendum)
 You have just been given a cortisone injection to reduce pain and inflammation. After the injection you may notice immediate relief of pain as a result of the Lidocaine . It is important to rest the area of the injection for 24 to 48 hours after the injection. There is a possibility of some temporary increased discomfort and swelling for up to 72 hours until the cortisone begins to work. If you do have pain, simply rest the joint and use ice. If you can tolerate over the counter medications, you can try Tylenol , Aleve , or Advil  for added relief per package instructions.  - An injection was administered today to reduce inflammation and alleviate pain. - You are referred to home health physical therapy and occupational therapy  Post-Stroke Rehabilitation: - We recommend a follow-up with neurology to explore further rehabilitation options, including the possibility of Botox treatment to reduce muscle spasticity. - You are referred to home health physical therapy and occupational therapy to aid in improving your arm and hand function.  Please follow through with the referrals and recommended therapies, and contact us  if you have any questions or concerns regarding your treatment plan. We will continue to monitor your progress closely.

## 2023-11-03 NOTE — Assessment & Plan Note (Signed)
 Reports pain in the knee, primarily in the back and occasionally on the inside. The patient describes the pain as similar to gravel and notes that it intensifies with deep flexion.  Exam demonstrates Lateral joint line tenderness and patellofemoral pain with deep flexion noted in the knee.  No ligamentous laxity with anterior/posterior drawer valgus/varus stressing.  Right knee arthralgia Pain primarily localized to the patellofemoral and lateral tibiofemoral articulations, no ligamentous involvement noted. -Patient elected to proceed with ultrasound-guided intra-articular corticosteroid injection to the right knee today. -Referral for home health physical therapy and occupational therapy. -Patient to contact us  for any instability issues that persist despite pain resolution.

## 2023-11-03 NOTE — Assessment & Plan Note (Addendum)
 History of CVA with residual deficit -Recommend follow-up with neurology for potential restart of stroke rehabilitation.  (Contact information provided)

## 2023-11-13 ENCOUNTER — Telehealth: Payer: Self-pay | Admitting: Family Medicine

## 2023-11-13 NOTE — Telephone Encounter (Signed)
Home Health Verbal Orders - Caller/Agency: Darral Dash with [facility] Callback Number: [phone] Service Requested: Occupational Therapy Frequency: 1 x a week for 1 week and 2 x a wk for 3 weeks Any new concerns about the patient? No   Please assist patient further and if possible if she can get the orders by sometime on Monday she would appreciate it

## 2023-11-16 ENCOUNTER — Other Ambulatory Visit: Payer: Self-pay

## 2023-11-16 NOTE — Telephone Encounter (Signed)
Please review and advise.    Thank you.

## 2023-11-16 NOTE — Telephone Encounter (Signed)
Gave verbal orders to Ester with [facility]. JM

## 2023-11-25 ENCOUNTER — Ambulatory Visit: Payer: Medicaid Other | Admitting: Neurology

## 2023-12-04 ENCOUNTER — Telehealth: Payer: Self-pay | Admitting: Family Medicine

## 2023-12-04 NOTE — Telephone Encounter (Signed)
 Home Health Verbal Orders - Caller/Agency: Ester with [facility] Callback Number: [phone] Service Requested: Occupational Therapy Frequency: 2x3 1x1 Any new concerns about the patient? No  Please leave voice mail for verbal orders for Ester.

## 2023-12-07 NOTE — Telephone Encounter (Signed)
 Spoke with Ricardo Yang and gave verbal orders for OT 2x3 and 1x1.

## 2023-12-25 ENCOUNTER — Telehealth: Payer: Self-pay | Admitting: Family Medicine

## 2023-12-25 ENCOUNTER — Telehealth: Payer: Self-pay

## 2023-12-25 NOTE — Telephone Encounter (Signed)
Called left VM giving verbal orders.  KP 

## 2023-12-25 NOTE — Telephone Encounter (Signed)
 Called gave verbal orders.   KP Copied from CRM [id number]. Topic: General - Other >> Dec 25, 2023  2:17 PM Adrianna P wrote: Reason for CRM: Darral Dash from the home health agency [facility] was returning a missed phone call.

## 2023-12-25 NOTE — Telephone Encounter (Signed)
 Copied from CRM [id number]. Topic: Clinical - Home Health Verbal Orders >> Dec 25, 2023 12:21 PM Shelah Lewandowsky wrote: Caller/Agency: Darral Dash with [facility] Callback Number: [phone] Service Requested: Occupational Therapy Frequency: need an ok for patient to do to outpatient Occupational therapy at the hospital Any new concerns about the patient? No longer home bound

## 2023-12-31 NOTE — Telephone Encounter (Signed)
 FYI  Pt missed appt.  KP  Copied from CRM [id number]. Topic: Clinical - Home Health Verbal Orders >> Dec 30, 2023  4:02 PM Everette C wrote: Caller/Agency: Karoline Caldwell / [facility]  Callback Number: [phone] Service Requested: Physical Therapy  Patient has missed a visit scheduled for today 3/5/254

## 2024-01-26 ENCOUNTER — Ambulatory Visit: Payer: Medicaid Other | Attending: Cardiology | Admitting: Cardiology

## 2024-01-26 NOTE — Progress Notes (Deleted)
 Cardiology Office Note:  .   Date:  01/26/2024  ID:  Ricardo Yang, DOB [birth year], MRN [id number] PCP: Jerrol Banana, MD  [facility] Providers Cardiologist:  Debbe Odea, MD { Click to update primary MD,subspecialty MD or APP then REFRESH:1}   History of Present Illness: .   Ricardo Yang is a 48 y.o. male with a past medical history of CAD/anterior STEMI status post PCI/DES to the LAD (07/2019), ischemic cardiomyopathy with EF less than 20%, current smoker x 25+ years, previous cocaine use, CVA (09/2022), resulting in right arm weakness, who presents today for follow-up of his coronary artery disease.   He had previously been followed by [facility] cardiology from the cardiac perspective.  Left heart cath completed in 2022 revealed a patent proximal LAD stent, 50% mid RCA, distal occlusion of the small OM 2.  Echocardiogram completed 09/2022 revealed an EF of less than 20%.  Echocardiogram completed 09/2022 revealed LVEF less than 20%, severely decreased function, left ventricle demonstrated global hypokinesis.  Right ventricular systolic function was mildly reduced and mildly enlarged.  There is mild mitral regurgitation and trivial aortic regurgitation.  Additional TEE revealed an LVEF of 20-25%.   He was last seen in clinic 01/26/23 by Dr.Agbor-Etang.  At that time he was establishing care.  He had recently followed up with his PCP and was noted to be dizzy with lisinopril with reduced to 2.5 mg daily.  Denied any other symptoms.  He was continued on his current medication regimen without changes made and referred to heart failure clinic.  Up titration of his GDMT was limited because of dizziness.   He was last seen in clinic 07/28/2023 stating that he overall had been doing well from the cardiac perspective without symptoms of angina or anginal equivalents.  He stated he had been compliant with his current medication regimen and has not required any hospitalizations or visits to the  emergency department.  There were no medication changes that were made.  He was continued on GDMT.  He was sent for updated labs and encouraged to keep his follow-up appointments with advanced heart failure.  He returns to clinic today  ROS: 10 point review of system has been reviewed and considered negative except ones been listed in the HPI  Studies Reviewed: Marland Kitchen        TEE 10/08/2022 1. Left ventricular ejection fraction, by estimation, is 20 to 25%. The  left ventricle has severely decreased function. The left ventricle  demonstrates global hypokinesis. The left ventricular internal cavity size  was mildly to moderately dilated.   2. Right ventricular systolic function is severely reduced. The right  ventricular size is normal.   3. No left atrial/left atrial appendage thrombus was detected. The LAA  emptying velocity was 32 cm/s.   4. The mitral valve is normal in structure. Mild mitral valve  regurgitation.   5. The aortic valve is tricuspid. Aortic valve regurgitation is trivial.   6. Agitated saline contrast bubble study was negative, with no evidence  of any interatrial shunt.    TTE 10/05/2022 1. Left ventricular ejection fraction, by estimation, is <20%. The left  ventricle has severely decreased function. The left ventricle demonstrates  global hypokinesis. The left ventricular internal cavity size was severely  dilated. Left ventricular  diastolic function could not be evaluated.   2. Right ventricular systolic function is mildly reduced. The right  ventricular size is mildly enlarged. Mildly increased right ventricular  wall thickness. There is  normal pulmonary artery systolic pressure.   3. Left atrial size was mildly dilated.   4. Right atrial size was mildly dilated.   5. The mitral valve is normal in structure. Mild mitral valve  regurgitation.   6. The aortic valve is normal in structure. Aortic valve regurgitation is  trivial.    LHC 04/22/2021 Mid RCA lesion  is 50% stenosed. 2nd Mrg lesion is 100% stenosed. Ost LAD to Prox LAD lesion is 20% stenosed.   1.  Non-ST elevation myocardial infarction 2.  Patent stent proximal LAD 3.  Distal occlusion small to moderate caliber OM 2 4.  Mild to moderate reduced left ventricular function with apical dyskinesis   Recommendations   1.  Dual antiplatelet therapy 2.  Continue metoprolol to tartrate and lisinopril 3.  Continue high intensity atorvastatin 4.  May discharge home later today, follow-up in 1 to 2 weeks Risk Assessment/Calculations:     No BP recorded.  {Refresh Note OR Click here to enter BP  :1}***       Physical Exam:   VS:  There were no vitals taken for this visit.   Wt Readings from Last 3 Encounters:  11/03/23 274 lb 3.2 oz (124.4 kg)  10/07/23 278 lb 12.8 oz (126.5 kg)  07/28/23 280 lb 6.4 oz (127.2 kg)    GEN: Well nourished, well developed in no acute distress NECK: No JVD; No carotid bruits CARDIAC: ***RRR, no murmurs, rubs, gallops RESPIRATORY:  Clear to auscultation without rales, wheezing or rhonchi  ABDOMEN: Soft, non-tender, non-distended EXTREMITIES:  No edema; No deformity   ASSESSMENT AND PLAN: .   Coronary artery disease status post PCI to the mid LAD in 2020 with HFrEF/ischemic cardiomyopathy with an LVEF of 20-25% History of CVA Primary hypertension Hyperlipidemia Tobacco abuse    {Are you ordering a CV Procedure (e.g. stress test, cath, DCCV, TEE, etc)?   Press F2        :[id number]}  Dispo: ***  Signed, Karsin Pesta, NP

## 2024-02-09 ENCOUNTER — Ambulatory Visit: Payer: Medicaid Other | Admitting: Neurology

## 2024-02-09 ENCOUNTER — Encounter: Payer: Self-pay | Admitting: Neurology

## 2024-03-04 ENCOUNTER — Other Ambulatory Visit: Payer: Self-pay

## 2024-03-04 ENCOUNTER — Other Ambulatory Visit: Payer: Self-pay | Admitting: Family

## 2024-03-04 DIAGNOSIS — I6930 Unspecified sequelae of cerebral infarction: Secondary | ICD-10-CM

## 2024-03-04 DIAGNOSIS — I10 Essential (primary) hypertension: Secondary | ICD-10-CM

## 2024-03-07 ENCOUNTER — Other Ambulatory Visit: Payer: Self-pay

## 2024-03-07 ENCOUNTER — Other Ambulatory Visit: Payer: Self-pay | Admitting: Family

## 2024-03-07 DIAGNOSIS — I10 Essential (primary) hypertension: Secondary | ICD-10-CM

## 2024-03-07 DIAGNOSIS — I6930 Unspecified sequelae of cerebral infarction: Secondary | ICD-10-CM

## 2024-03-07 MED FILL — Atorvastatin Calcium Tab 80 MG (Base Equivalent): ORAL | 30 days supply | Qty: 30 | Fill #0 | Status: AC

## 2024-03-07 MED FILL — Metoprolol Succinate Tab ER 24HR 25 MG (Tartrate Equiv): ORAL | 30 days supply | Qty: 30 | Fill #0 | Status: AC

## 2024-03-07 NOTE — Telephone Encounter (Signed)
 Unable to reach patient. Pt was NS for last appt, and needs a new appt scheduled.

## 2024-04-25 ENCOUNTER — Telehealth: Payer: Self-pay

## 2024-04-25 NOTE — Telephone Encounter (Signed)
 Spoke with romero she is sending over fax again. I will get medical records sent to her via fax.  JM   Copied from CRM [id number]. Topic: Medical Record Request - Other >> Apr 25, 2024  8:58 AM Mercedes MATSU wrote: Reason for CRM: Janince @ [employer]. Request was sent from Vernell Collet requesting medical records for this patient. romero can be reached at [phone].

## 2024-05-18 ENCOUNTER — Other Ambulatory Visit: Payer: Self-pay | Admitting: Family

## 2024-05-18 ENCOUNTER — Ambulatory Visit: Payer: Self-pay

## 2024-05-18 ENCOUNTER — Other Ambulatory Visit: Payer: Self-pay

## 2024-05-18 DIAGNOSIS — I10 Essential (primary) hypertension: Secondary | ICD-10-CM

## 2024-05-18 DIAGNOSIS — I2510 Atherosclerotic heart disease of native coronary artery without angina pectoris: Secondary | ICD-10-CM

## 2024-05-18 DIAGNOSIS — I6930 Unspecified sequelae of cerebral infarction: Secondary | ICD-10-CM

## 2024-05-18 NOTE — Telephone Encounter (Signed)
 FYI Only or Action Required?: FYI only for provider.  Patient was last seen in primary care on 11/03/2023 by Ricardo Selinda PARAS, MD.  Called Nurse Triage reporting Neurologic Problem.  Symptoms began several months ago.  Interventions attempted: Nothing.  Symptoms are: dysphasia, worsening right sided weakness (leg dragging), unsteady gait, dizziness, blurry vision, intermittent headaches, patient states felt like a had a stroke about 2 months ago gradually worsening.  Triage Disposition: Go to ED Now (or PCP Triage)  Patient/caregiver understands and will follow disposition?: Yes           Copied from CRM #[id number]. Topic: Clinical - Red Word Triage >> May 18, 2024 12:58 PM Charlet HERO wrote: Red Word that prompted transfer to Nurse Triage: issue with walking , being able to understand and attention span eating by himself is getting difficult, she does alot of finger foods, hard for him to form a complete sentence like cant figure out the word. Reason for Disposition  Patient sounds very sick or weak to the triager  Answer Assessment - Initial Assessment Questions Patient complaining of worsening stroke symptoms. Patient has not seen neurologist or PCP for the worsening symptoms. Patient states he thinks he may have had another stroke in the past 2 months cause all of the sudden he couldn't get words out.  Mother states she is unsure if it is a stroke, she states [name] also diagnosed the patient with MS so she is not sure if that could attributing to the symptoms. Advised to go straight to ED and then call back after to make appointment after ED evaluation.  1. SYMPTOM: What is the main symptom you are concerned about? (e.g., weakness, numbness)     Dizziness (will trip and fall), right leg will drag, dysphasia (difficulty finding the right word), difficulty concentrating.  2. ONSET: When did this start? (e.g., minutes, hours, days; while sleeping)     X 6 months,  progressing and worsening over the past couple of months.  3. LAST NORMAL: When was the last time you (the patient) were normal (no symptoms)?     December 2023 when he had stroke.  4. PATTERN Does this come and go, or has it been constant since it started?  Is it present now?     Constant.  5. CARDIAC SYMPTOMS: Have you had any of the following symptoms: chest pain, difficulty breathing, palpitations?     No.  6. NEUROLOGIC SYMPTOMS: Have you had any of the following symptoms: headache, dizziness, vision loss, double vision, changes in speech, unsteady on your feet?     Intermittent headaches, right side is weaker than it used to be, changes in speech: dysphasia, blurry vision when trying to read.  7. OTHER SYMPTOMS: Do you have any other symptoms?     No.  8. PREGNANCY: Is there any chance you are pregnant? When was your last menstrual period?     N/A.  Protocols used: Neurologic Deficit-A-AH

## 2024-05-18 NOTE — Telephone Encounter (Signed)
 FYI  KP

## 2024-05-19 ENCOUNTER — Emergency Department

## 2024-05-19 ENCOUNTER — Encounter: Payer: Self-pay | Admitting: Emergency Medicine

## 2024-05-19 ENCOUNTER — Other Ambulatory Visit: Payer: Self-pay

## 2024-05-19 ENCOUNTER — Other Ambulatory Visit: Payer: Self-pay | Admitting: Family

## 2024-05-19 ENCOUNTER — Emergency Department
Admission: EM | Admit: 2024-05-19 | Discharge: 2024-05-19 | Disposition: A | Discharge status: Home / Self Care | Attending: Emergency Medicine | Admitting: Emergency Medicine

## 2024-05-19 DIAGNOSIS — I2510 Atherosclerotic heart disease of native coronary artery without angina pectoris: Secondary | ICD-10-CM | POA: Insufficient documentation

## 2024-05-19 DIAGNOSIS — R519 Headache, unspecified: Secondary | ICD-10-CM | POA: Insufficient documentation

## 2024-05-19 DIAGNOSIS — I110 Hypertensive heart disease with heart failure: Secondary | ICD-10-CM | POA: Insufficient documentation

## 2024-05-19 DIAGNOSIS — I509 Heart failure, unspecified: Secondary | ICD-10-CM | POA: Insufficient documentation

## 2024-05-19 DIAGNOSIS — S4991XA Unspecified injury of right shoulder and upper arm, initial encounter: Secondary | ICD-10-CM | POA: Diagnosis not present

## 2024-05-19 DIAGNOSIS — M25511 Pain in right shoulder: Secondary | ICD-10-CM | POA: Diagnosis present

## 2024-05-19 DIAGNOSIS — I6930 Unspecified sequelae of cerebral infarction: Secondary | ICD-10-CM

## 2024-05-19 DIAGNOSIS — I10 Essential (primary) hypertension: Secondary | ICD-10-CM

## 2024-05-19 DIAGNOSIS — Z8673 Personal history of transient ischemic attack (TIA), and cerebral infarction without residual deficits: Secondary | ICD-10-CM | POA: Insufficient documentation

## 2024-05-19 DIAGNOSIS — W19XXXA Unspecified fall, initial encounter: Secondary | ICD-10-CM | POA: Diagnosis not present

## 2024-05-19 DIAGNOSIS — Y92009 Unspecified place in unspecified non-institutional (private) residence as the place of occurrence of the external cause: Secondary | ICD-10-CM

## 2024-05-19 LAB — CBC
HCT: 52 % (ref 39.0–52.0)
Hemoglobin: 16.9 g/dL (ref 13.0–17.0)
MCH: 27.9 pg (ref 26.0–34.0)
MCHC: 32.5 g/dL (ref 30.0–36.0)
MCV: 85.8 fL (ref 80.0–100.0)
Platelets: 289 K/uL (ref 150–400)
RBC: 6.06 MIL/uL — ABNORMAL HIGH (ref 4.22–5.81)
RDW: 13.6 % (ref 11.5–15.5)
WBC: 10.9 K/uL — ABNORMAL HIGH (ref 4.0–10.5)
nRBC: 0 % (ref 0.0–0.2)

## 2024-05-19 LAB — COMPREHENSIVE METABOLIC PANEL WITH GFR
ALT: 17 U/L (ref 0–44)
AST: 18 U/L (ref 15–41)
Albumin: 3.6 g/dL (ref 3.5–5.0)
Alkaline Phosphatase: 56 U/L (ref 38–126)
Anion gap: 10 (ref 5–15)
BUN: 9 mg/dL (ref 6–20)
CO2: 22 mmol/L (ref 22–32)
Calcium: 9.3 mg/dL (ref 8.9–10.3)
Chloride: 106 mmol/L (ref 98–111)
Creatinine, Ser: 0.79 mg/dL (ref 0.61–1.24)
GFR, Estimated: >60 mL/min (ref >60)
Glucose, Bld: 116 mg/dL — ABNORMAL HIGH (ref 70–99)
Potassium: 3.8 mmol/L (ref 3.5–5.1)
Sodium: 138 mmol/L (ref 135–145)
Total Bilirubin: 0.8 mg/dL (ref 0.0–1.2)
Total Protein: 7.6 g/dL (ref 6.5–8.1)

## 2024-05-19 LAB — PROTIME-INR
INR: 1 (ref 0.8–1.2)
Prothrombin Time: 13.3 s (ref 11.4–15.2)

## 2024-05-19 NOTE — Discharge Instructions (Signed)
 Your exam, labs, x-ray, and CT scan are all normal and reassuring at this time.  No signs of a head bleed or acute stroke at this time.  You should follow-up with your primary provider for ongoing evaluation.  You should also schedule an exam with your neurologist for reevaluation of your gait and/or balance issue.  Be sure to use your assistive device for ambulating and position changes.

## 2024-05-19 NOTE — Telephone Encounter (Signed)
 Spoke with Ricardo Yang and she is taking him to [facility] now.   JM

## 2024-05-19 NOTE — ED Triage Notes (Signed)
 Patient to ED via POV for a fall yesterday. Pt states he is unsure how he fell. Possible LOC- unsure. C/o right shoulder pain.   Pt's mother with him and states he does not have legal guardian as stated in chart.

## 2024-05-19 NOTE — ED Provider Notes (Signed)
 [facility] Emergency Department Provider Note     Event Date/Time   First MD Initiated Contact with Patient 05/19/24 1630     (approximate)   History   Fall   HPI  Ricardo Yang is a 48 y.o. 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  He arrives via POV from home, for evaluation following mechanical fall yesterday.  Patient reports he is unclear of how he fell.  He is unsure of whether he had a momentary loss of consciousness.  His primary complaint today is right shoulder pain.  Chart review reveals the patient and his mother reached out to the PCP office today prior to arrival.  During that phone exchange, they endorsed symptoms including: dysphasia, worsening right sided weakness (leg dragging), unsteady gait, dizziness, blurry vision, intermittent headaches, patient states felt like a had a stroke about 2 months ago gradually worsening.  By their report, the symptoms actually onset about 2 months ago.  Patient has not sought care or intervention with this recently reported change in symptoms.  They were advised to report to the ED for reevaluation.  They present at this time at the request of the primary provider.   Physical Exam   Triage Vital Signs: ED Triage Vitals [05/19/24 1409]  Encounter Vitals Group     BP 130/87     Girls Systolic BP Percentile      Girls Diastolic BP Percentile      Boys Systolic BP Percentile      Boys Diastolic BP Percentile      Pulse Rate 89     Resp 17     Temp 98.8 F (37.1 C)     Temp Source Oral     SpO2 99 %     Weight 300 lb (136.1 kg)     Height 6' 4 (1.93 m)     Head Circumference      Peak Flow      Pain Score 5     Pain Loc      Pain Education      Exclude from Growth Chart     Most recent vital signs: Vitals:   05/19/24 1409  BP: 130/87  Pulse: 89   Resp: 17  Temp: 98.8 F (37.1 C)  SpO2: 99%    General Awake, no distress. NAD A&o x 4 HEENT NCAT. PERRL. EOMI. No rhinorrhea. Mucous membranes are moist.  CV:  Good peripheral perfusion. RRR RESP:  Normal effort. CTA ABD:  No distention.  MSK:  RUE without obvious deformity, dislocation, or sulcus sign.  Active range of motion to the right upper extremity noted, with baseline deficit from prior CVA. NEURO: Cranial nerves II to XII grossly intact.   ED Results / Procedures / Treatments   Labs (all labs ordered are listed, but only abnormal results are displayed) Labs Reviewed  COMPREHENSIVE METABOLIC PANEL WITH GFR - Abnormal; Notable for the following components:      Result Value   Glucose, Bld 116 (*)    All other components within normal limits  CBC - Abnormal; Notable for the following components:   WBC 10.9 (*)    RBC 6.06 (*)    All other components within normal limits  PROTIME-INR    EKG  Vent. rate 87 BPM PR interval 164 ms QRS duration 86 ms  QT/QTcB 384/462 ms P-R-T axes 56 165 34 Normal sinus rhythm Septal infarct (cited on or before 19-May-2024) Lateral infarct (cited on or before 19-May-2024) Abnormal ECG When compared with ECG of 28-Jul-2023 13:22, Questionable change in QRS axis Questionable change in initial forces No STEMI  RADIOLOGY  I personally viewed and evaluated these images as part of my medical decision making, as well as reviewing the written report by the radiologist.  ED Provider Interpretation: no acute finding  CT Head w/o CM  IMPRESSION: No acute intracranial abnormality.  DG Right Shoulder  IMPRESSION: No acute fracture or dislocation.  PROCEDURES:  Critical Care performed: No  Procedures   MEDICATIONS ORDERED IN ED: Medications - No data to display   IMPRESSION / MDM / ASSESSMENT AND PLAN / ED COURSE  I reviewed the triage vital signs and the nursing notes.                              Differential  diagnosis includes, but is not limited to, alcohol, illicit or prescription medications, or other toxic ingestion; intracranial pathology such as stroke or intracerebral hemorrhage; fever or infectious causes including sepsis; hypoxemia and/or hypercarbia; uremia; trauma; endocrine related disorders such as diabetes, hypoglycemia, and thyroid-related diseases; hypertensive encephalopathy; etc.  Patient's presentation is most consistent with acute presentation with potential threat to life or bodily function.  Patient's diagnosis is consistent with recent fall at home with reported increased balance issues secondary to old stroke.  Patient presents in no acute stress for evaluation of his injuries.  He has had balance issues for the last 6 to 8 months.  He is not followed up with neurology as was suggested.  He is also not sought care for any concern for new symptoms.  By mom's report, patient's last known change from baseline was more than 2 months ago.  Patient with overall reassuring exam and workup at this time.  No acute lab abnormalities are noted.  No CT evidence of any acute hemorrhagic stroke, based on my interpretation.  No acute weakness, paralysis, slurred speech, or deficit on exam.  Plain x-ray of the shoulder negative for any acute fracture or dislocation based on my interpretation.  Patient will be discharged home with instructions to take his home meds as prescribed. Patient is to follow up with his PCP or neurologist as suggested, as needed or otherwise directed. Patient is given ED precautions to return to the ED for any worsening or new symptoms.   FINAL CLINICAL IMPRESSION(S) / ED DIAGNOSES   Final diagnoses:  Fall in home, initial encounter  Injury of right shoulder, initial encounter  History of CVA (cerebrovascular accident)     Rx / DC Orders   ED Discharge Orders     None        Note:  This document was prepared using Dragon voice recognition software and may  include unintentional dictation errors.    Loyd Candida LULLA Aldona, PA-C 05/24/24 2234    Dorothyann Drivers, MD 05/24/24 2314

## 2024-05-20 ENCOUNTER — Other Ambulatory Visit: Payer: Self-pay | Admitting: Family Medicine

## 2024-05-20 ENCOUNTER — Other Ambulatory Visit: Payer: Self-pay

## 2024-05-20 DIAGNOSIS — I10 Essential (primary) hypertension: Secondary | ICD-10-CM

## 2024-05-20 DIAGNOSIS — I6930 Unspecified sequelae of cerebral infarction: Secondary | ICD-10-CM

## 2024-05-20 DIAGNOSIS — I2510 Atherosclerotic heart disease of native coronary artery without angina pectoris: Secondary | ICD-10-CM

## 2024-05-23 ENCOUNTER — Other Ambulatory Visit: Payer: Self-pay

## 2024-05-23 ENCOUNTER — Ambulatory Visit (INDEPENDENT_AMBULATORY_CARE_PROVIDER_SITE_OTHER): Admitting: Family Medicine

## 2024-05-23 ENCOUNTER — Encounter: Payer: Self-pay | Admitting: Family Medicine

## 2024-05-23 VITALS — BP 116/80 | HR 80 | Ht 76.0 in | Wt 284.6 lb

## 2024-05-23 DIAGNOSIS — F3289 Other specified depressive episodes: Secondary | ICD-10-CM | POA: Diagnosis not present

## 2024-05-23 DIAGNOSIS — I6930 Unspecified sequelae of cerebral infarction: Secondary | ICD-10-CM | POA: Diagnosis not present

## 2024-05-23 DIAGNOSIS — M1711 Unilateral primary osteoarthritis, right knee: Secondary | ICD-10-CM

## 2024-05-23 NOTE — Telephone Encounter (Signed)
 Requested medication (s) are due for refill today: yes  Requested medication (s) are on the active medication list: yes  Last refill:  04/20/23  Future visit scheduled: yes  Notes to clinic:  Unable to refill per protocol, last refill by another provider not at this practice. Lasted refilled by pain management clinic.     Requested Prescriptions  Pending Prescriptions Disp Refills   atorvastatin  (LIPITOR ) 80 MG tablet 30 tablet 0    Sig: Take 1 tablet (80 mg total) by mouth daily at 6 PM.     Cardiovascular:  Antilipid - Statins Failed - 05/23/2024 10:29 AM      Failed - Valid encounter within last 12 months    Recent Outpatient Visits   None            Failed - Lipid Panel in normal range within the last 12 months    Cholesterol, Total  Date Value Ref Range Status  10/07/2023 179 100 - 199 mg/dL Final   LDL Chol Calc (NIH)  Date Value Ref Range Status  10/07/2023 114 (H) 0 - 99 mg/dL Final   HDL  Date Value Ref Range Status  10/07/2023 47 >39 mg/dL Final   Triglycerides  Date Value Ref Range Status  10/07/2023 96 0 - 149 mg/dL Final         Passed - Patient is not pregnant       clopidogrel  (PLAVIX ) 75 MG tablet 30 tablet 5    Sig: Take 1 tablet (75 mg total) by mouth daily with breakfast.     Hematology: Antiplatelets - clopidogrel  Failed - 05/23/2024 10:29 AM      Failed - Valid encounter within last 6 months    Recent Outpatient Visits   None            Passed - HCT in normal range and within 180 days    HCT  Date Value Ref Range Status  05/19/2024 52.0 39.0 - 52.0 % Final   Hematocrit  Date Value Ref Range Status  10/07/2023 47.7 37.5 - 51.0 % Final         Passed - HGB in normal range and within 180 days    Hemoglobin  Date Value Ref Range Status  05/19/2024 16.9 13.0 - 17.0 g/dL Final  87/88/7975 84.3 13.0 - 17.7 g/dL Final         Passed - PLT in normal range and within 180 days    Platelets  Date Value Ref Range Status   05/19/2024 289 150 - 400 K/uL Final  10/07/2023 229 150 - 450 x10E3/uL Final         Passed - Cr in normal range and within 360 days    Creatinine, Ser  Date Value Ref Range Status  05/19/2024 0.79 0.61 - 1.24 mg/dL Final          FARXIGA  10 MG TABS tablet [Pharmacy Med Name: dapagliflozin  propanediol (FARXIGA ) 10 MG Tab tablet] 90 tablet 0    Sig: Take 1 tablet (10 mg total) by mouth daily. NEEDS FOLLOW UP APPOINTMENT FOR MORE REFILLS     Endocrinology:  Diabetes - SGLT2 Inhibitors Failed - 05/23/2024 10:29 AM      Failed - HBA1C is between 0 and 7.9 and within 180 days    Hgb A1c MFr Bld  Date Value Ref Range Status  10/07/2023 5.8 (H) 4.8 - 5.6 % Final    Comment:  Prediabetes: 5.7 - 6.4          Diabetes: >6.4          Glycemic control for adults with diabetes: <7.0          Failed - Valid encounter within last 6 months    Recent Outpatient Visits   None            Passed - Cr in normal range and within 360 days    Creatinine, Ser  Date Value Ref Range Status  05/19/2024 0.79 0.61 - 1.24 mg/dL Final         Passed - eGFR in normal range and within 360 days    GFR calc Af Amer  Date Value Ref Range Status  08/08/2019 >60 >60 mL/min Final   GFR, Estimated  Date Value Ref Range Status  05/19/2024 >60 >60 mL/min Final    Comment:    (NOTE) Calculated using the CKD-EPI Creatinine Equation (2021)    eGFR  Date Value Ref Range Status  10/07/2023 107 >59 mL/min/1.73 Final          metoprolol  succinate (TOPROL -XL) 25 MG 24 hr tablet 30 tablet 0    Sig: Take 1 tablet (25 mg total) by mouth daily. Dose change     Cardiovascular:  Beta Blockers Failed - 05/23/2024 10:29 AM      Failed - Valid encounter within last 6 months    Recent Outpatient Visits   None            Passed - Last BP in normal range    BP Readings from Last 1 Encounters:  05/19/24 130/87         Passed - Last Heart Rate in normal range    Pulse Readings from Last 1  Encounters:  05/19/24 89          valsartan  (DIOVAN ) 40 MG tablet 30 tablet 5    Sig: Take 1 tablet (40 mg total) by mouth daily.     Cardiovascular:  Angiotensin Receptor Blockers Failed - 05/23/2024 10:29 AM      Failed - Valid encounter within last 6 months    Recent Outpatient Visits   None            Passed - Cr in normal range and within 180 days    Creatinine, Ser  Date Value Ref Range Status  05/19/2024 0.79 0.61 - 1.24 mg/dL Final         Passed - K in normal range and within 180 days    Potassium  Date Value Ref Range Status  05/19/2024 3.8 3.5 - 5.1 mmol/L Final         Passed - Patient is not pregnant      Passed - Last BP in normal range    BP Readings from Last 1 Encounters:  05/19/24 130/87

## 2024-05-24 ENCOUNTER — Other Ambulatory Visit: Payer: Self-pay

## 2024-05-25 ENCOUNTER — Other Ambulatory Visit: Payer: Self-pay

## 2024-05-25 ENCOUNTER — Ambulatory Visit
Admission: RE | Admit: 2024-05-25 | Discharge: 2024-05-25 | Disposition: A | Discharge status: Home / Self Care | Source: Ambulatory Visit | Attending: Family Medicine | Admitting: Family Medicine

## 2024-05-25 ENCOUNTER — Telehealth: Payer: Self-pay

## 2024-05-25 DIAGNOSIS — F32A Depression, unspecified: Secondary | ICD-10-CM | POA: Insufficient documentation

## 2024-05-25 DIAGNOSIS — I6930 Unspecified sequelae of cerebral infarction: Secondary | ICD-10-CM | POA: Insufficient documentation

## 2024-05-25 DIAGNOSIS — I63512 Cerebral infarction due to unspecified occlusion or stenosis of left middle cerebral artery: Secondary | ICD-10-CM

## 2024-05-25 MED ORDER — DULOXETINE HCL 60 MG PO CPEP
60.0000 mg | ORAL_CAPSULE | Freq: Every day | ORAL | 0 refills | Status: DC
  Filled 2024-05-25: qty 90, 90d supply, fill #0

## 2024-05-25 MED ORDER — DULOXETINE HCL 30 MG PO CPEP
30.0000 mg | ORAL_CAPSULE | Freq: Every day | ORAL | 0 refills | Status: DC
  Filled 2024-05-25: qty 7, 7d supply, fill #0

## 2024-05-25 NOTE — Telephone Encounter (Signed)
 Spoke with Barnie patients mother and let her know I will place a new STAT referral to Neurology for patient. I also let her know I will let referral coordinator know she prefers [place] location. Barnie let me know if she can't get [place] location she is okay with that. I asked her to also call around and if she finds a neurologist that can see patient ASAP to call back and we will get referral sent to that office. She was in agreement with this plan and thankful for the call back.   JM   Copied from CRM [id number]. Topic: Referral - Question >> May 25, 2024  9:39 AM Treva T wrote: Reason for CRM: Received call from mom, Barnie (DPR verified), calling on behalf of patient.  Patient mother inquiring if another referral to a different Neurologist can be made, as the previous referral to [facility] cannot see patient until November 18, which mom states is too far out.  Mom is requesting another referral to any other Neurologist, with Provider recommendations with sooner availability, and [place] location is preferred.   Please call mom to discuss further, can be reached at [phone].

## 2024-05-25 NOTE — Progress Notes (Signed)
 Primary Care / Sports Medicine Office Visit  Patient Information:  Patient ID: Ricardo Yang, male DOB: 07/22/1976 Age: 48 y.o. MRN: [id number]   Ricardo Yang is a pleasant 48 y.o. male presenting with the following:  Chief Complaint  Patient presents with   Hospitalization Follow-up    Patient presents today for a hospital follow up. He recently had a visit at the ER on 05/20/24. He had a fall on 05/18/24 which led to him walking differently ( dragging his right foot and could not explain thing and was getting frustrated. He is doing somewhat better today. Hi mother was with him at the house she did not see him fall so they are unsure if he hit his head.     Vitals:   05/23/24 1315  BP: 116/80  Pulse: 80  SpO2: 99%   Vitals:   05/23/24 1315  Weight: 284 lb 9.6 oz (129.1 kg)  Height: 6' 4 (1.93 m)   Body mass index is 34.64 kg/m.  CT Head Wo Contrast Result Date: 05/19/2024 CLINICAL DATA:  Mental status change, unknown cause Neuro deficit, acute, stroke suspected. Possible MRI head fall EXAM: CT HEAD WITHOUT CONTRAST TECHNIQUE: Contiguous axial images were obtained from the base of the skull through the vertex without intravenous contrast. RADIATION DOSE REDUCTION: This exam was performed according to the departmental dose-optimization program which includes automated exposure control, adjustment of the mA and/or kV according to patient size and/or use of iterative reconstruction technique. COMPARISON:  MRI head 10/04/2022 FINDINGS: Brain: Chronic right frontal infarction. No evidence of large-territorial acute infarction. No parenchymal hemorrhage. No mass lesion. No extra-axial collection. No mass effect or midline shift. No hydrocephalus. Basilar cisterns are patent. Vascular: No hyperdense vessel. Skull: No acute fracture or focal lesion. Sinuses/Orbits: Chronic right sphenoid sinus mucosal thickening. Otherwise paranasal sinuses and mastoid air cells are clear. The orbits are  unremarkable. Other: None. IMPRESSION: No acute intracranial abnormality. Electronically Signed   By: Morgane  Naveau M.D.   On: 05/19/2024 18:07   DG Shoulder Right Result Date: 05/19/2024 CLINICAL DATA:  fall, right shoulder pain EXAM: RIGHT SHOULDER - 2+ VIEW COMPARISON:  Mar 03, 2023 FINDINGS: No acute fracture or dislocation. Unchanged mild to moderate osteoarthritis of the AC joint. Soft tissues are unremarkable. IMPRESSION: No acute fracture or dislocation. Electronically Signed   By: Rogelia Myers M.D.   On: 05/19/2024 15:25     Independent interpretation of notes and tests performed by another provider:   None  Procedures performed:   None  Pertinent History, Exam, Impression, and Recommendations:   Problem List Items Addressed This Visit     Depression   Mood disturbance - Low mood with persistent feelings of being in the 'doldrums' - Low energy and motivation - Occasionally self-medicates with smoking - No prior use of medications for mood symptoms  Depression PHQ score of 18 indicating significant depressive symptoms. - Start duloxetine  30 mg once daily, preferably in the morning with other medications. - After 1 week, transition to duloxetine  60 mg, dosing once daily - Schedule video visit in two months to assess response to SSRI.      Relevant Medications   DULoxetine  (CYMBALTA ) 30 MG capsule   DULoxetine  (CYMBALTA ) 60 MG capsule   History of CVA with residual deficit - Primary   History of Present Illness Ricardo Yang is a 48 year old male with a history of strokes and heart attacks who presents with new right-sided weakness.  Right-sided weakness - New onset right-sided weakness, similar to previous stroke episodes - Preliminary CT scan in the ER (05/19/2024) showed no acute changes - History of multiple strokes, with last MRI performed in December 2023 - Missed neurology appointment in April 2025 due to illness - Completed rehabilitation previously with  improvement in speech and weakness, but discontinued due to cost  05/19/2024 ER summary as follows: 48 year old male with PMH of right hemiplegia post-stroke, CAD, CHF, HTN, HLD, MS, and polysubstance use presents after a mechanical fall with right shoulder pain and chronic worsening neurologic symptoms over 2 months. Head CT shows no acute hemorrhage; shoulder x-ray negative for fracture/dislocation. No acute deficits on exam; labs unremarkable. Discharged with ED precautions and advised PCP/neurology follow-up.  Stroke history Recent right-sided symptoms with no acute changes on CT. History of multiple strokes and heart attacks. Missed neurology follow-up in April. - Order MRI to compare with December 2023 scan. - Contact Dr. Diona at [facility] for follow-up appointment. - Continue aspirin  and Plavix  as per previous instructions.      Relevant Orders   MR Brain Wo Contrast   Osteoarthritis of right knee   Right knee pain and instability - Right knee pain characterized by unsteadiness and soreness - Known medial and patellofemoral OA on x-rays dated 03/03/2023 - Symptoms worsen when moving from a bent to a straight position - Uses Tylenol  for pain management  Physical Exam Right Knee Exam INSPECTION: WNL PALPATION: Trace effusion, Tenderness at the lateral patellar facet and lateral joint line, Medial joint line non-tender RANGE OF MOTION (ROM) ASSESSMENT: 0-125 degrees with pain at terminal flexion NEUROMUSCULAR: Grossly intact SPECIAL TESTS: Negative anterior and posterior drawer tests, Varus stress test elicits lateral pain without laxity  Knee osteoarthritis pain with effusion Right knee pain with trace effusion and tenderness at the lateral patellar facet. Painful terminal flexion and tenderness on varus stress. - Start Tylenol  1000 mg three times daily. - Apply ice for 20 minutes as needed. - Provide hinge knee brace for support during ambulation. -  Schedule follow-up in one to two weeks for reassessment      A total of 41 minutes was spent on the date of service, 05/25/2024, encompassing both face-to-face and non-face-to-face time. This included review of prior records and imaging (e.g., MRI and/or radiographs), medical chart review, information gathering, documentation, care coordination with clinic staff, discussion and counseling with the patient regarding clinical findings and treatment options, and planning for follow-up and next steps in management.   Orders & Medications Medications:  Meds ordered this encounter  Medications   DULoxetine  (CYMBALTA ) 30 MG capsule    Sig: Take 1 capsule (30 mg total) by mouth daily for 7 days. Then increase to duloxetine  60 mg.    Dispense:  7 capsule    Refill:  0   DULoxetine  (CYMBALTA ) 60 MG capsule    Sig: Take 1 capsule (60 mg total) by mouth daily.    Dispense:  90 capsule    Refill:  0   Orders Placed This Encounter  Procedures   MR Brain Wo Contrast     No follow-ups on file.     Selinda JINNY Ku, MD, [facility]   Primary Care Sports Medicine Primary Care and Sports Medicine at [facility]

## 2024-05-25 NOTE — Assessment & Plan Note (Signed)
 History of Present Illness Ricardo Yang is a 48 year old male with a history of strokes and heart attacks who presents with new right-sided weakness.  Right-sided weakness - New onset right-sided weakness, similar to previous stroke episodes - Preliminary CT scan in the ER (05/19/2024) showed no acute changes - History of multiple strokes, with last MRI performed in December 2023 - Missed neurology appointment in April 2025 due to illness - Completed rehabilitation previously with improvement in speech and weakness, but discontinued due to cost  05/19/2024 ER summary as follows: 48 year old male with PMH of right hemiplegia post-stroke, CAD, CHF, HTN, HLD, MS, and polysubstance use presents after a mechanical fall with right shoulder pain and chronic worsening neurologic symptoms over 2 months. Head CT shows no acute hemorrhage; shoulder x-ray negative for fracture/dislocation. No acute deficits on exam; labs unremarkable. Discharged with ED precautions and advised PCP/neurology follow-up.  Stroke history Recent right-sided symptoms with no acute changes on CT. History of multiple strokes and heart attacks. Missed neurology follow-up in April. - Order MRI to compare with December 2023 scan. - Contact Dr. Diona at [facility] for follow-up appointment. - Continue aspirin  and Plavix  as per previous instructions.

## 2024-05-25 NOTE — Patient Instructions (Signed)
 Patient Plan for Post-Visit Guidance  1. For depression:    - Start duloxetine  30 mg once daily in the morning with other medications.    - After one week, increase duloxetine  to 60 mg once daily.    - Schedule a video visit in two months to assess response to treatment.  2. For stroke history and new right-sided weakness:    - Complete the ordered brain MRI.    - Contact Dr. Diona at [facility] to schedule a neurology follow-up.    - Continue taking aspirin  and Plavix  as previously instructed.  3. For right knee osteoarthritis:    - Take Tylenol  1000 mg three times daily for pain.    - Apply ice to the right knee for 20 minutes as needed.    - Use the hinge knee brace for support when walking.    - Schedule a follow-up appointment in one to two weeks for reassessment.  Red Flags: - If you experience sudden or severe weakness, numbness, difficulty speaking, chest pain, shortness of breath, severe knee swelling, redness, or fever, seek immediate medical attention.

## 2024-05-25 NOTE — Assessment & Plan Note (Signed)
 Mood disturbance - Low mood with persistent feelings of being in the 'doldrums' - Low energy and motivation - Occasionally self-medicates with smoking - No prior use of medications for mood symptoms  Depression PHQ score of 18 indicating significant depressive symptoms. - Start duloxetine  30 mg once daily, preferably in the morning with other medications. - After 1 week, transition to duloxetine  60 mg, dosing once daily - Schedule video visit in two months to assess response to SSRI.

## 2024-05-25 NOTE — Assessment & Plan Note (Signed)
 Right knee pain and instability - Right knee pain characterized by unsteadiness and soreness - Known medial and patellofemoral OA on x-rays dated 03/03/2023 - Symptoms worsen when moving from a bent to a straight position - Uses Tylenol  for pain management  Physical Exam Right Knee Exam INSPECTION: WNL PALPATION: Trace effusion, Tenderness at the lateral patellar facet and lateral joint line, Medial joint line non-tender RANGE OF MOTION (ROM) ASSESSMENT: 0-125 degrees with pain at terminal flexion NEUROMUSCULAR: Grossly intact SPECIAL TESTS: Negative anterior and posterior drawer tests, Varus stress test elicits lateral pain without laxity  Knee osteoarthritis pain with effusion Right knee pain with trace effusion and tenderness at the lateral patellar facet. Painful terminal flexion and tenderness on varus stress. - Start Tylenol  1000 mg three times daily. - Apply ice for 20 minutes as needed. - Provide hinge knee brace for support during ambulation. - Schedule follow-up in one to two weeks for reassessment

## 2024-05-26 ENCOUNTER — Other Ambulatory Visit: Payer: Self-pay

## 2024-05-26 MED FILL — Atorvastatin Calcium Tab 80 MG (Base Equivalent): ORAL | 30 days supply | Qty: 30 | Fill #0 | Status: CN

## 2024-05-26 MED FILL — Clopidogrel Bisulfate Tab 75 MG (Base Equiv): ORAL | 30 days supply | Qty: 30 | Fill #0 | Status: AC

## 2024-05-26 MED FILL — Valsartan Tab 40 MG: ORAL | 30 days supply | Qty: 30 | Fill #0 | Status: AC

## 2024-05-26 MED FILL — Dapagliflozin Propanediol Tab 10 MG (Base Equivalent): ORAL | 90 days supply | Qty: 90 | Fill #0 | Status: CN

## 2024-05-26 MED FILL — Metoprolol Succinate Tab ER 24HR 25 MG (Tartrate Equiv): ORAL | 30 days supply | Qty: 30 | Fill #0 | Status: AC

## 2024-05-26 MED FILL — Atorvastatin Calcium Tab 80 MG (Base Equivalent): ORAL | 30 days supply | Qty: 30 | Fill #0 | Status: AC

## 2024-05-30 ENCOUNTER — Other Ambulatory Visit (INDEPENDENT_AMBULATORY_CARE_PROVIDER_SITE_OTHER): Payer: Self-pay | Admitting: Radiology

## 2024-05-30 ENCOUNTER — Other Ambulatory Visit (HOSPITAL_COMMUNITY): Payer: Self-pay

## 2024-05-30 ENCOUNTER — Ambulatory Visit (INDEPENDENT_AMBULATORY_CARE_PROVIDER_SITE_OTHER): Admitting: Family Medicine

## 2024-05-30 ENCOUNTER — Encounter: Payer: Self-pay | Admitting: Family Medicine

## 2024-05-30 ENCOUNTER — Telehealth: Payer: Self-pay | Admitting: Pharmacy Technician

## 2024-05-30 VITALS — BP 116/74 | HR 79 | Ht 76.0 in | Wt 286.0 lb

## 2024-05-30 DIAGNOSIS — M1711 Unilateral primary osteoarthritis, right knee: Secondary | ICD-10-CM | POA: Diagnosis not present

## 2024-05-30 MED ORDER — TRIAMCINOLONE ACETONIDE 40 MG/ML IJ SUSP
40.0000 mg | Freq: Once | INTRAMUSCULAR | Status: AC
  Administered 2024-05-30: 40 mg via INTRAMUSCULAR

## 2024-05-30 NOTE — Patient Instructions (Signed)
 You have just been given a cortisone injection to reduce pain and inflammation. After the injection you may notice immediate relief of pain as a result of the Lidocaine . It is important to rest the area of the injection for 24 to 48 hours after the injection. There is a possibility of some temporary increased discomfort and swelling for up to 72 hours until the cortisone begins to work. If you do have pain, simply rest the joint and use ice. If you can tolerate over the counter medications, you can try Tylenol , Aleve , or Advil  for added relief per package instructions.  Patient Plan for Post-Visit Guidance  Right Knee Osteoarthritis  - Cortisone injection given today. - Apply ice to your knee after the procedure and before bedtime. - Rest your knee as much as possible for the next two days. - Contact the office if pain continues beyond two weeks. - A repeat injection may be considered in three months if symptoms return.   Red Flags: - If you develop severe pain, redness, swelling, fever, or difficulty moving your knee, contact the office immediately.

## 2024-05-30 NOTE — Telephone Encounter (Signed)
 Please review and advise.   JM

## 2024-05-30 NOTE — Progress Notes (Signed)
     Primary Care / Sports Medicine Office Visit  Patient Information:  Patient ID: [place] L Ovitt, male DOB: [birth year] Age: 48 y.o. MRN: [id number]   [place] L Koelzer is a pleasant 48 y.o. male presenting with the following:  Chief Complaint  Patient presents with   Knee Pain    Patient presents today for a cortisone injection in his right knee.     Vitals:   05/30/24 1527  BP: 116/74  Pulse: 79  SpO2: 98%   Vitals:   05/30/24 1527  Weight: 286 lb (129.7 kg)  Height: 6' 4 (1.93 m)   Body mass index is 34.81 kg/m.   Independent interpretation of notes and tests performed by another provider:   None  Procedures performed:   Procedure:  Injection of right knee under ultrasound guidance. Ultrasound guidance utilized for anteromedial approach, joint space visualized Samsung HS60 device utilized with permanent recording / reporting. Verbal informed consent obtained and verified. Skin prepped in a sterile fashion. Ethyl chloride for topical local analgesia.  Completed without difficulty and tolerated well. Medication: triamcinolone  acetonide 40 mg/mL suspension for injection 1 mL total and 2 mL lidocaine  1% without epinephrine utilized for needle placement anesthetic Advised to contact for fevers/chills, erythema, induration, drainage, or persistent bleeding.   Pertinent History, Exam, Impression, and Recommendations:   Problem List Items Addressed This Visit     Osteoarthritis of right knee - Primary   History of Present Illness [place] L Hulse is a 48 year old male who presents for right knee pain.  Knee pain - Chronic, recurring knee pain - Previous cortisone injections have been effective in alleviating pain - Pain limits participation in activities such as playing ball, though he has not recently engaged in these activities  Physical Exam RANGE OF MOTION: Full active and passive range of motion in the knee without pain or limitation.  Assessment and Plan Right  knee osteoarthritis Chronic pain managed with cortisone injections. Previous injections 11/03/2023 effective. Anticipate similar response. - Perform ultrasound-guided cortisone injection today. - Advise icing knee post-procedure and before bedtime. - Recommend relative rest for two days post-injection. - Instruct to contact if pain persists beyond two weeks. - Discuss potential for repeat injection in three months if symptoms necessitate.      Relevant Orders   US  LIMITED JOINT SPACE STRUCTURES LOW RIGHT     Orders & Medications Medications:  Meds ordered this encounter  Medications   triamcinolone  acetonide (KENALOG -40) injection 40 mg   Orders Placed This Encounter  Procedures   US  LIMITED JOINT SPACE STRUCTURES LOW RIGHT     No follow-ups on file.     Selinda JINNY Ku, MD, [facility]   Primary Care Sports Medicine Primary Care and Sports Medicine at [facility]

## 2024-05-30 NOTE — Assessment & Plan Note (Signed)
 History of Present Illness Ricardo Yang is a 48 year old male who presents for right knee pain.  Knee pain - Chronic, recurring knee pain - Previous cortisone injections have been effective in alleviating pain - Pain limits participation in activities such as playing ball, though he has not recently engaged in these activities  Physical Exam RANGE OF MOTION: Full active and passive range of motion in the knee without pain or limitation.  Assessment and Plan Right knee osteoarthritis Chronic pain managed with cortisone injections. Previous injections 11/03/2023 effective. Anticipate similar response. - Perform ultrasound-guided cortisone injection today. - Advise icing knee post-procedure and before bedtime. - Recommend relative rest for two days post-injection. - Instruct to contact if pain persists beyond two weeks. - Discuss potential for repeat injection in three months if symptoms necessitate.

## 2024-05-30 NOTE — Telephone Encounter (Signed)
 Prior Authorization form/request asks a question that requires your assistance. Please see the question below and advise accordingly. The PA will not be submitted until the necessary information is received.   What is the ICD 10 code to send in with his PA for Farxiga  please??  Thanks

## 2024-05-31 ENCOUNTER — Other Ambulatory Visit (HOSPITAL_COMMUNITY): Payer: Self-pay

## 2024-05-31 ENCOUNTER — Telehealth: Payer: Self-pay | Admitting: Pharmacy Technician

## 2024-05-31 NOTE — Telephone Encounter (Signed)
 Pharmacy Patient Advocate Encounter   Received notification from CoverMyMeds that prior authorization for Dapagliflozin  Propanediol 10MG  tablets is required/requested.   Insurance verification completed.   The patient is insured through [facility] .   Per test claim: PA required; PA submitted to above mentioned insurance via CoverMyMeds Key/confirmation #/EOC [id number] Status is pending

## 2024-06-02 ENCOUNTER — Other Ambulatory Visit (HOSPITAL_COMMUNITY): Payer: Self-pay

## 2024-06-02 NOTE — Telephone Encounter (Signed)
 Pharmacy Patient Advocate Encounter  Received notification from [facility] that Prior Authorization for Dapagliflozin  Propanediol 10MG  tablets  has been APPROVED from 05/31/24 to 05/31/25. Ran test claim, Copay is $4.00. This test claim was processed through [facility]- copay amounts may vary at other pharmacies due to pharmacy/plan contracts, or as the patient moves through the different stages of their insurance plan.   PA #/Case ID/Reference #: [id number]

## 2024-07-01 ENCOUNTER — Other Ambulatory Visit: Payer: Self-pay | Admitting: Family Medicine

## 2024-07-01 ENCOUNTER — Other Ambulatory Visit: Payer: Self-pay

## 2024-07-01 DIAGNOSIS — I10 Essential (primary) hypertension: Secondary | ICD-10-CM

## 2024-07-01 DIAGNOSIS — I6930 Unspecified sequelae of cerebral infarction: Secondary | ICD-10-CM

## 2024-07-01 MED FILL — Clopidogrel Bisulfate Tab 75 MG (Base Equiv): ORAL | 30 days supply | Qty: 30 | Fill #1 | Status: AC

## 2024-07-01 MED FILL — Valsartan Tab 40 MG: ORAL | 30 days supply | Qty: 30 | Fill #1 | Status: AC

## 2024-07-01 MED FILL — Metoprolol Succinate Tab ER 24HR 25 MG (Tartrate Equiv): ORAL | 30 days supply | Qty: 30 | Fill #0 | Status: AC

## 2024-07-01 MED FILL — Atorvastatin Calcium Tab 80 MG (Base Equivalent): ORAL | 30 days supply | Qty: 30 | Fill #0 | Status: AC

## 2024-07-01 NOTE — Telephone Encounter (Signed)
 Pt has upcoming appt- refilling until appt  Requested Prescriptions  Pending Prescriptions Disp Refills   atorvastatin  (LIPITOR ) 80 MG tablet 30 tablet 0    Sig: Take 1 tablet (80 mg total) by mouth daily at 6 PM.     Cardiovascular:  Antilipid - Statins Failed - 07/01/2024  3:32 PM      Failed - Lipid Panel in normal range within the last 12 months    Cholesterol, Total  Date Value Ref Range Status  10/07/2023 179 100 - 199 mg/dL Final   LDL Chol Calc (NIH)  Date Value Ref Range Status  10/07/2023 114 (H) 0 - 99 mg/dL Final   HDL  Date Value Ref Range Status  10/07/2023 47 >39 mg/dL Final   Triglycerides  Date Value Ref Range Status  10/07/2023 96 0 - 149 mg/dL Final         Passed - Patient is not pregnant      Passed - Valid encounter within last 12 months    Recent Outpatient Visits           1 month ago Primary osteoarthritis of right knee   [place] [facility] at [facility] Al[facility], Selinda PARAS, MD   1 month ago History of CVA with residual deficit   [facility] at [facility], Selinda PARAS, MD       Future Appointments             In 3 weeks Alvia Selinda PARAS, MD [facility] at [facility], [phone] [place]             metoprolol  succinate (TOPROL -XL) 25 MG 24 hr tablet 30 tablet 0    Sig: Take 1 tablet (25 mg total) by mouth daily. Dose change     Cardiovascular:  Beta Blockers Passed - 07/01/2024  3:32 PM      Passed - Last BP in normal range    BP Readings from Last 1 Encounters:  05/30/24 116/74         Passed - Last Heart Rate in normal range    Pulse Readings from Last 1 Encounters:  05/30/24 79         Passed - Valid encounter within last 6 months    Recent Outpatient Visits           1 month ago Primary osteoarthritis of right knee   [place] [facility] at [facility] Al[facility], Selinda PARAS, MD   1 month ago History  of CVA with residual deficit   [facility] at [facility], Selinda PARAS, MD       Future Appointments             In 3 weeks Alvia, Selinda PARAS, MD [facility] at [facility], [phone] [place]

## 2024-07-08 ENCOUNTER — Other Ambulatory Visit: Payer: Self-pay

## 2024-07-08 ENCOUNTER — Encounter: Payer: Self-pay | Admitting: Family Medicine

## 2024-07-08 ENCOUNTER — Ambulatory Visit (INDEPENDENT_AMBULATORY_CARE_PROVIDER_SITE_OTHER): Admitting: Family Medicine

## 2024-07-08 VITALS — BP 130/98 | HR 78 | Ht 76.0 in | Wt 284.0 lb

## 2024-07-08 DIAGNOSIS — M1711 Unilateral primary osteoarthritis, right knee: Secondary | ICD-10-CM | POA: Diagnosis not present

## 2024-07-08 DIAGNOSIS — I5020 Unspecified systolic (congestive) heart failure: Secondary | ICD-10-CM | POA: Diagnosis not present

## 2024-07-08 DIAGNOSIS — G4709 Other insomnia: Secondary | ICD-10-CM | POA: Diagnosis not present

## 2024-07-08 DIAGNOSIS — F3289 Other specified depressive episodes: Secondary | ICD-10-CM | POA: Diagnosis not present

## 2024-07-08 MED ORDER — TRAZODONE HCL 50 MG PO TABS
25.0000 mg | ORAL_TABLET | Freq: Every evening | ORAL | 0 refills | Status: DC | PRN
  Filled 2024-07-08: qty 60, 30d supply, fill #0

## 2024-07-08 NOTE — Progress Notes (Signed)
 Primary Care / Sports Medicine Office Visit  Patient Information:  Patient ID: Ricardo Yang, male DOB: 21-Apr-1976 Age: 48 y.o. MRN: [id number]   Ricardo Yang is a pleasant 48 y.o. male presenting with the following:  Chief Complaint  Patient presents with   Medication Problem    Patient would like to discuss adding some medication to the cymbalta  because he is having difficulty sleeping everyday.      Vitals:   07/08/24 1039  BP: (!) 130/98  Pulse: 78  SpO2: 98%   Vitals:   07/08/24 1039  Weight: 284 lb (128.8 kg)  Height: 6' 4 (1.93 m)   Body mass index is 34.57 kg/m.  No results found.   Discussed the use of AI scribe software for clinical note transcription with the patient, who gave verbal consent to proceed.   Independent interpretation of notes and tests performed by another provider:   None  Procedures performed:   None  Pertinent History, Exam, Impression, and Recommendations:   Problem List Items Addressed This Visit     Depression   Mood disturbance - Cymbalta  prescribed for mood and pain management - Improvement in mood symptoms since starting Cymbalta  - Worsened mood after discontinuing Cymbalta  for three days due to insomnia - No periods of depression lasting two weeks or more on six occasions since childhood - No significant mood swings or periods of unusual energy without external influences - History of illicit drug use, possibly as self-medication for mood symptoms  Major depressive disorder Partial response to Cymbalta  with improved PHQ scores. Increased dose led to insomnia and potential mood instability. Discussed possibility of underlying bipolar disorder, but current screening negative. - Continue Cymbalta  60 mg daily in the morning. - Monitor for mood changes and side effects, adjust treatment if necessary.      Relevant Medications   traZODone  (DESYREL ) 50 MG tablet   HFrEF (heart failure with reduced ejection fraction) (HCC)    Physical Exam  GENERAL: Patient well-appearing, in no acute distress. NECK: Supple, no jugular venous distension, no lymphadenopathy. CHEST / LUNGS: Lungs clear to auscultation bilaterally. No wheezes, rales, or rhonchi. CARDIOVASCULAR: Regular rate and rhythm. Normal S1 and S2. No murmurs, rubs, or gallops. EXTREMITIES: No edema in the ankles. No clubbing or cyanosis. Peripheral pulses 2+ and symmetric.  Heart failure management Heart failure with concerns about fluid retention and weight fluctuations. Concern for possible fluid overload based on recent weight gain, but no current signs of pulmonary congestion. - Contact heart failure clinic to schedule cardiology follow-up. - Monitor for new or worsening symptoms, including weight changes, swelling, or breathing difficulties, and seek medical attention if he occurs.      Insomnia   Insomnia - Onset of insomnia after Cymbalta  dose increased to 60 mg - Inability to sleep for three to four days following dose increase  Insomnia Insomnia exacerbated by increased Cymbalta  dose. Trazodone  introduced as a sleep aid with flexible dosing strategy. - Prescribe trazodone  50 mg, instruct to take 0.5 to 2 tablets as needed for sleep, adjusting dose based on response and side effects. - Monitor sleep patterns and adjust trazodone  dose accordingly.      Relevant Medications   traZODone  (DESYREL ) 50 MG tablet   Osteoarthritis of right knee - Primary   Knee pain - Ongoing knee pain - Previous knee injections; first injection provided significant relief, most recent injection provided only partial relief - Currently undergoing physical therapy, including KT taping and knee brace -  KT tape provides approximately 30% improvement in mobility  Right knee pain Chronic pain with suboptimal response to cortisone injection suggests possible extra-articular sources or worsening condition. Differential includes arthritis and potential meniscal tear. -  Order MRI of right knee to assess cartilage wear and evaluate for meniscal tear or other causes. - Continue physical therapy and KT taping, extend therapy if improvement continues. - Use knee brace as needed for support.      Relevant Orders   MR Knee Right Wo Contrast     Orders & Medications Medications:  Meds ordered this encounter  Medications   traZODone  (DESYREL ) 50 MG tablet    Sig: Take 0.5-2 tablets (25-100 mg total) by mouth at bedtime as needed.    Dispense:  60 tablet    Refill:  0   Orders Placed This Encounter  Procedures   MR Knee Right Wo Contrast     No follow-ups on file.     Selinda JINNY Ku, MD, [facility]   Primary Care Sports Medicine Primary Care and Sports Medicine at [facility]

## 2024-07-11 ENCOUNTER — Telehealth: Payer: Self-pay | Admitting: Family

## 2024-07-11 NOTE — Telephone Encounter (Signed)
 Called to confirm/remind patient of their appointment at the [facility] on 07/12/24.   Appointment:   [] Confirmed  [x] Left mess   [] No answer/No voice mail  [] VM Full/unable to leave message  [] Phone not in service  Patient reminded to bring all medications and/or complete list.  Confirmed patient has transportation. Gave directions, instructed to utilize valet parking.

## 2024-07-12 ENCOUNTER — Encounter: Payer: Self-pay | Admitting: Family

## 2024-07-12 ENCOUNTER — Other Ambulatory Visit
Admission: RE | Admit: 2024-07-12 | Discharge: 2024-07-12 | Disposition: A | Discharge status: Home / Self Care | Source: Ambulatory Visit | Attending: Family | Admitting: Family

## 2024-07-12 ENCOUNTER — Ambulatory Visit: Admitting: Family

## 2024-07-12 ENCOUNTER — Ambulatory Visit: Payer: Self-pay | Admitting: Family

## 2024-07-12 VITALS — BP 129/90 | HR 73 | Wt 284.0 lb

## 2024-07-12 DIAGNOSIS — Z7982 Long term (current) use of aspirin: Secondary | ICD-10-CM | POA: Diagnosis not present

## 2024-07-12 DIAGNOSIS — I5020 Unspecified systolic (congestive) heart failure: Secondary | ICD-10-CM

## 2024-07-12 DIAGNOSIS — G4730 Sleep apnea, unspecified: Secondary | ICD-10-CM | POA: Diagnosis not present

## 2024-07-12 DIAGNOSIS — I69351 Hemiplegia and hemiparesis following cerebral infarction affecting right dominant side: Secondary | ICD-10-CM | POA: Insufficient documentation

## 2024-07-12 DIAGNOSIS — I5022 Chronic systolic (congestive) heart failure: Secondary | ICD-10-CM | POA: Diagnosis not present

## 2024-07-12 DIAGNOSIS — I252 Old myocardial infarction: Secondary | ICD-10-CM | POA: Insufficient documentation

## 2024-07-12 DIAGNOSIS — Z9861 Coronary angioplasty status: Secondary | ICD-10-CM

## 2024-07-12 DIAGNOSIS — Z955 Presence of coronary angioplasty implant and graft: Secondary | ICD-10-CM | POA: Diagnosis not present

## 2024-07-12 DIAGNOSIS — I2510 Atherosclerotic heart disease of native coronary artery without angina pectoris: Secondary | ICD-10-CM

## 2024-07-12 DIAGNOSIS — F17210 Nicotine dependence, cigarettes, uncomplicated: Secondary | ICD-10-CM | POA: Insufficient documentation

## 2024-07-12 DIAGNOSIS — E785 Hyperlipidemia, unspecified: Secondary | ICD-10-CM | POA: Insufficient documentation

## 2024-07-12 DIAGNOSIS — Z7902 Long term (current) use of antithrombotics/antiplatelets: Secondary | ICD-10-CM | POA: Diagnosis not present

## 2024-07-12 DIAGNOSIS — I10 Essential (primary) hypertension: Secondary | ICD-10-CM | POA: Diagnosis not present

## 2024-07-12 DIAGNOSIS — Z79899 Other long term (current) drug therapy: Secondary | ICD-10-CM | POA: Insufficient documentation

## 2024-07-12 DIAGNOSIS — I110 Hypertensive heart disease with heart failure: Secondary | ICD-10-CM | POA: Diagnosis not present

## 2024-07-12 DIAGNOSIS — R5383 Other fatigue: Secondary | ICD-10-CM | POA: Insufficient documentation

## 2024-07-12 DIAGNOSIS — I6930 Unspecified sequelae of cerebral infarction: Secondary | ICD-10-CM | POA: Diagnosis not present

## 2024-07-12 DIAGNOSIS — G35 Multiple sclerosis: Secondary | ICD-10-CM | POA: Insufficient documentation

## 2024-07-12 DIAGNOSIS — Z5986 Financial insecurity: Secondary | ICD-10-CM | POA: Insufficient documentation

## 2024-07-12 DIAGNOSIS — E782 Mixed hyperlipidemia: Secondary | ICD-10-CM

## 2024-07-12 DIAGNOSIS — F1911 Other psychoactive substance abuse, in remission: Secondary | ICD-10-CM

## 2024-07-12 LAB — BASIC METABOLIC PANEL WITH GFR
Anion gap: 9 (ref 5–15)
BUN: 11 mg/dL (ref 6–20)
CO2: 24 mmol/L (ref 22–32)
Calcium: 8.9 mg/dL (ref 8.9–10.3)
Chloride: 102 mmol/L (ref 98–111)
Creatinine, Ser: 0.84 mg/dL (ref 0.61–1.24)
GFR, Estimated: >60 mL/min (ref >60)
Glucose, Bld: 98 mg/dL (ref 70–99)
Potassium: 4.3 mmol/L (ref 3.5–5.1)
Sodium: 135 mmol/L (ref 135–145)

## 2024-07-12 LAB — LIPID PANEL
Cholesterol: 104 mg/dL (ref 0–200)
HDL: 40 mg/dL — ABNORMAL LOW (ref >40)
LDL Cholesterol: 47 mg/dL (ref 0–99)
Total CHOL/HDL Ratio: 2.6 ratio
Triglycerides: 86 mg/dL (ref <150)
VLDL: 17 mg/dL (ref 0–40)

## 2024-07-12 NOTE — Progress Notes (Signed)
 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   LHC 06/22: Mid RCA lesion is 50% stenosed. 2nd Mrg lesion is 100% stenosed. Ost LAD to Prox LAD lesion is 20% stenosed.  1.  Non-ST elevation myocardial infarction 2.  Patent stent proximal LAD 3.  Distal occlusion small to moderate caliber OM 2 4.  Mild to moderate reduced left ventricular function with apical dyskinesis  Admitted 10/04/22 due to  acute onset of aphasia and right-sided weakness with leftward gaze. MRI showed acute infarct in the left ACA, left MCA and likely of the right PCA territory with a large infarct involving the left postcentral gyrus. Transferred to rehab. TEE 10/08/22: EF 20-25%, no atrial thrombus, mild MR. Echo 10/05/22: EF <20%, mild LAE/RAE and mild MR  Was in the ED 11/27/22 due to right hand swelling. Ultrasound negative for DVT.   Was in the ER 05/19/24 following a mechanical fall. Unclear of what surrounded the fall. CT negative for acute stroke. Shoulder xray was negative.   He presents today for a HF f/u visit with a chief complaint of fatigue. Has dizziness with sudden position changes, chronic right arm/ hand pain and weakness since stroke 12/23.   No smoking X 2 weeks. Denies alcohol/ drug use.   ROS: All systems negative except as listed in HPI, PMH and Problem List.  SH:  Social History   Socioeconomic History   Marital status: Single    Spouse name: Not on file   Number of children: Not on file   Years of education: Not on file   Highest education level: Not on file  Occupational History   Not on file  Tobacco Use   Smoking status: Some Days    Current packs/day: 0.25    Average  packs/day: 0.3 packs/day for 15.0 years (3.8 ttl pk-yrs)    Types: Cigarettes   Smokeless tobacco: Never   Tobacco comments:    Started as a teenager  Vaping Use   Vaping status: Never Used  Substance and Sexual Activity   Alcohol use: Not Currently    Alcohol/week: 1.0 standard drink of alcohol    Types: 1 Shots of liquor per week   Drug use: Yes    Frequency: 1.0 times per week    Types: Cocaine, Marijuana    Comment: 01/26/23 current marijuana no cocaine   Sexual activity: Not Currently    Partners: Female    Birth control/protection: Abstinence, Condom  Other Topics Concern   Not on file  Social History Narrative   Not on file   Social Drivers of Health   Financial Resource Strain: High Risk (06/02/2024)   Received [facility]   Overall Financial Resource Strain (CARDIA)    Difficulty of Paying Living Expenses: Hard  Food Insecurity: Food Insecurity Present (06/02/2024)   Received from [facility]   Hunger Vital Sign    Within the past 12 months, you worried that your food would run out before you got the money to buy more.: Sometimes true    Within the past 12 months, the food you bought just didn't last and you didn't have money to get more.: Sometimes true  Transportation Needs:  No Transportation Needs (06/02/2024)   Received from [facility] - Transportation    In the past 12 months, has lack of transportation kept you from medical appointments or from getting medications?: No    Lack of Transportation (Non-Medical): No  Physical Activity: Not on file  Stress: Not on file  Social Connections: Not on file  Intimate Partner Violence: Not At Risk (01/01/2023)   Humiliation, Afraid, Rape, and Kick questionnaire    Fear of Current or Ex-Partner: No    Emotionally Abused: No    Physically Abused: No    Sexually Abused: No    FH:  Family History  Problem Relation Age of Onset   Miscarriages / Stillbirths  Mother    Heart disease Father    Hypertension Father    Depression Daughter    Alcohol abuse Maternal Grandfather    Cancer Maternal Grandfather    Depression Maternal Grandfather    Hypertension Maternal Grandfather    Stroke Maternal Grandfather    Cancer Maternal Grandmother    Hypertension Maternal Grandmother     Past Medical History:  Diagnosis Date   Acute CVA (cerebrovascular accident) (HCC) 10/04/2022   Acute ST elevation myocardial infarction (STEMI) involving left anterior descending (LAD) coronary artery (HCC) 08/06/2019   Anxiety 09/2022   After stroke   CAD (coronary artery disease) 10/04/2022   Cerebrovascular accident (CVA) (HCC) 10/07/2022   Chest pain    CHF (congestive heart failure) (HCC)    Cocaine abuse (HCC)    Cocaine use 10/06/2022   Depression December 2023   When stroke happened   Elevated troponin    Encounter for imaging to screen for metal prior to MRI 10/07/2022   Healthcare maintenance 10/07/2023   Heavy smoker    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 question possible MS   NSTEMI (non-ST elevated myocardial infarction) (HCC) 05/25/2015   Obesity    Polysubstance abuse (HCC)    a. tobacco, cocaine, crack, ecstasy, pills, anything but injections   Primary hypertension 10/05/2022   Sleep apnea    Always   STEMI (ST elevation myocardial infarction) (HCC) 08/06/2019    Current Outpatient Medications  Medication Sig Dispense Refill   aspirin  81 MG chewable tablet Chew 1 tablet (81 mg total) by mouth daily. 30 tablet 5   atorvastatin  (LIPITOR ) 80 MG tablet Take 1 tablet (80 mg total) by mouth daily at 6 PM. 30 tablet 0   clopidogrel  (PLAVIX ) 75 MG tablet Take 1 tablet (75 mg total) by mouth daily with breakfast. 30 tablet 5   dapagliflozin  propanediol (FARXIGA ) 10 MG TABS tablet Take 1 tablet (10 mg total) by mouth daily. NEEDS FOLLOW UP APPOINTMENT FOR MORE REFILLS 90 tablet 0   DULoxetine   (CYMBALTA ) 60 MG capsule Take 1 capsule (60 mg total) by mouth daily. (Patient taking differently: Take 60 mg by mouth daily. Every other day) 90 capsule 0   metoprolol  succinate (TOPROL -XL) 25 MG 24 hr tablet Take 1 tablet (25 mg total) by mouth daily. Dose change 30 tablet 0   traZODone  (DESYREL ) 50 MG tablet Take 0.5-2 tablets (25-100 mg total) by mouth at bedtime as needed. 60 tablet 0   valsartan  (DIOVAN ) 40 MG tablet Take 1 tablet (40 mg total) by mouth daily. 30 tablet 5   No current facility-administered medications for this visit.   Vitals:   07/12/24 1046  BP: (!) 129/90  Pulse: 73  SpO2: 98%  Weight: 284 lb (128.8 kg)   Wt Readings from Last 3 Encounters:  07/12/24 284 lb (128.8 kg)  07/08/24 284 lb (128.8 kg)  05/30/24 286 lb (129.7 kg)   Lab Results  Component Value Date   CREATININE 0.79 05/19/2024   CREATININE 0.84 10/07/2023   CREATININE 0.97 10/20/2022     PHYSICAL EXAM:  General: Well appearing.  Cor: No JVD. Regular rhythm, rate.  Lungs: clear Abdomen: soft, nontender, nondistended. Extremities: no edema Neuro:. Affect pleasant. Right arm/ leg weak   ECG: not done   ASSESSMENT & PLAN:  1: Ischemic heart failure with reduced ejection fraction- - NYHA class II - euvolemic - weight up 6 pounds from last visit here >1 year ago - TEE 10/08/22: EF 20-25%, no atrial thrombus, mild MR. Echo 10/05/22: EF <20%, mild LAE/RAE and mild MR - will get echo updated - continue farxiga  10mg  daily - continue metoprolol  succinate 25mg  daily - continue valsartan  40mg  daily - begin spironolactone 25mg  daily. BMET today, 1 week and at next OV  2: HTN- - BP 129/90 - saw PCP [place]) 09/25 - BMP 05/19/24 reviewed: sodium 138, potassium 3.8, creatinine 0.79 & GFR >60 - BMET today  3: CAD- - saw cardiology ([place])) 10/24. Referral placed today for him to schedule an appointment - continue clopidogrel  75mg  daily - continue ASA 81mg  daily LHC 06/22: Mid RCA  lesion is 50% stenosed. 2nd Mrg lesion is 100% stenosed. Ost LAD to Prox LAD lesion is 20% stenosed.  Non-ST elevation myocardial infarction Patent stent proximal LAD  Distal occlusion small to moderate caliber OM 2  Mild to moderate reduced left ventricular function with apical dyskinesis  4: Stroke- - unable to grasp things small with his right hand (dominant hand is his right) - saw neurology [place]) 08/25  5: Substance use- - uses marijuana frequently - has not smoked anything in the last 2 weeks. Congratulated on that and continued cessation discussed.  - denies any cocaine use since the week before his stroke 12/23  6 Hyperlipidemia- - continue atorvastatin  80mg  daily - LDL 10/07/23 was 114 - lipid panel today   Return in 1 week, sooner if needed.   I spent 37 minutes reviewing records, interviewing/ examing patient and managing plan/ orders.   Ellouise DELENA Class, FNP 07/12/24

## 2024-07-12 NOTE — Patient Instructions (Addendum)
 Medication Changes:  Start taking Spironolactone 25 MG once daily    Lab Work:  Go over to the [facility]ICAL MALL. Go pass the gift shop and have your blood work completed.  We will only call you if the results are abnormal or if the provider would like to make medication changes.  No news is good news.   Procedure:  Please have your echo completed. You will check in for this in the [facility] at [facility] on the LOWER LEVEL. You have to arrive 15 MINS EARLY for preparation, otherwise you will have to reschedule. They will call you to get this scheduled. However, if you'd like to reach out to them instead, their number is [phone].   Referral:  We have placed a referral today to General Cardiology to resume your care with them. They should reach out to you within 1-2 weeks. If they do not, please reach out to them. Their information is below on this AVS. This is also the same place that you will be have your echo completed.   Follow-Up in: 1 week with Ellouise Class, FNP.   Thank you for choosing [facility].    At the [facility], you and your health needs are our priority. We have a designated team specialized in the treatment of Heart Failure. This Care Team includes your primary Heart Failure Specialized Cardiologist (physician), Advanced Practice Providers (APPs- Physician Assistants and Nurse Practitioners), and Pharmacist who all work together to provide you with the care you need, when you need it.   You may see any of the following providers on your designated Care Team at your next follow up:  Dr. Toribio Fuel Dr. Ezra Shuck Dr. Ria Commander Dr. Morene Brownie Ellouise Class, FNP Jaun Bash, RPH-CPP  Please be sure to bring in all your medications bottles to every appointment.   Need to Contact Us :  If you have any questions or concerns before your next appointment please send us  a message  through [place] or call our office at [phone].    TO LEAVE A MESSAGE FOR THE NURSE SELECT OPTION 2, PLEASE LEAVE A MESSAGE INCLUDING: YOUR NAME DATE OF BIRTH CALL BACK NUMBER REASON FOR CALL**this is important as we prioritize the call backs  YOU WILL RECEIVE A CALL BACK THE SAME DAY AS LONG AS YOU CALL BEFORE 4:00 PM

## 2024-07-13 NOTE — Assessment & Plan Note (Signed)
 Knee pain - Ongoing knee pain - Previous knee injections; first injection provided significant relief, most recent injection provided only partial relief - Currently undergoing physical therapy, including KT taping and knee brace - KT tape provides approximately 30% improvement in mobility  Right knee pain Chronic pain with suboptimal response to cortisone injection suggests possible extra-articular sources or worsening condition. Differential includes arthritis and potential meniscal tear. - Order MRI of right knee to assess cartilage wear and evaluate for meniscal tear or other causes. - Continue physical therapy and KT taping, extend therapy if improvement continues. - Use knee brace as needed for support.

## 2024-07-13 NOTE — Assessment & Plan Note (Signed)
 Insomnia - Onset of insomnia after Cymbalta  dose increased to 60 mg - Inability to sleep for three to four days following dose increase  Insomnia Insomnia exacerbated by increased Cymbalta  dose. Trazodone  introduced as a sleep aid with flexible dosing strategy. - Prescribe trazodone  50 mg, instruct to take 0.5 to 2 tablets as needed for sleep, adjusting dose based on response and side effects. - Monitor sleep patterns and adjust trazodone  dose accordingly.

## 2024-07-13 NOTE — Assessment & Plan Note (Signed)
 Mood disturbance - Cymbalta  prescribed for mood and pain management - Improvement in mood symptoms since starting Cymbalta  - Worsened mood after discontinuing Cymbalta  for three days due to insomnia - No periods of depression lasting two weeks or more on six occasions since childhood - No significant mood swings or periods of unusual energy without external influences - History of illicit drug use, possibly as self-medication for mood symptoms  Major depressive disorder Partial response to Cymbalta  with improved PHQ scores. Increased dose led to insomnia and potential mood instability. Discussed possibility of underlying bipolar disorder, but current screening negative. - Continue Cymbalta  60 mg daily in the morning. - Monitor for mood changes and side effects, adjust treatment if necessary.

## 2024-07-13 NOTE — Patient Instructions (Signed)
 Patient Plan  Depression and Insomnia  - Continue taking Cymbalta  60 mg daily in the morning. - Take trazodone  50 mg for sleep as needed; you may take half to two tablets based on your response and side effects. - Monitor your mood and sleep patterns, and adjust trazodone  dose as needed. - Report any new or worsening mood changes or side effects.  Heart Failure  - Contact the heart failure clinic to schedule a cardiology follow-up. - Monitor for new or worsening symptoms, including weight gain, swelling, or trouble breathing.  Right Knee Pain  - Complete the MRI of your right knee as ordered. - Continue physical therapy and KT taping; extend therapy if you continue to improve. - Use your knee brace as needed for support.  Red flags - seek care right away if you notice:  - Sudden or severe shortness of breath, chest pain, or rapid weight gain - Severe mood changes, thoughts of self-harm, or inability to cope - Severe knee pain, swelling, or inability to move the knee - Any new or concerning symptoms

## 2024-07-13 NOTE — Assessment & Plan Note (Signed)
 Physical Exam  GENERAL: Patient well-appearing, in no acute distress. NECK: Supple, no jugular venous distension, no lymphadenopathy. CHEST / LUNGS: Lungs clear to auscultation bilaterally. No wheezes, rales, or rhonchi. CARDIOVASCULAR: Regular rate and rhythm. Normal S1 and S2. No murmurs, rubs, or gallops. EXTREMITIES: No edema in the ankles. No clubbing or cyanosis. Peripheral pulses 2+ and symmetric.  Heart failure management Heart failure with concerns about fluid retention and weight fluctuations. Concern for possible fluid overload based on recent weight gain, but no current signs of pulmonary congestion. - Contact heart failure clinic to schedule cardiology follow-up. - Monitor for new or worsening symptoms, including weight changes, swelling, or breathing difficulties, and seek medical attention if he occurs.

## 2024-07-14 ENCOUNTER — Ambulatory Visit
Admission: RE | Admit: 2024-07-14 | Discharge: 2024-07-14 | Disposition: A | Discharge status: Home / Self Care | Source: Ambulatory Visit | Attending: Family Medicine | Admitting: Family Medicine

## 2024-07-14 DIAGNOSIS — M1711 Unilateral primary osteoarthritis, right knee: Secondary | ICD-10-CM | POA: Insufficient documentation

## 2024-07-18 ENCOUNTER — Telehealth: Payer: Self-pay | Admitting: Family

## 2024-07-18 NOTE — Telephone Encounter (Signed)
 Called to confirm/remind patient of their appointment at the [facility] on 07/19/24.   Appointment:   [x] Confirmed  [] Left mess   [] No answer/No voice mail  [] VM Full/unable to leave message  [] Phone not in service  Patient reminded to bring all medications and/or complete list.  Confirmed patient has transportation. Gave directions, instructed to utilize valet parking.

## 2024-07-19 ENCOUNTER — Other Ambulatory Visit: Payer: Self-pay

## 2024-07-19 ENCOUNTER — Ambulatory Visit: Attending: Family | Admitting: Family

## 2024-07-19 ENCOUNTER — Encounter: Payer: Self-pay | Admitting: Family

## 2024-07-19 VITALS — BP 121/89 | HR 67 | Wt 288.0 lb

## 2024-07-19 DIAGNOSIS — F1290 Cannabis use, unspecified, uncomplicated: Secondary | ICD-10-CM | POA: Diagnosis not present

## 2024-07-19 DIAGNOSIS — Z955 Presence of coronary angioplasty implant and graft: Secondary | ICD-10-CM | POA: Diagnosis not present

## 2024-07-19 DIAGNOSIS — F17210 Nicotine dependence, cigarettes, uncomplicated: Secondary | ICD-10-CM | POA: Diagnosis not present

## 2024-07-19 DIAGNOSIS — Z7902 Long term (current) use of antithrombotics/antiplatelets: Secondary | ICD-10-CM | POA: Diagnosis not present

## 2024-07-19 DIAGNOSIS — I69351 Hemiplegia and hemiparesis following cerebral infarction affecting right dominant side: Secondary | ICD-10-CM | POA: Insufficient documentation

## 2024-07-19 DIAGNOSIS — F419 Anxiety disorder, unspecified: Secondary | ICD-10-CM | POA: Diagnosis not present

## 2024-07-19 DIAGNOSIS — I252 Old myocardial infarction: Secondary | ICD-10-CM | POA: Diagnosis not present

## 2024-07-19 DIAGNOSIS — Z79899 Other long term (current) drug therapy: Secondary | ICD-10-CM | POA: Diagnosis not present

## 2024-07-19 DIAGNOSIS — G35 Multiple sclerosis: Secondary | ICD-10-CM | POA: Diagnosis not present

## 2024-07-19 DIAGNOSIS — G4730 Sleep apnea, unspecified: Secondary | ICD-10-CM | POA: Diagnosis not present

## 2024-07-19 DIAGNOSIS — G8929 Other chronic pain: Secondary | ICD-10-CM | POA: Diagnosis not present

## 2024-07-19 DIAGNOSIS — R42 Dizziness and giddiness: Secondary | ICD-10-CM | POA: Diagnosis not present

## 2024-07-19 DIAGNOSIS — Z5941 Food insecurity: Secondary | ICD-10-CM | POA: Insufficient documentation

## 2024-07-19 DIAGNOSIS — I69359 Hemiplegia and hemiparesis following cerebral infarction affecting unspecified side: Secondary | ICD-10-CM

## 2024-07-19 DIAGNOSIS — F32A Depression, unspecified: Secondary | ICD-10-CM | POA: Insufficient documentation

## 2024-07-19 DIAGNOSIS — M79641 Pain in right hand: Secondary | ICD-10-CM | POA: Insufficient documentation

## 2024-07-19 DIAGNOSIS — I2510 Atherosclerotic heart disease of native coronary artery without angina pectoris: Secondary | ICD-10-CM | POA: Diagnosis not present

## 2024-07-19 DIAGNOSIS — F1911 Other psychoactive substance abuse, in remission: Secondary | ICD-10-CM

## 2024-07-19 DIAGNOSIS — I110 Hypertensive heart disease with heart failure: Secondary | ICD-10-CM | POA: Diagnosis not present

## 2024-07-19 DIAGNOSIS — M79601 Pain in right arm: Secondary | ICD-10-CM | POA: Insufficient documentation

## 2024-07-19 DIAGNOSIS — I5022 Chronic systolic (congestive) heart failure: Secondary | ICD-10-CM | POA: Diagnosis not present

## 2024-07-19 DIAGNOSIS — Z7982 Long term (current) use of aspirin: Secondary | ICD-10-CM | POA: Diagnosis not present

## 2024-07-19 DIAGNOSIS — I10 Essential (primary) hypertension: Secondary | ICD-10-CM

## 2024-07-19 DIAGNOSIS — Z5986 Financial insecurity: Secondary | ICD-10-CM | POA: Insufficient documentation

## 2024-07-19 DIAGNOSIS — E782 Mixed hyperlipidemia: Secondary | ICD-10-CM

## 2024-07-19 DIAGNOSIS — I5020 Unspecified systolic (congestive) heart failure: Secondary | ICD-10-CM | POA: Diagnosis not present

## 2024-07-19 DIAGNOSIS — I255 Ischemic cardiomyopathy: Secondary | ICD-10-CM

## 2024-07-19 DIAGNOSIS — E785 Hyperlipidemia, unspecified: Secondary | ICD-10-CM | POA: Insufficient documentation

## 2024-07-19 DIAGNOSIS — Z9861 Coronary angioplasty status: Secondary | ICD-10-CM

## 2024-07-19 MED ORDER — SPIRONOLACTONE 25 MG PO TABS
25.0000 mg | ORAL_TABLET | Freq: Every day | ORAL | 3 refills | Status: AC
  Filled 2024-07-19: qty 90, 90d supply, fill #0
  Filled 2024-10-13: qty 90, 90d supply, fill #1

## 2024-07-19 NOTE — Progress Notes (Signed)
 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   LHC 06/22: Mid RCA lesion is 50% stenosed. 2nd Mrg lesion is 100% stenosed. Ost LAD to Prox LAD lesion is 20% stenosed.  1.  Non-ST elevation myocardial infarction 2.  Patent stent proximal LAD 3.  Distal occlusion small to moderate caliber OM 2 4.  Mild to moderate reduced left ventricular function with apical dyskinesis  Admitted 10/04/22 due to  acute onset of aphasia and right-sided weakness with leftward gaze. MRI showed acute infarct in the left ACA, left MCA and likely of the right PCA territory with a large infarct involving the left postcentral gyrus. Transferred to rehab. TEE 10/08/22: EF 20-25%, no atrial thrombus, mild Ricardo. Echo 10/05/22: EF <20%, mild LAE/RAE and mild Ricardo  Was in the ED 11/27/22 due to right hand swelling. Ultrasound negative for DVT.   Was in the ER 05/19/24 following a mechanical fall. Unclear of what surrounded the fall. CT negative for acute stroke. Shoulder xray was negative.   Echo 07/12/24, awaiting release of findings  He presents today for a HF f/u visit with a chief complaint fatigue. Denies shortness of breath but endorses he is occasionally winded. Further denies chest pain, palpitations, abdominal distention, swelling. No sleep distrubances, sleep well with 2 pillows, denies orthopnea and PND. Occaaionally has dizziness with sudden position changes. Has chronic right arm/ hand pain and weakness since stroke 12/23, Uses Tylenol  1-2 times a week for pain relief.   Has not started taking spironolactone , states he was unsure he had to start medication. Will start taking as prescribed.   External  stressors with parenthood, has 5 children. Uses marijuana daily and uses cigarettes 1-2 days. Ocassional alcohol use, mainly ETOH.   Diet high is saturated fat (hot dogs and burgers), no pork , and NAS. Drinking 3 bottles/day of water, 3-4 cups of V8, and 2-3 cups of soda daily. Weighing daily has noted weight is up.    ROS: All systems negative except as listed in HPI, PMH and Problem List.  SH:  Social History   Socioeconomic History   Marital status: Single    Spouse name: Not on file   Number of children: Not on file   Years of education: Not on file   Highest education level: Not on file  Occupational History   Not on file  Tobacco Use   Smoking status: Some Days    Current packs/day: 0.25    Average packs/day: 0.3 packs/day for 15.0 years (3.8 ttl pk-yrs)    Types: Cigarettes   Smokeless tobacco: Never   Tobacco comments:    Started as a teenager  Vaping Use   Vaping status: Never Used  Substance and Sexual Activity   Alcohol use: Not Currently    Alcohol/week: 1.0 standard drink of alcohol    Types: 1 Shots of liquor per week   Drug use: Yes    Frequency: 1.0 times per week    Types: Cocaine, Marijuana    Comment: 01/26/23 current marijuana no cocaine   Sexual activity: Not Currently    Partners: Female    Birth control/protection: Abstinence, Condom  Other Topics Concern   Not on file  Social History  Narrative   Not on file   Social Drivers of Health   Financial Resource Strain: High Risk (06/02/2024)   Received from [facility]   Overall Financial Resource Strain (CARDIA)    Difficulty of Paying Living Expenses: Hard  Food Insecurity: Food Insecurity Present (06/02/2024)   Received from [facility]   Hunger Vital Sign    Within the past 12 months, you worried that your food would run out before you got the money to buy more.: Sometimes true    Within the past 12 months, the food you bought just didn't last and you didn't  have money to get more.: Sometimes true  Transportation Needs: No Transportation Needs (06/02/2024)   Received from [facility] - Transportation    In the past 12 months, has lack of transportation kept you from medical appointments or from getting medications?: No    Lack of Transportation (Non-Medical): No  Physical Activity: Not on file  Stress: Not on file  Social Connections: Not on file  Intimate Partner Violence: Not At Risk (01/01/2023)   Humiliation, Afraid, Rape, and Kick questionnaire    Fear of Current or Ex-Partner: No    Emotionally Abused: No    Physically Abused: No    Sexually Abused: No    FH:  Family History  Problem Relation Age of Onset   Miscarriages / Stillbirths Mother    Heart disease Father    Hypertension Father    Depression Daughter    Alcohol abuse Maternal Grandfather    Cancer Maternal Grandfather    Depression Maternal Grandfather    Hypertension Maternal Grandfather    Stroke Maternal Grandfather    Cancer Maternal Grandmother    Hypertension Maternal Grandmother     Past Medical History:  Diagnosis Date   Acute CVA (cerebrovascular accident) (HCC) 10/04/2022   Acute ST elevation myocardial infarction (STEMI) involving left anterior descending (LAD) coronary artery (HCC) 08/06/2019   Anxiety 09/2022   After stroke   CAD (coronary artery disease) 10/04/2022   Cerebrovascular accident (CVA) (HCC) 10/07/2022   Chest pain    CHF (congestive heart failure) (HCC)    Cocaine abuse (HCC)    Cocaine use 10/06/2022   Depression December 2023   When stroke happened   Elevated troponin    Encounter for imaging to screen for metal prior to MRI 10/07/2022   Healthcare maintenance 10/07/2023   Heavy smoker    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 question possible MS   NSTEMI (non-ST elevated myocardial infarction) (HCC) 05/25/2015   Obesity    Polysubstance abuse (HCC)    a.  tobacco, cocaine, crack, ecstasy, pills, anything but injections   Primary hypertension 10/05/2022   Sleep apnea    Always   STEMI (ST elevation myocardial infarction) (HCC) 08/06/2019    Current Outpatient Medications  Medication Sig Dispense Refill   aspirin  81 MG chewable tablet Chew 1 tablet (81 mg total) by mouth daily. 30 tablet 5   atorvastatin  (LIPITOR ) 80 MG tablet Take 1 tablet (80 mg total) by mouth daily at 6 PM. 30 tablet 0   clopidogrel  (PLAVIX ) 75 MG tablet Take 1 tablet (75 mg total) by mouth daily with breakfast. 30 tablet 5   dapagliflozin  propanediol (FARXIGA ) 10 MG TABS tablet Take 1 tablet (10 mg total) by mouth daily. NEEDS FOLLOW UP APPOINTMENT FOR MORE REFILLS 90 tablet 0   DULoxetine  (  CYMBALTA ) 60 MG capsule Take 1 capsule (60 mg total) by mouth daily. (Patient taking differently: Take 60 mg by mouth daily. Every other day) 90 capsule 0   metoprolol  succinate (TOPROL -XL) 25 MG 24 hr tablet Take 1 tablet (25 mg total) by mouth daily. Dose change 30 tablet 0   traZODone  (DESYREL ) 50 MG tablet Take 0.5-2 tablets (25-100 mg total) by mouth at bedtime as needed. 60 tablet 0   valsartan  (DIOVAN ) 40 MG tablet Take 1 tablet (40 mg total) by mouth daily. 30 tablet 5   No current facility-administered medications for this visit.   Vitals:   07/19/24 1145  BP: 121/89  Pulse: 67  SpO2: 100%  Weight: 130.6 kg   Wt Readings from Last 3 Encounters:  07/19/24 130.6 kg  07/12/24 128.8 kg  07/08/24 128.8 kg    Lab Results  Component Value Date   CREATININE 0.84 07/12/2024   CREATININE 0.79 05/19/2024   CREATININE 0.84 10/07/2023     PHYSICAL EXAM:  General: Well appearing. No acute signs of distress.  Cor: No JVD. Regular rhythm, rate.  Lungs: Clear bilaterally. Symmetrical chest expansion. No cough.  Abdomen: Soft, nontender, nondistended. Extremities: No edema.  Neuro:. AO x3. Affect pleasant. Right arm/ leg weak.    ECG: not done   ASSESSMENT &  PLAN:  1: Ischemic heart failure with reduced ejection fraction- - NYHA class II - euvolemic - weighing daily - weight up 5 pounds from last visit here 1 week ago  - advised on dietary modifications, limit salt intake 2000mg  to daily and limit intake of juice/soda  - TEE 10/08/22: EF 20-25%, no atrial thrombus, mild Ricardo. Echo 10/05/22: EF <20%, mild LAE/RAE and mild Ricardo - echo 07/12/24, awaiting release of findings - continue farxiga  10mg  daily - continue metoprolol  succinate 25mg  daily - continue valsartan  40mg  daily - start spironolactone  25mg  daily. - BMET in 1 week, next visit   2: HTN- - BP 121/89 - saw PCP Ricardo Yang) 09/25 - BMP 07/12/24 reviewed: sodium 135, potassium 4.3, creatinine 0.84 & GFR >60 - BMET in 1 week, next visit   3: CAD- - saw cardiology (Ricardo Yang)) 10/24. Referral placed today for him to schedule an appointment - continue clopidogrel  75mg  daily - continue ASA 81mg  daily LHC 06/22: Mid RCA lesion is 50% stenosed. 2nd Mrg lesion is 100% stenosed. Ost LAD to Prox LAD lesion is 20% stenosed.  Non-ST elevation myocardial infarction Patent stent proximal LAD  Distal occlusion small to moderate caliber OM 2  Mild to moderate reduced left ventricular function with apical dyskinesis  4: Stroke- - unable to grasp things small with his right hand (dominant hand is his right) - suggested use of hand brace for hand support  - saw neurology Ricardo Yang) 08/25  5: Substance use- - uses marijuana daily  - smokes 1-2 cigarettes daily  - encouraged smoking cessation - denies any cocaine use since the week before his stroke 12/23  6 Hyperlipidemia- - continue atorvastatin  80mg  daily - LDL 07/12/24 was 47   Return in 1 week, sooner if needed.    Ellouise Class, FNP / Solomon Sherman Lipuma, FNP-S 07/19/24

## 2024-07-19 NOTE — Patient Instructions (Signed)
 Medication Changes:  START Spironolactone  25mg  (1 tab) daily  Follow-Up in: Please follow up with the Advanced Heart Failure Clinic in 1 month with Ellouise Class, FNP   Thank you for choosing [facility].    At the Advanced Heart Failure Clinic, you and your health needs are our priority. We have a designated team specialized in the treatment of Heart Failure. This Care Team includes your primary Heart Failure Specialized Cardiologist (physician), Advanced Practice Providers (APPs- Physician Assistants and Nurse Practitioners), and Pharmacist who all work together to provide you with the care you need, when you need it.   You may see any of the following providers on your designated Care Team at your next follow up:  Dr. Toribio Fuel Dr. Ezra Shuck Dr. Ria Commander Dr. Morene Brownie Ellouise Class, FNP Jaun Bash, RPH-CPP  Please be sure to bring in all your medications bottles to every appointment.   Need to Contact Us :  If you have any questions or concerns before your next appointment please send us  a message through [place] or call our office at [phone].    TO LEAVE A MESSAGE FOR THE NURSE SELECT OPTION 2, PLEASE LEAVE A MESSAGE INCLUDING: YOUR NAME DATE OF BIRTH CALL BACK NUMBER REASON FOR CALL**this is important as we prioritize the call backs  YOU WILL RECEIVE A CALL BACK THE SAME DAY AS LONG AS YOU CALL BEFORE 4:00 PM

## 2024-07-20 ENCOUNTER — Telehealth: Payer: Self-pay | Admitting: Neurology

## 2024-07-20 NOTE — Telephone Encounter (Signed)
 Pt wife called to cancel appt   Pt is being seen in another office  Appt Canceled

## 2024-07-22 ENCOUNTER — Other Ambulatory Visit: Payer: Self-pay

## 2024-07-22 MED FILL — Dapagliflozin Propanediol Tab 10 MG (Base Equivalent): ORAL | 90 days supply | Qty: 90 | Fill #0 | Status: AC

## 2024-07-25 ENCOUNTER — Other Ambulatory Visit
Admission: RE | Admit: 2024-07-25 | Discharge: 2024-07-25 | Disposition: A | Discharge status: Home / Self Care | Source: Ambulatory Visit | Attending: Family | Admitting: Family

## 2024-07-25 ENCOUNTER — Ambulatory Visit: Payer: Self-pay | Admitting: Family

## 2024-07-25 ENCOUNTER — Encounter: Payer: Self-pay | Admitting: Family Medicine

## 2024-07-25 ENCOUNTER — Telehealth: Admitting: Family Medicine

## 2024-07-25 DIAGNOSIS — M1711 Unilateral primary osteoarthritis, right knee: Secondary | ICD-10-CM

## 2024-07-25 DIAGNOSIS — I5020 Unspecified systolic (congestive) heart failure: Secondary | ICD-10-CM | POA: Insufficient documentation

## 2024-07-25 DIAGNOSIS — Z1211 Encounter for screening for malignant neoplasm of colon: Secondary | ICD-10-CM

## 2024-07-25 DIAGNOSIS — G4709 Other insomnia: Secondary | ICD-10-CM

## 2024-07-25 DIAGNOSIS — G8929 Other chronic pain: Secondary | ICD-10-CM | POA: Diagnosis not present

## 2024-07-25 DIAGNOSIS — M7918 Myalgia, other site: Secondary | ICD-10-CM

## 2024-07-25 LAB — BASIC METABOLIC PANEL WITH GFR
Anion gap: 8 (ref 5–15)
BUN: 11 mg/dL (ref 6–20)
CO2: 25 mmol/L (ref 22–32)
Calcium: 8.8 mg/dL — ABNORMAL LOW (ref 8.9–10.3)
Chloride: 105 mmol/L (ref 98–111)
Creatinine, Ser: 0.89 mg/dL (ref 0.61–1.24)
GFR, Estimated: >60 mL/min (ref >60)
Glucose, Bld: 95 mg/dL (ref 70–99)
Potassium: 3.9 mmol/L (ref 3.5–5.1)
Sodium: 138 mmol/L (ref 135–145)

## 2024-07-25 NOTE — Progress Notes (Signed)
 Primary Care / Sports Medicine Virtual Visit  Patient Information:  Patient ID: Ricardo Yang, male DOB: Dec 17, 1975 Age: 48 y.o. MRN: [id number]   Ricardo L Pryor is a pleasant 48 y.o. male presenting with the following:  No chief complaint on file.   Review of Systems: No fevers, chills, night sweats, weight loss, chest pain, or shortness of breath.   Patient Active Problem List   Diagnosis Date Noted   Chronic musculoskeletal pain 07/25/2024   Depression 05/25/2024   Biceps tendinitis, right 11/03/2023   Healthcare maintenance 10/07/2023   Sleep apnea 01/01/2023   Dysfunction of right rotator cuff 01/01/2023   History of CVA with residual deficit 12/04/2022   Mixed hyperlipidemia 12/04/2022   History of MI (myocardial infarction) 12/04/2022   Insomnia 10/17/2022   Slow transit constipation 10/17/2022   Osteoarthritis of right knee 10/17/2022   History of substance abuse (HCC) 10/15/2022   HFrEF (heart failure with reduced ejection fraction) (HCC) 10/07/2022   Expressive aphasia 10/05/2022   Right hemiplegia (HCC) 10/05/2022   CAD S/P percutaneous coronary angioplasty 10/05/2022   Polysubstance abuse (HCC) 10/04/2022   Essential hypertension 04/22/2021   Ischemic chest pain    Past Medical History:  Diagnosis Date   Acute CVA (cerebrovascular accident) (HCC) 10/04/2022   Acute ST elevation myocardial infarction (STEMI) involving left anterior descending (LAD) coronary artery (HCC) 08/06/2019   Anxiety 09/2022   After stroke   CAD (coronary artery disease) 10/04/2022   Cerebrovascular accident (CVA) (HCC) 10/07/2022   Chest pain    CHF (congestive heart failure) (HCC)    Cocaine abuse (HCC)    Cocaine use 10/06/2022   Depression December 2023   When stroke happened   Elevated troponin    Encounter for imaging to screen for metal prior to MRI 10/07/2022   Healthcare maintenance 10/07/2023   Heavy smoker    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 question possible MS   NSTEMI (non-ST elevated myocardial infarction) (HCC) 05/25/2015   Obesity    Polysubstance abuse (HCC)    a. tobacco, cocaine, crack, ecstasy, pills, anything but injections   Primary hypertension 10/05/2022   Sleep apnea    Always   STEMI (ST elevation myocardial infarction) (HCC) 08/06/2019   Outpatient Encounter Medications as of 07/25/2024  Medication Sig   aspirin  81 MG chewable tablet Chew 1 tablet (81 mg total) by mouth daily.   atorvastatin  (LIPITOR ) 80 MG tablet Take 1 tablet (80 mg total) by mouth daily at 6 PM.   clopidogrel  (PLAVIX ) 75 MG tablet Take 1 tablet (75 mg total) by mouth daily with breakfast.   dapagliflozin  propanediol (FARXIGA ) 10 MG TABS tablet Take 1 tablet (10 mg total) by mouth daily. NEEDS FOLLOW UP APPOINTMENT FOR MORE REFILLS   DULoxetine  (CYMBALTA ) 60 MG capsule Take 1 capsule (60 mg total) by mouth daily. (Patient taking differently: Take 60 mg by mouth daily. Every other day)   metoprolol  succinate (TOPROL -XL) 25 MG 24 hr tablet Take 1 tablet (25 mg total) by mouth daily. Dose change   spironolactone  (ALDACTONE ) 25 MG tablet Take 1 tablet (25 mg total) by mouth daily.   traZODone  (DESYREL ) 50 MG tablet Take 0.5-2 tablets (25-100 mg total) by mouth at bedtime as needed.   valsartan  (DIOVAN ) 40 MG tablet Take 1 tablet (40 mg total) by mouth daily.   No facility-administered encounter medications on file as of 07/25/2024.   Past Surgical History:  Procedure Laterality Date   CARDIAC CATHETERIZATION N/A 05/28/2015   Procedure: Left Heart Cath and Coronary Angiography;  Surgeon: Evalene JINNY Lunger, MD;  Location: [facility] INVASIVE CV LAB;  Service: Cardiovascular;  Laterality: N/A;   CORONARY ANGIOGRAPHY N/A 04/22/2021   Procedure: CORONARY ANGIOGRAPHY;  Surgeon: Ammon Blunt, MD;  Location: [facility] INVASIVE CV LAB;  Service: Cardiovascular;  Laterality: N/A;   CORONARY/GRAFT ACUTE MI REVASCULARIZATION N/A  08/06/2019   Procedure: Coronary/Graft Acute MI Revascularization;  Surgeon: Ammon Blunt, MD;  Location: [facility] INVASIVE CV LAB;  Service: Cardiovascular;  Laterality: N/A;   LEFT HEART CATH N/A 04/22/2021   Procedure: Left Heart Cath;  Surgeon: Ammon Blunt, MD;  Location: [facility] INVASIVE CV LAB;  Service: Cardiovascular;  Laterality: N/A;   LEFT HEART CATH AND CORONARY ANGIOGRAPHY N/A 08/06/2019   Procedure: LEFT HEART CATH AND CORONARY ANGIOGRAPHY;  Surgeon: Ammon Blunt, MD;  Location: [facility] INVASIVE CV LAB;  Service: Cardiovascular;  Laterality: N/A;   TEE WITHOUT CARDIOVERSION N/A 10/08/2022   Procedure: TRANSESOPHAGEAL ECHOCARDIOGRAM (TEE);  Surgeon: Darliss Rogue, MD;  Location: [facility] ORS;  Service: Cardiovascular;  Laterality: N/A;  11:30am    Discussed the use of AI scribe software for clinical note transcription with the patient, who gave verbal consent to proceed.   Virtual Visit via MyChart Video:   I connected with Ricardo L Glasner on 07/25/24 via MyChart Video and verified that I am speaking with the correct person using appropriate identifiers.   The limitations, risks, security and privacy concerns of performing an evaluation and management service by MyChart Video, including the higher likelihood of inaccurate diagnoses and treatments, and the availability of in person appointments were reviewed. The possible need of an additional face-to-face encounter for complete and high quality delivery of care was discussed. The patient was also made aware that there may be a patient responsible charge related to this service. The patient expressed understanding and wishes to proceed.  Provider location is in medical facility. Patient location is at their home, different from provider location. People involved in care of the patient during this telehealth encounter were myself, my nurse/medical assistant, and my front office/scheduling team member.  Objective  findings:   General: Speaking full sentences, no audible heavy breathing. Sounds alert and appropriately interactive. Well-appearing. Face symmetric. Extraocular movements intact. Pupils equal and round. No nasal flaring or accessory muscle use visualized.  Independent interpretation of notes and tests performed by another provider:   None  Pertinent History, Exam, Impression, and Recommendations:   Problem List Items Addressed This Visit     Chronic musculoskeletal pain   Chronic pain syndrome Chronic pain managed with duloxetine  complicated by insomnia. Trazodone  used for sleep but causes grogginess. Plan to adjust trazodone  timing and increase duloxetine  once sleep stabilizes. - Adjust trazodone  timing to improve sleep quality and reduce grogginess. - Increase duloxetine  to daily dosing once sleep is stabilized.      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 Current dosing causes morning grogginess. - Adjust trazodone  timing to earlier in the evening to reduce morning grogginess.      Osteoarthritis of right knee - Primary   History of Present Illness Ricardo L Clapp is a 48 year old male with knee arthritis and meniscus tear who presents with knee pain and limited mobility. He is accompanied by his wife, Barnie.  Knee pain and limited mobility - Knee pain associated with underlying arthritis and meniscus tear - Pain is most severe during sit-to-stand,  squatting, kneeling, and stair use - Pain localized behind the patella, particularly on the lateral aspect - Pain exacerbated by knee flexion and extension - Limited mobility due to pain  Imaging and prior interventions - MRI performed on July 14, 2024 - Received a cortisone injection on May 30, 2024 - Cortisone injection provided 30-35% pain relief for approximately one week - Pain recurred, especially in the posterior knee after initial improvement  Medication effects and side  effects - Currently taking Cymbalta  (duloxetine ) 60 mg every other day due to sleep disturbances when taken daily - Takes trazodone  at night to aid sleep - Morning grogginess, particularly when physical or occupational therapy sessions are scheduled early  Results LABS PSA: Normal (09/2023)  RADIOLOGY Knee MRI: Moderate osteoarthritis, full-thickness cartilage loss in the lateral patellofemoral compartment, lateral meniscus tear (07/14/2024)  Assessment and Plan Right knee osteoarthritis with lateral patellofemoral cartilage loss and lateral meniscus tear Moderate osteoarthritis with full thickness cartilage loss at patellofemoral articulation and lateral meniscus complex tear. Subsequent cortisone injections provided diminishing relief. Discussed gel injections, nerve block, and LDRT as alternatives. Will also try to titrate up Cymbalta  to 60 mg. - Refer to physical therapy for kneecap tracking improvement. - Attempt to obtain coverage for visco supplementation. - Coordinate with pharmacy for gel injection coverage assistance. - Refer to radiation oncologist for LDRT consultation.      Relevant Orders   Ambulatory referral to Radiation Oncology   pharmacy consult   Ambulatory referral to Physical Therapy   Other Visit Diagnoses       Screen for colon cancer       Relevant Orders   Cologuard        Orders & Medications Medications: No orders of the defined types were placed in this encounter.  Orders Placed This Encounter  Procedures   Cologuard   Ambulatory referral to Radiation Oncology   Ambulatory referral to Physical Therapy   pharmacy consult     I discussed the above assessment and treatment plan with the patient. The patient was provided an opportunity to ask questions and all were answered. The patient agreed with the plan and demonstrated an understanding of the instructions.   The patient was advised to call back or seek an in-person evaluation if the  symptoms worsen or if the condition fails to improve as anticipated.   I provided a total time of 45 minutes including both face-to-face and non-face-to-face time on 07/25/2024 inclusive of time utilized for medical chart review, information gathering, care coordination with staff, and documentation completion.    Selinda JINNY Ku, MD, [facility]   Primary Care Sports Medicine Primary Care and Sports Medicine at [facility]

## 2024-07-25 NOTE — Assessment & Plan Note (Signed)
 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 Current dosing causes morning grogginess. - Adjust trazodone  timing to earlier in the evening to reduce morning grogginess.

## 2024-07-25 NOTE — Assessment & Plan Note (Signed)
 History of Present Illness Ricardo Yang is a 48 year old male with knee arthritis and meniscus tear who presents with knee pain and limited mobility. He is accompanied by his wife, Barnie.  Knee pain and limited mobility - Knee pain associated with underlying arthritis and meniscus tear - Pain is most severe during sit-to-stand, squatting, kneeling, and stair use - Pain localized behind the patella, particularly on the lateral aspect - Pain exacerbated by knee flexion and extension - Limited mobility due to pain  Imaging and prior interventions - MRI performed on July 14, 2024 - Received a cortisone injection on May 30, 2024 - Cortisone injection provided 30-35% pain relief for approximately one week - Pain recurred, especially in the posterior knee after initial improvement  Medication effects and side effects - Currently taking Cymbalta  (duloxetine ) 60 mg every other day due to sleep disturbances when taken daily - Takes trazodone  at night to aid sleep - Morning grogginess, particularly when physical or occupational therapy sessions are scheduled early  Results LABS PSA: Normal (09/2023)  RADIOLOGY Knee MRI: Moderate osteoarthritis, full-thickness cartilage loss in the lateral patellofemoral compartment, lateral meniscus tear (07/14/2024)  Assessment and Plan Right knee osteoarthritis with lateral patellofemoral cartilage loss and lateral meniscus tear Moderate osteoarthritis with full thickness cartilage loss at patellofemoral articulation and lateral meniscus complex tear. Subsequent cortisone injections provided diminishing relief. Discussed gel injections, nerve block, and LDRT as alternatives. Will also try to titrate up Cymbalta  to 60 mg. - Refer to physical therapy for kneecap tracking improvement. - Attempt to obtain coverage for visco supplementation. - Coordinate with pharmacy for gel injection coverage assistance. - Refer to radiation oncologist for LDRT  consultation.

## 2024-07-25 NOTE — Assessment & Plan Note (Signed)
 Chronic pain syndrome Chronic pain managed with duloxetine  complicated by insomnia. Trazodone  used for sleep but causes grogginess. Plan to adjust trazodone  timing and increase duloxetine  once sleep stabilizes. - Adjust trazodone  timing to improve sleep quality and reduce grogginess. - Increase duloxetine  to daily dosing once sleep is stabilized.

## 2024-07-25 NOTE — Patient Instructions (Addendum)
 VISIT SUMMARY:  You visited us  today due to knee pain and limited mobility caused by arthritis and a meniscus tear. We discussed various treatment options and made plans to manage your chronic pain and insomnia. Additionally, we addressed your need for colorectal screening.  YOUR PLAN:  RIGHT KNEE OSTEOARTHRITIS WITH LATERAL PATELLOFEMORAL CARTILAGE LOSS AND LATERAL MENISCUS TEAR: You have moderate osteoarthritis with full thickness cartilage loss and a lateral meniscus tear in your right knee. Previous cortisone injection provided limited relief. -We will refer you to physical therapy to improve kneecap tracking. -We will attempt to obtain coverage for gel injections. -We will coordinate with the pharmacy for gel injection coverage assistance. -We will refer you to a radiation oncologist for an LDRT consultation.  CHRONIC PAIN SYNDROME: Your chronic pain is managed with duloxetine , but it is complicated by insomnia. Trazodone  is used for sleep but causes grogginess. -We will adjust the timing of trazodone  to improve sleep quality and reduce grogginess. -Once your sleep stabilizes, we will increase duloxetine  to daily dosing.  INSOMNIA: Your insomnia is related to duloxetine  and is managed with trazodone , which currently causes morning grogginess. -We will adjust trazodone  timing to earlier in the evening to reduce morning grogginess.  COLORECTAL CANCER SCREENING: You have a family history of colon cancer, making colorectal cancer screening important. -We will order a Cologuard test for colorectal cancer screening.

## 2024-08-01 ENCOUNTER — Other Ambulatory Visit: Payer: Self-pay | Admitting: Family Medicine

## 2024-08-01 ENCOUNTER — Other Ambulatory Visit: Payer: Self-pay

## 2024-08-01 DIAGNOSIS — I6930 Unspecified sequelae of cerebral infarction: Secondary | ICD-10-CM

## 2024-08-01 DIAGNOSIS — I10 Essential (primary) hypertension: Secondary | ICD-10-CM

## 2024-08-01 MED FILL — Clopidogrel Bisulfate Tab 75 MG (Base Equiv): ORAL | 30 days supply | Qty: 30 | Fill #2 | Status: AC

## 2024-08-01 MED FILL — Valsartan Tab 40 MG: ORAL | 30 days supply | Qty: 30 | Fill #2 | Status: AC

## 2024-08-02 ENCOUNTER — Other Ambulatory Visit: Payer: Self-pay

## 2024-08-03 ENCOUNTER — Other Ambulatory Visit: Payer: Self-pay

## 2024-08-03 ENCOUNTER — Encounter: Payer: Self-pay | Admitting: Radiation Oncology

## 2024-08-03 ENCOUNTER — Ambulatory Visit
Admission: RE | Admit: 2024-08-03 | Discharge: 2024-08-03 | Disposition: A | Discharge status: Home / Self Care | Source: Ambulatory Visit | Attending: Radiation Oncology | Admitting: Radiation Oncology

## 2024-08-03 VITALS — Resp 16 | Ht 76.0 in | Wt 287.9 lb

## 2024-08-03 DIAGNOSIS — M1612 Unilateral primary osteoarthritis, left hip: Secondary | ICD-10-CM | POA: Insufficient documentation

## 2024-08-03 DIAGNOSIS — Z510 Encounter for antineoplastic radiation therapy: Secondary | ICD-10-CM | POA: Insufficient documentation

## 2024-08-03 MED FILL — Atorvastatin Calcium Tab 80 MG (Base Equivalent): ORAL | 90 days supply | Qty: 90 | Fill #0 | Status: AC

## 2024-08-03 MED FILL — Metoprolol Succinate Tab ER 24HR 25 MG (Tartrate Equiv): ORAL | 90 days supply | Qty: 90 | Fill #0 | Status: AC

## 2024-08-03 NOTE — Telephone Encounter (Signed)
 Requested Prescriptions  Pending Prescriptions Disp Refills   atorvastatin  (LIPITOR ) 80 MG tablet 90 tablet 0    Sig: Take 1 tablet (80 mg total) by mouth daily at 6 PM.     Cardiovascular:  Antilipid - Statins Failed - 08/03/2024  9:20 AM      Failed - Lipid Panel in normal range within the last 12 months    Cholesterol, Total  Date Value Ref Range Status  10/07/2023 179 100 - 199 mg/dL Final   Cholesterol  Date Value Ref Range Status  07/12/2024 104 0 - 200 mg/dL Final   LDL Chol Calc (NIH)  Date Value Ref Range Status  10/07/2023 114 (H) 0 - 99 mg/dL Final   LDL Cholesterol  Date Value Ref Range Status  07/12/2024 47 0 - 99 mg/dL Final    Comment:           Total Cholesterol/HDL:CHD Risk Coronary Heart Disease Risk Table                     Men   Women  1/2 Average Risk   3.4   3.3  Average Risk       5.0   4.4  2 X Average Risk   9.6   7.1  3 X Average Risk  23.4   11.0        Use the calculated Patient Ratio above and the CHD Risk Table to determine the patient's CHD Risk.        ATP III CLASSIFICATION (LDL):  <100     mg/dL   Optimal  899-870  mg/dL   Near or Above                    Optimal  130-159  mg/dL   Borderline  839-810  mg/dL   High  >809     mg/dL   Very High Performed at [facility], [address] [place], [place] [address]    HDL  Date Value Ref Range Status  07/12/2024 40 (L) >40 mg/dL Final  87/88/7975 47 >60 mg/dL Final   Triglycerides  Date Value Ref Range Status  07/12/2024 86 <150 mg/dL Final         Passed - Patient is not pregnant      Passed - Valid encounter within last 12 months    Recent Outpatient Visits           1 week ago Primary osteoarthritis of right knee   [facility], Selinda PARAS, MD   3 weeks ago Primary osteoarthritis of right knee   [facility], Selinda PARAS, MD   2 months ago Primary  osteoarthritis of right knee   [facility], Selinda PARAS, MD   2 months ago History of CVA with residual deficit   [facility], Selinda PARAS, MD       Future Appointments             In 6 days Argentina Clap, MD [facility]             metoprolol  succinate (TOPROL -XL) 25 MG 24 hr tablet 90 tablet 0    Sig: Take 1 tablet (25 mg total) by mouth daily. Dose change     Cardiovascular:  Beta Blockers Passed - 08/03/2024  9:20 AM      Passed - Last BP in normal range    BP Readings from Last 1 Encounters:  07/19/24 121/89         Passed - Last Heart Rate in normal range    Pulse Readings from Last 1 Encounters:  07/19/24 67         Passed - Valid encounter within last 6 months    Recent Outpatient Visits           1 week ago Primary osteoarthritis of right knee   [place] [facility], Selinda PARAS, MD   3 weeks ago Primary osteoarthritis of right knee   [facility], Selinda PARAS, MD   2 months ago Primary osteoarthritis of right knee   [facility], Selinda PARAS, MD   2 months ago History of CVA with residual deficit   [facility] at [facility], Selinda PARAS, MD       Future Appointments             In 6 days Argentina Clap, MD [facility]

## 2024-08-03 NOTE — Consult Note (Signed)
 NEW PATIENT EVALUATION  Name: Ricardo Yang  MRN: [id number]  Date:   08/03/2024     DOB: October 06, 1976   This 48 y.o. male patient presents to the clinic for initial evaluation of osteoarthritis of the right knee.  REFERRING PHYSICIAN: Alvia Selinda PARAS, MD  CHIEF COMPLAINT:  Chief Complaint  Patient presents with   Consult    DIAGNOSIS: There were no encounter diagnoses.   PREVIOUS INVESTIGATIONS:  MRI of right knee reviewed Clinical notes reviewed  HPI: Patient is a 48 year old male whose had problems with his right knee since high school.  Pain has intensified recently up to 7 out of 10 on a pain scale.  It eases off somewhat when he is sitting.  He had a recent MRI scan of his right knee showing tricompartmental osteoarthritis most notable of the lateral compartment and lateral patellofemoral compartment.  There is significant chondromalacia with areas of full-thickness cartilage loss.  There is also moderate suprapatellar joint effusion.  There is also a complex tear of the posterior horn and body of the lateral meniscus.  He is wearing a soft brace.  PLANNED TREATMENT REGIMEN: Low-dose radiation therapy  PAST MEDICAL HISTORY:  has a past medical history of Acute CVA (cerebrovascular accident) (HCC) (10/04/2022), Acute ST elevation myocardial infarction (STEMI) involving left anterior descending (LAD) coronary artery (HCC) (08/06/2019), Anxiety (09/2022), CAD (coronary artery disease) (10/04/2022), Cerebrovascular accident (CVA) (HCC) (10/07/2022), Chest pain, CHF (congestive heart failure) (HCC), Cocaine abuse (HCC), Cocaine use (10/06/2022), Depression (December 2023), Elevated troponin, Encounter for imaging to screen for metal prior to MRI (10/07/2022), Healthcare maintenance (10/07/2023), Heavy smoker, 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    PAST SURGICAL HISTORY:  Past Surgical History:  Procedure Laterality Date   CARDIAC CATHETERIZATION N/A 05/28/2015   Procedure: Left Heart Cath and Coronary Angiography;  Surgeon: Evalene PARAS Lunger, MD;  Location: [facility] INVASIVE CV LAB;  Service: Cardiovascular;  Laterality: N/A;   CORONARY ANGIOGRAPHY N/A 04/22/2021   Procedure: CORONARY ANGIOGRAPHY;  Surgeon: Ammon Blunt, MD;  Location: [facility] INVASIVE CV LAB;  Service: Cardiovascular;  Laterality: N/A;   CORONARY/GRAFT ACUTE MI REVASCULARIZATION N/A 08/06/2019   Procedure: Coronary/Graft Acute MI Revascularization;  Surgeon: Ammon Blunt, MD;  Location: [facility] INVASIVE CV LAB;  Service: Cardiovascular;  Laterality: N/A;   LEFT HEART CATH N/A 04/22/2021   Procedure: Left Heart Cath;  Surgeon: Ammon Blunt, MD;  Location: [facility] INVASIVE CV LAB;  Service: Cardiovascular;  Laterality: N/A;   LEFT HEART CATH AND CORONARY ANGIOGRAPHY N/A 08/06/2019   Procedure: LEFT HEART CATH AND CORONARY ANGIOGRAPHY;  Surgeon: Ammon Blunt, MD;  Location: [facility] INVASIVE CV LAB;  Service: Cardiovascular;  Laterality: N/A;   TEE WITHOUT CARDIOVERSION N/A 10/08/2022   Procedure: TRANSESOPHAGEAL ECHOCARDIOGRAM (TEE);  Surgeon: Darliss Rogue, MD;  Location: [facility] ORS;  Service: Cardiovascular;  Laterality: N/A;  11:30am    FAMILY HISTORY: family history includes Alcohol abuse in his maternal grandfather; Cancer in his maternal grandfather and maternal grandmother; Depression in his daughter and maternal grandfather; Heart disease in his father; Hypertension in his father, maternal grandfather, and maternal grandmother; Miscarriages / [place] in his mother; Stroke in his maternal grandfather.  SOCIAL HISTORY:  reports that he has been smoking cigarettes. He has a 3.8 pack-year smoking history. He has never used smokeless  tobacco. He reports that he does not currently  use alcohol after a past usage of about 1.0 standard drink of alcohol per week. He reports current drug use. Frequency: 1.00 time per week. Drugs: Cocaine and Marijuana.  ALLERGIES: Patient has no known allergies.  MEDICATIONS:  Current Outpatient Medications  Medication Sig Dispense Refill   aspirin  81 MG chewable tablet Chew 1 tablet (81 mg total) by mouth daily. 30 tablet 5   atorvastatin  (LIPITOR ) 80 MG tablet Take 1 tablet (80 mg total) by mouth daily at 6 PM. 90 tablet 0   clopidogrel  (PLAVIX ) 75 MG tablet Take 1 tablet (75 mg total) by mouth daily with breakfast. 30 tablet 5   dapagliflozin  propanediol (FARXIGA ) 10 MG TABS tablet Take 1 tablet (10 mg total) by mouth daily. NEEDS FOLLOW UP APPOINTMENT FOR MORE REFILLS 90 tablet 0   DULoxetine  (CYMBALTA ) 60 MG capsule Take 1 capsule (60 mg total) by mouth daily. (Patient taking differently: Take 60 mg by mouth daily. Every other day) 90 capsule 0   metoprolol  succinate (TOPROL -XL) 25 MG 24 hr tablet Take 1 tablet (25 mg total) by mouth daily. Dose change 90 tablet 0   spironolactone  (ALDACTONE ) 25 MG tablet Take 1 tablet (25 mg total) by mouth daily. 90 tablet 3   traZODone  (DESYREL ) 50 MG tablet Take 0.5-2 tablets (25-100 mg total) by mouth at bedtime as needed. 60 tablet 0   valsartan  (DIOVAN ) 40 MG tablet Take 1 tablet (40 mg total) by mouth daily. 30 tablet 5   No current facility-administered medications for this encounter.    ECOG PERFORMANCE STATUS:  1 - Symptomatic but completely ambulatory  REVIEW OF SYSTEMS: Patient denies any weight loss, fatigue, weakness, fever, chills or night sweats. Patient denies any loss of vision, blurred vision. Patient denies any ringing  of the ears or hearing loss. No irregular heartbeat. Patient denies heart murmur or history of fainting. Patient denies any chest pain or pain radiating to her upper extremities. Patient denies any shortness of breath,  difficulty breathing at night, cough or hemoptysis. Patient denies any swelling in the lower legs. Patient denies any nausea vomiting, vomiting of blood, or coffee ground material in the vomitus. Patient denies any stomach pain. Patient states has had normal bowel movements no significant constipation or diarrhea. Patient denies any dysuria, hematuria or significant nocturia. Patient denies any problems walking, swelling in the joints or loss of balance. Patient denies any skin changes, loss of hair or loss of weight. Patient denies any excessive worrying or anxiety or significant depression. Patient denies any problems with insomnia. Patient denies excessive thirst, polyuria, polydipsia. Patient denies any swollen glands, patient denies easy bruising or easy bleeding. Patient denies any recent infections, allergies or URI. Patient s visual fields have not changed significantly in recent time.   PHYSICAL EXAM: Resp 16   Ht 6' 4 (1.93 m)   Wt 287 lb 14.4 oz (130.6 kg)   BMI 35.04 kg/m  Range of motion of his knee does not elicit pain.  Motor and sensory levels are equal and symmetric in the lower extremities.  Range of motion of his hips bilaterally does not elicit pain.  Proprioception is intact.  Well-developed well-nourished patient in NAD. HEENT reveals PERLA, EOMI, discs not visualized.  Oral cavity is clear. No oral mucosal lesions are identified. Neck is clear without evidence of cervical or supraclavicular adenopathy. Lungs are clear to A&P. Cardiac examination is essentially unremarkable with regular rate and rhythm without murmur rub or thrill. Abdomen is benign with no organomegaly or  masses noted. Motor sensory and DTR levels are equal and symmetric in the upper and lower extremities. Cranial nerves II through XII are grossly intact. Proprioception is intact. No peripheral adenopathy or edema is identified. No motor or sensory levels are noted. Crude visual fields are within normal  range.  LABORATORY DATA: Labs reviewed    RADIOLOGY RESULTS: MRI scan reviewed compatible with above-stated findings   IMPRESSION: Osteoarthritis of the right knee in 48 year old male  PLAN: At this time would like to go ahead with low-dose radiation therapy to his right knee.  Would plan on delivering 3 Gray in 6 fractions.  Risks and benefits of treatment including extremely low side effect profile were discussed with the patient.  I have personally set up and ordered CT simulation for early next week.  Patient has consented to treatment and comprehends my recommendations well.  I would like to take this opportunity to thank you for allowing me to participate in the care of your patient.SABRA Marcey Penton, MD

## 2024-08-04 ENCOUNTER — Other Ambulatory Visit: Payer: Self-pay

## 2024-08-07 ENCOUNTER — Other Ambulatory Visit: Payer: Self-pay | Admitting: Medical Genetics

## 2024-08-08 ENCOUNTER — Ambulatory Visit
Admission: RE | Admit: 2024-08-08 | Discharge: 2024-08-08 | Disposition: A | Source: Ambulatory Visit | Attending: Radiation Oncology | Admitting: Radiation Oncology

## 2024-08-08 DIAGNOSIS — Z510 Encounter for antineoplastic radiation therapy: Secondary | ICD-10-CM | POA: Diagnosis present

## 2024-08-08 DIAGNOSIS — M1612 Unilateral primary osteoarthritis, left hip: Secondary | ICD-10-CM | POA: Diagnosis present

## 2024-08-08 NOTE — Progress Notes (Unsigned)
  Cardiology Office Note   Date:  08/08/2024  ID:  Ricardo Yang, DOB 12/29/1975, MRN [id number] PCP: Alvia Selinda PARAS, MD  [facility] Providers Cardiologist:  Redell Cave, MD { Click to update primary MD,subspecialty MD or APP then REFRESH:1}    History of Present Illness Ricardo L Shawler is a 48 y.o. male PMH CAD status post PCI LAD (2020 in setting of STEMI) complicated by ICM, HTN, CVA 2023, prior polysubstance abuse who presents to establish care.  ***  Relevant CVD History -TEE 09/2022 LVEF 20 to 25%, no LAA thrombus, mild MR, negative bubble study (post-stroke) -TTE 09/2022 LVEF less than 20%, mildly reduced RV function, mild MR - Coronary angiogram 03/2021 patent LAD stent, 50% mid RCA, occluded OM2, 20% ostial LAD - TTE 03/2021 LVEF 25 to 30% - TTE 07/2019 LVEF 45 to 50% - Coronary angiogram 07/2019 occluded proximal LAD treated with PCI  ROS: Pt denies any chest discomfort, jaw pain, arm pain, palpitations, syncope, presyncope, orthopnea, PND, or LE edema.  Studies Reviewed I have independently reviewed the patient's ECG, previous cardiac testing, recent medical records.  Physical Exam VS:  There were no vitals taken for this visit.       Wt Readings from Last 3 Encounters:  08/03/24 287 lb 14.4 oz (130.6 kg)  07/19/24 288 lb (130.6 kg)  07/12/24 284 lb (128.8 kg)    GEN: No acute distress. NECK: No JVD; No carotid bruits. CARDIAC: ***RRR, no murmurs, rubs, gallops. RESPIRATORY:  Clear to auscultation. EXTREMITIES:  Warm and well-perfused. No edema.  ASSESSMENT AND PLAN CAD status post PCI LAD 2020 Ischemic cardiomyopathy History of stroke        {Are you ordering a CV Procedure (e.g. stress test, cath, DCCV, TEE, etc)?   Press F2        :[id number]}  Dispo: ***  Signed, Caron Poser, MD

## 2024-08-09 ENCOUNTER — Ambulatory Visit

## 2024-08-11 DIAGNOSIS — Z510 Encounter for antineoplastic radiation therapy: Secondary | ICD-10-CM | POA: Diagnosis not present

## 2024-08-16 ENCOUNTER — Ambulatory Visit
Admission: RE | Admit: 2024-08-16 | Discharge: 2024-08-16 | Disposition: A | Source: Ambulatory Visit | Attending: Radiation Oncology | Admitting: Radiation Oncology

## 2024-08-16 ENCOUNTER — Other Ambulatory Visit: Payer: Self-pay

## 2024-08-16 DIAGNOSIS — Z510 Encounter for antineoplastic radiation therapy: Secondary | ICD-10-CM | POA: Diagnosis not present

## 2024-08-16 LAB — RAD ONC ARIA SESSION SUMMARY
Course Elapsed Days: 0
Plan Fractions Treated to Date: 1
Plan Prescribed Dose Per Fraction: 0.5 Gy
Plan Total Fractions Prescribed: 6
Plan Total Prescribed Dose: 3 Gy
Reference Point Dosage Given to Date: 0.5 Gy
Reference Point Session Dosage Given: 0.5 Gy
Session Number: 1

## 2024-08-18 ENCOUNTER — Other Ambulatory Visit: Payer: Self-pay

## 2024-08-18 ENCOUNTER — Ambulatory Visit
Admission: RE | Admit: 2024-08-18 | Discharge: 2024-08-18 | Disposition: A | Discharge status: Home / Self Care | Source: Ambulatory Visit | Attending: Radiation Oncology | Admitting: Radiation Oncology

## 2024-08-18 DIAGNOSIS — Z510 Encounter for antineoplastic radiation therapy: Secondary | ICD-10-CM | POA: Diagnosis not present

## 2024-08-18 LAB — RAD ONC ARIA SESSION SUMMARY
Course Elapsed Days: 2
Plan Fractions Treated to Date: 2
Plan Prescribed Dose Per Fraction: 0.5 Gy
Plan Total Fractions Prescribed: 6
Plan Total Prescribed Dose: 3 Gy
Reference Point Dosage Given to Date: 1 Gy
Reference Point Session Dosage Given: 0.5 Gy
Session Number: 2

## 2024-08-19 NOTE — Progress Notes (Signed)
 Cardiology Office Note   Date:  08/29/2024  ID:  Ricardo Yang, DOB [birth year], MRN [id number] PCP: Alvia Selinda PARAS, MD  [facility] Providers Cardiologist:  Caron Poser, MD     History of Present Illness Ricardo Yang is a 48 y.o. male PMH CAD status post PCI LAD (2020 in setting of STEMI) complicated by ICM, HTN, CVA 2023, prior polysubstance abuse who presents to establish care.  Patient reports he is overall doing well.  He has been following with advanced heart failure for quite some time.  He has an upcoming dental extraction where they are inquiring about medication adjustments.  He denies any significant orthopnea or DOE.  He is quite active and is not limited currently.  Last LDL 47 06/2024.  Relevant CVD History -TEE 09/2022 LVEF 20 to 25%, no LAA thrombus, mild MR, negative bubble study (post-stroke) -TTE 09/2022 LVEF less than 20%, mildly reduced RV function, mild MR - Coronary angiogram 03/2021 patent LAD stent, 50% mid RCA, occluded OM2, 20% ostial LAD - TTE 03/2021 LVEF 25 to 30% - TTE 07/2019 LVEF 45 to 50% - Coronary angiogram 07/2019 occluded proximal LAD treated with PCI  ROS: Pt denies any chest discomfort, jaw pain, arm pain, palpitations, syncope, presyncope, orthopnea, PND, or LE edema.  Studies Reviewed I have independently reviewed the patient's ECG, previous cardiac testing, recent medical records.  Physical Exam VS:  BP 122/70 (BP Location: Right Arm, Patient Position: Sitting)   Ht 6' 4 (1.93 m)   Wt 286 lb 6.4 oz (129.9 kg)   SpO2 99%   BMI 34.86 kg/m        Wt Readings from Last 3 Encounters:  08/29/24 286 lb 6.4 oz (129.9 kg)  08/23/24 285 lb 6.4 oz (129.5 kg)  08/03/24 287 lb 14.4 oz (130.6 kg)    GEN: No acute distress. NECK: No JVD; No carotid bruits. CARDIAC: RRR, no murmurs, rubs, gallops. RESPIRATORY:  Clear to auscultation. EXTREMITIES:  Warm and well-perfused. No edema.  ASSESSMENT AND PLAN CAD status post PCI LAD  2020 Ischemic cardiomyopathy Patient presents for routine follow-up of his chronic cardiac comorbidities.  He is overall doing very well from a heart failure standpoint, and is NYHA I-II at most.  He is very active and has not had any orthopnea or required any additional diuresis.  He appears euvolemic on exam.  He has an upcoming tooth extraction planned; we discussed holding Plavix  going forward so that he can have a tooth extraction done since he does not have any indication for DAPT.  He is otherwise on good medical therapy and feeling well.  Plan: - Continue ASA 81 mg daily - Stop Plavix  since he does not have a current indication for DAPT; he has an upcoming dental extraction planned and his PCI was in 2020 - Continue metoprolol  XL 25 mg daily, continue dapagliflozin  10 mg daily, continue valsartan  40 mg daily, continue spironolactone  25 mg daily - Will refer to EP for consideration of ICD implantation; he is a bit unsure about it at the moment and would like to discuss further with the EP team  History of stroke HLD Last LDL 47 06/2024.  As above, no indication for DAPT.  Plan: - Continue ASA 81 mg daily - Continue Lipitor  80 mg daily  Morbid obesity Complicating all aspects of care.  He has tried to lose weight with conservative measures without success.  He would be a good candidate for [facility] given his concurrent CAD/ICM.  Will refer to our pharmacy team for review of weight loss drug options.       Dispo: RTC 6 months or sooner as needed  Signed, Caron Poser, MD

## 2024-08-22 ENCOUNTER — Telehealth: Payer: Self-pay | Admitting: Family

## 2024-08-22 NOTE — Telephone Encounter (Signed)
 Called to confirm/remind patient of their appointment at the [facility] on 08/23/24.   Appointment:   [x] Confirmed  [] Left mess   [] No answer/No voice mail  [] VM Full/unable to leave message  [] Phone not in service  Patient reminded to bring all medications and/or complete list.  Confirmed patient has transportation. Gave directions, instructed to utilize valet parking.

## 2024-08-23 ENCOUNTER — Ambulatory Visit: Attending: Family | Admitting: Family

## 2024-08-23 ENCOUNTER — Other Ambulatory Visit: Payer: Self-pay

## 2024-08-23 ENCOUNTER — Ambulatory Visit
Admission: RE | Admit: 2024-08-23 | Discharge: 2024-08-23 | Disposition: A | Discharge status: Home / Self Care | Source: Ambulatory Visit | Attending: Radiation Oncology | Admitting: Radiation Oncology

## 2024-08-23 ENCOUNTER — Encounter: Payer: Self-pay | Admitting: Family

## 2024-08-23 VITALS — BP 123/83 | HR 80 | Ht 76.0 in | Wt 285.4 lb

## 2024-08-23 DIAGNOSIS — I2510 Atherosclerotic heart disease of native coronary artery without angina pectoris: Secondary | ICD-10-CM | POA: Insufficient documentation

## 2024-08-23 DIAGNOSIS — I110 Hypertensive heart disease with heart failure: Secondary | ICD-10-CM | POA: Insufficient documentation

## 2024-08-23 DIAGNOSIS — F17210 Nicotine dependence, cigarettes, uncomplicated: Secondary | ICD-10-CM | POA: Diagnosis not present

## 2024-08-23 DIAGNOSIS — Z7984 Long term (current) use of oral hypoglycemic drugs: Secondary | ICD-10-CM | POA: Diagnosis not present

## 2024-08-23 DIAGNOSIS — F1290 Cannabis use, unspecified, uncomplicated: Secondary | ICD-10-CM | POA: Insufficient documentation

## 2024-08-23 DIAGNOSIS — G4730 Sleep apnea, unspecified: Secondary | ICD-10-CM | POA: Insufficient documentation

## 2024-08-23 DIAGNOSIS — F32A Depression, unspecified: Secondary | ICD-10-CM | POA: Insufficient documentation

## 2024-08-23 DIAGNOSIS — E785 Hyperlipidemia, unspecified: Secondary | ICD-10-CM | POA: Insufficient documentation

## 2024-08-23 DIAGNOSIS — Z7982 Long term (current) use of aspirin: Secondary | ICD-10-CM | POA: Insufficient documentation

## 2024-08-23 DIAGNOSIS — I69351 Hemiplegia and hemiparesis following cerebral infarction affecting right dominant side: Secondary | ICD-10-CM | POA: Diagnosis not present

## 2024-08-23 DIAGNOSIS — Z79899 Other long term (current) drug therapy: Secondary | ICD-10-CM | POA: Diagnosis not present

## 2024-08-23 DIAGNOSIS — F419 Anxiety disorder, unspecified: Secondary | ICD-10-CM | POA: Diagnosis not present

## 2024-08-23 DIAGNOSIS — G35D Multiple sclerosis, unspecified: Secondary | ICD-10-CM | POA: Insufficient documentation

## 2024-08-23 DIAGNOSIS — F1090 Alcohol use, unspecified, uncomplicated: Secondary | ICD-10-CM | POA: Insufficient documentation

## 2024-08-23 DIAGNOSIS — I6930 Unspecified sequelae of cerebral infarction: Secondary | ICD-10-CM | POA: Diagnosis not present

## 2024-08-23 DIAGNOSIS — I5020 Unspecified systolic (congestive) heart failure: Secondary | ICD-10-CM

## 2024-08-23 DIAGNOSIS — I10 Essential (primary) hypertension: Secondary | ICD-10-CM | POA: Diagnosis not present

## 2024-08-23 DIAGNOSIS — F1910 Other psychoactive substance abuse, uncomplicated: Secondary | ICD-10-CM

## 2024-08-23 DIAGNOSIS — Z510 Encounter for antineoplastic radiation therapy: Secondary | ICD-10-CM | POA: Diagnosis not present

## 2024-08-23 DIAGNOSIS — Z9861 Coronary angioplasty status: Secondary | ICD-10-CM

## 2024-08-23 DIAGNOSIS — I5022 Chronic systolic (congestive) heart failure: Secondary | ICD-10-CM | POA: Diagnosis not present

## 2024-08-23 DIAGNOSIS — Z7902 Long term (current) use of antithrombotics/antiplatelets: Secondary | ICD-10-CM | POA: Insufficient documentation

## 2024-08-23 LAB — RAD ONC ARIA SESSION SUMMARY
Course Elapsed Days: 7
Plan Fractions Treated to Date: 3
Plan Prescribed Dose Per Fraction: 0.5 Gy
Plan Total Fractions Prescribed: 6
Plan Total Prescribed Dose: 3 Gy
Reference Point Dosage Given to Date: 1.5 Gy
Reference Point Session Dosage Given: 0.5 Gy
Session Number: 3

## 2024-08-23 NOTE — Patient Instructions (Addendum)
 Medication Changes:  No medication changes.   Lab Work:  Go downstairs to [facility] on [address] to have your blood work completed today.  We will only call you if the results are abnormal or if the provider would like to make medication changes.  No news is good news.   Follow-Up in: 4-6 months with Ricardo Class, FNP.   Thank you for choosing [facility].    At the [facility], you and your health needs are our priority. We have a designated team specialized in the treatment of Heart Failure. This Care Team includes your primary Heart Failure Specialized Cardiologist (physician), Advanced Practice Providers (APPs- Physician Assistants and Nurse Practitioners), and Pharmacist who all work together to provide you with the care you need, when you need it.   You may see any of the following providers on your designated Care Team at your next follow up:  Dr. Toribio Fuel Dr. Ezra Shuck Dr. Ria Commander Dr. Morene Brownie Ricardo Class, FNP Jaun Bash, RPH-CPP  Please be sure to bring in all your medications bottles to every appointment.   Need to Contact Us :  If you have any questions or concerns before your next appointment please send us  a message through [place] or call our office at [phone].    TO LEAVE A MESSAGE FOR THE NURSE SELECT OPTION 2, PLEASE LEAVE A MESSAGE INCLUDING: YOUR NAME DATE OF BIRTH CALL BACK NUMBER REASON FOR CALL**this is important as we prioritize the call backs  YOU WILL RECEIVE A CALL BACK THE SAME DAY AS LONG AS YOU CALL BEFORE 4:00 PM

## 2024-08-23 NOTE — Progress Notes (Signed)
 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   LHC 06/22: Mid RCA lesion is 50% stenosed. 2nd Mrg lesion is 100% stenosed. Ost LAD to Prox LAD lesion is 20% stenosed.  1.  Non-ST elevation myocardial infarction 2.  Patent stent proximal LAD 3.  Distal occlusion small to moderate caliber OM 2 4.  Mild to moderate reduced left ventricular function with apical dyskinesis  Admitted 10/04/22 due to  acute onset of aphasia and right-sided weakness with leftward gaze. MRI showed acute infarct in the left ACA, left MCA and likely of the right PCA territory with a large infarct involving the left postcentral gyrus. Transferred to rehab. TEE 10/08/22: EF 20-25%, no atrial thrombus, mild Ricardo. Echo 10/05/22: EF <20%, mild LAE/RAE and mild Ricardo  Was in the ED 11/27/22 due to right hand swelling. Ultrasound negative for DVT.   Was in the ER 05/19/24 following a mechanical fall. Unclear of what surrounded the fall. CT negative for acute stroke. Shoulder xray was negative.   Echo 07/12/24, awaiting release of findings  He presents today for a HF follow up visit with no acute concerns. Has started taking spirolactone daily with no associated complications. Had ow dose radiation today for arthritis, reports improved symptoms.  Previous fatigue has improved, he associates symptom with Cymbalta  use. Has recently been prescribed trazodone  that has helped with insomnia. Denies shortness of breath, chest pain, dizziness, palpitations, abdominal distention, swelling. No sleep distrubances, sleep well with 2 pillows, denies orthopnea and PND. Has chronic right arm/ hand pain and weakness since stroke  12/23. Continues to use tylenol  occasionally for pain relief.   Uses marijuana daily and uses cigarettes 1-2 every other day. Alcohol use 2-3 times a week, between 2-5 shots each time. Has decreased diet high is saturated fat (hot dogs and burgers), no pork , and NAS. Drinking 4-5 bottles/day of water, decreased V8 to 1-3 cups , and has decreased soda intake. Weighing daily has noted weight is stable.    ROS: All systems negative except as listed in HPI, PMH and Problem List.  SH:  Social History   Socioeconomic History   Marital status: Single    Spouse name: Not on file   Number of children: Not on file   Years of education: Not on file   Highest education level: Not on file  Occupational History   Not on file  Tobacco Use   Smoking status: Some Days    Current packs/day: 0.25    Average packs/day: 0.3 packs/day for 15.0 years (3.8 ttl pk-yrs)    Types: Cigarettes   Smokeless tobacco: Never   Tobacco comments:    Started as a teenager  Vaping Use   Vaping status: Never Used  Substance and Sexual Activity   Alcohol use: Not Currently    Alcohol/week: 1.0 standard drink of alcohol    Types: 1 Shots of liquor per week   Drug use: Yes    Frequency: 1.0 times per week    Types: Cocaine, Marijuana    Comment: 01/26/23 current marijuana no cocaine   Sexual activity: Not Currently    Partners: Female    Birth control/protection: Abstinence, Condom  Other Topics Concern  Not on file  Social History Narrative   Not on file   Social Drivers of Health   Financial Resource Strain: High Risk (06/02/2024)   Received from [facility]   Overall Financial Resource Strain (CARDIA)    Difficulty of Paying Living Expenses: Hard  Food Insecurity: Food Insecurity Present (06/02/2024)   Received from [facility]   Hunger Vital Sign    Within the past 12 months, you worried that your food would run out before you got the money to buy more.: Sometimes  true    Within the past 12 months, the food you bought just didn't last and you didn't have money to get more.: Sometimes true  Transportation Needs: No Transportation Needs (06/02/2024)   Received from [facility] - Transportation    In the past 12 months, has lack of transportation kept you from medical appointments or from getting medications?: No    Lack of Transportation (Non-Medical): No  Physical Activity: Not on file  Stress: Not on file  Social Connections: Not on file  Intimate Partner Violence: Not At Risk (01/01/2023)   Humiliation, Afraid, Rape, and Kick questionnaire    Fear of Current or Ex-Partner: No    Emotionally Abused: No    Physically Abused: No    Sexually Abused: No    FH:  Family History  Problem Relation Age of Onset   Miscarriages / Stillbirths Mother    Heart disease Father    Hypertension Father    Depression Daughter    Alcohol abuse Maternal Grandfather    Cancer Maternal Grandfather    Depression Maternal Grandfather    Hypertension Maternal Grandfather    Stroke Maternal Grandfather    Cancer Maternal Grandmother    Hypertension Maternal Grandmother     Past Medical History:  Diagnosis Date   Acute CVA (cerebrovascular accident) (HCC) 10/04/2022   Acute ST elevation myocardial infarction (STEMI) involving left anterior descending (LAD) coronary artery (HCC) 08/06/2019   Anxiety 09/2022   After stroke   CAD (coronary artery disease) 10/04/2022   Cerebrovascular accident (CVA) (HCC) 10/07/2022   Chest pain    CHF (congestive heart failure) (HCC)    Cocaine abuse (HCC)    Cocaine use 10/06/2022   Depression December 2023   When stroke happened   Elevated troponin    Encounter for imaging to screen for metal prior to MRI 10/07/2022   Healthcare maintenance 10/07/2023   Heavy smoker    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 question possible MS   NSTEMI (non-ST elevated  myocardial infarction) (HCC) 05/25/2015   Obesity    Polysubstance abuse (HCC)    a. tobacco, cocaine, crack, ecstasy, pills, anything but injections   Primary hypertension 10/05/2022   Sleep apnea    Always   STEMI (ST elevation myocardial infarction) (HCC) 08/06/2019    Current Outpatient Medications  Medication Sig Dispense Refill   aspirin  81 MG chewable tablet Chew 1 tablet (81 mg total) by mouth daily. 30 tablet 5   atorvastatin  (LIPITOR ) 80 MG tablet Take 1 tablet (80 mg total) by mouth daily at 6 PM. 90 tablet 0   clopidogrel  (PLAVIX ) 75 MG tablet Take 1 tablet (75 mg total) by mouth daily with breakfast. 30 tablet 5   dapagliflozin  propanediol (FARXIGA ) 10 MG TABS tablet Take 1 tablet (10 mg total) by mouth daily. NEEDS FOLLOW UP APPOINTMENT FOR MORE REFILLS  90 tablet 0   DULoxetine  (CYMBALTA ) 60 MG capsule Take 1 capsule (60 mg total) by mouth daily. (Patient taking differently: Take 60 mg by mouth daily. Every other day) 90 capsule 0   metoprolol  succinate (TOPROL -XL) 25 MG 24 hr tablet Take 1 tablet (25 mg total) by mouth daily. Dose change 90 tablet 0   spironolactone  (ALDACTONE ) 25 MG tablet Take 1 tablet (25 mg total) by mouth daily. 90 tablet 3   traZODone  (DESYREL ) 50 MG tablet Take 0.5-2 tablets (25-100 mg total) by mouth at bedtime as needed. 60 tablet 0   valsartan  (DIOVAN ) 40 MG tablet Take 1 tablet (40 mg total) by mouth daily. 30 tablet 5   No current facility-administered medications for this visit.   Vitals:   08/23/24 1421  BP: 123/83  Pulse: 80  SpO2: 98%  Weight: 129.5 kg  Height: 6' 4 (1.93 m)   Wt Readings from Last 3 Encounters:  08/23/24 129.5 kg  08/03/24 130.6 kg  07/19/24 130.6 kg    Lab Results  Component Value Date   CREATININE 0.89 07/25/2024   CREATININE 0.84 07/12/2024   CREATININE 0.79 05/19/2024     PHYSICAL EXAM:  General: Well appearing. No acute signs of distress.  Cor: No JVD. Regular rhythm, rate.  Lungs: Clear  bilaterally. Symmetrical chest expansion. No cough.  Abdomen: Soft, nontender, nondistended. Extremities: No edema., cyanosis, or rash.  Neuro:. AO x3. Affect pleasant. Right arm/ leg weakness.     ECG: not done   ASSESSMENT & PLAN:  1: Ischemic heart failure with reduced ejection fraction- - NYHA class II - euvolemic - weighing daily - weight down 2 pounds from last visit here 1 month ago  - maintain dietary modifications, limit salt intake 2000mg  to daily and limit intake of juice/soda  - TEE 10/08/22: EF 20-25%, no atrial thrombus, mild Ricardo. Echo 10/05/22: EF <20%, mild LAE/RAE and mild Ricardo - echo scheduled for 11/25 - continue farxiga  10mg  daily - continue metoprolol  succinate 25mg  daily - continue valsartan  40mg  daily - continue spironolactone  25mg  daily. - BMET today  2: HTN- - BP 123/83 - saw PCP [place]) 09/25 - BMP 07/25/24 reviewed: sodium 138, potassium 3.9, creatinine 0.89 & GFR >60 - BMET today  3: CAD- - saw cardiology ([place])) 10/24. Referral placed today for him to schedule an appointment - continue clopidogrel  75mg  daily - continue ASA 81mg  daily LHC 06/22: Mid RCA lesion is 50% stenosed. 2nd Mrg lesion is 100% stenosed. Ost LAD to Prox LAD lesion is 20% stenosed.  Non-ST elevation myocardial infarction Patent stent proximal LAD  Distal occlusion small to moderate caliber OM 2  Mild to moderate reduced left ventricular function with apical dyskinesis  4: Stroke- - unable to grasp things small with his right hand (dominant hand is his right) - discussed use of hand brace for hand support  - saw neurology [place]) 08/25  5: Substance use- - using alcohol  - continues to use marijuana daily  - has decreased smoking to 1-2 cigarettes every other day - encouraged smoking cessation - denies any cocaine use since the week before his stroke 12/23  6 Hyperlipidemia- - continue atorvastatin  80mg  daily - LDL 07/12/24 was 47   Return in 4-6 months,  sooner if needed.    Ellouise Class, FNP / Solomon Neizan Debruhl, FNP-S 08/23/24

## 2024-08-24 ENCOUNTER — Ambulatory Visit: Payer: Self-pay | Admitting: Family

## 2024-08-24 LAB — BASIC METABOLIC PANEL WITH GFR
BUN/Creatinine Ratio: 11 (ref 9–20)
BUN: 10 mg/dL (ref 6–24)
CO2: 22 mmol/L (ref 20–29)
Calcium: 9.5 mg/dL (ref 8.7–10.2)
Chloride: 102 mmol/L (ref 96–106)
Creatinine, Ser: 0.95 mg/dL (ref 0.76–1.27)
Glucose: 82 mg/dL (ref 70–99)
Potassium: 4.2 mmol/L (ref 3.5–5.2)
Sodium: 140 mmol/L (ref 134–144)
eGFR: 98 mL/min/1.73 (ref >59)

## 2024-08-25 ENCOUNTER — Ambulatory Visit
Admission: RE | Admit: 2024-08-25 | Discharge: 2024-08-25 | Disposition: A | Source: Ambulatory Visit | Attending: Radiation Oncology | Admitting: Radiation Oncology

## 2024-08-25 ENCOUNTER — Other Ambulatory Visit: Payer: Self-pay

## 2024-08-25 DIAGNOSIS — Z510 Encounter for antineoplastic radiation therapy: Secondary | ICD-10-CM | POA: Diagnosis not present

## 2024-08-25 LAB — RAD ONC ARIA SESSION SUMMARY
Course Elapsed Days: 9
Plan Fractions Treated to Date: 4
Plan Prescribed Dose Per Fraction: 0.5 Gy
Plan Total Fractions Prescribed: 6
Plan Total Prescribed Dose: 3 Gy
Reference Point Dosage Given to Date: 2 Gy
Reference Point Session Dosage Given: 0.5 Gy
Session Number: 4

## 2024-08-29 ENCOUNTER — Telehealth: Payer: Self-pay

## 2024-08-29 ENCOUNTER — Ambulatory Visit

## 2024-08-29 VITALS — BP 122/70 | Ht 76.0 in | Wt 286.4 lb

## 2024-08-29 DIAGNOSIS — I5020 Unspecified systolic (congestive) heart failure: Secondary | ICD-10-CM | POA: Diagnosis not present

## 2024-08-29 DIAGNOSIS — E782 Mixed hyperlipidemia: Secondary | ICD-10-CM | POA: Insufficient documentation

## 2024-08-29 DIAGNOSIS — Z9861 Coronary angioplasty status: Secondary | ICD-10-CM | POA: Insufficient documentation

## 2024-08-29 DIAGNOSIS — I2510 Atherosclerotic heart disease of native coronary artery without angina pectoris: Secondary | ICD-10-CM | POA: Insufficient documentation

## 2024-08-29 NOTE — Telephone Encounter (Signed)
 Office note from today indicating request to hold Plavix  faxed to dental office.

## 2024-08-29 NOTE — Telephone Encounter (Signed)
   Pre-operative Risk Assessment    Patient Name: Ricardo Yang  DOB: [birth year] MRN: [id number]   Date of last office visit: 08/29/2024 Date of next office visit: n/a  Request for Surgical Clearance    Procedure:  Dental Extraction - Amount of Teeth to be Pulled:  8  Date of Surgery:  Clearance TBD                                Surgeon:  Not indicated Surgeon's Group or Practice Name:  [facility] Phone number:  [phone] Fax number:  [phone]   Type of Clearance Requested:   - Pharmacy:  Hold Clopidogrel  (Plavix )     Type of Anesthesia:  Not Indicated   Additional requests/questions:    SignedTinnie NOVAK Schools   08/29/2024, 10:48 AM

## 2024-08-29 NOTE — Patient Instructions (Addendum)
 Medication Instructions:  Your physician recommends the following medication changes.  STOP TAKING: PLAVIX    CONTINUE ASPIRIN  81 MG ONCE DAILY AND ALL YOUR OTHER CURRENT MEDICATIONS AS PRESCRIBED  *If you need a refill on your cardiac medications before your next appointment, please call your pharmacy*  Lab Work: No labs ordered today  If you have labs (blood work) drawn today and your tests are completely normal, you will receive your results only by: MyChart Message (if you have MyChart) OR A paper copy in the mail If you have any lab test that is abnormal or we need to change your treatment, we will call you to review the results.  Testing/Procedures: No test ordered today   Follow-Up:  REFERRAL to PHARM-D for weight loss medications REFERRAL to EP  for ICD consult   At [facility], you and your health needs are our priority.  As part of our continuing mission to provide you with exceptional heart care, our providers are all part of one team.  This team includes your primary Cardiologist (physician) and Advanced Practice Providers or APPs (Physician Assistants and Nurse Practitioners) who all work together to provide you with the care you need, when you need it.  Your next appointment:   6 month(s)  Provider:   Caron Poser, MD    We recommend signing up for the patient portal called MyChart.  Sign up information is provided on this After Visit Summary.  MyChart is used to connect with patients for Virtual Visits (Telemedicine).  Patients are able to view lab/test results, encounter notes, upcoming appointments, etc.  Non-urgent messages can be sent to your provider as well.   To learn more about what you can do with MyChart, go to [email or web address].   Other Instructions For Tooth Extraction

## 2024-08-30 ENCOUNTER — Ambulatory Visit
Admission: RE | Admit: 2024-08-30 | Discharge: 2024-08-30 | Disposition: A | Discharge status: Home / Self Care | Source: Ambulatory Visit | Attending: Radiation Oncology | Admitting: Radiation Oncology

## 2024-08-30 ENCOUNTER — Other Ambulatory Visit: Payer: Self-pay

## 2024-08-30 DIAGNOSIS — M1612 Unilateral primary osteoarthritis, left hip: Secondary | ICD-10-CM | POA: Diagnosis present

## 2024-08-30 DIAGNOSIS — Z510 Encounter for antineoplastic radiation therapy: Secondary | ICD-10-CM | POA: Insufficient documentation

## 2024-08-30 LAB — RAD ONC ARIA SESSION SUMMARY
Course Elapsed Days: 14
Plan Fractions Treated to Date: 5
Plan Prescribed Dose Per Fraction: 0.5 Gy
Plan Total Fractions Prescribed: 6
Plan Total Prescribed Dose: 3 Gy
Reference Point Dosage Given to Date: 2.5 Gy
Reference Point Session Dosage Given: 0.5 Gy
Session Number: 5

## 2024-09-01 ENCOUNTER — Other Ambulatory Visit: Payer: Self-pay

## 2024-09-01 ENCOUNTER — Ambulatory Visit
Admission: RE | Admit: 2024-09-01 | Discharge: 2024-09-01 | Disposition: A | Source: Ambulatory Visit | Attending: Radiation Oncology | Admitting: Radiation Oncology

## 2024-09-01 DIAGNOSIS — Z510 Encounter for antineoplastic radiation therapy: Secondary | ICD-10-CM | POA: Diagnosis not present

## 2024-09-01 LAB — RAD ONC ARIA SESSION SUMMARY
Course Elapsed Days: 16
Plan Fractions Treated to Date: 6
Plan Prescribed Dose Per Fraction: 0.5 Gy
Plan Total Fractions Prescribed: 6
Plan Total Prescribed Dose: 3 Gy
Reference Point Dosage Given to Date: 3 Gy
Reference Point Session Dosage Given: 0.5 Gy
Session Number: 6

## 2024-09-02 NOTE — Radiation Completion Notes (Signed)
 Patient Name: Summerall, Jeferson MRN: [id number] Date of Birth: 1976-05-15 Referring Physician: SELINDA KU, M.D. Date of Service: 2024-09-02 Radiation Oncologist: Marcey Penton, M.D. [facility]                             RADIATION ONCOLOGY END OF TREATMENT NOTE     Diagnosis: M17.11 Unilateral primary osteoarthritis, right knee Intent: Curative     HPI: Patient is a 48 year old male whose had problems with his right knee since high school.  Pain has intensified recently up to 7 out of 10 on a pain scale.  It eases off somewhat when he is sitting.  He had a recent MRI scan of his right knee showing tricompartmental osteoarthritis most notable of the lateral compartment and lateral patellofemoral compartment.  There is significant chondromalacia with areas of full-thickness cartilage loss.  There is also moderate suprapatellar joint effusion.  There is also a complex tear of the posterior horn and body of the lateral meniscus.  He is wearing a soft brace.      ==========DELIVERED PLANS==========  First Treatment Date: 2024-08-16 Last Treatment Date: 2024-09-01   Plan Name: Ext_Rt_Knee Site: Knee, Right Technique: Isodose Plan Mode: Photon Dose Per Fraction: 0.5 Gy Prescribed Dose (Delivered / Prescribed): 3 Gy / 3 Gy Prescribed Fxs (Delivered / Prescribed): 6 / 6     ==========ON TREATMENT VISIT DATES========== 2024-08-23, 2024-08-30     ==========UPCOMING VISITS========== 10/13/2024 [facility] OFFICE VISIT Geraldine Eleanor BIRCH, RPH-CPP  10/05/2024 [facility] RAD ONCOLOGY FOLLOW UP 30 Penton Marcey, MD  09/13/2024 [facility] ECHOCARDIOGRAM [facility] US  2        ==========APPENDIX - ON TREATMENT VISIT NOTES==========   See weekly On Treatment Notes in Epic for details in the Media tab (listed as Progress notes on the On Treatment Visit Dates listed above).

## 2024-09-13 ENCOUNTER — Ambulatory Visit: Admitting: Neurology

## 2024-09-13 ENCOUNTER — Ambulatory Visit: Attending: Family

## 2024-09-13 DIAGNOSIS — I5020 Unspecified systolic (congestive) heart failure: Secondary | ICD-10-CM | POA: Insufficient documentation

## 2024-09-13 LAB — ECHOCARDIOGRAM COMPLETE: S' Lateral: 5.54 cm

## 2024-09-13 MED ORDER — PERFLUTREN LIPID MICROSPHERE
1.0000 mL | INTRAVENOUS | Status: AC | PRN
  Administered 2024-09-13: 3 mL via INTRAVENOUS

## 2024-09-14 ENCOUNTER — Ambulatory Visit: Payer: Self-pay | Admitting: Family

## 2024-09-27 ENCOUNTER — Telehealth: Payer: Self-pay | Admitting: Family

## 2024-09-27 NOTE — Progress Notes (Unsigned)
 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   LHC 06/22: Mid RCA lesion is 50% stenosed. 2nd Mrg lesion is 100% stenosed. Ost LAD to Prox LAD lesion is 20% stenosed.  1.  Non-ST elevation myocardial infarction 2.  Patent stent proximal LAD 3.  Distal occlusion small to moderate caliber OM 2 4.  Mild to moderate reduced left ventricular function with apical dyskinesis  Admitted 10/04/22 due to  acute onset of aphasia and right-sided weakness with leftward gaze. MRI showed acute infarct in the left ACA, left MCA and likely of the right PCA territory with a large infarct involving the left postcentral gyrus. Transferred to rehab. TEE 10/08/22: EF 20-25%, no atrial thrombus, mild Ricardo. Echo 10/05/22: EF <20%, mild LAE/RAE and mild Ricardo  Was in the ED 11/27/22 due to right hand swelling. Ultrasound negative for DVT.   Was in the ER 05/19/24 following a mechanical fall. Unclear of what surrounded the fall. CT negative for acute stroke. Shoulder xray was negative.   Echo 09/13/24: EF 20-25%, G1DD, mildly reduced RV, global hypokinesis  He presents today, with his mom, for a HF follow up visit with minimal shortness of breath. Has occasional palpitations, dizziness. Usually sleeps well but admits that he didn't sleep well last night.   Uses marijuana daily and uses cigarettes 1-2 every other day. Alcohol use 2-3 times a week, between 2-5 shots each time.   ROS: All systems negative except as listed in HPI, PMH and Problem List.  SH:  Social History   Socioeconomic History   Marital status: Single    Spouse name: Not on file   Number of children: Not on file   Years of  education: Not on file   Highest education level: Not on file  Occupational History   Not on file  Tobacco Use   Smoking status: Some Days    Current packs/day: 0.25    Average packs/day: 0.3 packs/day for 15.0 years (3.8 ttl pk-yrs)    Types: Cigarettes   Smokeless tobacco: Never   Tobacco comments:    Started as a teenager  Vaping Use   Vaping status: Never Used  Substance and Sexual Activity   Alcohol use: Not Currently    Alcohol/week: 1.0 standard drink of alcohol    Types: 1 Shots of liquor per week   Drug use: Yes    Frequency: 1.0 times per week    Types: Cocaine, Marijuana    Comment: 01/26/23 current marijuana no cocaine   Sexual activity: Not Currently    Partners: Female    Birth control/protection: Abstinence, Condom  Other Topics Concern   Not on file  Social History Narrative   Not on file   Social Drivers of Health   Financial Resource Strain: High Risk (06/02/2024)   Received from [facility]   Overall Financial Resource Strain (CARDIA)    Difficulty of Paying Living Expenses: Hard  Food Insecurity: Food Insecurity Present (06/02/2024)   Received [facility]   Hunger Vital Sign    Within the past 12 months, you worried that your food would run out before you got the money to buy more.: Sometimes true  Within the past 12 months, the food you bought just didn't last and you didn't have money to get more.: Sometimes true  Transportation Needs: No Transportation Needs (06/02/2024)   Received from [facility] - Transportation    In the past 12 months, has lack of transportation kept you from medical appointments or from getting medications?: No    Lack of Transportation (Non-Medical): No  Physical Activity: Not on file  Stress: Not on file  Social Connections: Not on file  Intimate Partner Violence: Not At Risk (01/01/2023)   Humiliation, Afraid, Rape, and Kick questionnaire    Fear of Current  or Ex-Partner: No    Emotionally Abused: No    Physically Abused: No    Sexually Abused: No    FH:  Family History  Problem Relation Age of Onset   Miscarriages / Stillbirths Mother    Heart disease Father    Hypertension Father    Depression Daughter    Alcohol abuse Maternal Grandfather    Cancer Maternal Grandfather    Depression Maternal Grandfather    Hypertension Maternal Grandfather    Stroke Maternal Grandfather    Cancer Maternal Grandmother    Hypertension Maternal Grandmother     Past Medical History:  Diagnosis Date   Acute CVA (cerebrovascular accident) (HCC) 10/04/2022   Acute ST elevation myocardial infarction (STEMI) involving left anterior descending (LAD) coronary artery (HCC) 08/06/2019   Anxiety 09/2022   After stroke   CAD (coronary artery disease) 10/04/2022   Cerebrovascular accident (CVA) (HCC) 10/07/2022   Chest pain    CHF (congestive heart failure) (HCC)    Cocaine abuse (HCC)    Cocaine use 10/06/2022   Depression December 2023   When stroke happened   Elevated troponin    Encounter for imaging to screen for metal prior to MRI 10/07/2022   Healthcare maintenance 10/07/2023   Heavy smoker    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 question possible MS   NSTEMI (non-ST elevated myocardial infarction) (HCC) 05/25/2015   Obesity    Polysubstance abuse (HCC)    a. tobacco, cocaine, crack, ecstasy, pills, anything but injections   Primary hypertension 10/05/2022   Sleep apnea    Always   STEMI (ST elevation myocardial infarction) (HCC) 08/06/2019    Current Outpatient Medications  Medication Sig Dispense Refill   aspirin  81 MG chewable tablet Chew 1 tablet (81 mg total) by mouth daily. 30 tablet 5   atorvastatin  (LIPITOR ) 80 MG tablet Take 1 tablet (80 mg total) by mouth daily at 6 PM. 90 tablet 0   dapagliflozin  propanediol (FARXIGA ) 10 MG TABS tablet Take 1 tablet (10 mg total) by mouth daily.  NEEDS FOLLOW UP APPOINTMENT FOR MORE REFILLS 90 tablet 0   DULoxetine  (CYMBALTA ) 60 MG capsule Take 1 capsule (60 mg total) by mouth daily. (Patient taking differently: Take 60 mg by mouth daily. Every other day) 90 capsule 0   metoprolol  succinate (TOPROL -XL) 25 MG 24 hr tablet Take 1 tablet (25 mg total) by mouth daily. Dose change 90 tablet 0   spironolactone  (ALDACTONE ) 25 MG tablet Take 1 tablet (25 mg total) by mouth daily. 90 tablet 3   traZODone  (DESYREL ) 50 MG tablet Take 0.5-2 tablets (25-100 mg total) by mouth at bedtime as needed. 60 tablet 0   valsartan  (DIOVAN ) 40 MG tablet Take 1 tablet (40 mg total) by mouth daily. 30 tablet 5  No current facility-administered medications for this visit.   Vitals:   09/28/24 1024 09/28/24 1028  BP: (!) 132/100 (!) 122/94  Pulse: 79   SpO2: 100%   Weight: 282 lb (127.9 kg)    Wt Readings from Last 3 Encounters:  09/28/24 282 lb (127.9 kg)  08/29/24 286 lb 6.4 oz (129.9 kg)  08/23/24 285 lb 6.4 oz (129.5 kg)   Lab Results  Component Value Date   CREATININE 0.95 08/23/2024   CREATININE 0.89 07/25/2024   CREATININE 0.84 07/12/2024    PHYSICAL EXAM:  General: Well appearing. NAD Cor: No JVD. Regular rhythm, rate.  Lungs: clear Abdomen: soft, nontender, nondistended. Extremities: no edema Neuro:. Affect pleasant. Right arm/ leg weakness   ECG: not done   ASSESSMENT & PLAN:  1: Ischemic heart failure with reduced ejection fraction- - NYHA Yang II - euvolemic - weight down 3 pounds from last visit here 6 weeks ago  - maintain dietary modifications, limit salt intake 2000mg  to daily and limit intake of juice/soda  - Echo 10/05/22: EF <20%, mild LAE/RAE and mild Ricardo - TEE 10/08/22: EF 20-25%, no atrial thrombus, mild Ricardo.  - Echo 09/13/24: EF 20-25%, G1DD, mildly reduced RV, global hypokinesis.  - Reviewed echo results with patient and mom. Due to continued low EF with his ischemic HF, reviewed recommendations for ICD  consideration. They are both agreeable to EP referral so this was placed today - continue farxiga  10mg  daily - continue metoprolol  succinate 25mg  daily - continue spironolactone  25mg  daily. - Stop valsartan  and begin entresto 49/51mg  BID. Emphasized that he must take this BID and he says that he can, he will just have to get in the habit. Should he find that he misses the PM dose frequently, will need to put him back on valsartan  and increase dose to 80mg . - BMET next visit  2: HTN- - BP 122/94 after recheck. Changing valsartan  to entresto per above - saw PCP Ricardo Yang) 09/25 - BMP 08/23/24 reviewed: sodium 140, potassium 4.2, creatinine 0.95 & GFR 98 - check BMET at next visit  3: CAD- - saw cardiology Ricardo Yang) 11/25 - continue ASA 81mg  daily LHC 06/22: Mid RCA lesion is 50% stenosed. 2nd Mrg lesion is 100% stenosed. Ost LAD to Prox LAD lesion is 20% stenosed.  Non-ST elevation myocardial infarction Patent stent proximal LAD  Distal occlusion small to moderate caliber OM 2  Mild to moderate reduced left ventricular function with apical dyskinesis  4: Stroke- - unable to grasp things small with his right hand (dominant hand is his right) - saw neurology Ricardo Yang) 08/25  5: Substance use- - using alcohol  - continues to use marijuana daily  - has decreased smoking to 1-2 cigarettes every other day - encouraged smoking cessation - denies any cocaine use since the week before his stroke 12/23  6 Hyperlipidemia- - continue atorvastatin  80mg  daily - LDL 07/12/24 was 47   Return in 1 month, sooner if needed.   I spent 40 minutes reviewing records, interviewing/ examing patient and managing plan/ orders.   Ricardo Yang [facility] 09/28/24

## 2024-09-27 NOTE — Telephone Encounter (Signed)
 Called to confirm/remind patient of their appointment at the [facility] on 09/28/24.   Appointment:   [x] Confirmed  [] Left mess   [] No answer/No voice mail  [] VM Full/unable to leave message  [] Phone not in service  Patient reminded to bring all medications and/or complete list.  Confirmed patient has transportation. Gave directions, instructed to utilize valet parking.

## 2024-09-28 ENCOUNTER — Other Ambulatory Visit: Payer: Self-pay

## 2024-09-28 ENCOUNTER — Encounter: Payer: Self-pay | Admitting: Family

## 2024-09-28 ENCOUNTER — Ambulatory Visit: Attending: Family | Admitting: Family

## 2024-09-28 VITALS — BP 122/94 | HR 79 | Wt 282.0 lb

## 2024-09-28 DIAGNOSIS — I10 Essential (primary) hypertension: Secondary | ICD-10-CM

## 2024-09-28 DIAGNOSIS — I5020 Unspecified systolic (congestive) heart failure: Secondary | ICD-10-CM

## 2024-09-28 DIAGNOSIS — Z9861 Coronary angioplasty status: Secondary | ICD-10-CM

## 2024-09-28 DIAGNOSIS — I2510 Atherosclerotic heart disease of native coronary artery without angina pectoris: Secondary | ICD-10-CM

## 2024-09-28 DIAGNOSIS — I6930 Unspecified sequelae of cerebral infarction: Secondary | ICD-10-CM | POA: Diagnosis not present

## 2024-09-28 DIAGNOSIS — F1910 Other psychoactive substance abuse, uncomplicated: Secondary | ICD-10-CM

## 2024-09-28 DIAGNOSIS — E782 Mixed hyperlipidemia: Secondary | ICD-10-CM

## 2024-09-28 MED ORDER — SACUBITRIL-VALSARTAN 49-51 MG PO TABS
1.0000 | ORAL_TABLET | Freq: Two times a day (BID) | ORAL | 6 refills | Status: DC
  Filled 2024-09-28: qty 60, 30d supply, fill #0
  Filled 2024-10-31: qty 60, 30d supply, fill #1

## 2024-09-28 NOTE — Patient Instructions (Signed)
 Medication Changes:  STOP Valsartan    START Entresto  49-51mg  (1 tab) two times daily    Referrals:  You have been referred to [facility]. They will contact you in order to schedule an appointment.    Follow-Up in: Please follow up with the [facility] in 1 month with Ellouise Class, FNP   Thank you for choosing [facility].    At the [facility], you and your health needs are our priority. We have a designated team specialized in the treatment of Heart Failure. This Care Team includes your primary Heart Failure Specialized Cardiologist (physician), Advanced Practice Providers (APPs- Physician Assistants and Nurse Practitioners), and Pharmacist who all work together to provide you with the care you need, when you need it.   You may see any of the following providers on your designated Care Team at your next follow up:  Dr. Toribio Fuel Dr. Ezra Shuck Dr. Ria Commander Dr. Morene Brownie Ellouise Class, FNP Jaun Bash, RPH-CPP  Please be sure to bring in all your medications bottles to every appointment.   Need to Contact Us :  If you have any questions or concerns before your next appointment please send us  a message through [place] or call our office at [phone].    TO LEAVE A MESSAGE FOR THE NURSE SELECT OPTION 2, PLEASE LEAVE A MESSAGE INCLUDING: YOUR NAME DATE OF BIRTH CALL BACK NUMBER REASON FOR CALL**this is important as we prioritize the call backs  YOU WILL RECEIVE A CALL BACK THE SAME DAY AS LONG AS YOU CALL BEFORE 4:00 PM

## 2024-10-03 ENCOUNTER — Other Ambulatory Visit: Payer: Self-pay

## 2024-10-05 ENCOUNTER — Encounter: Payer: Self-pay | Admitting: Radiation Oncology

## 2024-10-05 ENCOUNTER — Ambulatory Visit
Admission: RE | Admit: 2024-10-05 | Discharge: 2024-10-05 | Disposition: A | Discharge status: Home / Self Care | Source: Ambulatory Visit | Attending: Radiation Oncology | Admitting: Radiation Oncology

## 2024-10-05 VITALS — BP 137/96 | HR 82 | Temp 98.6°F | Resp 20 | Ht 76.0 in | Wt 281.9 lb

## 2024-10-05 DIAGNOSIS — M1612 Unilateral primary osteoarthritis, left hip: Secondary | ICD-10-CM

## 2024-10-05 NOTE — Progress Notes (Signed)
 Radiation Oncology Follow up Note  Name: Ricardo Yang   Date:   10/05/2024 MRN:  [id number] DOB: [birth year]    This 48 y.o. male presents to the clinic today for 1 month follow-up status post LDR 4 osteoarthritis of the right knee.  REFERRING PROVIDER: Alvia Selinda PARAS, MD  HPI: Patient is a 48 year old male now out 1 month having received LDR for osteoarthritis of the right knee.  Seen today in routine follow-up he is doing well.  He states he has approximately 80% relief of pain most of the times it is a level 1 out of 10.  He is extremely pleased he is also had improved flexion of the knee.  He has no side effects..  COMPLICATIONS OF TREATMENT: none  FOLLOW UP COMPLIANCE: keeps appointments   PHYSICAL EXAM:  BP (!) 137/96   Pulse 82   Temp 98.6 F (37 C)   Resp 20   Ht 6' 4 (1.93 m)   Wt 281 lb 14.4 oz (127.9 kg)   SpO2 100%   BMI 34.31 kg/m  Range of motion of the right knee shows good flexion.  Motor and sensory levels in the lower extremities are equal and symmetric.  No pain is elicited on rotation or flexion of the knee.  No skin reaction  RADIOLOGY RESULTS: No current films for review  PLAN: Present time patient's had an excellent response as far as pain and improve mobility of his right knee.  I have asked to see him back in 6 months and then we will turn follow-up care over to his PMD.  Patient is to call with any concerns.  He is extremely pleased with his results.  I would like to take this opportunity to thank you for allowing me to participate in the care of your patient.SABRA Marcey Penton, MD

## 2024-10-06 NOTE — Progress Notes (Signed)
 Today the history is gathered from: 100% - patient  0% - alone today  RECORDS SUMMARY: I have reviewed the note dated 05/25/2024 from Dr. Alvia who has indicated:  stroke  Given these abnormal neurologic findings, a referral to neurology has been recommended.  REFERRING PHYSICIAN: Alvia Selinda PARAS, MD PRIMARY CARE PHYSICIAN:  Provider   IMPRESSION/PLAN  Ricardo Yang is a 48 y.o. 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 No new stroke-like symptoms reported. Completed physical and occupational therapy with interest in further therapy for right-sided weakness and hand function. Speech therapy is ongoing with improvement noted with conversation. 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 No contact from [facility] for Botox consultation.  - Resend referral to [facility] for consult for Botox for right hand contracture  Heart failure with reduced ejection fraction Heart failure with reduced ejection fraction, ejection fraction at 25%. Recent medication adjustment by cardiology, including discontinuation of clopidogrel  and initiation of Entresto . No issues reported with new medication regimen. - Continue Entresto  as prescribed by cardiology - Monitor blood pressure regularly - Follow up with cardiology as needed  Shoulder osteoarthritis Pain. No fracture or dislocation noted on imaging. Interest in orthopedic evaluation for potential interventions such as injections. - Referred to orthopedics for evaluation and potential interventions for shoulder osteoarthritis  Insomnia Managed with trazodone . Reports effectiveness with two pills but  difficulty waking up. Considering trial of one and a half pills for optimal balance. - Consider trial of one and a half trazodone  pills for sleep  Major depressive disorder Managed with duloxetine  60 mg. Reports mood stabilization with medication. Emotional lability noted when doses are missed. - Continue duloxetine  60 mg daily - Ensure medication supply is adequate to prevent missed doses  Follow-up 45mo  Recording duration: 16 minutes  Medications previously tried:   CHIEF COMPLAINT & HPI  Ricardo Yang is a 48 y.o. male presenting for evaluation of: Chief Complaint  Patient presents with   HX OF STROKE/ RIGHT SIDED WEAKNESS   History of Present Illness Mindy Brazell is a 48 year old male with a history of stroke who presents with residual speech difficulty and right-sided hemiparesis.  He experienced a stroke in December 2023, resulting in residual speech difficulty and right-sided hemiparesis. His speech improves with more conversation, although he still stumbles over words when speaking for extended periods. He has completed physical and occupational therapy but prefers in-person sessions for better effectiveness. He has resumed playing video games to aid in hand coordination. He is currently on aspirin , having been taken off clopidogrel , and is also on atorvastatin  90 mg once daily. No new stroke-like symptoms have been reported, and he has been stable since his last visit.  He has a history of heart failure with reduced ejection fraction, with his heart functioning at about 25%. He was recently started on Entresto , which he takes twice daily, and continues on spironolactone . He reports no issues with the new medication as long as he remembers to take it.  He takes duloxetine  60 mg for mood stabilization and trazodone  for sleep. Missing a dose of duloxetine  affects his mood, making him emotional and quick-tempered, as observed by his mother. Trazodone  is effective for sleep when he takes  enough of it, though he sometimes struggles to fall asleep if engaged in interesting activities.  He has a history of knee pain, which has improved following radiation treatment, allowing him to be more active and walk more frequently. He believes increased activity will help with weight loss and overall health improvement.  He reports ongoing shoulder pain, and his PCP told him it is due to arthritis.  He has not yet seen orthopedics.   DATA SUMMARY: 05/25/2024 MR BRAIN WO CONTRAST IMPRESSION: 1. No acute intracranial abnormality.  2. Chronic encephalomalacia changes at the left frontal parietal junction.  3. Chronic small focal cortical infarct in the left posterior temporal lobe.  4. Small chronic cortical infarct in the right occipital lobe.  5. Mild ex vacuo dilatation of the posterior horn of the left lateral ventricle.   05/20/2024 CT HEAD WO CONTRAST IMPRESSION:  No acute intracranial abnormality.   10/04/2022 MR BRAIN WO CONTRAST IMPRESSION:  1. Acute infarcts in the left ACA, left MCA, and likely the right  PCA territory, with a large infarct involving the left postcentral  gyrus. Given distribution, these are favored to represent infarcts  related to a central embolic etiology. There is a focal region in  the right occipital lobe where there is likely a small amount of  subarachnoid blood products, which could suggest an underlying  component of vasculitis. Recommend further evaluation with  echocardiography.  2. Redemonstrated periventricular and subcortical T2/FLAIR  hyperintense signal, as well as at the root entry zone of the right  trigeminal nerve, which are suggestive of an underlying  demyelinating disease.   10/04/2022 CT ANGIO HEAD NECK W/WO CM IMPRESSION:  1. Suboptimal although adequate contrast bolus.  Strong evidence on CTP of a small posterior Left MCA territory  infarct core (approximately 12 mL).  CTP also suggests approximately 60 mL of  oligemia in the Left MCA  territory.  However, there is NO large vessel occlusion, and no discrete MCA  branch occlusion detected on CTA.   2. No atherosclerosis, stenosis, or arterial abnormality identified  in the head or neck.  There is a left coronary artery stent visible in the upper chest.   3. Mild upper lung atelectasis and small volume retained secretions  in the trachea.   10/04/2022 CT HEAD CODE STROKE WO CONTRAST IMPRESSION:  Leftward gaze, but no acute cortically based infarct or intracranial  hemorrhage identified. And subtle if any left MCA vessel asymmetry.  ASPECTS 10.   05/22/2009 MR CERVICAL SPINE W/WO CONTRAST IMPRESSION:  1. Left paracentral/foraminal degenerative endplate osteophyte formation  and  disc protrusion at C5-C6 with narrowing of the left neural foramen at this  level.  2. Intramedullary signal abnormality within the central to posterior  aspect  of the cervical cord at C3-C4 level. Mild enhancement is noted.  Differential  diagnosis includes demyelinating disease such as MS, tumor, and myelitis.   05/21/2009 MR BRAIN W/WO CONTRAST IMPRESSION:  1.  No acute abnormality identified. No evidence of ischemia. The  posterior  fossa including the eighth nerve complexes are normal.  2.  Deep white matter changes are noted consistent with chronic ischemia.  Other etiologies of white matter disease cannot be excluded. This would  include MS, vasculitis and migraine headaches.  3.  Not mentioned above is mucosal thickening noted in the right portion  of  the sphenoid sinus suggesting mild changes of chronic sinusitis.   05/21/2009 MR MRA HEADM IMPRESSION:  Negative exam.   05/20/2009 CT HEAD LIMITED  W/O CM IMPRESSION:  1. No acute intracranial abnormality.  2. Right sphenoid sinusitis.   VISIT SUMMARIES:   MEDICATIONS Current Outpatient Medications  Medication Sig Dispense Refill   aspirin  81 MG chewable tablet Take 81 mg by mouth  once daily     atorvastatin  (LIPITOR ) 80 MG tablet Take 1 tablet (80 mg total) by mouth at bedtime 30 tablet 1   clopidogreL  (PLAVIX ) 75 mg tablet Take 1 tablet (75 mg total) by mouth once daily 30 tablet 1   dapagliflozin  propanediol (FARXIGA ) 10 mg tablet Take 1 tablet (10 mg total) by mouth once daily 30 tablet 1   lisinopriL  (ZESTRIL ) 5 MG tablet Take 1 tablet (5 mg total) by mouth once daily (Patient not taking: Reported on 06/02/2024) 30 tablet 1   metoprolol  succinate (TOPROL -XL) 25 MG XL tablet Take 1 tablet (25 mg total) by mouth once daily 30 tablet 1   miscellaneous medical supply Misc 4 point cane 1 each 0   valsartan  (DIOVAN ) 40 MG tablet Take 40 mg by mouth once daily     No current facility-administered medications for this visit.   ALLERGIES Not on File  EXAM  There were no vitals filed for this visit.  There is no height or weight on file to calculate BMI.  GENERAL: Pleasant male.  No distress.  Normocephalic and atraumatic.  Baseline neurological exam below was obtained at prior office visit. Changes from today's visit appear in bold.   MUSCULOSKELETAL: Bulk - Normal Tone - Normal Pronator Drift - Absent bilaterally. Ambulation - Gait and station are abnormal - unsteady gait with right foot dragging. Romberg - negative.  R/L 3/5    Shoulder abduction (deltoid/supraspinatus, axillary/suprascapular n, C5) 4/5    Elbow flexion (biceps brachii, musculoskeletal n, C5-6) 4/5    Elbow extension (triceps, radial n, C7) 2/5    Finger adduction (interossei, ulnar n, T1)  5/5    Hip flexion (iliopsoas, L1/L2) 5/5    Knee flexion (hamstrings, sciatic n, L5/S1)  5/5    Knee extension (quadriceps, femoral n, L3/4) 4/5    Ankle dorsiflexion (tibialis anterior, deep fibular n, L4/5) 2/5    Ankle plantarflexion (gastroc, tibial n, S1)   NEUROLOGICAL: MENTAL STATUS: Patient is oriented to person, place and time.  Recent memory is intact.  Remote memory is intact.   Attention span and concentration are intact.  Naming, repetition, comprehension and expressive speech are within normal limits.  Patient's fund of knowledge is within normal limits for educational level.  CRANIAL NERVES: Normal    CN II (normal visual acuity and visual fields) Normal    CN III, IV, VI (extraocular muscles are intact) Normal    CN V (facial sensation is intact bilaterally) Normal    CN VII (facial strength is intact bilaterally) Normal    CN VIII (hearing is intact bilaterally) Normal    CN IX/X (palate elevates midline, normal phonation) Normal    CN XI (shoulder shrug strength is asymmetric) Normal    CN XII (tongue protrudes midline)  SENSATION: Grossly intact bilaterally. Decreased sensation on the right compared to left.  COORDINATION/CEREBELLAR: Finger to nose testing is within normal limits.     PAST MEDICAL HISTORY Past Medical History:  Diagnosis Date   Acute ST elevation myocardial infarction (STEMI) involving left anterior descending (LAD) coronary artery (CMS/HHS-HCC) 08/06/2019   Anxiety 09/2022   After stroke   CAD (coronary artery disease) 10/04/2022   CHF (congestive heart failure) (CMS/HHS-HCC)    Cocaine abuse (  CMS/HHS-HCC) 10/06/2022   CVA (cerebral vascular accident) (CMS/HHS-HCC) 10/04/2022   Depression 09/2022   When stroke happened   NSTEMI (non-ST elevated myocardial infarction) (CMS/HHS-HCC) 05/25/2015   Polysubstance abuse (CMS/HHS-HCC)    a. tobacco, cocaine, crack, ecstasy, pills, anything but injections   Primary hypertension 10/05/2022   Sleep apnea    Tobacco abuse     PAST SURGICAL HISTORY Past Surgical History:  Procedure Laterality Date   Left Heart Cath and Coronary Angiography  05/28/2015   Surgeon: Evalene JINNY Lunger, MD; Location: [facility] INVASIVE CV LAB; Service: Cardiovascular; Laterality: N/A;   CORONARY/GRAFT ACUTE MI REVASCULARIZATION   08/06/2019   Surgeon: Ammon Blunt, MD; Location: [facility]  INVASIVE CV LAB; Service: Cardiovascular; Laterality: N/A;   LEFT HEART CATH AND CORONARY ANGIOGRAPHY  08/06/2019   Surgeon: Ammon Blunt, MD; Location: [facility] INVASIVE CV LAB; Service: Cardiovascular; Laterality: N/A;   CORONARY ANGIOGRAPHY   04/22/2021   Surgeon: Ammon Blunt, MD; Location: [facility] INV[facility]IVE CV LAB; Service: Cardiovascular; Laterality: N/A;   LEFT HEART CATH  04/22/2021   Surgeon: Ammon Blunt, MD; Location: [facility] INVASIVE CV LAB; Service: Cardiovascular; Laterality: N/A;   TEE WITHOUT CARDIOVERSION  10/08/2022   Surgeon: Darliss Rogue, MD; Location: [facility] ORS; Service: Cardiovascular; Laterality: N/A; 11:30am    FAMILY HISTORY Family History  Problem Relation Name Age of Onset   Heart disease Father     High blood pressure (Hypertension) Father     Cancer Maternal Grandmother     High blood pressure (Hypertension) Maternal Grandmother     Alcohol abuse Maternal Grandfather     Depression Maternal Grandfather     Stroke Maternal Grandfather     High blood pressure (Hypertension) Maternal Grandfather     Depression Daughter      SOCIAL HISTORY  Social History   Tobacco Use   Smoking status: Every Day    Types: Cigarettes   Smokeless tobacco: Never  Substance Use Topics   Alcohol use: Yes    Alcohol/week: 1.0 - 2.0 standard drink of alcohol    Types: 1 - 2 Standard drinks or equivalent per week   Drug use: Yes    Comment: marijuana   REVIEW OF SYSTEMS:  13 system ROS was verbally reviewed with patient. Pertinent positives and negatives are mentioned above in the HPI and all other systems are negative.  DATA   No visits with results within 6 Month(s) from this visit.  Latest known visit with results is:  No results found for any previous visit.   No follow-ups on file.  Payor: [employer] COMMUNITY PLAN [place] / Plan: [place] [facility] COMMUNITY PLAN / Product Type: Medicaid /   This note is partially written by Lauraine Hales, in the presence of and acting as the scribe of Lauraine Rocks, PA-C.     I agree that the scribe documentation is complete and accurate.  This note was generated in part with voice recognition software and I apologize for any typographical errors that were not detected and corrected.    Attestation Statement:   I personally performed the service, non-incident to. [facility])   SARAH ELIZABETH MASON, PA       This note has been created using automated tools and reviewed for accuracy by [facility].

## 2024-10-11 ENCOUNTER — Other Ambulatory Visit: Payer: Self-pay | Admitting: Family Medicine

## 2024-10-11 DIAGNOSIS — F3289 Other specified depressive episodes: Secondary | ICD-10-CM

## 2024-10-11 DIAGNOSIS — G4709 Other insomnia: Secondary | ICD-10-CM

## 2024-10-12 ENCOUNTER — Other Ambulatory Visit: Payer: Self-pay

## 2024-10-12 ENCOUNTER — Other Ambulatory Visit: Payer: Self-pay | Admitting: Family Medicine

## 2024-10-12 DIAGNOSIS — G4709 Other insomnia: Secondary | ICD-10-CM

## 2024-10-12 DIAGNOSIS — F3289 Other specified depressive episodes: Secondary | ICD-10-CM

## 2024-10-13 ENCOUNTER — Other Ambulatory Visit: Payer: Self-pay | Admitting: Family Medicine

## 2024-10-13 ENCOUNTER — Ambulatory Visit: Admitting: Pharmacist

## 2024-10-13 VITALS — Wt 278.0 lb

## 2024-10-13 DIAGNOSIS — Z9861 Coronary angioplasty status: Secondary | ICD-10-CM | POA: Insufficient documentation

## 2024-10-13 DIAGNOSIS — I6930 Unspecified sequelae of cerebral infarction: Secondary | ICD-10-CM | POA: Diagnosis present

## 2024-10-13 DIAGNOSIS — F3289 Other specified depressive episodes: Secondary | ICD-10-CM

## 2024-10-13 DIAGNOSIS — I2510 Atherosclerotic heart disease of native coronary artery without angina pectoris: Secondary | ICD-10-CM | POA: Diagnosis present

## 2024-10-13 DIAGNOSIS — I5020 Unspecified systolic (congestive) heart failure: Secondary | ICD-10-CM | POA: Diagnosis present

## 2024-10-13 DIAGNOSIS — G4709 Other insomnia: Secondary | ICD-10-CM

## 2024-10-13 NOTE — Patient Instructions (Signed)

## 2024-10-13 NOTE — Telephone Encounter (Signed)
 Copied from CRM #[id number]. Topic: Clinical - Prescription Issue >> Oct 13, 2024  3:54 PM Wess RAMAN wrote: Reason for CRM: Patient's mother, Barnie, stated the pharmacy is still waiting for patient's  DULoxetine  (CYMBALTA ) 60 MG capsule and traZODone  (DESYREL ) 50 MG tablet. Advised that refills may take up to 3 business days.  Callback #: [phone]  Pharmacy: [facility] [address] [place] [address] Phone: [phone] Fax: [phone] Hours: Mon-Fri 7:30a-7p; Sat 8a-4:30p; [place] 10a-2p

## 2024-10-13 NOTE — Progress Notes (Signed)
 Patient ID: Ricardo Yang                 DOB: January 29, 1976                    MRN: [id number]     HPI: Ricardo Yang is a 48 y.o. male patient referred to pharmacy clinic by Dr. Argentina to initiate GLP1-RA therapy. PMH is significant for CVA, HF, CAD s/p PCI LAD in 2020 and obesity. Most recent BMI 33.8 kg/m .  Patient presents today to discuss GLP-1 therapy.  He just had some work done in his knee which has decreased his pain and has been able to exercise more in the last month.  He tells me that he once was over 400 pounds.  He stopped drinking soda about 2 months ago.  He is very interested in learning about the other benefits to GLP-1 therapy.  We discussed its benefit that he could have both on his heart failure and his cardiovascular and cerebrovascular disease.   He has significantly reduced his tobacco intake.  Does smoke a decent amount of marijuana which he was educated on the risks of this including cardiovascular risk and risk of product being laced.  Baseline weight and BMI:278lb/ 33.8 kg/m    Diet:  Breakfast: sausage/egg burrito, cereal or french toast sticks Lunch:couple of sandwiches w/ chips or pretzels Dinner: pad thai, pizza, hot dogs, burgers, tacos Drink: stopped drinking soda 2 months ago, V8 splash w/ meals, water Snacks: fruit  Exercise: walks 1.5 miles 4 times a week  Family History: Family History  Problem Relation Age of Onset   Miscarriages / Stillbirths Mother    Heart disease Father    Hypertension Father    Depression Daughter    Alcohol abuse Maternal Grandfather    Cancer Maternal Grandfather    Depression Maternal Grandfather    Hypertension Maternal Grandfather    Stroke Maternal Grandfather    Cancer Maternal Grandmother    Hypertension Maternal Grandmother       Social History: + tobacco 1 pack per week (used to smoke 2 packs per day) + marijuana, shot or two of vodka once or twice a week   Labs: Lab Results  Component Value Date    HGBA1C 5.8 (H) 10/07/2023    Wt Readings from Last 1 Encounters:  10/05/24 281 lb 14.4 oz (127.9 kg)    BP Readings from Last 1 Encounters:  10/05/24 (!) 137/96   Pulse Readings from Last 1 Encounters:  10/05/24 82       Component Value Date/Time   CHOL 104 07/12/2024 1213   CHOL 179 10/07/2023 1153   TRIG 86 07/12/2024 1213   HDL 40 (L) 07/12/2024 1213   HDL 47 10/07/2023 1153   CHOLHDL 2.6 07/12/2024 1213   VLDL 17 07/12/2024 1213   LDLCALC 47 07/12/2024 1213   LDLCALC 114 (H) 10/07/2023 1153    Past Medical History:  Diagnosis Date   Acute CVA (cerebrovascular accident) (HCC) 10/04/2022   Acute ST elevation myocardial infarction (STEMI) involving left anterior descending (LAD) coronary artery (HCC) 08/06/2019   Anxiety 09/2022   After stroke   CAD (coronary artery disease) 10/04/2022   Cerebrovascular accident (CVA) (HCC) 10/07/2022   Chest pain    CHF (congestive heart failure) (HCC)    Cocaine abuse (HCC)    Cocaine use 10/06/2022   Depression December 2023   When stroke happened   Elevated troponin    Encounter for  imaging to screen for metal prior to MRI 10/07/2022   Healthcare maintenance 10/07/2023   Heavy smoker    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 question possible MS   NSTEMI (non-ST elevated myocardial infarction) (HCC) 05/25/2015   Obesity    Polysubstance abuse (HCC)    a. tobacco, cocaine, crack, ecstasy, pills, anything but injections   Primary hypertension 10/05/2022   Sleep apnea    Always   STEMI (ST elevation myocardial infarction) (HCC) 08/06/2019    Medications Ordered Prior to Encounter[1]  Allergies[2]   Assessment/Plan:  1. Weight loss - Patient has not met goal of at least 5% of body weight loss with comprehensive lifestyle modifications alone in the past 3-6 months. Pharmacotherapy is appropriate to pursue as augmentation. Will start Wegovy 0.25 mg. Confirmed patient  no  personal or family history of medullary thyroid carcinoma (MTC) or Multiple Endocrine Neoplasia syndrome type 2 (MEN 2).  No history of pancreatitis or gallstones injection technique reviewed at today's visit.  Advised patient on common side effects including nausea, diarrhea, dyspepsia, decreased appetite, and fatigue. Counseled patient on reducing meal size and how to titrate medication to minimize side effects. Counseled patient to call if intolerable side effects or if experiencing dehydration, abdominal pain, or dizziness. Along with pharmacotherapy, the patient will follow dietary modifications and aim for at least 150 minutes of moderate-intensity exercise per week, plus resistance training twice a week (as recommended by the American Heart Association). This resistance training--such as weightlifting, bodyweight exercises, or using resistance bands, adapted to the patients ability--will help prevent muscle loss.  Follow up in 1-2 days regarding coverage of Wegovy 0.25 mg. If therapy is initiated, phone follow-ups will be conducted every 4 weeks for dose titration until the patient reaches the effective therapeutic dose and target weight.  Kaliana Albino D Joleena Weisenburger, Pharm.JONETTA SARAN, CPP [facility] A Division of  [facility] [address] [place], [place] [address]  Phone: [phone]; Fax: [phone]       [1]  Current Outpatient Medications on File Prior to Visit  Medication Sig Dispense Refill   aspirin  81 MG chewable tablet Chew 1 tablet (81 mg total) by mouth daily. 30 tablet 5   atorvastatin  (LIPITOR ) 80 MG tablet Take 1 tablet (80 mg total) by mouth daily at 6 PM. 90 tablet 0   dapagliflozin  propanediol (FARXIGA ) 10 MG TABS tablet Take 1 tablet (10 mg total) by mouth daily. NEEDS FOLLOW UP APPOINTMENT FOR MORE REFILLS 90 tablet 0   DULoxetine  (CYMBALTA ) 60 MG capsule Take 1 capsule (60 mg total) by mouth daily. 90 capsule 0   metoprolol  succinate  (TOPROL -XL) 25 MG 24 hr tablet Take 1 tablet (25 mg total) by mouth daily. Dose change 90 tablet 0   sacubitril -valsartan  (ENTRESTO ) 49-51 MG Take 1 tablet by mouth 2 (two) times daily. 60 tablet 6   spironolactone  (ALDACTONE ) 25 MG tablet Take 1 tablet (25 mg total) by mouth daily. 90 tablet 3   traZODone  (DESYREL ) 50 MG tablet Take 0.5-2 tablets (25-100 mg total) by mouth at bedtime as needed. 60 tablet 0   No current facility-administered medications on file prior to visit.  [2] No Known Allergies

## 2024-10-14 ENCOUNTER — Telehealth: Payer: Self-pay | Admitting: Pharmacy Technician

## 2024-10-14 ENCOUNTER — Other Ambulatory Visit (HOSPITAL_COMMUNITY): Payer: Self-pay

## 2024-10-14 NOTE — Telephone Encounter (Signed)
 Pharmacy Patient Advocate Encounter  Received notification from [facility] MEDICAID that Prior Authorization for wegovy has been DENIED.  See denial reason below. No denial letter attached in CMM. Will attach denial letter to Media tab once received.  Your office note was sent but they still denied saying this:

## 2024-10-14 NOTE — Telephone Encounter (Signed)
" ° °  Pharmacy Patient Advocate Encounter   Received notification from melissa that prior authorization for wegovy is required/requested.   Insurance verification completed.   The patient is insured through [facility] MEDICAID.   Per test claim: PA required; PA submitted to above mentioned insurance via Latent Key/confirmation #/EOC [id number] Status is pending  "

## 2024-10-17 ENCOUNTER — Ambulatory Visit: Attending: Physician Assistant | Admitting: Occupational Therapy

## 2024-10-17 ENCOUNTER — Other Ambulatory Visit: Payer: Self-pay

## 2024-10-17 ENCOUNTER — Encounter: Payer: Self-pay | Admitting: Pharmacist

## 2024-10-17 DIAGNOSIS — R278 Other lack of coordination: Secondary | ICD-10-CM | POA: Diagnosis present

## 2024-10-17 DIAGNOSIS — M6281 Muscle weakness (generalized): Secondary | ICD-10-CM | POA: Diagnosis present

## 2024-10-17 MED ORDER — DULOXETINE HCL 60 MG PO CPEP
60.0000 mg | ORAL_CAPSULE | Freq: Every day | ORAL | 0 refills | Status: AC
  Filled 2024-10-17: qty 90, 90d supply, fill #0

## 2024-10-17 MED ORDER — TRAZODONE HCL 50 MG PO TABS
25.0000 mg | ORAL_TABLET | Freq: Every evening | ORAL | 0 refills | Status: AC | PRN
  Filled 2024-10-17: qty 60, 30d supply, fill #0

## 2024-10-17 NOTE — Therapy (Signed)
 " OUTPATIENT OCCUPATIONAL THERAPY NEURO EVALUATION  Patient Name: Ricardo Yang MRN: [id number] DOB:08/26/1976, 48 y.o., male Today's Date: 10/17/2024   REFERRING PROVIDER:  Lauraine Jonette BRAVO, PA-C  END OF SESSION:  OT End of Session - 10/17/24 1622     Visit Number 1    Number of Visits 24    Date for Recertification  01/09/25    Authorization - Visit Number 24    OT Start Time 1615    OT Stop Time 1715   OT Time Calculation (min) 60 min    Activity Tolerance Patient tolerated treatment well    Behavior During Therapy [facility] for tasks assessed/performed          Past Medical History:  Diagnosis Date   Acute CVA (cerebrovascular accident) (HCC) 10/04/2022   Acute ST elevation myocardial infarction (STEMI) involving left anterior descending (LAD) coronary artery (HCC) 08/06/2019   Anxiety 09/2022   After stroke   CAD (coronary artery disease) 10/04/2022   Cerebrovascular accident (CVA) (HCC) 10/07/2022   Chest pain    CHF (congestive heart failure) (HCC)    Cocaine abuse (HCC)    Cocaine use 10/06/2022   Depression December 2023   When stroke happened   Elevated troponin    Encounter for imaging to screen for metal prior to MRI 10/07/2022   Healthcare maintenance 10/07/2023   Heavy smoker    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 question possible MS   NSTEMI (non-ST elevated myocardial infarction) (HCC) 05/25/2015   Obesity    Polysubstance abuse (HCC)    a. tobacco, cocaine, crack, ecstasy, pills, anything but injections   Primary hypertension 10/05/2022   Sleep apnea    Always   STEMI (ST elevation myocardial infarction) (HCC) 08/06/2019   Past Surgical History:  Procedure Laterality Date   CARDIAC CATHETERIZATION N/A 05/28/2015   Procedure: Left Heart Cath and Coronary Angiography;  Surgeon: Evalene JINNY Lunger, MD;  Location: [facility] INVASIVE CV LAB;  Service: Cardiovascular;  Laterality: N/A;   CORONARY ANGIOGRAPHY N/A  04/22/2021   Procedure: CORONARY ANGIOGRAPHY;  Surgeon: Ammon Blunt, MD;  Location: [facility] INVASIVE CV LAB;  Service: Cardiovascular;  Laterality: N/A;   CORONARY/GRAFT ACUTE MI REVASCULARIZATION N/A 08/06/2019   Procedure: Coronary/Graft Acute MI Revascularization;  Surgeon: Ammon Blunt, MD;  Location: [facility] INVASIVE CV LAB;  Service: Cardiovascular;  Laterality: N/A;   LEFT HEART CATH N/A 04/22/2021   Procedure: Left Heart Cath;  Surgeon: Ammon Blunt, MD;  Location: [facility] INVASIVE CV LAB;  Service: Cardiovascular;  Laterality: N/A;   LEFT HEART CATH AND CORONARY ANGIOGRAPHY N/A 08/06/2019   Procedure: LEFT HEART CATH AND CORONARY ANGIOGRAPHY;  Surgeon: Ammon Blunt, MD;  Location: [facility] INVASIVE CV LAB;  Service: Cardiovascular;  Laterality: N/A;   TEE WITHOUT CARDIOVERSION N/A 10/08/2022   Procedure: TRANSESOPHAGEAL ECHOCARDIOGRAM (TEE);  Surgeon: Darliss Rogue, MD;  Location: [facility] ORS;  Service: Cardiovascular;  Laterality: N/A;  11:30am   Patient Active Problem List   Diagnosis Date Noted   Chronic musculoskeletal pain 07/25/2024   Depression 05/25/2024   Biceps tendinitis, right 11/03/2023   Healthcare maintenance 10/07/2023   Sleep apnea 01/01/2023   Dysfunction of right rotator cuff 01/01/2023   History of CVA with residual deficit 12/04/2022   Mixed hyperlipidemia 12/04/2022   History of MI (myocardial infarction) 12/04/2022   Insomnia 10/17/2022   Slow transit constipation 10/17/2022   Osteoarthritis of right knee 10/17/2022   History of substance abuse (HCC) 10/15/2022  HFrEF (heart failure with reduced ejection fraction) (HCC) 10/07/2022   Expressive aphasia 10/05/2022   Right hemiplegia (HCC) 10/05/2022   CAD S/P percutaneous coronary angioplasty 10/05/2022   Polysubstance abuse (HCC) 10/04/2022   Essential hypertension 04/22/2021   Ischemic chest pain     ONSET DATE: 09/2022  REFERRING DIAG: CVA  THERAPY DIAG:  No diagnosis  found.  Rationale for Evaluation and Treatment: Rehabilitation  SUBJECTIVE:   SUBJECTIVE STATEMENT: Pt. Reports that he was receiving home health services. Pt accompanied by: self  PERTINENT HISTORY:    Pt. is a 48 y.o. male who has a history of an ischemic stroke in December of 2023 with  residual right hemiparesis and dysphasia. PMHx includes: arthritis-for which he has finished radiation treatments on his knees via radiation oncology services, major depressive disorder, shoulder ostearthritis, and heart failure with an EF of 25%.  PRECAUTIONS:  None  WEIGHT BEARING RESTRICTIONS: No  PAIN:  Are you having pain? Arthritic pain in the right shoulder 1/10 during the eval, 4/10 with active abduction at the end range  FALLS: Has patient fallen in last 6 months? No  LIVING ENVIRONMENT: Lives with: Resides alone Lives in: House/apartment-Resides in a [address]: one step  Has following equipment at home: Grab bars  PLOF: Independent  PATIENT GOALS:  Regain some everyday use with his right hand.  OBJECTIVE:  Note: Objective measures were completed at Evaluation unless otherwise noted.  HAND DOMINANCE: Right  ADLs:  Transfers/ambulation related to ADLs: Independent Eating: Uses the left hand,; tries to engage his right hand Grooming: holds the tooth brush with the right hand. UB Dressing: Uses his left hand using one armed dressing techniques LB Dressing: Uses the left hand; Pt. reports that he manages to get it done. Toileting:  Pt. Uses his nondominant left hand Bathing:  Engages the right  hand to wash the left axilla region. Tub Shower transfers: Uses a shower chair Equipment:  grab bars shower, shower seat, cane  IADLs: Shopping: Uses the left hand Light housekeeping: Independent Meal Prep: Does not use dishes-uses paperware, plastic cups, and utensils. Community mobility:  Driving Medication management: Independent-stabilizes the bottle with the right  hand, and uses the left hand to open Financial management: Reports having to plan things out more 2/2 with the right hand, and bilateral knee issues. Handwriting: 50% legible with left nondominant hand Typing: Is unable to type with the right hand-uses left hand Cellphone use: uses left hand- slower/more awkward Leisure/Hobbies: Video games Work History: professional Paediatric Nurse; cook  MOBILITY STATUS: Independent  FUNCTIONAL OUTCOME MEASURES: MAM-20: TBD  UPPER EXTREMITY ROM:    Active ROM Right Eval WFL Left Eval [facility]  Shoulder flexion    Shoulder abduction    Shoulder adduction    Shoulder extension    Shoulder internal rotation    Shoulder external rotation    Elbow flexion    Elbow extension    Wrist flexion    Wrist extension    Wrist ulnar deviation    Wrist radial deviation    Wrist pronation    Wrist supination    (Blank rows = not tested)  Active ROM Right Eval  Flex/ext Left Eval WFL  Thumb MCP (0-60)    Thumb IP (0-80) 55/35   Thumb Radial abd/add (0-55)     Thumb Palmar abd/add (0-45)     Thumb Opposition to Small Finger     Index MCP (0-90) Full/0    Index PIP (0-100) Full/40  Index DIP (0-70) Full/25     Long MCP (0-90)  Full/20    Long PIP (0-100) Full/25     Long DIP (0-70)  Full/15    Ring MCP (0-90) Full/25     Ring PIP (0-100) Full/20     Ring DIP (0-70)  Full/10    Little MCP (0-90)  Full/55    Little PIP (0-100)  Full/10    Little DIP (0-70) Full/0     (Blank rows = not tested)   10/17/2024: Pt. Is able to formulate a full composite fist with the right hand   UPPER EXTREMITY MMT:     MMT Right Eval  Left eval  Shoulder flexion 4- 5  Shoulder abduction 4- 5  Shoulder adduction    Shoulder extension    Shoulder internal rotation    Shoulder external rotation    Middle trapezius    Lower trapezius    Elbow flexion 4- 5  Elbow extension 4- 5  Wrist flexion    Wrist extension 4- 5  Wrist ulnar deviation    Wrist radial  deviation    Wrist pronation    Wrist supination 3- 5  (Blank rows = not tested)  HAND FUNCTION: Grip strength: Right: 20 lbs; Left: 110 lbs, Lateral pinch: Right: 5 lbs, Left: 18 lbs, and 3 point pinch: Right: unable lbs, Left: 16 lbs  COORDINATION: 9 Hole Peg Test:  Eval: Pt. Is unable to grasp/remove one vertical 9-Hole peg  SENSATION: Light touch: WFL Proprioception: WFL  EDEMA: N/A  MUSCLE TONE:  Flexor tone in the right hand  COGNITION: Overall cognitive status: Within functional limits for tasks assessed  VISION: Subjective report:  Requires readers now  PERCEPTION: Intact  PRAXIS:impaired                                                                                                                     TREATMENT DATE: 10/17/2024   OT initial evaluation was completed, and Pt. education was provided as indicated below.   PATIENT EDUCATION: Education details:OT services, POC, goals and ADL/IADL functional Status. independence with ADLs, and IADL tasks. Person educated: Patient Education method: Explanation, Demonstration Education comprehension: verbalized understanding, returned demonstration, and verbal cues required  HOME EXERCISE PROGRAM:  Continue to assess ongoing need for HEPs, and provide/upgrade as indicated.    GOALS: Goals reviewed with patient? Yes  SHORT TERM GOALS: Target date: 11/28/2024    Pt. Will be independent with HEP for RUE strength, and coordination skills. Baseline: Eval: No current HEP Goal status: INITIAL   LONG TERM GOALS: Target date: 01/09/2025   Pt. Will increase RUE strength by 2 muscle grades to assist with ADLs, and IADLs. Baseline: Eval:  RUE: shoulder flexion: 4-/5, abduction: 4-/5, elbow flexion: 4-/5, extension: 4-/5, wrist extension 4-/5, forearm supination: 3-/5 Goal status: INITIAL  2.  Pt. Will improve improve right digit extension to be able to independently assume a position of weightbearing, and  proprioception through a flat hand. Baseline: Eval: Pt.  Is initiating gross digit extension, however requires the assist of the left hand to assume a position of weightbearing. Goal status: INITIAL  3.  Pt. Will actively grasp and release 1 ADL objects efficiently 100% of the time with the right hand. Baseline: Eval: Pt. Is able to grasp  2 container, however is unable to actively release it from his hand Goal status: INITIAL  4.  Pt. Will increase right grip strength by 5# to be able to securely hold items of ADLs Baseline: Eval: Grip strength: Right: 20#, Left: 110  Goal status: INITIAL  5.  Pt. Will increase right lateral pinch strength by 2# to be able to hold, and efficiently use utensils Baseline: Eval:Lateral Pinch: Right: 5#, Left: 18# Pt. has difficulty efficiently holding, and using utensils. Goal status: INITIAL  6.  Pt. Will identify/demonstrate adaptive/modifcations for ADLs/IADLs. Baseline: Eval: Education to be provided Goal status: INITIAL  ASSESSMENT:  CLINICAL IMPRESSION:  Patient is a 48 y.o. male who was seen today for occupational therapy evaluation for CVA with right sided hemiparesis.  Pt. presents with limited dominant RUE strength, positive flexor tone in the right hand, impaired active gross digit extension, decreased grip strength, pinch strength, and FMC skills. These impairments affect his ability to release items from his hand, and to securely grasp, hold, and efficiently use ADL items with his right hand. Pt. will benefit from OT services to work towards improving dominant RUE functioning in order increase engagement in, and maximize independence with ADLs, and IADLs.  PERFORMANCE DEFICITS: in functional skills including ADLs, IADLs, coordination, dexterity, ROM, strength, pain, Fine motor control, Gross motor control, and UE functional use, cognitive skills including, and psychosocial skills including coping strategies, environmental adaptation, and  routines and behaviors.   IMPAIRMENTS: are limiting patient from ADLs, IADLs, and leisure.   CO-MORBIDITIES: may have co-morbidities  that affects occupational performance. Patient will benefit from skilled OT to address above impairments and improve overall function.  MODIFICATION OR ASSISTANCE TO COMPLETE EVALUATION: Min-Moderate modification of tasks or assist with assess necessary to complete an evaluation.  OT OCCUPATIONAL PROFILE AND HISTORY: Detailed assessment: Review of records and additional review of physical, cognitive, psychosocial history related to current functional performance.  CLINICAL DECISION MAKING: Moderate - several treatment options, min-mod task modification necessary  REHAB POTENTIAL: Good  EVALUATION COMPLEXITY: Moderate    PLAN:  OT FREQUENCY: 1-2x/week  OT DURATION: 12 weeks  PLANNED INTERVENTIONS: 97168 OT Re-evaluation, 97535 self care/ADL training, 02889 therapeutic exercise, 97530 therapeutic activity, 97112 neuromuscular re-education, 97140 manual therapy, 97018 paraffin, 02989 moist heat, 97034 contrast bath, 97760 Orthotic Initial, 97763 Orthotic/Prosthetic subsequent, energy conservation, patient/family education, and DME and/or AE instructions  RECOMMENDED OTHER SERVICES: N/A  CONSULTED AND AGREED WITH PLAN OF CARE: Patient  PLAN FOR NEXT SESSION: Treatment   Richardson Otter, MS, OTR/L   10/17/2024, 4:26 PM           "

## 2024-10-17 NOTE — Telephone Encounter (Signed)
 Requested Prescriptions  Pending Prescriptions Disp Refills   DULoxetine  (CYMBALTA ) 60 MG capsule 90 capsule 0    Sig: Take 1 capsule (60 mg total) by mouth daily.     Psychiatry: Antidepressants - SNRI - duloxetine  Failed - 10/17/2024  4:20 PM      Failed - Last BP in normal range    BP Readings from Last 1 Encounters:  10/05/24 (!) 137/96         Passed - Cr in normal range and within 360 days    Creatinine, Ser  Date Value Ref Range Status  08/23/2024 0.95 0.76 - 1.27 mg/dL Final         Passed - eGFR is 30 or above and within 360 days    GFR calc Af Amer  Date Value Ref Range Status  08/08/2019 >60 >60 mL/min Final   GFR, Estimated  Date Value Ref Range Status  07/25/2024 >60 >60 mL/min Final    Comment:    (NOTE) Calculated using the CKD-EPI Creatinine Equation (2021)    eGFR  Date Value Ref Range Status  08/23/2024 98 >59 mL/min/1.73 Final         Passed - Completed PHQ-2 or PHQ-9 in the last 360 days      Passed - Valid encounter within last 6 months    Recent Outpatient Visits           2 months ago Primary osteoarthritis of right knee   [facility], Selinda PARAS, MD   3 months ago Primary osteoarthritis of right knee   [facility], Selinda PARAS, MD   4 months ago Primary osteoarthritis of right knee   [facility], Selinda PARAS, MD   4 months ago History of CVA with residual deficit   [place] [facility], Selinda PARAS, MD               traZODone  (DESYREL ) 50 MG tablet 60 tablet 0    Sig: Take 0.5-2 tablets (25-100 mg total) by mouth at bedtime as needed.     Psychiatry: Antidepressants - Serotonin Modulator Passed - 10/17/2024  4:20 PM      Passed - Completed PHQ-2 or PHQ-9 in the last 360 days      Passed - Valid encounter within last 6  months    Recent Outpatient Visits           2 months ago Primary osteoarthritis of right knee   [place] [facility], Selinda PARAS, MD   3 months ago Primary osteoarthritis of right knee   [facility], Selinda PARAS, MD   4 months ago Primary osteoarthritis of right knee   [facility], Selinda PARAS, MD   4 months ago History of CVA with residual deficit   [facility] at [facility] [place], Selinda PARAS, MD

## 2024-10-19 ENCOUNTER — Ambulatory Visit: Admitting: Pharmacist Clinician (PhC)/ Clinical Pharmacy Specialist

## 2024-10-25 ENCOUNTER — Ambulatory Visit

## 2024-10-25 ENCOUNTER — Telehealth: Payer: Self-pay

## 2024-10-25 NOTE — Telephone Encounter (Signed)
 Called patients mother who is patient's legal guardian. She read off the paperwork regarding denial for wegovy. Told patient's mom it does work better to bring the paperwork in and the paperwork will be placed in Dr's mailbox to review. Told her that Dr Argentina is out for the rest of the week and will return Monday and can review the paperwork then and then once filled, can be faxed over to patient insurance plan.   Mother appreciated call and verbalized understanding.

## 2024-10-25 NOTE — Telephone Encounter (Signed)
 Mother Versa) wants to appeal patient's denial of Georjean which is due by 2/17.  Mother stated Dr. Argentina can call [facility] at [phone] or she can bring in paperwork if needed.

## 2024-10-26 ENCOUNTER — Other Ambulatory Visit: Payer: Self-pay | Admitting: Orthopedic Surgery

## 2024-10-26 DIAGNOSIS — I69359 Hemiplegia and hemiparesis following cerebral infarction affecting unspecified side: Secondary | ICD-10-CM

## 2024-10-26 DIAGNOSIS — M67911 Unspecified disorder of synovium and tendon, right shoulder: Secondary | ICD-10-CM

## 2024-10-29 ENCOUNTER — Ambulatory Visit
Admission: RE | Admit: 2024-10-29 | Discharge: 2024-10-29 | Disposition: A | Source: Ambulatory Visit | Attending: Orthopedic Surgery | Admitting: Orthopedic Surgery

## 2024-10-29 DIAGNOSIS — M67911 Unspecified disorder of synovium and tendon, right shoulder: Secondary | ICD-10-CM

## 2024-10-29 DIAGNOSIS — I69359 Hemiplegia and hemiparesis following cerebral infarction affecting unspecified side: Secondary | ICD-10-CM

## 2024-10-31 ENCOUNTER — Ambulatory Visit: Attending: Physician Assistant

## 2024-10-31 ENCOUNTER — Telehealth: Payer: Self-pay | Admitting: Family

## 2024-10-31 ENCOUNTER — Other Ambulatory Visit: Payer: Self-pay

## 2024-10-31 DIAGNOSIS — I10 Essential (primary) hypertension: Secondary | ICD-10-CM

## 2024-10-31 DIAGNOSIS — M6281 Muscle weakness (generalized): Secondary | ICD-10-CM | POA: Insufficient documentation

## 2024-10-31 DIAGNOSIS — I63512 Cerebral infarction due to unspecified occlusion or stenosis of left middle cerebral artery: Secondary | ICD-10-CM | POA: Insufficient documentation

## 2024-10-31 DIAGNOSIS — R278 Other lack of coordination: Secondary | ICD-10-CM | POA: Insufficient documentation

## 2024-10-31 DIAGNOSIS — I6930 Unspecified sequelae of cerebral infarction: Secondary | ICD-10-CM

## 2024-10-31 NOTE — Progress Notes (Signed)
 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   LHC 06/22: Mid RCA lesion is 50% stenosed. 2nd Mrg lesion is 100% stenosed. Ost LAD to Prox LAD lesion is 20% stenosed.  1.  Non-ST elevation myocardial infarction 2.  Patent stent proximal LAD 3.  Distal occlusion small to moderate caliber OM 2 4.  Mild to moderate reduced left ventricular function with apical dyskinesis  Admitted 10/04/22 due to  acute onset of aphasia and right-sided weakness with leftward gaze. MRI showed acute infarct in the left ACA, left MCA and likely of the right PCA territory with a large infarct involving the left postcentral gyrus. Transferred to rehab. TEE 10/08/22: EF 20-25%, no atrial thrombus, mild MR. Echo 10/05/22: EF <20%, mild LAE/RAE and mild MR  Was in the ED 11/27/22 due to right hand swelling. Ultrasound negative for DVT.   Was in the ER 05/19/24 following a mechanical fall. Unclear of what surrounded the fall. CT negative for acute stroke. Shoulder xray was negative.   Echo 09/13/24: EF 20-25%, G1DD, mildly reduced RV, global hypokinesis  Seen in [facility] 09/28/24 where valsartan  was changed to entresto .   He presents today for a HF follow up visit with a chief complaint of minimal fatigue. Denies any shortness of breath, chest pain, palpitations, abdominal distention, pedal edema, dizziness or swelling. Has been taking entresto  BID and denies any known side effects that he's aware of.   Uses marijuana daily and uses cigarettes 1-2 every other day. Alcohol use 2-3 times a week, between 2-5 shots each time.   ROS: All systems negative except as listed in HPI, PMH and Problem List.  SH:  Social  History   Socioeconomic History   Marital status: Single    Spouse name: Not on file   Number of children: Not on file   Years of education: Not on file   Highest education level: Not on file  Occupational History   Not on file  Tobacco Use   Smoking status: Some Days    Current packs/day: 0.25    Average packs/day: 0.3 packs/day for 15.0 years (3.8 ttl pk-yrs)    Types: Cigarettes   Smokeless tobacco: Never   Tobacco comments:    Started as a teenager  Vaping Use   Vaping status: Never Used  Substance and Sexual Activity   Alcohol use: Not Currently    Alcohol/week: 1.0 standard drink of alcohol    Types: 1 Shots of liquor per week   Drug use: Yes    Frequency: 1.0 times per week    Types: Cocaine, Marijuana    Comment: 01/26/23 current marijuana no cocaine   Sexual activity: Not Currently    Partners: Female    Birth control/protection: Abstinence, Condom  Other Topics Concern   Not on file  Social History Narrative   Not on file   Social Drivers of Health   Tobacco Use: High Risk (10/24/2024)   Received from [facility]   Patient History    Smoking Tobacco Use: Every Day    Smokeless Tobacco Use: Never    Passive Exposure: Not on file  Financial Resource Strain: Low Risk  (10/24/2024)   Received  from [facility]   Overall Financial Resource Strain (CARDIA)    Difficulty of Paying Living Expenses: Not hard at all  Food Insecurity: No Food Insecurity (10/24/2024)   Received from [facility]   Epic    Within the past 12 months, you worried that your food would run out before you got the money to buy more.: Never true    Within the past 12 months, the food you bought just didn't last and you didn't have money to get more.: Never true  Transportation Needs: No Transportation Needs (10/24/2024)   Received from [facility] - Transportation    In the past 12 months, has lack of  transportation kept you from medical appointments or from getting medications?: No    Lack of Transportation (Non-Medical): No  Physical Activity: Not on file  Stress: Not on file  Social Connections: Not on file  Intimate Partner Violence: Not At Risk (01/01/2023)   Humiliation, Afraid, Rape, and Kick questionnaire    Fear of Current or Ex-Partner: No    Emotionally Abused: No    Physically Abused: No    Sexually Abused: No  Depression (PHQ2-9): Low Risk (08/03/2024)   Depression (PHQ2-9)    PHQ-2 Score: 0  Recent Concern: Depression (PHQ2-9) - High Risk (07/08/2024)   Depression (PHQ2-9)    PHQ-2 Score: 11  Alcohol Screen: Not on file  Housing: High Risk (10/24/2024)   Received [facility]   Epic    In the last 12 months, was there a time when you were not able to pay the mortgage or rent on time?: No    In the past 12 months, how many times have you moved where you were living?: 2    At any time in the past 12 months, were you homeless or living in a shelter (including now)?: No  Utilities: Not At Risk (10/24/2024)   Received from [facility]   Epic    In the past 12 months has the electric, gas, oil, or water company threatened to shut off services in your home?: No  Health Literacy: Not on file    FH:  Family History  Problem Relation Age of Onset   Miscarriages / Stillbirths Mother    Heart disease Father    Hypertension Father    Depression Daughter    Alcohol abuse Maternal Grandfather    Cancer Maternal Grandfather    Depression Maternal Grandfather    Hypertension Maternal Grandfather    Stroke Maternal Grandfather    Cancer Maternal Grandmother    Hypertension Maternal Grandmother     Past Medical History:  Diagnosis Date   Acute CVA (cerebrovascular accident) (HCC) 10/04/2022   Acute ST elevation myocardial infarction (STEMI) involving left anterior descending (LAD) coronary artery (HCC) 08/06/2019   Anxiety 09/2022    After stroke   CAD (coronary artery disease) 10/04/2022   Cerebrovascular accident (CVA) (HCC) 10/07/2022   Chest pain    CHF (congestive heart failure) (HCC)    Cocaine abuse (HCC)    Cocaine use 10/06/2022   Depression December 2023   When stroke happened   Elevated troponin    Encounter for imaging to screen for metal prior to MRI 10/07/2022   Healthcare maintenance 10/07/2023   Heavy smoker    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 question possible MS   NSTEMI (non-ST elevated myocardial  infarction) (HCC) 05/25/2015   Obesity    Polysubstance abuse (HCC)    a. tobacco, cocaine, crack, ecstasy, pills, anything but injections   Primary hypertension 10/05/2022   Sleep apnea    Always   STEMI (ST elevation myocardial infarction) (HCC) 08/06/2019    Current Outpatient Medications  Medication Sig Dispense Refill   aspirin  81 MG chewable tablet Chew 1 tablet (81 mg total) by mouth daily. 30 tablet 5   atorvastatin  (LIPITOR ) 80 MG tablet Take 1 tablet (80 mg total) by mouth daily at 6 PM. 90 tablet 0   dapagliflozin  propanediol (FARXIGA ) 10 MG TABS tablet Take 1 tablet (10 mg total) by mouth daily. NEEDS FOLLOW UP APPOINTMENT FOR MORE REFILLS 90 tablet 0   DULoxetine  (CYMBALTA ) 60 MG capsule Take 1 capsule (60 mg total) by mouth daily. 90 capsule 0   metoprolol  succinate (TOPROL -XL) 25 MG 24 hr tablet Take 1 tablet (25 mg total) by mouth daily. Dose change 90 tablet 0   sacubitril -valsartan  (ENTRESTO ) 49-51 MG Take 1 tablet by mouth 2 (two) times daily. 60 tablet 6   spironolactone  (ALDACTONE ) 25 MG tablet Take 1 tablet (25 mg total) by mouth daily. 90 tablet 3   traZODone  (DESYREL ) 50 MG tablet Take 0.5-2 tablets (25-100 mg total) by mouth at bedtime as needed. 60 tablet 0   No current facility-administered medications for this visit.   Vitals:   11/01/24 1501  BP: 95/77  Pulse: 90  SpO2: 98%  Weight: 285 lb 6.4 oz (129.5 kg)    Wt Readings from Last 3 Encounters:  11/01/24 285 lb 6.4 oz (129.5 kg)  10/13/24 278 lb (126.1 kg)  10/05/24 281 lb 14.4 oz (127.9 kg)   Lab Results  Component Value Date   CREATININE 0.95 08/23/2024   CREATININE 0.89 07/25/2024   CREATININE 0.84 07/12/2024    PHYSICAL EXAM:  General: Well appearing.  Cor: No JVD. Regular rhythm, rate.  Lungs: clear Abdomen: soft, nontender, nondistended. Extremities: no edema Neuro:. Affect pleasant. Right arm / leg weakness  ECG: not done   ASSESSMENT & PLAN:  1: Ischemic heart failure with reduced ejection fraction- - NYHA class II - euvolemic - weight down 3 pounds from last visit here 6 weeks ago  - maintain dietary modifications, limit salt intake 2000mg  to daily and limit intake of juice/soda  - Echo 10/05/22: EF <20%, mild LAE/RAE and mild MR - TEE 10/08/22: EF 20-25%, no atrial thrombus, mild MR.  - Echo 09/13/24: EF 20-25%, G1DD, mildly reduced RV, global hypokinesis.  - EP referral was placed at last visit to discuss primary prevention ICD but he hasn't heard anything yet. He will check with them when he goes to get labs today.  - continue farxiga  10mg  daily - continue metoprolol  succinate 25mg  daily - continue spironolactone  25mg  daily. - decrease entresto  to 24/26mg  BID due to low BP.   2: HTN- - BP 95/77. Decreasing entresto  per above.  - saw PCP Dur[place]) 09/25 - BMP 08/23/24 reviewed: sodium 140, potassium 4.2, creatinine 0.95 & GFR 98 - BMET today  3: CAD- - saw cardiology Marlou) 11/25 - continue ASA 81mg  daily LHC 06/22: Mid RCA lesion is 50% stenosed. 2nd Mrg lesion is 100% stenosed. Ost LAD to Prox LAD lesion is 20% stenosed.  Non-ST elevation myocardial infarction Patent stent proximal LAD  Distal occlusion small to moderate caliber OM 2  Mild to moderate reduced left ventricular function with apical dyskinesis  4: Stroke- - unable to grasp things small  with his right hand (dominant hand is his  right) - saw neurology Ardath) 08/25  5: Substance use- - using alcohol but less than before - continues to use marijuana daily  - continues smoking 1-2 cigarettes every other day - encouraged smoking cessation - denies any cocaine use since the week before his stroke 12/23  6 Hyperlipidemia- - continue atorvastatin  80mg  daily - LDL 07/12/24 was 47   Return in 1 month, sooner if needed.   I spent 31 minutes reviewing records, interviewing/ examing patient and managing plan/ orders.   Ellouise DELENA Class FNP-C 10/31/2024  "

## 2024-10-31 NOTE — Therapy (Addendum)
 " OUTPATIENT OCCUPATIONAL THERAPY NEURO EVALUATION  Patient Name: Ricardo Yang MRN: [id number] DOB:[birth year], 49 y.o., male Today's Date: 11/01/2024   REFERRING PROVIDER:  Lauraine Jonette BRAVO, PA-C  END OF SESSION:  OT End of Session - 11/01/24 1027     Visit Number 2    Number of Visits 24    Date for Recertification  01/09/25    Authorization Type [employer] [id number] for 24 OT vsts from 12/22-3/16/26 (Eval is included in this visit count d/t tx billed at eval)    Authorization Time Period Reporting period beginning 10/17/24    Authorization - Visit Number 2    Authorization - Number of Visits 24    Progress Note Due on Visit 10    OT Start Time 1100    OT Stop Time 1145    OT Time Calculation (min) 45 min    Equipment Utilized During Treatment none    Activity Tolerance Patient tolerated treatment well    Behavior During Therapy [facility] for tasks assessed/performed         Past Medical History:  Diagnosis Date   Acute CVA (cerebrovascular accident) (HCC) 10/04/2022   Acute ST elevation myocardial infarction (STEMI) involving left anterior descending (LAD) coronary artery (HCC) 08/06/2019   Anxiety 09/2022   After stroke   CAD (coronary artery disease) 10/04/2022   Cerebrovascular accident (CVA) (HCC) 10/07/2022   Chest pain    CHF (congestive heart failure) (HCC)    Cocaine abuse (HCC)    Cocaine use 10/06/2022   Depression December 2023   When stroke happened   Elevated troponin    Encounter for imaging to screen for metal prior to MRI 10/07/2022   Healthcare maintenance 10/07/2023   Heavy smoker    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 question possible MS   NSTEMI (non-ST elevated myocardial infarction) (HCC) 05/25/2015   Obesity    Polysubstance abuse (HCC)    a. tobacco, cocaine, crack, ecstasy, pills, anything but injections   Primary hypertension 10/05/2022   Sleep apnea    Always   STEMI (ST elevation myocardial  infarction) (HCC) 08/06/2019   Past Surgical History:  Procedure Laterality Date   CARDIAC CATHETERIZATION N/A 05/28/2015   Procedure: Left Heart Cath and Coronary Angiography;  Surgeon: Evalene JINNY Lunger, MD;  Location: [facility] INVASIVE CV LAB;  Service: Cardiovascular;  Laterality: N/A;   CORONARY ANGIOGRAPHY N/A 04/22/2021   Procedure: CORONARY ANGIOGRAPHY;  Surgeon: Ammon Blunt, MD;  Location: [facility] INVASIVE CV LAB;  Service: Cardiovascular;  Laterality: N/A;   CORONARY/GRAFT ACUTE MI REVASCULARIZATION N/A 08/06/2019   Procedure: Coronary/Graft Acute MI Revascularization;  Surgeon: Ammon Blunt, MD;  Location: [facility] INVASIVE CV LAB;  Service: Cardiovascular;  Laterality: N/A;   LEFT HEART CATH N/A 04/22/2021   Procedure: Left Heart Cath;  Surgeon: Ammon Blunt, MD;  Location: [facility] INVASIVE CV LAB;  Service: Cardiovascular;  Laterality: N/A;   LEFT HEART CATH AND CORONARY ANGIOGRAPHY N/A 08/06/2019   Procedure: LEFT HEART CATH AND CORONARY ANGIOGRAPHY;  Surgeon: Ammon Blunt, MD;  Location: [facility] INVASIVE CV LAB;  Service: Cardiovascular;  Laterality: N/A;   TEE WITHOUT CARDIOVERSION N/A 10/08/2022   Procedure: TRANSESOPHAGEAL ECHOCARDIOGRAM (TEE);  Surgeon: Darliss Rogue, MD;  Location: [facility] ORS;  Service: Cardiovascular;  Laterality: N/A;  11:30am   Patient Active Problem List   Diagnosis Date Noted   Chronic musculoskeletal pain 07/25/2024   Depression 05/25/2024   Biceps tendinitis, right 11/03/2023   Healthcare maintenance  10/07/2023   Sleep apnea 01/01/2023   Dysfunction of right rotator cuff 01/01/2023   History of CVA with residual deficit 12/04/2022   Mixed hyperlipidemia 12/04/2022   History of MI (myocardial infarction) 12/04/2022   Insomnia 10/17/2022   Slow transit constipation 10/17/2022   Osteoarthritis of right knee 10/17/2022   History of substance abuse (HCC) 10/15/2022   HFrEF (heart failure with reduced ejection fraction) (HCC)  10/07/2022   Expressive aphasia 10/05/2022   Right hemiplegia (HCC) 10/05/2022   CAD S/P percutaneous coronary angioplasty 10/05/2022   Polysubstance abuse (HCC) 10/04/2022   Essential hypertension 04/22/2021   Ischemic chest pain    ONSET DATE: 09/2022  REFERRING DIAG: CVA  THERAPY DIAG:  Muscle weakness (generalized)  Other lack of coordination  Left middle cerebral artery stroke (HCC)  Rationale for Evaluation and Treatment: Rehabilitation  SUBJECTIVE:   SUBJECTIVE STATEMENT: Pt reports that he had an MRI last week for his R shoulder, and he will follow up with the orthopedist when the imaging has bee read.  Pt accompanied by: self  PERTINENT HISTORY:    Pt. is a 49 y.o. male who has a history of an ischemic stroke in December of 2023 with  residual right hemiparesis and dysphasia. PMHx includes: arthritis-for which he has finished radiation treatments on his knees via radiation oncology services, major depressive disorder, shoulder ostearthritis, and heart failure with an EF of 25%.  PRECAUTIONS:  None  WEIGHT BEARING RESTRICTIONS: No  PAIN: 10/31/24: 1-2/10 R shoulder; improves with moist heat Are you having pain? Arthritic pain in the right shoulder 1-2/10 during the eval  FALLS: Has patient fallen in last 6 months? No  LIVING ENVIRONMENT: Lives with: Resides alone Lives in: House/apartment-Resides in a [address]: one step  Has following equipment at home: Grab bars  PLOF: Independent  PATIENT GOALS:  Regain some everyday use with his right hand.  OBJECTIVE:  Note: Objective measures were completed at Evaluation unless otherwise noted.  HAND DOMINANCE: Right  ADLs:  Transfers/ambulation related to ADLs: Independent Eating: Uses the left hand,; tries to engage his right hand Grooming: holds the tooth brush with the right hand. UB Dressing: Uses his left hand using one armed dressing techniques LB Dressing: Uses the left hand; Pt. reports that he  manages to get it done. Toileting:  Pt. Uses his nondominant left hand Bathing:  Engages the right  hand to wash the left axilla region. Tub Shower transfers: Uses a shower chair Equipment:  grab bars shower, shower seat, cane  IADLs: Shopping: Uses the left hand Light housekeeping: Independent Meal Prep: Does not use dishes-uses paperware, plastic cups, and utensils. Community mobility:  Driving Medication management: Independent-stabilizes the bottle with the right hand, and uses the left hand to open Financial management: Reports having to plan things out more 2/2 with the right hand, and bilateral knee issues. Handwriting: 50% legible with left nondominant hand Typing: Is unable to type with the right hand-uses left hand Cellphone use: uses left hand- slower/more awkward Leisure/Hobbies: Video games Work History: professional Paediatric Nurse; cook  MOBILITY STATUS: Independent  FUNCTIONAL OUTCOME MEASURES: MAM-20: Eval: TBD   10/31/24: MAM-20 for musculoskeletal conditions: 10/26/24 (eval): 35/80 1= cannot do, 2=Very hard to do, 3=A little hard to do, 4=Easy to do Rating Choose only one number (definitions above)  1 Cut nails with a nail clipper  1 2. Tie shoes with laces  1 3. Cut meat on a plate   2 4. Wring a towel  3  5. Open a medicine bottle with a child proof cap/top   1 6. Zip a jacket  1 7. Button clothes (medium sized buttons)  1 8. Write 3-4 lines legibly  2 9. Take things/cards out of a wallet   3 10. Open a wide-mouth jar or bottle previously opened   2 11. Handle/count monty (bills and coins)  3 12. Pick up a 1/2 full water pitcher  1 13. Turn key (to open a door)  1 14. Squeeze toothpaste onto a toothbrush  3 15. Use spoon or fork  2 16. Brush or comb hair  2 17. Dial or key in telephone numbers  1 18. Brush teeth   3 19. Wash hands  1 20. Use hand(s) to eat a sandwich   35/80       UPPER EXTREMITY ROM:    Active ROM Right Eval WFL Left Eval [facility]   Shoulder flexion    Shoulder abduction    Shoulder adduction    Shoulder extension    Shoulder internal rotation    Shoulder external rotation    Elbow flexion    Elbow extension    Wrist flexion    Wrist extension    Wrist ulnar deviation    Wrist radial deviation    Wrist pronation    Wrist supination    (Blank rows = not tested)  Active ROM Right Eval  Flex/ext Left Eval WFL  Thumb MCP (0-60)    Thumb IP (0-80) 55/35   Thumb Radial abd/add (0-55)     Thumb Palmar abd/add (0-45)     Thumb Opposition to Small Finger     Index MCP (0-90) Full/0    Index PIP (0-100) Full/40    Index DIP (0-70) Full/25     Long MCP (0-90)  Full/20    Long PIP (0-100) Full/25     Long DIP (0-70)  Full/15    Ring MCP (0-90) Full/25     Ring PIP (0-100) Full/20     Ring DIP (0-70)  Full/10    Little MCP (0-90)  Full/55    Little PIP (0-100)  Full/10    Little DIP (0-70) Full/0     (Blank rows = not tested)   10/17/2024: Pt. Is able to formulate a full composite fist with the right hand  UPPER EXTREMITY MMT:     MMT Right Eval  Left eval  Shoulder flexion 4- 5  Shoulder abduction 4- 5  Shoulder adduction    Shoulder extension    Shoulder internal rotation    Shoulder external rotation    Middle trapezius    Lower trapezius    Elbow flexion 4- 5  Elbow extension 4- 5  Wrist flexion    Wrist extension 4- 5  Wrist ulnar deviation    Wrist radial deviation    Wrist pronation    Wrist supination 3- 5  (Blank rows = not tested)  HAND FUNCTION: Grip strength: Right: 20 lbs; Left: 110 lbs, Lateral pinch: Right: 5 lbs, Left: 18 lbs, and 3 point pinch: Right: unable lbs, Left: 16 lbs  COORDINATION: 9 Hole Peg Test:  Eval: Pt. Is unable to grasp/remove one vertical 9-Hole peg  SENSATION: Light touch: WFL Proprioception: WFL  EDEMA: N/A  MUSCLE TONE:  Flexor tone in the right hand  COGNITION: Overall cognitive status: Within functional limits for tasks  assessed  VISION: Subjective report:  Requires readers now  PERCEPTION: Intact  PRAXIS:impaired  TREATMENT DATE: 10/31/24  Self Care: -Completed MAM-20 for neurological conditions.  See above for scoring.  -Issued trial L and XL Metaflex compression glove for R hand, advised on benefits, recommended wearing times.    Therapeutic Activity: -Use of Metaflex compression glove to R hand to provide proprioceptive input and light active assisted digit ext while targeting R hand pinch strengthening and grasp/release task practice to move 1 velcro blocks from velcro board>table top.  Practiced this activity with and without glove to assess for effectiveness of glove to ease release of blocks from hand.  Constant monitoring for adjustment of extension bands to ensure secure grasp and adequate release with correct band tension, specifically at the R thumb, IF, and SF.  PATIENT EDUCATION: Education details: Use and recommended wearing time for Metaflex compression glove  Person educated: Patient Education method: Explanation, Demonstration Education comprehension: verbalized understanding, returned demonstration, and verbal cues required  HOME EXERCISE PROGRAM: R handed grasp/release task practice using Metaflex compression glove  GOALS: Goals reviewed with patient? Yes  SHORT TERM GOALS: Target date: 11/28/2024    Pt. Will be independent with HEP for RUE strength, and coordination skills. Baseline: Eval: No current HEP Goal status: INITIAL   LONG TERM GOALS: Target date: 01/09/2025   Pt. Will increase RUE strength by 2 muscle grades to assist with ADLs, and IADLs. Baseline: Eval:  RUE: shoulder flexion: 4-/5, abduction: 4-/5, elbow flexion: 4-/5, extension: 4-/5, wrist extension 4-/5, forearm supination: 3-/5 Goal status: INITIAL  2.  Pt. Will improve improve right digit  extension to be able to independently assume a position of weightbearing, and proprioception through a flat hand. Baseline: Eval: Pt. Is initiating gross digit extension, however requires the assist of the left hand to assume a position of weightbearing. Goal status: INITIAL  3.  Pt. Will actively grasp and release 1 ADL objects efficiently 100% of the time with the right hand. Baseline: Eval: Pt. Is able to grasp  2 container, however is unable to actively release it from his hand Goal status: INITIAL  4.  Pt. Will increase right grip strength by 5# to be able to securely hold items of ADLs Baseline: Eval: Grip strength: Right: 20#, Left: 110  Goal status: INITIAL  5.  Pt. Will increase right lateral pinch strength by 2# to be able to hold, and efficiently use utensils Baseline: Eval:Lateral Pinch: Right: 5#, Left: 18# Pt. has difficulty efficiently holding, and using utensils. Goal status: INITIAL  6.  Pt. Will identify/demonstrate adaptive/modifcations for ADLs/IADLs. Baseline: Eval: Education to be provided Goal status: INITIAL  ASSESSMENT:  CLINICAL IMPRESSION: Pt demonstrated good benefit from Metaflex compression glove for more efficient release of light items from R hand; pt also acknowledged effectiveness of glove, noting easier ability to extend R thumb, IF, and SF after grasping blocks.  R IF velcro tab pulled to R thumb velcro d/t R IF tends to deviate radially.  OT recommended pt doff glove at night time, and don during the day for high reps of grasp/release task practice using R hand.  Advised not to wear all day, to ensure pt can expose skin to open air and to allow pt to wash and lotion R hand d/t skin currently dry and peeling on this hand.  Pt verbalized understanding.  MAM-20 for neurological conditions completed today, noting 35/80, and pt verbalizing use of R hand mostly as a fair stabilizer to the R.  Pt continues to present with limited dominant RUE strength, positive  flexor tone in  the right hand, impaired active gross digit extension, decreased grip strength, pinch strength, and FMC skills. These impairments affect his ability to release items from his hand, and to securely grasp, hold, and efficiently use ADL items with his right hand. Pt will continue to benefit from OT services to work towards improving dominant RUE functioning in order increase engagement in, and maximize independence with ADLs, and IADLs.  PERFORMANCE DEFICITS: in functional skills including ADLs, IADLs, coordination, dexterity, ROM, strength, pain, Fine motor control, Gross motor control, and UE functional use, cognitive skills including, and psychosocial skills including coping strategies, environmental adaptation, and routines and behaviors.   IMPAIRMENTS: are limiting patient from ADLs, IADLs, and leisure.   CO-MORBIDITIES: may have co-morbidities  that affects occupational performance. Patient will benefit from skilled OT to address above impairments and improve overall function.  MODIFICATION OR ASSISTANCE TO COMPLETE EVALUATION: Min-Moderate modification of tasks or assist with assess necessary to complete an evaluation.  OT OCCUPATIONAL PROFILE AND HISTORY: Detailed assessment: Review of records and additional review of physical, cognitive, psychosocial history related to current functional performance.  CLINICAL DECISION MAKING: Moderate - several treatment options, min-mod task modification necessary  REHAB POTENTIAL: Good  EVALUATION COMPLEXITY: Moderate    PLAN:  OT FREQUENCY: 1-2x/week  OT DURATION: 12 weeks  PLANNED INTERVENTIONS: 97168 OT Re-evaluation, 97535 self care/ADL training, 02889 therapeutic exercise, 97530 therapeutic activity, 97112 neuromuscular re-education, 97140 manual therapy, 97018 paraffin, 02989 moist heat, 97034 contrast bath, 97760 Orthotic Initial, 97763 Orthotic/Prosthetic subsequent, energy conservation, patient/family education, and DME  and/or AE instructions  RECOMMENDED OTHER SERVICES: N/A  CONSULTED AND AGREED WITH PLAN OF CARE: Patient  PLAN FOR NEXT SESSION: Treatment   Inocente Blazing, MS, OTR/L   11/01/2024, 10:33 AM           "

## 2024-10-31 NOTE — Telephone Encounter (Signed)
 Called to confirm/remind patient of their appointment at the [facility] on 11/01/24.   Appointment:   [x] Confirmed  [] Left mess   [] No answer/No voice mail  [] VM Full/unable to leave message  [] Phone not in service  Patient reminded to bring all medications and/or complete list.  Confirmed patient has transportation. Gave directions, instructed to utilize valet parking.

## 2024-11-01 ENCOUNTER — Encounter: Payer: Self-pay | Admitting: Family

## 2024-11-01 ENCOUNTER — Ambulatory Visit: Attending: Family | Admitting: Family

## 2024-11-01 ENCOUNTER — Other Ambulatory Visit: Payer: Self-pay

## 2024-11-01 ENCOUNTER — Ambulatory Visit: Admitting: Occupational Therapy

## 2024-11-01 ENCOUNTER — Other Ambulatory Visit (HOSPITAL_COMMUNITY): Payer: Self-pay

## 2024-11-01 VITALS — BP 95/77 | HR 90 | Wt 285.4 lb

## 2024-11-01 DIAGNOSIS — Z8673 Personal history of transient ischemic attack (TIA), and cerebral infarction without residual deficits: Secondary | ICD-10-CM | POA: Insufficient documentation

## 2024-11-01 DIAGNOSIS — I6930 Unspecified sequelae of cerebral infarction: Secondary | ICD-10-CM

## 2024-11-01 DIAGNOSIS — Z79899 Other long term (current) drug therapy: Secondary | ICD-10-CM | POA: Diagnosis not present

## 2024-11-01 DIAGNOSIS — I2510 Atherosclerotic heart disease of native coronary artery without angina pectoris: Secondary | ICD-10-CM | POA: Insufficient documentation

## 2024-11-01 DIAGNOSIS — I5020 Unspecified systolic (congestive) heart failure: Secondary | ICD-10-CM | POA: Diagnosis not present

## 2024-11-01 DIAGNOSIS — Z9861 Coronary angioplasty status: Secondary | ICD-10-CM | POA: Diagnosis not present

## 2024-11-01 DIAGNOSIS — F419 Anxiety disorder, unspecified: Secondary | ICD-10-CM | POA: Diagnosis not present

## 2024-11-01 DIAGNOSIS — Z716 Tobacco abuse counseling: Secondary | ICD-10-CM | POA: Diagnosis not present

## 2024-11-01 DIAGNOSIS — F1290 Cannabis use, unspecified, uncomplicated: Secondary | ICD-10-CM | POA: Diagnosis not present

## 2024-11-01 DIAGNOSIS — F1910 Other psychoactive substance abuse, uncomplicated: Secondary | ICD-10-CM | POA: Diagnosis not present

## 2024-11-01 DIAGNOSIS — E785 Hyperlipidemia, unspecified: Secondary | ICD-10-CM | POA: Diagnosis not present

## 2024-11-01 DIAGNOSIS — I252 Old myocardial infarction: Secondary | ICD-10-CM | POA: Insufficient documentation

## 2024-11-01 DIAGNOSIS — G4730 Sleep apnea, unspecified: Secondary | ICD-10-CM | POA: Insufficient documentation

## 2024-11-01 DIAGNOSIS — E782 Mixed hyperlipidemia: Secondary | ICD-10-CM | POA: Diagnosis not present

## 2024-11-01 DIAGNOSIS — G35D Multiple sclerosis, unspecified: Secondary | ICD-10-CM | POA: Diagnosis not present

## 2024-11-01 DIAGNOSIS — F32A Depression, unspecified: Secondary | ICD-10-CM | POA: Diagnosis not present

## 2024-11-01 DIAGNOSIS — F17210 Nicotine dependence, cigarettes, uncomplicated: Secondary | ICD-10-CM | POA: Diagnosis not present

## 2024-11-01 DIAGNOSIS — Z7984 Long term (current) use of oral hypoglycemic drugs: Secondary | ICD-10-CM | POA: Diagnosis not present

## 2024-11-01 DIAGNOSIS — Z7982 Long term (current) use of aspirin: Secondary | ICD-10-CM | POA: Insufficient documentation

## 2024-11-01 DIAGNOSIS — I10 Essential (primary) hypertension: Secondary | ICD-10-CM

## 2024-11-01 DIAGNOSIS — Z955 Presence of coronary angioplasty implant and graft: Secondary | ICD-10-CM | POA: Diagnosis not present

## 2024-11-01 DIAGNOSIS — I110 Hypertensive heart disease with heart failure: Secondary | ICD-10-CM | POA: Diagnosis not present

## 2024-11-01 LAB — BASIC METABOLIC PANEL WITH GFR
BUN/Creatinine Ratio: 12 (ref 9–20)
BUN: 12 mg/dL (ref 6–24)
CO2: 25 mmol/L (ref 20–29)
Calcium: 9.4 mg/dL (ref 8.7–10.2)
Chloride: 103 mmol/L (ref 96–106)
Creatinine, Ser: 1.01 mg/dL (ref 0.76–1.27)
Glucose: 79 mg/dL (ref 70–99)
Potassium: 4.4 mmol/L (ref 3.5–5.2)
Sodium: 143 mmol/L (ref 134–144)
eGFR: 91 mL/min/1.73

## 2024-11-01 MED ORDER — DAPAGLIFLOZIN PROPANEDIOL 10 MG PO TABS
10.0000 mg | ORAL_TABLET | Freq: Every day | ORAL | 1 refills | Status: AC
  Filled 2024-11-01: qty 90, 90d supply, fill #0

## 2024-11-01 MED ORDER — METOPROLOL SUCCINATE ER 25 MG PO TB24
25.0000 mg | ORAL_TABLET | Freq: Every day | ORAL | 0 refills | Status: AC
  Filled 2024-11-01: qty 90, 90d supply, fill #0

## 2024-11-01 MED ORDER — SACUBITRIL-VALSARTAN 24-26 MG PO TABS
1.0000 | ORAL_TABLET | Freq: Two times a day (BID) | ORAL | 1 refills | Status: AC
  Filled 2024-11-01: qty 180, 90d supply, fill #0

## 2024-11-01 MED ORDER — ATORVASTATIN CALCIUM 80 MG PO TABS
80.0000 mg | ORAL_TABLET | Freq: Every day | ORAL | 0 refills | Status: AC
  Filled 2024-11-01: qty 90, 90d supply, fill #0

## 2024-11-01 NOTE — Patient Instructions (Signed)
 Medication Changes:  DECREASE Entresto  24-26mg  (1 tab) two times daily  Lab Work:  Go downstairs to [facility] on [address] to have your blood work completed.  We will only call you if the results are abnormal or if the provider would like to make medication changes.  No news is good news.  Referrals:  Please ask [facility] downstairs to schedule your follow up with them.   Follow-Up in: Please follow up with the [facility] 1 month with Ellouise Class, FNP.   Thank you for choosing [facility].    At the [facility], you and your health needs are our priority. We have a designated team specialized in the treatment of Heart Failure. This Care Team includes your primary Heart Failure Specialized Cardiologist (physician), Advanced Practice Providers (APPs- Physician Assistants and Nurse Practitioners), and Pharmacist who all work together to provide you with the care you need, when you need it.   You may see any of the following providers on your designated Care Team at your next follow up:  Dr. Toribio Fuel Dr. Ezra Shuck Dr. Ria Commander Dr. Morene Brownie Ellouise Class, FNP Jaun Bash, RPH-CPP  Please be sure to bring in all your medications bottles to every appointment.   Need to Contact Us :  If you have any questions or concerns before your next appointment please send us  a message through [place] or call our office at [phone].    TO LEAVE A MESSAGE FOR THE NURSE SELECT OPTION 2, PLEASE LEAVE A MESSAGE INCLUDING: YOUR NAME DATE OF BIRTH CALL BACK NUMBER REASON FOR CALL**this is important as we prioritize the call backs  YOU WILL RECEIVE A CALL BACK THE SAME DAY AS LONG AS YOU CALL BEFORE 4:00 PM

## 2024-11-01 NOTE — Telephone Encounter (Signed)
 Requested Prescriptions  Pending Prescriptions Disp Refills   dapagliflozin  propanediol (FARXIGA ) 10 MG TABS tablet 90 tablet 0    Sig: Take 1 tablet (10 mg total) by mouth daily. NEEDS FOLLOW UP APPOINTMENT FOR MORE REFILLS     Endocrinology:  Diabetes - SGLT2 Inhibitors Failed - 11/01/2024  2:47 PM      Failed - HBA1C is between 0 and 7.9 and within 180 days    Hgb A1c MFr Bld  Date Value Ref Range Status  10/07/2023 5.8 (H) 4.8 - 5.6 % Final    Comment:             Prediabetes: 5.7 - 6.4          Diabetes: >6.4          Glycemic control for adults with diabetes: <7.0          Passed - Cr in normal range and within 360 days    Creatinine, Ser  Date Value Ref Range Status  08/23/2024 0.95 0.76 - 1.27 mg/dL Final         Passed - eGFR in normal range and within 360 days    GFR calc Af Amer  Date Value Ref Range Status  08/08/2019 >60 >60 mL/min Final   GFR, Estimated  Date Value Ref Range Status  07/25/2024 >60 >60 mL/min Final    Comment:    (NOTE) Calculated using the CKD-EPI Creatinine Equation (2021)    eGFR  Date Value Ref Range Status  08/23/2024 98 >59 mL/min/1.73 Final         Passed - Valid encounter within last 6 months    Recent Outpatient Visits           3 months ago Primary osteoarthritis of right knee   [place] [facility], Selinda PARAS, MD   3 months ago Primary osteoarthritis of right knee   [place] [facility] at [facility], Selinda PARAS, MD   5 months ago Primary osteoarthritis of right knee   [place] [facility] at [facility], Selinda PARAS, MD   5 months ago History of CVA with residual deficit   [facility] at [facility], Selinda PARAS, MD               atorvastatin  (LIPITOR ) 80 MG tablet 90 tablet 0    Sig: Take 1 tablet (80 mg total) by mouth daily at 6 PM.     Cardiovascular:   Antilipid - Statins Failed - 11/01/2024  2:47 PM      Failed - Lipid Panel in normal range within the last 12 months    Cholesterol, Total  Date Value Ref Range Status  10/07/2023 179 100 - 199 mg/dL Final   Cholesterol  Date Value Ref Range Status  07/12/2024 104 0 - 200 mg/dL Final   LDL Chol Calc (NIH)  Date Value Ref Range Status  10/07/2023 114 (H) 0 - 99 mg/dL Final   LDL Cholesterol  Date Value Ref Range Status  07/12/2024 47 0 - 99 mg/dL Final    Comment:           Total Cholesterol/HDL:CHD Risk Coronary Heart Disease Risk Table                     Men   Women  1/2 Average Risk   3.4   3.3  Average Risk       5.0   4.4  2 X Average Risk   9.6   7.1  3 X Average Risk  23.4   11.0        Use the calculated Patient Ratio above and the CHD Risk Table to determine the patient's CHD Risk.        ATP III CLASSIFICATION (LDL):  <100     mg/dL   Optimal  899-870  mg/dL   Near or Above                    Optimal  130-159  mg/dL   Borderline  839-810  mg/dL   High  >809     mg/dL   Very High Performed at [facility], [address] [place], [place] [address]    HDL  Date Value Ref Range Status  07/12/2024 40 (L) >40 mg/dL Final  87/88/7975 47 >60 mg/dL Final   Triglycerides  Date Value Ref Range Status  07/12/2024 86 <150 mg/dL Final         Passed - Patient is not pregnant      Passed - Valid encounter within last 12 months    Recent Outpatient Visits           3 months ago Primary osteoarthritis of right knee   [place] [facility], Selinda PARAS, MD   3 months ago Primary osteoarthritis of right knee   [facility], Selinda PARAS, MD   5 months ago Primary osteoarthritis of right knee   [facility] at [facility], Selinda PARAS, MD   5 months ago History of CVA with residual deficit   [facility] at [facility], Selinda PARAS, MD               metoprolol  succinate (TOPROL -XL) 25 MG 24 hr tablet 90 tablet 0    Sig: Take 1 tablet (25 mg total) by mouth daily. Dose change     Cardiovascular:  Beta Blockers Failed - 11/01/2024  2:47 PM      Failed - Last BP in normal range    BP Readings from Last 1 Encounters:  10/05/24 (!) 137/96         Passed - Last Heart Rate in normal range    Pulse Readings from Last 1 Encounters:  10/05/24 82         Passed - Valid encounter within last 6 months    Recent Outpatient Visits           3 months ago Primary osteoarthritis of right knee   [place] [facility], Selinda PARAS, MD   3 months ago Primary osteoarthritis of right knee   [facility] at [facility], Selinda PARAS, MD   5 months ago Primary osteoarthritis of right knee   [facility] at [facility], Selinda PARAS, MD   5 months ago History of CVA with residual deficit   [facility] at [facility], Selinda PARAS, MD

## 2024-11-01 NOTE — Telephone Encounter (Signed)
 Requested medications are due for refill today.  yes  Requested medications are on the active medications list.  yes  Last refill. 05/26/2024 #90 0 rf  Future visit scheduled.   no  Notes to clinic.  Labs are expired    Requested Prescriptions  Pending Prescriptions Disp Refills   dapagliflozin  propanediol (FARXIGA ) 10 MG TABS tablet 90 tablet 0    Sig: Take 1 tablet (10 mg total) by mouth daily. NEEDS FOLLOW UP APPOINTMENT FOR MORE REFILLS     Endocrinology:  Diabetes - SGLT2 Inhibitors Failed - 11/01/2024  2:48 PM      Failed - HBA1C is between 0 and 7.9 and within 180 days    Hgb A1c MFr Bld  Date Value Ref Range Status  10/07/2023 5.8 (H) 4.8 - 5.6 % Final    Comment:             Prediabetes: 5.7 - 6.4          Diabetes: >6.4          Glycemic control for adults with diabetes: <7.0          Passed - Cr in normal range and within 360 days    Creatinine, Ser  Date Value Ref Range Status  08/23/2024 0.95 0.76 - 1.27 mg/dL Final         Passed - eGFR in normal range and within 360 days    GFR calc Af Amer  Date Value Ref Range Status  08/08/2019 >60 >60 mL/min Final   GFR, Estimated  Date Value Ref Range Status  07/25/2024 >60 >60 mL/min Final    Comment:    (NOTE) Calculated using the CKD-EPI Creatinine Equation (2021)    eGFR  Date Value Ref Range Status  08/23/2024 98 >59 mL/min/1.73 Final         Passed - Valid encounter within last 6 months    Recent Outpatient Visits           3 months ago Primary osteoarthritis of right knee   [facility], Selinda PARAS, MD   3 months ago Primary osteoarthritis of right knee   [facility] at [facility], Selinda PARAS, MD   5 months ago Primary osteoarthritis of right knee   [place] [facility], Selinda PARAS, MD   5 months ago History of CVA with residual deficit   [facility] at [facility], Selinda PARAS, MD              Signed Prescriptions Disp Refills   atorvastatin  (LIPITOR ) 80 MG tablet 90 tablet 0    Sig: Take 1 tablet (80 mg total) by mouth daily at 6 PM.     Cardiovascular:  Antilipid - Statins Failed - 11/01/2024  2:48 PM      Failed - Lipid Panel in normal range within the last 12 months    Cholesterol, Total  Date Value Ref Range Status  10/07/2023 179 100 - 199 mg/dL Final   Cholesterol  Date Value Ref Range Status  07/12/2024 104 0 - 200 mg/dL Final   LDL Chol Calc (NIH)  Date Value Ref Range Status  10/07/2023 114 (H) 0 - 99 mg/dL Final   LDL Cholesterol  Date Value Ref Range Status  07/12/2024 47 0 - 99 mg/dL Final    Comment:  Total Cholesterol/HDL:CHD Risk Coronary Heart Disease Risk Table                     Men   Women  1/2 Average Risk   3.4   3.3  Average Risk       5.0   4.4  2 X Average Risk   9.6   7.1  3 X Average Risk  23.4   11.0        Use the calculated Patient Ratio above and the CHD Risk Table to determine the patient's CHD Risk.        ATP III CLASSIFICATION (LDL):  <100     mg/dL   Optimal  899-870  mg/dL   Near or Above                    Optimal  130-159  mg/dL   Borderline  839-810  mg/dL   High  >809     mg/dL   Very High Performed at [facility], [address] [place], [place] [address]    HDL  Date Value Ref Range Status  07/12/2024 40 (L) >40 mg/dL Final  87/88/7975 47 >60 mg/dL Final   Triglycerides  Date Value Ref Range Status  07/12/2024 86 <150 mg/dL Final         Passed - Patient is not pregnant      Passed - Valid encounter within last 12 months    Recent Outpatient Visits           3 months ago Primary osteoarthritis of right knee   [place] [facility], Selinda PARAS, MD   3 months ago Primary osteoarthritis of right knee   [facility] at [facility], Selinda PARAS, MD   5 months ago Primary osteoarthritis of right knee   [facility] at [facility], Selinda PARAS, MD   5 months ago History of CVA with residual deficit   [facility] at [facility], Selinda PARAS, MD               metoprolol  succinate (TOPROL -XL) 25 MG 24 hr tablet 90 tablet 0    Sig: Take 1 tablet (25 mg total) by mouth daily. Dose change     Cardiovascular:  Beta Blockers Failed - 11/01/2024  2:48 PM      Failed - Last BP in normal range    BP Readings from Last 1 Encounters:  10/05/24 (!) 137/96         Passed - Last Heart Rate in normal range    Pulse Readings from Last 1 Encounters:  10/05/24 82         Passed - Valid encounter within last 6 months    Recent Outpatient Visits           3 months ago Primary osteoarthritis of right knee   [place] [facility], Selinda PARAS, MD   3 months ago Primary osteoarthritis of right knee   [facility], Selinda PARAS, MD   5 months ago Primary osteoarthritis of right knee   [facility] at [facility], Selinda PARAS, MD   5 months ago History of CVA with residual deficit  [facility] at [facility], Jason J, MD

## 2024-11-02 ENCOUNTER — Ambulatory Visit: Payer: Self-pay | Admitting: Family

## 2024-11-03 ENCOUNTER — Ambulatory Visit

## 2024-11-03 DIAGNOSIS — M6281 Muscle weakness (generalized): Secondary | ICD-10-CM

## 2024-11-03 DIAGNOSIS — I63512 Cerebral infarction due to unspecified occlusion or stenosis of left middle cerebral artery: Secondary | ICD-10-CM

## 2024-11-03 DIAGNOSIS — R278 Other lack of coordination: Secondary | ICD-10-CM

## 2024-11-04 ENCOUNTER — Other Ambulatory Visit: Payer: Self-pay

## 2024-11-06 NOTE — Therapy (Signed)
 " OUTPATIENT OCCUPATIONAL THERAPY NEURO TREATMENT NOTE  Patient Name: Ricardo Yang MRN: [id number] DOB:1976-07-08, 49 y.o., male Today's Date: 11/06/2024  REFERRING PROVIDER:  Lauraine Jonette BRAVO, PA-C  END OF SESSION:  OT End of Session - 11/06/24 1634     Visit Number 3    Number of Visits 24    Date for Recertification  01/09/25    Authorization Type [employer] [id number] for 24 OT vsts from 12/22-3/16/26 (Eval is included in this visit count d/t tx billed at eval)    Authorization Time Period Reporting period beginning 10/17/24    Authorization - Visit Number 3    Authorization - Number of Visits 24    Progress Note Due on Visit 10    OT Start Time 1445    OT Stop Time 1530    OT Time Calculation (min) 45 min    Equipment Utilized During Treatment none    Activity Tolerance Patient tolerated treatment well    Behavior During Therapy [facility] for tasks assessed/performed         Past Medical History:  Diagnosis Date   Acute CVA (cerebrovascular accident) (HCC) 10/04/2022   Acute ST elevation myocardial infarction (STEMI) involving left anterior descending (LAD) coronary artery (HCC) 08/06/2019   Anxiety 09/2022   After stroke   CAD (coronary artery disease) 10/04/2022   Cerebrovascular accident (CVA) (HCC) 10/07/2022   Chest pain    CHF (congestive heart failure) (HCC)    Cocaine abuse (HCC)    Cocaine use 10/06/2022   Depression December 2023   When stroke happened   Elevated troponin    Encounter for imaging to screen for metal prior to MRI 10/07/2022   Healthcare maintenance 10/07/2023   Heavy smoker    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 question possible MS   NSTEMI (non-ST elevated myocardial infarction) (HCC) 05/25/2015   Obesity    Polysubstance abuse (HCC)    a. tobacco, cocaine, crack, ecstasy, pills, anything but injections   Primary hypertension 10/05/2022   Sleep apnea    Always   STEMI (ST elevation myocardial  infarction) (HCC) 08/06/2019   Past Surgical History:  Procedure Laterality Date   CARDIAC CATHETERIZATION N/A 05/28/2015   Procedure: Left Heart Cath and Coronary Angiography;  Surgeon: Evalene JINNY Lunger, MD;  Location: [facility] INVASIVE CV LAB;  Service: Cardiovascular;  Laterality: N/A;   CORONARY ANGIOGRAPHY N/A 04/22/2021   Procedure: CORONARY ANGIOGRAPHY;  Surgeon: Ammon Blunt, MD;  Location: [facility] INVASIVE CV LAB;  Service: Cardiovascular;  Laterality: N/A;   CORONARY/GRAFT ACUTE MI REVASCULARIZATION N/A 08/06/2019   Procedure: Coronary/Graft Acute MI Revascularization;  Surgeon: Ammon Blunt, MD;  Location: [facility] INVASIVE CV LAB;  Service: Cardiovascular;  Laterality: N/A;   LEFT HEART CATH N/A 04/22/2021   Procedure: Left Heart Cath;  Surgeon: Ammon Blunt, MD;  Location: [facility] INVASIVE CV LAB;  Service: Cardiovascular;  Laterality: N/A;   LEFT HEART CATH AND CORONARY ANGIOGRAPHY N/A 08/06/2019   Procedure: LEFT HEART CATH AND CORONARY ANGIOGRAPHY;  Surgeon: Ammon Blunt, MD;  Location: [facility] INVASIVE CV LAB;  Service: Cardiovascular;  Laterality: N/A;   TEE WITHOUT CARDIOVERSION N/A 10/08/2022   Procedure: TRANSESOPHAGEAL ECHOCARDIOGRAM (TEE);  Surgeon: Darliss Rogue, MD;  Location: [facility] ORS;  Service: Cardiovascular;  Laterality: N/A;  11:30am   Patient Active Problem List   Diagnosis Date Noted   Chronic musculoskeletal pain 07/25/2024   Depression 05/25/2024   Biceps tendinitis, right 11/03/2023   Healthcare maintenance  10/07/2023   Sleep apnea 01/01/2023   Dysfunction of right rotator cuff 01/01/2023   History of CVA with residual deficit 12/04/2022   Mixed hyperlipidemia 12/04/2022   History of MI (myocardial infarction) 12/04/2022   Insomnia 10/17/2022   Slow transit constipation 10/17/2022   Osteoarthritis of right knee 10/17/2022   History of substance abuse (HCC) 10/15/2022   HFrEF (heart failure with reduced ejection fraction) (HCC)  10/07/2022   Expressive aphasia 10/05/2022   Right hemiplegia (HCC) 10/05/2022   CAD S/P percutaneous coronary angioplasty 10/05/2022   Polysubstance abuse (HCC) 10/04/2022   Essential hypertension 04/22/2021   Ischemic chest pain    ONSET DATE: 09/2022  REFERRING DIAG: CVA  THERAPY DIAG:  Muscle weakness (generalized)  Other lack of coordination  Left middle cerebral artery stroke (HCC)  Rationale for Evaluation and Treatment: Rehabilitation  SUBJECTIVE:   SUBJECTIVE STATEMENT: Pt reports that he wore the Metaflex glove home after his previous OT visit and worked with his hand while wearing the glove, but then he wasn't able to don the glove independently again.  Pt brought glove to session today per OT request.  Pt accompanied by: self  PERTINENT HISTORY:  Pt. is a 49 y.o. male who has a history of an ischemic stroke in December of 2023 with  residual right hemiparesis and dysphasia. PMHx includes: arthritis-for which he has finished radiation treatments on his knees via radiation oncology services, major depressive disorder, shoulder ostearthritis, and heart failure with an EF of 25%.  PRECAUTIONS:  None  WEIGHT BEARING RESTRICTIONS: No  PAIN: 11/03/24: 1-2/10 R shoulder; improves with moist heat Are you having pain? Arthritic pain in the right shoulder 1-2/10 during the eval  FALLS: Has patient fallen in last 6 months? No  LIVING ENVIRONMENT: Lives with: Resides alone Lives in: House/apartment-Resides in a [address]: one step  Has following equipment at home: Grab bars  PLOF: Independent  PATIENT GOALS:  Regain some everyday use with his right hand.  OBJECTIVE:  Note: Objective measures were completed at Evaluation unless otherwise noted.  HAND DOMINANCE: Right  ADLs:  Transfers/ambulation related to ADLs: Independent Eating: Uses the left hand,; tries to engage his right hand Grooming: holds the tooth brush with the right hand. UB Dressing: Uses  his left hand using one armed dressing techniques LB Dressing: Uses the left hand; Pt. reports that he manages to get it done. Toileting:  Pt. Uses his nondominant left hand Bathing:  Engages the right  hand to wash the left axilla region. Tub Shower transfers: Uses a shower chair Equipment:  grab bars shower, shower seat, cane  IADLs: Shopping: Uses the left hand Light housekeeping: Independent Meal Prep: Does not use dishes-uses paperware, plastic cups, and utensils. Community mobility:  Driving Medication management: Independent-stabilizes the bottle with the right hand, and uses the left hand to open Financial management: Reports having to plan things out more 2/2 with the right hand, and bilateral knee issues. Handwriting: 50% legible with left nondominant hand Typing: Is unable to type with the right hand-uses left hand Cellphone use: uses left hand- slower/more awkward Leisure/Hobbies: Video games Work History: professional Paediatric Nurse; cook  MOBILITY STATUS: Independent  FUNCTIONAL OUTCOME MEASURES: MAM-20: Eval: TBD   10/31/24: MAM-20 for musculoskeletal conditions: 10/26/24 (eval): 35/80 1= cannot do, 2=Very hard to do, 3=A little hard to do, 4=Easy to do Rating Choose only one number (definitions above)  1 Cut nails with a nail clipper  1 2. Tie shoes with laces  1  3. Cut meat on a plate   2 4. Wring a towel  3 5. Open a medicine bottle with a child proof cap/top   1 6. Zip a jacket  1 7. Button clothes (medium sized buttons)  1 8. Write 3-4 lines legibly  2 9. Take things/cards out of a wallet   3 10. Open a wide-mouth jar or bottle previously opened   2 11. Handle/count monty (bills and coins)  3 12. Pick up a 1/2 full water pitcher  1 13. Turn key (to open a door)  1 14. Squeeze toothpaste onto a toothbrush  3 15. Use spoon or fork  2 16. Brush or comb hair  2 17. Dial or key in telephone numbers  1 18. Brush teeth   3 19. Wash hands  1 20. Use hand(s) to eat a  sandwich   35/80       UPPER EXTREMITY ROM:    Active ROM Right Eval WFL Left Eval [facility]  Shoulder flexion    Shoulder abduction    Shoulder adduction    Shoulder extension    Shoulder internal rotation    Shoulder external rotation    Elbow flexion    Elbow extension    Wrist flexion    Wrist extension    Wrist ulnar deviation    Wrist radial deviation    Wrist pronation    Wrist supination    (Blank rows = not tested)  Active ROM Right Eval  Flex/ext Left Eval WFL  Thumb MCP (0-60)    Thumb IP (0-80) 55/35   Thumb Radial abd/add (0-55)     Thumb Palmar abd/add (0-45)     Thumb Opposition to Small Finger     Index MCP (0-90) Full/0    Index PIP (0-100) Full/40    Index DIP (0-70) Full/25     Long MCP (0-90)  Full/20    Long PIP (0-100) Full/25     Long DIP (0-70)  Full/15    Ring MCP (0-90) Full/25     Ring PIP (0-100) Full/20     Ring DIP (0-70)  Full/10    Little MCP (0-90)  Full/55    Little PIP (0-100)  Full/10    Little DIP (0-70) Full/0     (Blank rows = not tested)   10/17/2024: Pt. Is able to formulate a full composite fist with the right hand  UPPER EXTREMITY MMT:     MMT Right Eval  Left eval  Shoulder flexion 4- 5  Shoulder abduction 4- 5  Shoulder adduction    Shoulder extension    Shoulder internal rotation    Shoulder external rotation    Middle trapezius    Lower trapezius    Elbow flexion 4- 5  Elbow extension 4- 5  Wrist flexion    Wrist extension 4- 5  Wrist ulnar deviation    Wrist radial deviation    Wrist pronation    Wrist supination 3- 5  (Blank rows = not tested)  HAND FUNCTION: Grip strength: Right: 20 lbs; Left: 110 lbs, Lateral pinch: Right: 5 lbs, Left: 18 lbs, and 3 point pinch: Right: unable lbs, Left: 16 lbs  COORDINATION: 9 Hole Peg Test:  Eval: Pt. Is unable to grasp/remove one vertical 9-Hole peg  SENSATION: Light touch: WFL Proprioception: WFL  EDEMA: N/A  MUSCLE TONE:  Flexor tone in the  right hand  COGNITION: Overall cognitive status: Within functional limits for tasks assessed  VISION: Subjective report:  Requires readers now  PERCEPTION: Intact  PRAXIS:impaired                                                                                                                     TREATMENT DATE: 11/03/24  Moist heat placed to R shoulder for pain management/muscle relaxation, simultaneous to activities noted below.  Self Care: -Reviewed activities which pt attempted when wearing Metaflex glove at home: Pt listed R handed ADLs as encouraged by OT -OT assisted to donning glove today, with pt requiring max A, but noting easier donning with threading thumb last.  OT encouraged pt attempt this at home, and consider asking brother to assist with donning when needed, as pt reports he sees his brother almost daily and brother is always willing to assist pt as needed.  Pt agreed.  OT secured Ace wrap to wrist to allow velcro elastic bands to be anchored with slightly more tension at the thumb and IF.    Therapeutic Activity: -Facilitated R hand FMC skills and RUE GMC with participation in Connect 4 game.  Pt sorted pieces and practiced picking up pieces with R hand by sliding chips to edge of table.  Min vc to use L hand as a stabilizer to game board while placing chips overtop with the R hand with Metaflex glove donned.  -Constant monitoring for adjustment of extension bands to ensure secure grasp and adequate release with correct band tension, specifically at the R thumb, IF, and SF, as flexor tone increases with high reps of grasp/release activity.   PATIENT EDUCATION: Education details: Use and recommended wearing time for Metaflex compression glove  Person educated: Patient Education method: Explanation, Demonstration Education comprehension: verbalized understanding, returned demonstration, and verbal cues required  HOME EXERCISE PROGRAM: R handed grasp/release task practice  using Metaflex compression glove  GOALS: Goals reviewed with patient? Yes  SHORT TERM GOALS: Target date: 11/28/2024    Pt. Will be independent with HEP for RUE strength, and coordination skills. Baseline: Eval: No current HEP Goal status: INITIAL   LONG TERM GOALS: Target date: 01/09/2025   Pt. Will increase RUE strength by 2 muscle grades to assist with ADLs, and IADLs. Baseline: Eval:  RUE: shoulder flexion: 4-/5, abduction: 4-/5, elbow flexion: 4-/5, extension: 4-/5, wrist extension 4-/5, forearm supination: 3-/5 Goal status: INITIAL  2.  Pt. Will improve improve right digit extension to be able to independently assume a position of weightbearing, and proprioception through a flat hand. Baseline: Eval: Pt. Is initiating gross digit extension, however requires the assist of the left hand to assume a position of weightbearing. Goal status: INITIAL  3.  Pt. Will actively grasp and release 1 ADL objects efficiently 100% of the time with the right hand. Baseline: Eval: Pt. Is able to grasp  2 container, however is unable to actively release it from his hand Goal status: INITIAL  4.  Pt. Will increase right grip strength by 5# to be able to securely hold items of ADLs Baseline: Eval: Grip strength:  Right: 20#, Left: 110  Goal status: INITIAL  5.  Pt. Will increase right lateral pinch strength by 2# to be able to hold, and efficiently use utensils Baseline: Eval:Lateral Pinch: Right: 5#, Left: 18# Pt. has difficulty efficiently holding, and using utensils. Goal status: INITIAL  6.  Pt. Will identify/demonstrate adaptive/modifcations for ADLs/IADLs. Baseline: Eval: Education to be provided Goal status: INITIAL  ASSESSMENT:  CLINICAL IMPRESSION: Pt plans to ask brother for assist to don Metaflex compression glove at home as needed for pt to continue with high reps of grasp/release task practice, as pt reports he was unable to don glove himself last week.  Pt was able to play 3  games of Connect 4 using R hand to manipulate pieces from table top and into game board.  R hand coordination impairments continue to limit ability to efficiently pick up small flat items, but pt was able to slide game chips to edge of table with only occasional use of L hand to ensure pieces weren't dropped.  Pt will continue to benefit from OT services to work towards improving dominant RUE functioning in order increase engagement in, and maximize independence with ADLs, and IADLs.  PERFORMANCE DEFICITS: in functional skills including ADLs, IADLs, coordination, dexterity, ROM, strength, pain, Fine motor control, Gross motor control, and UE functional use, cognitive skills including, and psychosocial skills including coping strategies, environmental adaptation, and routines and behaviors.   IMPAIRMENTS: are limiting patient from ADLs, IADLs, and leisure.   CO-MORBIDITIES: may have co-morbidities  that affects occupational performance. Patient will benefit from skilled OT to address above impairments and improve overall function.  MODIFICATION OR ASSISTANCE TO COMPLETE EVALUATION: Min-Moderate modification of tasks or assist with assess necessary to complete an evaluation.  OT OCCUPATIONAL PROFILE AND HISTORY: Detailed assessment: Review of records and additional review of physical, cognitive, psychosocial history related to current functional performance.  CLINICAL DECISION MAKING: Moderate - several treatment options, min-mod task modification necessary  REHAB POTENTIAL: Good  EVALUATION COMPLEXITY: Moderate    PLAN:  OT FREQUENCY: 1-2x/week  OT DURATION: 12 weeks  PLANNED INTERVENTIONS: 97168 OT Re-evaluation, 97535 self care/ADL training, 02889 therapeutic exercise, 97530 therapeutic activity, 97112 neuromuscular re-education, 97140 manual therapy, 97018 paraffin, 02989 moist heat, 97034 contrast bath, 97760 Orthotic Initial, 97763 Orthotic/Prosthetic subsequent, energy conservation,  patient/family education, and DME and/or AE instructions  RECOMMENDED OTHER SERVICES: N/A  CONSULTED AND AGREED WITH PLAN OF CARE: Patient  PLAN FOR NEXT SESSION: Treatment   Inocente Blazing, MS, OTR/L   11/06/2024, 4:36 PM           "

## 2024-11-07 ENCOUNTER — Ambulatory Visit

## 2024-11-07 DIAGNOSIS — R278 Other lack of coordination: Secondary | ICD-10-CM

## 2024-11-07 DIAGNOSIS — M6281 Muscle weakness (generalized): Secondary | ICD-10-CM | POA: Diagnosis not present

## 2024-11-07 DIAGNOSIS — I63512 Cerebral infarction due to unspecified occlusion or stenosis of left middle cerebral artery: Secondary | ICD-10-CM

## 2024-11-07 NOTE — Therapy (Unsigned)
 " OUTPATIENT OCCUPATIONAL THERAPY NEURO TREATMENT NOTE  Patient Name: Ricardo Yang MRN: [id number] DOB:05/14/1976, 49 y.o., male Today's Date: 11/08/2024  REFERRING PROVIDER:  Lauraine Jonette BRAVO, PA-C  END OF SESSION:  OT End of Session - 11/08/24 1927     Visit Number 4    Number of Visits 24    Date for Recertification  01/09/25    Authorization Type [employer] [id number] for 24 OT vsts from 12/22-3/16/26 (Eval is included in this visit count d/t tx billed at eval)    Authorization Time Period Reporting period beginning 10/17/24    Authorization - Visit Number 4    Authorization - Number of Visits 24    Progress Note Due on Visit 10    OT Start Time 1145    OT Stop Time 1230    OT Time Calculation (min) 45 min    Equipment Utilized During Treatment none    Activity Tolerance Patient tolerated treatment well    Behavior During Therapy [facility] for tasks assessed/performed          Past Medical History:  Diagnosis Date   Acute CVA (cerebrovascular accident) (HCC) 10/04/2022   Acute ST elevation myocardial infarction (STEMI) involving left anterior descending (LAD) coronary artery (HCC) 08/06/2019   Anxiety 09/2022   After stroke   CAD (coronary artery disease) 10/04/2022   Cerebrovascular accident (CVA) (HCC) 10/07/2022   Chest pain    CHF (congestive heart failure) (HCC)    Cocaine abuse (HCC)    Cocaine use 10/06/2022   Depression December 2023   When stroke happened   Elevated troponin    Encounter for imaging to screen for metal prior to MRI 10/07/2022   Healthcare maintenance 10/07/2023   Heavy smoker    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 question possible MS   NSTEMI (non-ST elevated myocardial infarction) (HCC) 05/25/2015   Obesity    Polysubstance abuse (HCC)    a. tobacco, cocaine, crack, ecstasy, pills, anything but injections   Primary hypertension 10/05/2022   Sleep apnea    Always   STEMI (ST elevation myocardial  infarction) (HCC) 08/06/2019   Past Surgical History:  Procedure Laterality Date   CARDIAC CATHETERIZATION N/A 05/28/2015   Procedure: Left Heart Cath and Coronary Angiography;  Surgeon: Evalene JINNY Lunger, MD;  Location: [facility] INVASIVE CV LAB;  Service: Cardiovascular;  Laterality: N/A;   CORONARY ANGIOGRAPHY N/A 04/22/2021   Procedure: CORONARY ANGIOGRAPHY;  Surgeon: Ammon Blunt, MD;  Location: [facility] INVASIVE CV LAB;  Service: Cardiovascular;  Laterality: N/A;   CORONARY/GRAFT ACUTE MI REVASCULARIZATION N/A 08/06/2019   Procedure: Coronary/Graft Acute MI Revascularization;  Surgeon: Ammon Blunt, MD;  Location: [facility] INVASIVE CV LAB;  Service: Cardiovascular;  Laterality: N/A;   LEFT HEART CATH N/A 04/22/2021   Procedure: Left Heart Cath;  Surgeon: Ammon Blunt, MD;  Location: [facility] INVASIVE CV LAB;  Service: Cardiovascular;  Laterality: N/A;   LEFT HEART CATH AND CORONARY ANGIOGRAPHY N/A 08/06/2019   Procedure: LEFT HEART CATH AND CORONARY ANGIOGRAPHY;  Surgeon: Ammon Blunt, MD;  Location: [facility] INVASIVE CV LAB;  Service: Cardiovascular;  Laterality: N/A;   TEE WITHOUT CARDIOVERSION N/A 10/08/2022   Procedure: TRANSESOPHAGEAL ECHOCARDIOGRAM (TEE);  Surgeon: Darliss Rogue, MD;  Location: [facility] ORS;  Service: Cardiovascular;  Laterality: N/A;  11:30am   Patient Active Problem List   Diagnosis Date Noted   Chronic musculoskeletal pain 07/25/2024   Depression 05/25/2024   Biceps tendinitis, right 11/03/2023   Healthcare  maintenance 10/07/2023   Sleep apnea 01/01/2023   Dysfunction of right rotator cuff 01/01/2023   History of CVA with residual deficit 12/04/2022   Mixed hyperlipidemia 12/04/2022   History of MI (myocardial infarction) 12/04/2022   Insomnia 10/17/2022   Slow transit constipation 10/17/2022   Osteoarthritis of right knee 10/17/2022   History of substance abuse (HCC) 10/15/2022   HFrEF (heart failure with reduced ejection fraction) (HCC)  10/07/2022   Expressive aphasia 10/05/2022   Right hemiplegia (HCC) 10/05/2022   CAD S/P percutaneous coronary angioplasty 10/05/2022   Polysubstance abuse (HCC) 10/04/2022   Essential hypertension 04/22/2021   Ischemic chest pain    ONSET DATE: 09/2022  REFERRING DIAG: CVA  THERAPY DIAG:  Muscle weakness (generalized)  Other lack of coordination  Left middle cerebral artery stroke (HCC)  Rationale for Evaluation and Treatment: Rehabilitation  SUBJECTIVE:   SUBJECTIVE STATEMENT: Pt receptive to Vivistim education and Fugl-Meyer assessment today. Pt accompanied by: self  PERTINENT HISTORY:  Pt. is a 49 y.o. male who has a history of an ischemic stroke in December of 2023 with  residual right hemiparesis and dysphasia. PMHx includes: arthritis-for which he has finished radiation treatments on his knees via radiation oncology services, major depressive disorder, shoulder ostearthritis, and heart failure with an EF of 25%.  PRECAUTIONS:  None  WEIGHT BEARING RESTRICTIONS: No  PAIN: 11/07/24: 1/10 R shoulder; improves with moist heat Are you having pain? Arthritic pain in the right shoulder 1-2/10 during the eval  FALLS: Has patient fallen in last 6 months? No  LIVING ENVIRONMENT: Lives with: Resides alone Lives in: House/apartment-Resides in a [id number]: one step  Has following equipment at home: Grab bars  PLOF: Independent  PATIENT GOALS:  Regain some everyday use with his right hand.  OBJECTIVE:  Note: Objective measures were completed at Evaluation unless otherwise noted.  HAND DOMINANCE: Right  ADLs:  Transfers/ambulation related to ADLs: Independent Eating: Uses the left hand,; tries to engage his right hand Grooming: holds the tooth brush with the right hand. UB Dressing: Uses his left hand using one armed dressing techniques LB Dressing: Uses the left hand; Pt. reports that he manages to get it done. Toileting:  Pt. Uses his nondominant left  hand Bathing:  Engages the right  hand to wash the left axilla region. Tub Shower transfers: Uses a shower chair Equipment:  grab bars shower, shower seat, cane  IADLs: Shopping: Uses the left hand Light housekeeping: Independent Meal Prep: Does not use dishes-uses paperware, plastic cups, and utensils. Community mobility:  Driving Medication management: Independent-stabilizes the bottle with the right hand, and uses the left hand to open Financial management: Reports having to plan things out more 2/2 with the right hand, and bilateral knee issues. Handwriting: 50% legible with left nondominant hand Typing: Is unable to type with the right hand-uses left hand Cellphone use: uses left hand- slower/more awkward Leisure/Hobbies: Video games Work History: professional Paediatric Nurse; cook  MOBILITY STATUS: Independent  FUNCTIONAL OUTCOME MEASURES: MAM-20: Eval: TBD   10/31/24: MAM-20 for musculoskeletal conditions: 10/26/24 (eval): 35/80 1= cannot do, 2=Very hard to do, 3=A little hard to do, 4=Easy to do Rating Choose only one number (definitions above)  1 Cut nails with a nail clipper  1 2. Tie shoes with laces  1 3. Cut meat on a plate   2 4. Wring a towel  3 5. Open a medicine bottle with a child proof cap/top   1 6. Zip a jacket  1 7.  Button clothes (medium sized buttons)  1 8. Write 3-4 lines legibly  2 9. Take things/cards out of a wallet   3 10. Open a wide-mouth jar or bottle previously opened   2 11. Handle/count monty (bills and coins)  3 12. Pick up a 1/2 full water pitcher  1 13. Turn key (to open a door)  1 14. Squeeze toothpaste onto a toothbrush  3 15. Use spoon or fork  2 16. Brush or comb hair  2 17. Dial or key in telephone numbers  1 18. Brush teeth   3 19. Wash hands  1 20. Use hand(s) to eat a sandwich   35/80       UPPER EXTREMITY ROM:    Active ROM Right Eval WFL Left Eval [facility]  Shoulder flexion    Shoulder abduction    Shoulder adduction     Shoulder extension    Shoulder internal rotation    Shoulder external rotation    Elbow flexion    Elbow extension    Wrist flexion    Wrist extension    Wrist ulnar deviation    Wrist radial deviation    Wrist pronation    Wrist supination    (Blank rows = not tested)  Active ROM Right Eval  Flex/ext Left Eval WFL  Thumb MCP (0-60)    Thumb IP (0-80) 55/35   Thumb Radial abd/add (0-55)     Thumb Palmar abd/add (0-45)     Thumb Opposition to Small Finger     Index MCP (0-90) Full/0    Index PIP (0-100) Full/40    Index DIP (0-70) Full/25     Long MCP (0-90)  Full/20    Long PIP (0-100) Full/25     Long DIP (0-70)  Full/15    Ring MCP (0-90) Full/25     Ring PIP (0-100) Full/20     Ring DIP (0-70)  Full/10    Little MCP (0-90)  Full/55    Little PIP (0-100)  Full/10    Little DIP (0-70) Full/0     (Blank rows = not tested)   10/17/2024: Pt. Is able to formulate a full composite fist with the right hand  UPPER EXTREMITY MMT:     MMT Right Eval  Left eval  Shoulder flexion 4- 5  Shoulder abduction 4- 5  Shoulder adduction    Shoulder extension    Shoulder internal rotation    Shoulder external rotation    Middle trapezius    Lower trapezius    Elbow flexion 4- 5  Elbow extension 4- 5  Wrist flexion    Wrist extension 4- 5  Wrist ulnar deviation    Wrist radial deviation    Wrist pronation    Wrist supination 3- 5  (Blank rows = not tested)  FUGL-MEYER ASSESSMENT   A. Upper Extremity   I. Reflex Activity:   11/07/24   Flexors (biceps) 2   Extensors (triceps) 2   Total 4/4                 II. Volitional Movement within Synergies:   11/07/24   Flexor Synergy:      Shoulder Elevation 1   Shoulder Retraction 2   Shoulder Abduction (at least 90*) 2   External Rotation 1   Elbow Flexion 2   Forearm Supination 1   Extensor Synergy:      Shoulder Adduction/Internal Rotation 2   Elbow Extension 2   Forearm Pronation 2  Total 15/18     III.  Movement Combining Synergies:      Hand to Lumbar Spine 2   Shoulder Flexion at 90*, elbow at 0* 1   Pron/Sup w/elbow at 90* and shoulder at 0* 1 (lacking full supination)   Total 4/6     IV. Movement Out of Synergy      Shoulder Abduction to 90*, elbow at 0*, forearm pronated 2   Shoulder Flexion 90-180*, elbow at 0*, forearm in mid position 2 (limited by R shoulder pain, but forearm slightly over pronates/IR   Pron/sup, elbow at 0* and shoulder between 30-90* of flexion 1 (elbow flexes slightly and limited supination)   Total 5/6       B. Wrist      Stability of wrist at 15* ext, elbow at 90*, shoulder 0* 2   Repeated flex/ext, elbow at 90*, shoulder 0* 1   Stability of wrist at 15* ext, elbow at 0*, shoulder 30* 2   Flex/ext, elbow 0*, shoulder 30* 1   Circumduction  1   Total 7/10     C. Hand      Mass Flexion 1 (limited DIP flexion)    Mass Extension 1   Total 2/4       D. Grasp      Hook  0   Thumb Adduction - paper 1   Pincer - pen 0   Cylindrical - cup/can 2   Spherical - tennis ball 0 (2nd digit does not wrap around ball but does flex)   Total 3/10     E. Coordination/Speed      Tremor 1   Dysmetria 1   Time L 3.38, R 6.92 1   Total 3/6     FUGL-MEYER ASSESSMENT: UPPER EXTREMITY A-E TOTAL on (date): 43/66  HAND FUNCTION: Grip strength: Right: 20 lbs; Left: 110 lbs, Lateral pinch: Right: 5 lbs, Left: 18 lbs, and 3 point pinch: Right: unable lbs, Left: 16 lbs  COORDINATION: 9 Hole Peg Test:  Eval: Pt. Is unable to grasp/remove one vertical 9-Hole peg  SENSATION: Light touch: WFL Proprioception: WFL  EDEMA: N/A  MUSCLE TONE:  Flexor tone in the right hand  COGNITION: Overall cognitive status: Within functional limits for tasks assessed  VISION: Subjective report:  Requires readers now  PERCEPTION: Intact  PRAXIS:impaired                                                                                                                      TREATMENT DATE: 11/08/24  Moist heat placed to R shoulder for pain management/muscle relaxation, simultaneous to activities noted below.  Self Care: -Condition management education:  -Educated pt on Vivistim procedure, use of Fugl-Meyer to help determine whether pt may be a candidate for pursuing this procedure, and determining factors that qualify pt as a potential implant candidate. -Issued Vivistim pt/educational pamphlet and provided Vivistim website with encouragement for pt to review website for additional specifics of this procedure as well as patient  experiences.  Physical Performance Testing: -Completed Fugl-Meyer assessment.  See scoring above.   PATIENT EDUCATION: Education details: Vivistim; Fugl-Meyer assessment Person educated: Patient Education method: Explanation Education comprehension: verbalized understanding and needs further education  HOME EXERCISE PROGRAM: R handed grasp/release task practice using Metaflex compression glove  GOALS: Goals reviewed with patient? Yes  SHORT TERM GOALS: Target date: 11/28/2024    Pt. Will be independent with HEP for RUE strength, and coordination skills. Baseline: Eval: No current HEP Goal status: INITIAL   LONG TERM GOALS: Target date: 01/09/2025   Pt. Will increase RUE strength by 2 muscle grades to assist with ADLs, and IADLs. Baseline: Eval:  RUE: shoulder flexion: 4-/5, abduction: 4-/5, elbow flexion: 4-/5, extension: 4-/5, wrist extension 4-/5, forearm supination: 3-/5 Goal status: INITIAL  2.  Pt. Will improve improve right digit extension to be able to independently assume a position of weightbearing, and proprioception through a flat hand. Baseline: Eval: Pt. Is initiating gross digit extension, however requires the assist of the left hand to assume a position of weightbearing. Goal status: INITIAL  3.  Pt. Will actively grasp and release 1 ADL objects efficiently 100% of the time with the right hand. Baseline:  Eval: Pt. Is able to grasp  2 container, however is unable to actively release it from his hand Goal status: INITIAL  4.  Pt. Will increase right grip strength by 5# to be able to securely hold items of ADLs Baseline: Eval: Grip strength: Right: 20#, Left: 110  Goal status: INITIAL  5.  Pt. Will increase right lateral pinch strength by 2# to be able to hold, and efficiently use utensils Baseline: Eval:Lateral Pinch: Right: 5#, Left: 18# Pt. has difficulty efficiently holding, and using utensils. Goal status: INITIAL  6.  Pt. Will identify/demonstrate adaptive/modifcations for ADLs/IADLs. Baseline: Eval: Education to be provided Goal status: INITIAL  ASSESSMENT:  CLINICAL IMPRESSION: Pt receptive to Vivistim education today and eager to learn additional information re: proceduce, and whether he may be appropriate to pursue implantation.  Pt scored 43/66 on Fugl-Meyer assessment this date, indicating within limits of scoring to further pursue Vivistim.  See below for additional pt qualifying criteria.  OT encouraged pt to further review Vivistim website and pamphlet, return with questions as needed, and will discuss in follow up sessions whether pt would like a follow up call from Vivistim nurse navigator.  Pt verbalized understanding, and continues to report eagerness to do what he can to get more function out of his dominant arm. Pt will continue to benefit from OT services to work towards improving dominant RUE functioning in order increase engagement in, and maximize independence with ADLs, and IADLs.  Vivistim Checklist Stroke Chronicity: >6 months post ischemic stroke  Affected Side: R upper extremity  Hand Dominance: R handed prior to CVA; currently uses L hand a majority of the time due to deficits with R dominant side.  Fugl-Meyer Score (UE):  43/66 (LEAPS Version) indicating moderate to severe impairment  ADL/IADL Status: Pt demonstrates functional deficits with RUE ROM,  coordination, and strength that negatively affects occupational performance and overall level of independence.    Currently, this pt is requiring increased time and compensation with use of non-dominant LUE to complete daily tasks (see Objective section for further details).  Spasticity: Spasticity is sufficiently managed with passive stretching to participate in high repetition therapy.  General Therapy History: Pt has been seen for RUE intervention in the outpatient setting(s) to address functional deficits following CVA.   Rehab  Potential:  The patient is motivated to perform intensive outpatient occupational therapy.      PERFORMANCE DEFICITS: in functional skills including ADLs, IADLs, coordination, dexterity, ROM, strength, pain, Fine motor control, Gross motor control, and UE functional use, cognitive skills including, and psychosocial skills including coping strategies, environmental adaptation, and routines and behaviors.   IMPAIRMENTS: are limiting patient from ADLs, IADLs, and leisure.   CO-MORBIDITIES: may have co-morbidities  that affects occupational performance. Patient will benefit from skilled OT to address above impairments and improve overall function.  MODIFICATION OR ASSISTANCE TO COMPLETE EVALUATION: Min-Moderate modification of tasks or assist with assess necessary to complete an evaluation.  OT OCCUPATIONAL PROFILE AND HISTORY: Detailed assessment: Review of records and additional review of physical, cognitive, psychosocial history related to current functional performance.  CLINICAL DECISION MAKING: Moderate - several treatment options, min-mod task modification necessary  REHAB POTENTIAL: Good  EVALUATION COMPLEXITY: Moderate    PLAN:  OT FREQUENCY: 1-2x/week  OT DURATION: 12 weeks  PLANNED INTERVENTIONS: 97168 OT Re-evaluation, 97535 self care/ADL training, 02889 therapeutic exercise, 97530 therapeutic activity, 97112 neuromuscular re-education, 97140 manual  therapy, 97018 paraffin, 02989 moist heat, 97034 contrast bath, 97760 Orthotic Initial, 97763 Orthotic/Prosthetic subsequent, energy conservation, patient/family education, and DME and/or AE instructions  RECOMMENDED OTHER SERVICES: N/A  CONSULTED AND AGREED WITH PLAN OF CARE: Patient  PLAN FOR NEXT SESSION: Treatment   Inocente Blazing, MS, OTR/L   11/08/2024, 7:29 PM           "

## 2024-11-09 ENCOUNTER — Ambulatory Visit

## 2024-11-09 DIAGNOSIS — R278 Other lack of coordination: Secondary | ICD-10-CM

## 2024-11-09 DIAGNOSIS — M6281 Muscle weakness (generalized): Secondary | ICD-10-CM | POA: Diagnosis not present

## 2024-11-09 DIAGNOSIS — I63512 Cerebral infarction due to unspecified occlusion or stenosis of left middle cerebral artery: Secondary | ICD-10-CM

## 2024-11-10 ENCOUNTER — Ambulatory Visit: Admitting: Occupational Therapy

## 2024-11-12 NOTE — Therapy (Signed)
 " OUTPATIENT OCCUPATIONAL THERAPY NEURO TREATMENT NOTE  Patient Name: Ricardo Yang MRN: [id number] DOB:October 19, 1976, 49 y.o., male Today's Date: 11/12/2024  REFERRING PROVIDER:  Lauraine Jonette BRAVO, PA-C  END OF SESSION:  OT End of Session - 11/12/24 1851     Visit Number 5    Number of Visits 24    Date for Recertification  01/09/25    Authorization Type [employer] [id number] for 24 OT vsts from 12/22-3/16/26 (Eval is included in this visit count d/t tx billed at eval)    Authorization Time Period Reporting period beginning 10/17/24    Authorization - Visit Number 5    Authorization - Number of Visits 24    Progress Note Due on Visit 10    OT Start Time 1220    OT Stop Time 1305    OT Time Calculation (min) 45 min    Equipment Utilized During Treatment none    Activity Tolerance Patient tolerated treatment well    Behavior During Therapy [facility] for tasks assessed/performed         Past Medical History:  Diagnosis Date   Acute CVA (cerebrovascular accident) (HCC) 10/04/2022   Acute ST elevation myocardial infarction (STEMI) involving left anterior descending (LAD) coronary artery (HCC) 08/06/2019   Anxiety 09/2022   After stroke   CAD (coronary artery disease) 10/04/2022   Cerebrovascular accident (CVA) (HCC) 10/07/2022   Chest pain    CHF (congestive heart failure) (HCC)    Cocaine abuse (HCC)    Cocaine use 10/06/2022   Depression December 2023   When stroke happened   Elevated troponin    Encounter for imaging to screen for metal prior to MRI 10/07/2022   Healthcare maintenance 10/07/2023   Heavy smoker    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 question possible MS   NSTEMI (non-ST elevated myocardial infarction) (HCC) 05/25/2015   Obesity    Polysubstance abuse (HCC)    a. tobacco, cocaine, crack, ecstasy, pills, anything but injections   Primary hypertension 10/05/2022   Sleep apnea    Always   STEMI (ST elevation myocardial  infarction) (HCC) 08/06/2019   Past Surgical History:  Procedure Laterality Date   CARDIAC CATHETERIZATION N/A 05/28/2015   Procedure: Left Heart Cath and Coronary Angiography;  Surgeon: Evalene JINNY Lunger, MD;  Location: [facility] INVASIVE CV LAB;  Service: Cardiovascular;  Laterality: N/A;   CORONARY ANGIOGRAPHY N/A 04/22/2021   Procedure: CORONARY ANGIOGRAPHY;  Surgeon: Ammon Blunt, MD;  Location: [facility] INVASIVE CV LAB;  Service: Cardiovascular;  Laterality: N/A;   CORONARY/GRAFT ACUTE MI REVASCULARIZATION N/A 08/06/2019   Procedure: Coronary/Graft Acute MI Revascularization;  Surgeon: Ammon Blunt, MD;  Location: [facility] INVASIVE CV LAB;  Service: Cardiovascular;  Laterality: N/A;   LEFT HEART CATH N/A 04/22/2021   Procedure: Left Heart Cath;  Surgeon: Ammon Blunt, MD;  Location: [facility] INVASIVE CV LAB;  Service: Cardiovascular;  Laterality: N/A;   LEFT HEART CATH AND CORONARY ANGIOGRAPHY N/A 08/06/2019   Procedure: LEFT HEART CATH AND CORONARY ANGIOGRAPHY;  Surgeon: Ammon Blunt, MD;  Location: [facility] INVASIVE CV LAB;  Service: Cardiovascular;  Laterality: N/A;   TEE WITHOUT CARDIOVERSION N/A 10/08/2022   Procedure: TRANSESOPHAGEAL ECHOCARDIOGRAM (TEE);  Surgeon: Darliss Rogue, MD;  Location: [facility] ORS;  Service: Cardiovascular;  Laterality: N/A;  11:30am   Patient Active Problem List   Diagnosis Date Noted   Chronic musculoskeletal pain 07/25/2024   Depression 05/25/2024   Biceps tendinitis, right 11/03/2023   Healthcare maintenance  10/07/2023   Sleep apnea 01/01/2023   Dysfunction of right rotator cuff 01/01/2023   History of CVA with residual deficit 12/04/2022   Mixed hyperlipidemia 12/04/2022   History of MI (myocardial infarction) 12/04/2022   Insomnia 10/17/2022   Slow transit constipation 10/17/2022   Osteoarthritis of right knee 10/17/2022   History of substance abuse (HCC) 10/15/2022   HFrEF (heart failure with reduced ejection fraction) (HCC)  10/07/2022   Expressive aphasia 10/05/2022   Right hemiplegia (HCC) 10/05/2022   CAD S/P percutaneous coronary angioplasty 10/05/2022   Polysubstance abuse (HCC) 10/04/2022   Essential hypertension 04/22/2021   Ischemic chest pain    ONSET DATE: 09/2022  REFERRING DIAG: CVA  THERAPY DIAG:  Muscle weakness (generalized)  Other lack of coordination  Left middle cerebral artery stroke (HCC)  Rationale for Evaluation and Treatment: Rehabilitation  SUBJECTIVE:   SUBJECTIVE STATEMENT: Pt reports he was able to get on the [employer] website and is eager to have follow up from [employer] nurse navigator to learn more about the procedure. Pt accompanied by: self  PERTINENT HISTORY:  Pt. is a 49 y.o. male who has a history of an ischemic stroke in December of 2023 with  residual right hemiparesis and dysphasia. PMHx includes: arthritis-for which he has finished radiation treatments on his knees via radiation oncology services, major depressive disorder, shoulder ostearthritis, and heart failure with an EF of 25%.  PRECAUTIONS:  None  WEIGHT BEARING RESTRICTIONS: No  PAIN: 11/09/24: 1/10 R shoulder; improves with moist heat Are you having pain? Arthritic pain in the right shoulder 1-2/10 during the eval  FALLS: Has patient fallen in last 6 months? No  LIVING ENVIRONMENT: Lives with: Resides alone Lives in: House/apartment-Resides in a [address]: one step  Has following equipment at home: Grab bars  PLOF: Independent  PATIENT GOALS:  Regain some everyday use with his right hand.  OBJECTIVE:  Note: Objective measures were completed at Evaluation unless otherwise noted.  HAND DOMINANCE: Right  ADLs:  Transfers/ambulation related to ADLs: Independent Eating: Uses the left hand,; tries to engage his right hand Grooming: holds the tooth brush with the right hand. UB Dressing: Uses his left hand using one armed dressing techniques LB Dressing: Uses the left hand; Pt.  reports that he manages to get it done. Toileting:  Pt. Uses his nondominant left hand Bathing:  Engages the right  hand to wash the left axilla region. Tub Shower transfers: Uses a shower chair Equipment:  grab bars shower, shower seat, cane  IADLs: Shopping: Uses the left hand Light housekeeping: Independent Meal Prep: Does not use dishes-uses paperware, plastic cups, and utensils. Community mobility:  Driving Medication management: Independent-stabilizes the bottle with the right hand, and uses the left hand to open Financial management: Reports having to plan things out more 2/2 with the right hand, and bilateral knee issues. Handwriting: 50% legible with left nondominant hand Typing: Is unable to type with the right hand-uses left hand Cellphone use: uses left hand- slower/more awkward Leisure/Hobbies: Video games Work History: professional Paediatric Nurse; cook  MOBILITY STATUS: Independent  FUNCTIONAL OUTCOME MEASURES: MAM-20: Eval: TBD   10/31/24: MAM-20 for musculoskeletal conditions: 10/26/24 (eval): 35/80 1= cannot do, 2=Very hard to do, 3=A little hard to do, 4=Easy to do Rating Choose only one number (definitions above)  1 Cut nails with a nail clipper  1 2. Tie shoes with laces  1 3. Cut meat on a plate   2 4. Wring a towel  3 5. Open  a medicine bottle with a child proof cap/top   1 6. Zip a jacket  1 7. Button clothes (medium sized buttons)  1 8. Write 3-4 lines legibly  2 9. Take things/cards out of a wallet   3 10. Open a wide-mouth jar or bottle previously opened   2 11. Handle/count monty (bills and coins)  3 12. Pick up a 1/2 full water pitcher  1 13. Turn key (to open a door)  1 14. Squeeze toothpaste onto a toothbrush  3 15. Use spoon or fork  2 16. Brush or comb hair  2 17. Dial or key in telephone numbers  1 18. Brush teeth   3 19. Wash hands  1 20. Use hand(s) to eat a sandwich   35/80       UPPER EXTREMITY ROM:    Active ROM Right Eval WFL  Left Eval [facility]  Shoulder flexion    Shoulder abduction    Shoulder adduction    Shoulder extension    Shoulder internal rotation    Shoulder external rotation    Elbow flexion    Elbow extension    Wrist flexion    Wrist extension    Wrist ulnar deviation    Wrist radial deviation    Wrist pronation    Wrist supination    (Blank rows = not tested)  Active ROM Right Eval  Flex/ext Left Eval WFL  Thumb MCP (0-60)    Thumb IP (0-80) 55/35   Thumb Radial abd/add (0-55)     Thumb Palmar abd/add (0-45)     Thumb Opposition to Small Finger     Index MCP (0-90) Full/0    Index PIP (0-100) Full/40    Index DIP (0-70) Full/25     Long MCP (0-90)  Full/20    Long PIP (0-100) Full/25     Long DIP (0-70)  Full/15    Ring MCP (0-90) Full/25     Ring PIP (0-100) Full/20     Ring DIP (0-70)  Full/10    Little MCP (0-90)  Full/55    Little PIP (0-100)  Full/10    Little DIP (0-70) Full/0     (Blank rows = not tested)   10/17/2024: Pt. Is able to formulate a full composite fist with the right hand  UPPER EXTREMITY MMT:     MMT Right Eval  Left eval  Shoulder flexion 4- 5  Shoulder abduction 4- 5  Shoulder adduction    Shoulder extension    Shoulder internal rotation    Shoulder external rotation    Middle trapezius    Lower trapezius    Elbow flexion 4- 5  Elbow extension 4- 5  Wrist flexion    Wrist extension 4- 5  Wrist ulnar deviation    Wrist radial deviation    Wrist pronation    Wrist supination 3- 5  (Blank rows = not tested)  FUGL-MEYER ASSESSMENT   A. Upper Extremity   I. Reflex Activity:   11/07/24   Flexors (biceps) 2   Extensors (triceps) 2   Total 4/4                 II. Volitional Movement within Synergies:   11/07/24   Flexor Synergy:      Shoulder Elevation 1   Shoulder Retraction 2   Shoulder Abduction (at least 90*) 2   External Rotation 1   Elbow Flexion 2   Forearm Supination 1   Extensor Synergy:  Shoulder Adduction/Internal  Rotation 2   Elbow Extension 2   Forearm Pronation 2   Total 15/18     III. Movement Combining Synergies:      Hand to Lumbar Spine 2   Shoulder Flexion at 90*, elbow at 0* 1   Pron/Sup w/elbow at 90* and shoulder at 0* 1 (lacking full supination)   Total 4/6     IV. Movement Out of Synergy      Shoulder Abduction to 90*, elbow at 0*, forearm pronated 2   Shoulder Flexion 90-180*, elbow at 0*, forearm in mid position 2 (limited by R shoulder pain, but forearm slightly over pronates/IR   Pron/sup, elbow at 0* and shoulder between 30-90* of flexion 1 (elbow flexes slightly and limited supination)   Total 5/6       B. Wrist      Stability of wrist at 15* ext, elbow at 90*, shoulder 0* 2   Repeated flex/ext, elbow at 90*, shoulder 0* 1   Stability of wrist at 15* ext, elbow at 0*, shoulder 30* 2   Flex/ext, elbow 0*, shoulder 30* 1   Circumduction  1   Total 7/10     C. Hand      Mass Flexion 1 (limited DIP flexion)    Mass Extension 1   Total 2/4       D. Grasp      Hook  0   Thumb Adduction - paper 1   Pincer - pen 0   Cylindrical - cup/can 2   Spherical - tennis ball 0 (2nd digit does not wrap around ball but does flex)   Total 3/10     E. Coordination/Speed      Tremor 1   Dysmetria 1   Time L 3.38, R 6.92 1   Total 3/6     FUGL-MEYER ASSESSMENT: UPPER EXTREMITY A-E TOTAL on (date): 43/66  HAND FUNCTION: Grip strength: Right: 20 lbs; Left: 110 lbs, Lateral pinch: Right: 5 lbs, Left: 18 lbs, and 3 point pinch: Right: unable lbs, Left: 16 lbs  COORDINATION: 9 Hole Peg Test:  Eval: Pt. Is unable to grasp/remove one vertical 9-Hole peg  SENSATION: Light touch: WFL Proprioception: WFL  EDEMA: N/A  MUSCLE TONE:  Flexor tone in the right hand  COGNITION: Overall cognitive status: Within functional limits for tasks assessed  VISION: Subjective report:  Requires readers now  PERCEPTION: Intact  PRAXIS:impaired                                                                                                                      TREATMENT DATE: 11/09/24  Moist heat placed to R shoulder for pain management/muscle relaxation, simultaneous to activities noted below.  Self Care: -Condition management education: Advised pt a nurse navigator would reach out to him once pt's demographics are sent to [employer] rep.  Office sent demographics as requested.   -Practiced fork and knife skills with built up foam grip over knife in R hand.  Min A for prehension set up.  Practiced cutting and spreading with theraputty using knife in R hand.  Built up foam grip placed over fork to practice stabbing putty and completing hand to mouth patterns to simulate self feeding with R dominant hand.  Min A for prehension set up.   -Encouraged pt practice increased reps of self feeding finger foods at home using R hand, spreading condiments with knife in R hand, cutting skills as noted above, and larger bites of food using fork in R hand with built up utensil (which pt does have in the home).    Therapeutic Activity: -Facilitated forward and lateral reaching patterns and 2 and 3 point pinching against light velcro resistance, moving velcro blocks between velcro board and table top.  Completed this with Metaflex glove donned to R hand to provide active assist for digit ext to R hand digits for more efficient grasp/release with blocks.  PATIENT EDUCATION: Education details: academic librarian with built up grip Person educated: Patient Education method: Solicitor, and Verbal cues Education comprehension: verbalized understanding, returned demonstration, verbal cues required, tactile cues required, and needs further education  HOME EXERCISE PROGRAM: R handed grasp/release task practice using Metaflex compression glove  GOALS: Goals reviewed with patient? Yes  SHORT TERM GOALS: Target date: 11/28/2024    Pt. Will be independent with HEP for RUE  strength, and coordination skills. Baseline: Eval: No current HEP Goal status: INITIAL   LONG TERM GOALS: Target date: 01/09/2025   Pt. Will increase RUE strength by 2 muscle grades to assist with ADLs, and IADLs. Baseline: Eval:  RUE: shoulder flexion: 4-/5, abduction: 4-/5, elbow flexion: 4-/5, extension: 4-/5, wrist extension 4-/5, forearm supination: 3-/5 Goal status: INITIAL  2.  Pt. Will improve improve right digit extension to be able to independently assume a position of weightbearing, and proprioception through a flat hand. Baseline: Eval: Pt. Is initiating gross digit extension, however requires the assist of the left hand to assume a position of weightbearing. Goal status: INITIAL  3.  Pt. Will actively grasp and release 1 ADL objects efficiently 100% of the time with the right hand. Baseline: Eval: Pt. Is able to grasp  2 container, however is unable to actively release it from his hand Goal status: INITIAL  4.  Pt. Will increase right grip strength by 5# to be able to securely hold items of ADLs Baseline: Eval: Grip strength: Right: 20#, Left: 110  Goal status: INITIAL  5.  Pt. Will increase right lateral pinch strength by 2# to be able to hold, and efficiently use utensils Baseline: Eval:Lateral Pinch: Right: 5#, Left: 18# Pt. has difficulty efficiently holding, and using utensils. Goal status: INITIAL  6.  Pt. Will identify/demonstrate adaptive/modifcations for ADLs/IADLs. Baseline: Eval: Education to be provided Goal status: INITIAL  ASSESSMENT:  CLINICAL IMPRESSION: Pt in agreement to have [employer] nurse navigator call him for a [employer] Q&A.   Pt continues to benefit from use of Metaflex glove for more efficient grasp/release task practice.  Therapist assist with occasional tension adjustments on glove to sufficiently flex/ext digits for grasp/release, specifically with R thumb, IF, and SF digit ext, and vc for intermittent passive wrist and digit ext stretching  after high reps of grasping in order to minimize flexor tone.  Pt pleased with his ability to use fork and knife with built up grip today to simulate self feeding with R dominant hand.  Pt has built up utensils at home, but reports he's just gotten into  the habit of making foods he doesn't have to cut, and he compensates with the L hand out of habit.  Pt receptive to begin practice with fork/knife skills at home with increased engagement of the R hand as noted above.  Pt will continue to benefit from OT services to work towards improving dominant RUE functioning in order increase engagement in, and maximize independence with ADLs, and IADLs.  [employer] Checklist Stroke Chronicity: >6 months post ischemic stroke  Affected Side: R upper extremity  Hand Dominance: R handed prior to CVA; currently uses L hand a majority of the time due to deficits with R dominant side.  Fugl-Meyer Score (UE):  43/66 (LEAPS Version) indicating moderate to severe impairment  ADL/IADL Status: Pt demonstrates functional deficits with RUE ROM, coordination, and strength that negatively affects occupational performance and overall level of independence.    Currently, this pt is requiring increased time and compensation with use of non-dominant LUE to complete daily tasks (see Objective section for further details).  Spasticity: Spasticity is sufficiently managed with passive stretching to participate in high repetition therapy.  General Therapy History: Pt has been seen for RUE intervention in the outpatient setting(s) to address functional deficits following CVA.   Rehab Potential:  The patient is motivated to perform intensive outpatient occupational therapy.      PERFORMANCE DEFICITS: in functional skills including ADLs, IADLs, coordination, dexterity, ROM, strength, pain, Fine motor control, Gross motor control, and UE functional use, cognitive skills including, and psychosocial skills including coping strategies,  environmental adaptation, and routines and behaviors.   IMPAIRMENTS: are limiting patient from ADLs, IADLs, and leisure.   CO-MORBIDITIES: may have co-morbidities  that affects occupational performance. Patient will benefit from skilled OT to address above impairments and improve overall function.  MODIFICATION OR ASSISTANCE TO COMPLETE EVALUATION: Min-Moderate modification of tasks or assist with assess necessary to complete an evaluation.  OT OCCUPATIONAL PROFILE AND HISTORY: Detailed assessment: Review of records and additional review of physical, cognitive, psychosocial history related to current functional performance.  CLINICAL DECISION MAKING: Moderate - several treatment options, min-mod task modification necessary  REHAB POTENTIAL: Good  EVALUATION COMPLEXITY: Moderate    PLAN:  OT FREQUENCY: 1-2x/week  OT DURATION: 12 weeks  PLANNED INTERVENTIONS: 97168 OT Re-evaluation, 97535 self care/ADL training, 02889 therapeutic exercise, 97530 therapeutic activity, 97112 neuromuscular re-education, 97140 manual therapy, 97018 paraffin, 02989 moist heat, 97034 contrast bath, 97760 Orthotic Initial, 97763 Orthotic/Prosthetic subsequent, energy conservation, patient/family education, and DME and/or AE instructions  RECOMMENDED OTHER SERVICES: N/A  CONSULTED AND AGREED WITH PLAN OF CARE: Patient  PLAN FOR NEXT SESSION: Treatment   Inocente Blazing, MS, OTR/L   11/12/2024, 6:53 PM           "

## 2024-11-14 ENCOUNTER — Ambulatory Visit

## 2024-11-14 DIAGNOSIS — M6281 Muscle weakness (generalized): Secondary | ICD-10-CM | POA: Diagnosis not present

## 2024-11-14 DIAGNOSIS — I63512 Cerebral infarction due to unspecified occlusion or stenosis of left middle cerebral artery: Secondary | ICD-10-CM

## 2024-11-14 DIAGNOSIS — R278 Other lack of coordination: Secondary | ICD-10-CM

## 2024-11-14 NOTE — Therapy (Signed)
 " OUTPATIENT OCCUPATIONAL THERAPY NEURO TREATMENT NOTE  Patient Name: Ricardo Yang MRN: [id number] DOB:1976-09-05, 49 y.o., male Today's Date: 11/14/2024  REFERRING PROVIDER:  Lauraine Jonette BRAVO, PA-C  END OF SESSION:  OT End of Session - 11/14/24 1229     Visit Number 6    Number of Visits 24    Date for Recertification  01/09/25    Authorization Type [employer] [id number] for 24 OT vsts from 12/22-3/16/26 (Eval is included in this visit count d/t tx billed at eval)    Authorization Time Period Reporting period beginning 10/17/24    Authorization - Visit Number 6    Authorization - Number of Visits 24    Progress Note Due on Visit 10    OT Start Time 1120    OT Stop Time 1205    OT Time Calculation (min) 45 min    Equipment Utilized During Treatment none    Activity Tolerance Patient tolerated treatment well    Behavior During Therapy [facility] for tasks assessed/performed         Past Medical History:  Diagnosis Date   Acute CVA (cerebrovascular accident) (HCC) 10/04/2022   Acute ST elevation myocardial infarction (STEMI) involving left anterior descending (LAD) coronary artery (HCC) 08/06/2019   Anxiety 09/2022   After stroke   CAD (coronary artery disease) 10/04/2022   Cerebrovascular accident (CVA) (HCC) 10/07/2022   Chest pain    CHF (congestive heart failure) (HCC)    Cocaine abuse (HCC)    Cocaine use 10/06/2022   Depression December 2023   When stroke happened   Elevated troponin    Encounter for imaging to screen for metal prior to MRI 10/07/2022   Healthcare maintenance 10/07/2023   Heavy smoker    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 question possible MS   NSTEMI (non-ST elevated myocardial infarction) (HCC) 05/25/2015   Obesity    Polysubstance abuse (HCC)    a. tobacco, cocaine, crack, ecstasy, pills, anything but injections   Primary hypertension 10/05/2022   Sleep apnea    Always   STEMI (ST elevation myocardial  infarction) (HCC) 08/06/2019   Past Surgical History:  Procedure Laterality Date   CARDIAC CATHETERIZATION N/A 05/28/2015   Procedure: Left Heart Cath and Coronary Angiography;  Surgeon: Evalene JINNY Lunger, MD;  Location: [facility] INVASIVE CV LAB;  Service: Cardiovascular;  Laterality: N/A;   CORONARY ANGIOGRAPHY N/A 04/22/2021   Procedure: CORONARY ANGIOGRAPHY;  Surgeon: Ammon Blunt, MD;  Location: [facility] INVASIVE CV LAB;  Service: Cardiovascular;  Laterality: N/A;   CORONARY/GRAFT ACUTE MI REVASCULARIZATION N/A 08/06/2019   Procedure: Coronary/Graft Acute MI Revascularization;  Surgeon: Ammon Blunt, MD;  Location: [facility] INVASIVE CV LAB;  Service: Cardiovascular;  Laterality: N/A;   LEFT HEART CATH N/A 04/22/2021   Procedure: Left Heart Cath;  Surgeon: Ammon Blunt, MD;  Location: [facility] INVASIVE CV LAB;  Service: Cardiovascular;  Laterality: N/A;   LEFT HEART CATH AND CORONARY ANGIOGRAPHY N/A 08/06/2019   Procedure: LEFT HEART CATH AND CORONARY ANGIOGRAPHY;  Surgeon: Ammon Blunt, MD;  Location: [facility] INVASIVE CV LAB;  Service: Cardiovascular;  Laterality: N/A;   TEE WITHOUT CARDIOVERSION N/A 10/08/2022   Procedure: TRANSESOPHAGEAL ECHOCARDIOGRAM (TEE);  Surgeon: Darliss Rogue, MD;  Location: [facility] ORS;  Service: Cardiovascular;  Laterality: N/A;  11:30am   Patient Active Problem List   Diagnosis Date Noted   Chronic musculoskeletal pain 07/25/2024   Depression 05/25/2024   Biceps tendinitis, right 11/03/2023   Healthcare maintenance  10/07/2023   Sleep apnea 01/01/2023   Dysfunction of right rotator cuff 01/01/2023   History of CVA with residual deficit 12/04/2022   Mixed hyperlipidemia 12/04/2022   History of MI (myocardial infarction) 12/04/2022   Insomnia 10/17/2022   Slow transit constipation 10/17/2022   Osteoarthritis of right knee 10/17/2022   History of substance abuse (HCC) 10/15/2022   HFrEF (heart failure with reduced ejection fraction) (HCC)  10/07/2022   Expressive aphasia 10/05/2022   Right hemiplegia (HCC) 10/05/2022   CAD S/P percutaneous coronary angioplasty 10/05/2022   Polysubstance abuse (HCC) 10/04/2022   Essential hypertension 04/22/2021   Ischemic chest pain    ONSET DATE: 09/2022  REFERRING DIAG: CVA  THERAPY DIAG:  Muscle weakness (generalized)  Other lack of coordination  Left middle cerebral artery stroke (HCC)  Rationale for Evaluation and Treatment: Rehabilitation  SUBJECTIVE:   SUBJECTIVE STATEMENT: Pt reports he has to ask a family member to clip his nails.  Pt reports it's been about a year since he was able to attempt this himself. Pt accompanied by: self  PERTINENT HISTORY:  Pt. is a 49 y.o. male who has a history of an ischemic stroke in December of 2023 with  residual right hemiparesis and dysphasia. PMHx includes: arthritis-for which he has finished radiation treatments on his knees via radiation oncology services, major depressive disorder, shoulder ostearthritis, and heart failure with an EF of 25%.  PRECAUTIONS:  None  WEIGHT BEARING RESTRICTIONS: No  PAIN: 11/14/24: 1/10 R shoulder; improves with moist heat Are you having pain? Arthritic pain in the right shoulder 1-2/10 during the eval  FALLS: Has patient fallen in last 6 months? No  LIVING ENVIRONMENT: Lives with: Resides alone Lives in: House/apartment-Resides in a [id number]: one step  Has following equipment at home: Grab bars  PLOF: Independent  PATIENT GOALS:  Regain some everyday use with his right hand.  OBJECTIVE:  Note: Objective measures were completed at Evaluation unless otherwise noted.  HAND DOMINANCE: Right  ADLs:  Transfers/ambulation related to ADLs: Independent Eating: Uses the left hand,; tries to engage his right hand Grooming: holds the tooth brush with the right hand. UB Dressing: Uses his left hand using one armed dressing techniques LB Dressing: Uses the left hand; Pt. reports that he  manages to get it done. Toileting:  Pt. Uses his nondominant left hand Bathing:  Engages the right  hand to wash the left axilla region. Tub Shower transfers: Uses a shower chair Equipment:  grab bars shower, shower seat, cane  IADLs: Shopping: Uses the left hand Light housekeeping: Independent Meal Prep: Does not use dishes-uses paperware, plastic cups, and utensils. Community mobility:  Driving Medication management: Independent-stabilizes the bottle with the right hand, and uses the left hand to open Financial management: Reports having to plan things out more 2/2 with the right hand, and bilateral knee issues. Handwriting: 50% legible with left nondominant hand Typing: Is unable to type with the right hand-uses left hand Cellphone use: uses left hand- slower/more awkward Leisure/Hobbies: Video games Work History: professional Paediatric Nurse; cook  MOBILITY STATUS: Independent  FUNCTIONAL OUTCOME MEASURES: MAM-20: Eval: TBD   10/31/24: MAM-20 for musculoskeletal conditions: 10/26/24 (eval): 35/80 1= cannot do, 2=Very hard to do, 3=A little hard to do, 4=Easy to do Rating Choose only one number (definitions above)  1 Cut nails with a nail clipper  1 2. Tie shoes with laces  1 3. Cut meat on a plate   2 4. Wring a towel  3 5.  Open a medicine bottle with a child proof cap/top   1 6. Zip a jacket  1 7. Button clothes (medium sized buttons)  1 8. Write 3-4 lines legibly  2 9. Take things/cards out of a wallet   3 10. Open a wide-mouth jar or bottle previously opened   2 11. Handle/count monty (bills and coins)  3 12. Pick up a 1/2 full water pitcher  1 13. Turn key (to open a door)  1 14. Squeeze toothpaste onto a toothbrush  3 15. Use spoon or fork  2 16. Brush or comb hair  2 17. Dial or key in telephone numbers  1 18. Brush teeth   3 19. Wash hands  1 20. Use hand(s) to eat a sandwich   35/80       UPPER EXTREMITY ROM:    Active ROM Right Eval WFL Left Eval [facility]   Shoulder flexion    Shoulder abduction    Shoulder adduction    Shoulder extension    Shoulder internal rotation    Shoulder external rotation    Elbow flexion    Elbow extension    Wrist flexion    Wrist extension    Wrist ulnar deviation    Wrist radial deviation    Wrist pronation    Wrist supination    (Blank rows = not tested)  Active ROM Right Eval  Flex/ext Left Eval WFL  Thumb MCP (0-60)    Thumb IP (0-80) 55/35   Thumb Radial abd/add (0-55)     Thumb Palmar abd/add (0-45)     Thumb Opposition to Small Finger     Index MCP (0-90) Full/0    Index PIP (0-100) Full/40    Index DIP (0-70) Full/25     Long MCP (0-90)  Full/20    Long PIP (0-100) Full/25     Long DIP (0-70)  Full/15    Ring MCP (0-90) Full/25     Ring PIP (0-100) Full/20     Ring DIP (0-70)  Full/10    Little MCP (0-90)  Full/55    Little PIP (0-100)  Full/10    Little DIP (0-70) Full/0     (Blank rows = not tested)   10/17/2024: Pt. Is able to formulate a full composite fist with the right hand  UPPER EXTREMITY MMT:     MMT Right Eval  Left eval  Shoulder flexion 4- 5  Shoulder abduction 4- 5  Shoulder adduction    Shoulder extension    Shoulder internal rotation    Shoulder external rotation    Middle trapezius    Lower trapezius    Elbow flexion 4- 5  Elbow extension 4- 5  Wrist flexion    Wrist extension 4- 5  Wrist ulnar deviation    Wrist radial deviation    Wrist pronation    Wrist supination 3- 5  (Blank rows = not tested)  FUGL-MEYER ASSESSMENT   A. Upper Extremity   I. Reflex Activity:   11/07/24   Flexors (biceps) 2   Extensors (triceps) 2   Total 4/4                 II. Volitional Movement within Synergies:   11/07/24   Flexor Synergy:      Shoulder Elevation 1   Shoulder Retraction 2   Shoulder Abduction (at least 90*) 2   External Rotation 1   Elbow Flexion 2   Forearm Supination 1   Extensor Synergy:  Shoulder Adduction/Internal Rotation 2    Elbow Extension 2   Forearm Pronation 2   Total 15/18     III. Movement Combining Synergies:      Hand to Lumbar Spine 2   Shoulder Flexion at 90*, elbow at 0* 1   Pron/Sup w/elbow at 90* and shoulder at 0* 1 (lacking full supination)   Total 4/6     IV. Movement Out of Synergy      Shoulder Abduction to 90*, elbow at 0*, forearm pronated 2   Shoulder Flexion 90-180*, elbow at 0*, forearm in mid position 2 (limited by R shoulder pain, but forearm slightly over pronates/IR   Pron/sup, elbow at 0* and shoulder between 30-90* of flexion 1 (elbow flexes slightly and limited supination)   Total 5/6       B. Wrist      Stability of wrist at 15* ext, elbow at 90*, shoulder 0* 2   Repeated flex/ext, elbow at 90*, shoulder 0* 1   Stability of wrist at 15* ext, elbow at 0*, shoulder 30* 2   Flex/ext, elbow 0*, shoulder 30* 1   Circumduction  1   Total 7/10     C. Hand      Mass Flexion 1 (limited DIP flexion)    Mass Extension 1   Total 2/4       D. Grasp      Hook  0   Thumb Adduction - paper 1   Pincer - pen 0   Cylindrical - cup/can 2   Spherical - tennis ball 0 (2nd digit does not wrap around ball but does flex)   Total 3/10     E. Coordination/Speed      Tremor 1   Dysmetria 1   Time L 3.38, R 6.92 1   Total 3/6     FUGL-MEYER ASSESSMENT: UPPER EXTREMITY A-E TOTAL on (date): 43/66  HAND FUNCTION: Grip strength: Right: 20 lbs; Left: 110 lbs, Lateral pinch: Right: 5 lbs, Left: 18 lbs, and 3 point pinch: Right: unable lbs, Left: 16 lbs  COORDINATION: 9 Hole Peg Test:  Eval: Pt. Is unable to grasp/remove one vertical 9-Hole peg  SENSATION: Light touch: WFL Proprioception: WFL  EDEMA: N/A  MUSCLE TONE:  Flexor tone in the right hand  COGNITION: Overall cognitive status: Within functional limits for tasks assessed  VISION: Subjective report:  Requires readers now  PERCEPTION: Intact  PRAXIS:impaired                                                                                                                      TREATMENT DATE: 11/14/24  Moist heat placed to R shoulder for pain management/muscle relaxation, simultaneous to activities noted below.  Self Care: -Nail care: Trial of adapted nail clippers secured to a base for trimming L hand nails, and removed from base to clip R hand nails.  Trial of WB strategies in sitting and standing to reduce flexor tone in R hand digits, and added oval  8 splint size 12 for further assist to maintain PIP extension.  Pt alternated use of splint between thumb, IF, and LF to ease clipping.  -Issued built up foam grip and placed over nail file for pt to independently file nails after use of nail clippers.    Therapeutic Activity: -K-tape applied to R hand/forearm for flexor tone reduction for dorsal thumb, IF, and SF, anchoring at the base of each nail bed, extending to mid dorsal forearm on ~75% stretch.  Further reinforced tape to prevent peeling with circumferential taping with Ktape (0% stretch) around distal anchored portion of IF and SF, and Ktape and coban around wrist and forearm to prevent peeling.    PATIENT EDUCATION: Education details: K-tape: benefits of use, expected wearing time, indications for removing, application techniques; oval 8 splints: indications for donning/doffing, with recommendation to doff splints frequently throughout the day to reduce stiffness and allow attempts at active digit ext Person educated: Patient Education method: Explanation, Demonstration, and Verbal cues Education comprehension: verbalized understanding, returned demonstration, verbal cues required, tactile cues required, and needs further education  HOME EXERCISE PROGRAM: R handed grasp/release task practice using Metaflex compression glove  GOALS: Goals reviewed with patient? Yes  SHORT TERM GOALS: Target date: 11/28/2024    Pt. Will be independent with HEP for RUE strength, and coordination  skills. Baseline: Eval: No current HEP Goal status: INITIAL   LONG TERM GOALS: Target date: 01/09/2025   Pt. Will increase RUE strength by 2 muscle grades to assist with ADLs, and IADLs. Baseline: Eval:  RUE: shoulder flexion: 4-/5, abduction: 4-/5, elbow flexion: 4-/5, extension: 4-/5, wrist extension 4-/5, forearm supination: 3-/5 Goal status: INITIAL  2.  Pt. Will improve improve right digit extension to be able to independently assume a position of weightbearing, and proprioception through a flat hand. Baseline: Eval: Pt. Is initiating gross digit extension, however requires the assist of the left hand to assume a position of weightbearing. Goal status: INITIAL  3.  Pt. Will actively grasp and release 1 ADL objects efficiently 100% of the time with the right hand. Baseline: Eval: Pt. Is able to grasp  2 container, however is unable to actively release it from his hand Goal status: INITIAL  4.  Pt. Will increase right grip strength by 5# to be able to securely hold items of ADLs Baseline: Eval: Grip strength: Right: 20#, Left: 110  Goal status: INITIAL  5.  Pt. Will increase right lateral pinch strength by 2# to be able to hold, and efficiently use utensils Baseline: Eval:Lateral Pinch: Right: 5#, Left: 18# Pt. has difficulty efficiently holding, and using utensils. Goal status: INITIAL  6.  Pt. Will identify/demonstrate adaptive/modifcations for ADLs/IADLs. Baseline: Eval: Education to be provided Goal status: INITIAL  ASSESSMENT:  CLINICAL IMPRESSION: Pt able to clip and file nails today with vc and tactile cues only, with recommendations for positioning strategies, donning oval 8 splint, and use of adaptive grooming aids noted above.  Pt was advised on options to obtain a variety of adapted nail clippers and/or strategies for anchoring clippers to a block.  Pt was issued built up foam grip for nail file at home, and 2 oval 8 splints size 12 for nail care and grasp/release  task practice.  Pt verbalized understanding of all education provided and felt Ktape and oval 8 splints already seem helpful for achieving necessary digit extension for a variety of functional activities.  Pt will continue to benefit from OT services to work towards improving dominant RUE functioning  in order increase engagement in, and maximize independence with ADLs, and IADLs.  Vivistim Checklist Stroke Chronicity: >6 months post ischemic stroke  Affected Side: R upper extremity  Hand Dominance: R handed prior to CVA; currently uses L hand a majority of the time due to deficits with R dominant side.  Fugl-Meyer Score (UE):  43/66 (LEAPS Version) indicating moderate to severe impairment  ADL/IADL Status: Pt demonstrates functional deficits with RUE ROM, coordination, and strength that negatively affects occupational performance and overall level of independence.    Currently, this pt is requiring increased time and compensation with use of non-dominant LUE to complete daily tasks (see Objective section for further details).  Spasticity: Spasticity is sufficiently managed with passive stretching to participate in high repetition therapy.  General Therapy History: Pt has been seen for RUE intervention in the outpatient setting(s) to address functional deficits following CVA.   Rehab Potential:  The patient is motivated to perform intensive outpatient occupational therapy.      PERFORMANCE DEFICITS: in functional skills including ADLs, IADLs, coordination, dexterity, ROM, strength, pain, Fine motor control, Gross motor control, and UE functional use, cognitive skills including, and psychosocial skills including coping strategies, environmental adaptation, and routines and behaviors.   IMPAIRMENTS: are limiting patient from ADLs, IADLs, and leisure.   CO-MORBIDITIES: may have co-morbidities  that affects occupational performance. Patient will benefit from skilled OT to address above impairments and  improve overall function.  MODIFICATION OR ASSISTANCE TO COMPLETE EVALUATION: Min-Moderate modification of tasks or assist with assess necessary to complete an evaluation.  OT OCCUPATIONAL PROFILE AND HISTORY: Detailed assessment: Review of records and additional review of physical, cognitive, psychosocial history related to current functional performance.  CLINICAL DECISION MAKING: Moderate - several treatment options, min-mod task modification necessary  REHAB POTENTIAL: Good  EVALUATION COMPLEXITY: Moderate    PLAN:  OT FREQUENCY: 1-2x/week  OT DURATION: 12 weeks  PLANNED INTERVENTIONS: 97168 OT Re-evaluation, 97535 self care/ADL training, 02889 therapeutic exercise, 97530 therapeutic activity, 97112 neuromuscular re-education, 97140 manual therapy, 97018 paraffin, 02989 moist heat, 97034 contrast bath, 97760 Orthotic Initial, 97763 Orthotic/Prosthetic subsequent, energy conservation, patient/family education, and DME and/or AE instructions  RECOMMENDED OTHER SERVICES: N/A  CONSULTED AND AGREED WITH PLAN OF CARE: Patient  PLAN FOR NEXT SESSION: Treatment   Inocente Blazing, MS, OTR/L   11/14/2024, 12:31 PM           "

## 2024-11-15 ENCOUNTER — Telehealth: Payer: Self-pay | Admitting: Pharmacist

## 2024-11-15 NOTE — Telephone Encounter (Signed)
 I spoke with patient's mom. Patient will need to sign the appeals letter. He has an apt tomorrow with OT. He will stop by the office to sign. 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 He cannot cook because he cannot use his right arm due to his stroke. Him mom fixes him food and brings it to him but he can't store that many fresh vegetables/fruits.  He walks and does OT for exercise. Due to his stroke that's about all he can do. Once he signs the appeals letter, I will ask the [facility] staff to email to me and then I will submit it with our appeals letter.

## 2024-11-16 ENCOUNTER — Ambulatory Visit

## 2024-11-16 DIAGNOSIS — M6281 Muscle weakness (generalized): Secondary | ICD-10-CM

## 2024-11-16 DIAGNOSIS — I63512 Cerebral infarction due to unspecified occlusion or stenosis of left middle cerebral artery: Secondary | ICD-10-CM

## 2024-11-16 DIAGNOSIS — R278 Other lack of coordination: Secondary | ICD-10-CM

## 2024-11-16 NOTE — Telephone Encounter (Signed)
 Paperwork has been placed in signature box at front desk on 11/15/2024.  Per Eleanor Crews, PharmD, pt will be coming by to sign appropriate marked page and then we are to email the form back to her and she will complete the appeal.

## 2024-11-16 NOTE — Telephone Encounter (Signed)
 Patient signed form Emailed to [employer]

## 2024-11-17 ENCOUNTER — Ambulatory Visit: Admitting: Occupational Therapy

## 2024-11-18 NOTE — Therapy (Signed)
 " OUTPATIENT OCCUPATIONAL THERAPY NEURO TREATMENT NOTE  Patient Name: Ricardo Yang MRN: [id number] DOB:1975-11-03, 49 y.o., male Today's Date: 11/18/2024  REFERRING PROVIDER:  Lauraine Jonette BRAVO, PA-C  END OF SESSION:  OT End of Session - 11/18/24 1631     Visit Number 7    Number of Visits 24    Date for Recertification  01/09/25    Authorization Type [employer] [id number] for 24 OT vsts from 12/22-3/16/26 (Eval is included in this visit count d/t tx billed at eval)    Authorization Time Period Reporting period beginning 10/17/24    Authorization - Visit Number 7    Authorization - Number of Visits 24    Progress Note Due on Visit 10    OT Start Time 1145    OT Stop Time 1230    OT Time Calculation (min) 45 min    Equipment Utilized During Treatment none    Activity Tolerance Patient tolerated treatment well    Behavior During Therapy [facility] for tasks assessed/performed         Past Medical History:  Diagnosis Date   Acute CVA (cerebrovascular accident) (HCC) 10/04/2022   Acute ST elevation myocardial infarction (STEMI) involving left anterior descending (LAD) coronary artery (HCC) 08/06/2019   Anxiety 09/2022   After stroke   CAD (coronary artery disease) 10/04/2022   Cerebrovascular accident (CVA) (HCC) 10/07/2022   Chest pain    CHF (congestive heart failure) (HCC)    Cocaine abuse (HCC)    Cocaine use 10/06/2022   Depression December 2023   When stroke happened   Elevated troponin    Encounter for imaging to screen for metal prior to MRI 10/07/2022   Healthcare maintenance 10/07/2023   Heavy smoker    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 question possible MS   NSTEMI (non-ST elevated myocardial infarction) (HCC) 05/25/2015   Obesity    Polysubstance abuse (HCC)    a. tobacco, cocaine, crack, ecstasy, pills, anything but injections   Primary hypertension 10/05/2022   Sleep apnea    Always   STEMI (ST elevation myocardial  infarction) (HCC) 08/06/2019   Past Surgical History:  Procedure Laterality Date   CARDIAC CATHETERIZATION N/A 05/28/2015   Procedure: Left Heart Cath and Coronary Angiography;  Surgeon: Evalene JINNY Lunger, MD;  Location: [facility] INVASIVE CV LAB;  Service: Cardiovascular;  Laterality: N/A;   CORONARY ANGIOGRAPHY N/A 04/22/2021   Procedure: CORONARY ANGIOGRAPHY;  Surgeon: Ammon Blunt, MD;  Location: [facility] INVASIVE CV LAB;  Service: Cardiovascular;  Laterality: N/A;   CORONARY/GRAFT ACUTE MI REVASCULARIZATION N/A 08/06/2019   Procedure: Coronary/Graft Acute MI Revascularization;  Surgeon: Ammon Blunt, MD;  Location: [facility] INVASIVE CV LAB;  Service: Cardiovascular;  Laterality: N/A;   LEFT HEART CATH N/A 04/22/2021   Procedure: Left Heart Cath;  Surgeon: Ammon Blunt, MD;  Location: [facility] INVASIVE CV LAB;  Service: Cardiovascular;  Laterality: N/A;   LEFT HEART CATH AND CORONARY ANGIOGRAPHY N/A 08/06/2019   Procedure: LEFT HEART CATH AND CORONARY ANGIOGRAPHY;  Surgeon: Ammon Blunt, MD;  Location: [facility] INVASIVE CV LAB;  Service: Cardiovascular;  Laterality: N/A;   TEE WITHOUT CARDIOVERSION N/A 10/08/2022   Procedure: TRANSESOPHAGEAL ECHOCARDIOGRAM (TEE);  Surgeon: Darliss Rogue, MD;  Location: [facility] ORS;  Service: Cardiovascular;  Laterality: N/A;  11:30am   Patient Active Problem List   Diagnosis Date Noted   Chronic musculoskeletal pain 07/25/2024   Depression 05/25/2024   Biceps tendinitis, right 11/03/2023   Healthcare maintenance  10/07/2023   Sleep apnea 01/01/2023   Dysfunction of right rotator cuff 01/01/2023   History of CVA with residual deficit 12/04/2022   Mixed hyperlipidemia 12/04/2022   History of MI (myocardial infarction) 12/04/2022   Insomnia 10/17/2022   Slow transit constipation 10/17/2022   Osteoarthritis of right knee 10/17/2022   History of substance abuse (HCC) 10/15/2022   HFrEF (heart failure with reduced ejection fraction) (HCC)  10/07/2022   Expressive aphasia 10/05/2022   Right hemiplegia (HCC) 10/05/2022   CAD S/P percutaneous coronary angioplasty 10/05/2022   Polysubstance abuse (HCC) 10/04/2022   Essential hypertension 04/22/2021   Ischemic chest pain    ONSET DATE: 09/2022  REFERRING DIAG: CVA  THERAPY DIAG:  Muscle weakness (generalized)  Other lack of coordination  Left middle cerebral artery stroke (HCC)  Rationale for Evaluation and Treatment: Rehabilitation  SUBJECTIVE:   SUBJECTIVE STATEMENT: Pt reports doing well today. Pt accompanied by: self  PERTINENT HISTORY:  Pt. is a 49 y.o. male who has a history of an ischemic stroke in December of 2023 with  residual right hemiparesis and dysphasia. PMHx includes: arthritis-for which he has finished radiation treatments on his knees via radiation oncology services, major depressive disorder, shoulder ostearthritis, and heart failure with an EF of 25%.  PRECAUTIONS:  None  WEIGHT BEARING RESTRICTIONS: No  PAIN: 11/17/24: 1/10 R shoulder Are you having pain? Arthritic pain in the right shoulder 1-2/10 during the eval  FALLS: Has patient fallen in last 6 months? No  LIVING ENVIRONMENT: Lives with: Resides alone Lives in: House/apartment-Resides in a [address]: one step  Has following equipment at home: Grab bars  PLOF: Independent  PATIENT GOALS:  Regain some everyday use with his right hand.  OBJECTIVE:  Note: Objective measures were completed at Evaluation unless otherwise noted.  HAND DOMINANCE: Right  ADLs:  Transfers/ambulation related to ADLs: Independent Eating: Uses the left hand,; tries to engage his right hand Grooming: holds the tooth brush with the right hand. UB Dressing: Uses his left hand using one armed dressing techniques LB Dressing: Uses the left hand; Pt. reports that he manages to get it done. Toileting:  Pt. Uses his nondominant left hand Bathing:  Engages the right  hand to wash the left axilla  region. Tub Shower transfers: Uses a shower chair Equipment:  grab bars shower, shower seat, cane  IADLs: Shopping: Uses the left hand Light housekeeping: Independent Meal Prep: Does not use dishes-uses paperware, plastic cups, and utensils. Community mobility:  Driving Medication management: Independent-stabilizes the bottle with the right hand, and uses the left hand to open Financial management: Reports having to plan things out more 2/2 with the right hand, and bilateral knee issues. Handwriting: 50% legible with left nondominant hand Typing: Is unable to type with the right hand-uses left hand Cellphone use: uses left hand- slower/more awkward Leisure/Hobbies: Video games Work History: professional Paediatric Nurse; cook  MOBILITY STATUS: Independent  FUNCTIONAL OUTCOME MEASURES: MAM-20: Eval: TBD   10/31/24: MAM-20 for musculoskeletal conditions: 10/26/24 (eval): 35/80 1= cannot do, 2=Very hard to do, 3=A little hard to do, 4=Easy to do Rating Choose only one number (definitions above)  1 Cut nails with a nail clipper  1 2. Tie shoes with laces  1 3. Cut meat on a plate   2 4. Wring a towel  3 5. Open a medicine bottle with a child proof cap/top   1 6. Zip a jacket  1 7. Button clothes (medium sized buttons)  1 8. Write  3-4 lines legibly  2 9. Take things/cards out of a wallet   3 10. Open a wide-mouth jar or bottle previously opened   2 11. Handle/count monty (bills and coins)  3 12. Pick up a 1/2 full water pitcher  1 13. Turn key (to open a door)  1 14. Squeeze toothpaste onto a toothbrush  3 15. Use spoon or fork  2 16. Brush or comb hair  2 17. Dial or key in telephone numbers  1 18. Brush teeth   3 19. Wash hands  1 20. Use hand(s) to eat a sandwich   35/80       UPPER EXTREMITY ROM:    Active ROM Right Eval WFL Left Eval [facility]  Shoulder flexion    Shoulder abduction    Shoulder adduction    Shoulder extension    Shoulder internal rotation    Shoulder  external rotation    Elbow flexion    Elbow extension    Wrist flexion    Wrist extension    Wrist ulnar deviation    Wrist radial deviation    Wrist pronation    Wrist supination    (Blank rows = not tested)  Active ROM Right Eval  Flex/ext Left Eval WFL  Thumb MCP (0-60)    Thumb IP (0-80) 55/35   Thumb Radial abd/add (0-55)     Thumb Palmar abd/add (0-45)     Thumb Opposition to Small Finger     Index MCP (0-90) Full/0    Index PIP (0-100) Full/40    Index DIP (0-70) Full/25     Long MCP (0-90)  Full/20    Long PIP (0-100) Full/25     Long DIP (0-70)  Full/15    Ring MCP (0-90) Full/25     Ring PIP (0-100) Full/20     Ring DIP (0-70)  Full/10    Little MCP (0-90)  Full/55    Little PIP (0-100)  Full/10    Little DIP (0-70) Full/0     (Blank rows = not tested)   10/17/2024: Pt. Is able to formulate a full composite fist with the right hand  UPPER EXTREMITY MMT:     MMT Right Eval  Left eval  Shoulder flexion 4- 5  Shoulder abduction 4- 5  Shoulder adduction    Shoulder extension    Shoulder internal rotation    Shoulder external rotation    Middle trapezius    Lower trapezius    Elbow flexion 4- 5  Elbow extension 4- 5  Wrist flexion    Wrist extension 4- 5  Wrist ulnar deviation    Wrist radial deviation    Wrist pronation    Wrist supination 3- 5  (Blank rows = not tested)  FUGL-MEYER ASSESSMENT   A. Upper Extremity   I. Reflex Activity:   11/07/24   Flexors (biceps) 2   Extensors (triceps) 2   Total 4/4                 II. Volitional Movement within Synergies:   11/07/24   Flexor Synergy:      Shoulder Elevation 1   Shoulder Retraction 2   Shoulder Abduction (at least 90*) 2   External Rotation 1   Elbow Flexion 2   Forearm Supination 1   Extensor Synergy:      Shoulder Adduction/Internal Rotation 2   Elbow Extension 2   Forearm Pronation 2   Total 15/18     III.  Movement Combining Synergies:      Hand to Lumbar Spine 2    Shoulder Flexion at 90*, elbow at 0* 1   Pron/Sup w/elbow at 90* and shoulder at 0* 1 (lacking full supination)   Total 4/6     IV. Movement Out of Synergy      Shoulder Abduction to 90*, elbow at 0*, forearm pronated 2   Shoulder Flexion 90-180*, elbow at 0*, forearm in mid position 2 (limited by R shoulder pain, but forearm slightly over pronates/IR   Pron/sup, elbow at 0* and shoulder between 30-90* of flexion 1 (elbow flexes slightly and limited supination)   Total 5/6       B. Wrist      Stability of wrist at 15* ext, elbow at 90*, shoulder 0* 2   Repeated flex/ext, elbow at 90*, shoulder 0* 1   Stability of wrist at 15* ext, elbow at 0*, shoulder 30* 2   Flex/ext, elbow 0*, shoulder 30* 1   Circumduction  1   Total 7/10     C. Hand      Mass Flexion 1 (limited DIP flexion)    Mass Extension 1   Total 2/4       D. Grasp      Hook  0   Thumb Adduction - paper 1   Pincer - pen 0   Cylindrical - cup/can 2   Spherical - tennis ball 0 (2nd digit does not wrap around ball but does flex)   Total 3/10     E. Coordination/Speed      Tremor 1   Dysmetria 1   Time L 3.38, R 6.92 1   Total 3/6     FUGL-MEYER ASSESSMENT: UPPER EXTREMITY A-E TOTAL on (date): 43/66  HAND FUNCTION: Grip strength: Right: 20 lbs; Left: 110 lbs, Lateral pinch: Right: 5 lbs, Left: 18 lbs, and 3 point pinch: Right: unable lbs, Left: 16 lbs  COORDINATION: 9 Hole Peg Test:  Eval: Pt. Is unable to grasp/remove one vertical 9-Hole peg  SENSATION: Light touch: WFL Proprioception: WFL  EDEMA: N/A  MUSCLE TONE:  Flexor tone in the right hand  COGNITION: Overall cognitive status: Within functional limits for tasks assessed  VISION: Subjective report:  Requires readers now  PERCEPTION: Intact  PRAXIS:impaired                                                                                                                     TREATMENT DATE: 11/16/24 Neuro re-ed: -K-tape application:  I strips applied to R hand/forearm for flexor tone reduction and to promote digit ext for thumb, IF, and SF, anchoring at the base of each nail bed, extending to mid dorsal forearm on ~75% stretch -Facilitated tone normalization in the RUE with instruction in weight bearing positions through the hand:  -Flat palm on table top, digits extended, elbow extended; OT provided tactile cues at the elbow and wrist with gentle forward lean to maximize wrist and elbow ext -Flat palm on  wall, with min A to position wrist and digits into ext with pronated forearm, and to reduce radial deviation at the wrist  Therapeutic Activity: -Facilitated R hand grasp/release task practice with formulation of 2 and 3 point pinch patterns against light velcro resistance, moving 1 velcro blocks from board> table top.  Intermittent min vc needed for self passive digit extension stretching or WB through the hand to normalize tone for improved pinch prehension.  PATIENT EDUCATION: Education details:Tone normalization strategies Person educated: Patient Education method: Explanation, Demonstration, Tactile cues, and Verbal cues Education comprehension: verbalized understanding, returned demonstration, verbal cues required, tactile cues required, and needs further education  HOME EXERCISE PROGRAM: R handed grasp/release task practice using Metaflex compression glove  GOALS: Goals reviewed with patient? Yes  SHORT TERM GOALS: Target date: 11/28/2024    Pt. Will be independent with HEP for RUE strength, and coordination skills. Baseline: Eval: No current HEP Goal status: INITIAL   LONG TERM GOALS: Target date: 01/09/2025   Pt. Will increase RUE strength by 2 muscle grades to assist with ADLs, and IADLs. Baseline: Eval:  RUE: shoulder flexion: 4-/5, abduction: 4-/5, elbow flexion: 4-/5, extension: 4-/5, wrist extension 4-/5, forearm supination: 3-/5 Goal status: INITIAL  2.  Pt. Will improve improve right digit  extension to be able to independently assume a position of weightbearing, and proprioception through a flat hand. Baseline: Eval: Pt. Is initiating gross digit extension, however requires the assist of the left hand to assume a position of weightbearing. Goal status: INITIAL  3.  Pt. Will actively grasp and release 1 ADL objects efficiently 100% of the time with the right hand. Baseline: Eval: Pt. Is able to grasp  2 container, however is unable to actively release it from his hand Goal status: INITIAL  4.  Pt. Will increase right grip strength by 5# to be able to securely hold items of ADLs Baseline: Eval: Grip strength: Right: 20#, Left: 110  Goal status: INITIAL  5.  Pt. Will increase right lateral pinch strength by 2# to be able to hold, and efficiently use utensils Baseline: Eval:Lateral Pinch: Right: 5#, Left: 18# Pt. has difficulty efficiently holding, and using utensils. Goal status: INITIAL  6.  Pt. Will identify/demonstrate adaptive/modifcations for ADLs/IADLs. Baseline: Eval: Education to be provided Goal status: INITIAL  ASSESSMENT:  CLINICAL IMPRESSION: Pt found good benefit from Ktape applied last session, and was able to keep tape on for about 2 days.  Ktape applied again today for tone normalization and improved performance of grasp/release task practice.  Initiated WB positions through the R wrist and hand on table top and wall as noted above, with pt acknowledging improvement in muscle relaxation throughout the RUE.  Pt required min A/tc/vc to optimize form within WB positions.  Pt will continue to benefit from OT services to work towards improving dominant RUE functioning in order increase engagement in, and maximize independence with ADLs, and IADLs.  Vivistim Checklist Stroke Chronicity: >6 months post ischemic stroke  Affected Side: R upper extremity  Hand Dominance: R handed prior to CVA; currently uses L hand a majority of the time due to deficits with R dominant  side.  Fugl-Meyer Score (UE):  43/66 (LEAPS Version) indicating moderate to severe impairment  ADL/IADL Status: Pt demonstrates functional deficits with RUE ROM, coordination, and strength that negatively affects occupational performance and overall level of independence.    Currently, this pt is requiring increased time and compensation with use of non-dominant LUE to complete daily tasks (see Objective  section for further details).  Spasticity: Spasticity is sufficiently managed with passive stretching to participate in high repetition therapy.  General Therapy History: Pt has been seen for RUE intervention in the outpatient setting(s) to address functional deficits following CVA.   Rehab Potential:  The patient is motivated to perform intensive outpatient occupational therapy.      PERFORMANCE DEFICITS: in functional skills including ADLs, IADLs, coordination, dexterity, ROM, strength, pain, Fine motor control, Gross motor control, and UE functional use, cognitive skills including, and psychosocial skills including coping strategies, environmental adaptation, and routines and behaviors.   IMPAIRMENTS: are limiting patient from ADLs, IADLs, and leisure.   CO-MORBIDITIES: may have co-morbidities  that affects occupational performance. Patient will benefit from skilled OT to address above impairments and improve overall function.  MODIFICATION OR ASSISTANCE TO COMPLETE EVALUATION: Min-Moderate modification of tasks or assist with assess necessary to complete an evaluation.  OT OCCUPATIONAL PROFILE AND HISTORY: Detailed assessment: Review of records and additional review of physical, cognitive, psychosocial history related to current functional performance.  CLINICAL DECISION MAKING: Moderate - several treatment options, min-mod task modification necessary  REHAB POTENTIAL: Good  EVALUATION COMPLEXITY: Moderate    PLAN:  OT FREQUENCY: 1-2x/week  OT DURATION: 12 weeks  PLANNED  INTERVENTIONS: 97168 OT Re-evaluation, 97535 self care/ADL training, 02889 therapeutic exercise, 97530 therapeutic activity, 97112 neuromuscular re-education, 97140 manual therapy, 97018 paraffin, 02989 moist heat, 97034 contrast bath, 97760 Orthotic Initial, 97763 Orthotic/Prosthetic subsequent, energy conservation, patient/family education, and DME and/or AE instructions  RECOMMENDED OTHER SERVICES: N/A  CONSULTED AND AGREED WITH PLAN OF CARE: Patient  PLAN FOR NEXT SESSION: Treatment   Inocente Blazing, MS, OTR/L   11/18/2024, 4:32 PM           "

## 2024-11-21 ENCOUNTER — Ambulatory Visit

## 2024-11-21 NOTE — Telephone Encounter (Signed)
 Appeals submitted 1/22

## 2024-11-23 ENCOUNTER — Ambulatory Visit

## 2024-11-23 DIAGNOSIS — M6281 Muscle weakness (generalized): Secondary | ICD-10-CM | POA: Diagnosis not present

## 2024-11-23 DIAGNOSIS — R278 Other lack of coordination: Secondary | ICD-10-CM

## 2024-11-23 DIAGNOSIS — I63512 Cerebral infarction due to unspecified occlusion or stenosis of left middle cerebral artery: Secondary | ICD-10-CM

## 2024-11-23 NOTE — Therapy (Signed)
 " OUTPATIENT OCCUPATIONAL THERAPY NEURO TREATMENT NOTE  Patient Name: Ricardo Yang MRN: [id number] DOB:[birth year], 49 y.o., male Today's Date: 11/23/2024  REFERRING PROVIDER:  Lauraine Jonette BRAVO, PA-C  END OF SESSION:  OT End of Session - 11/23/24 1309     Visit Number 8    Number of Visits 24    Date for Recertification  01/09/25    Authorization Type [employer] [id number] for 24 OT vsts from 12/22-3/16/26 (Eval is included in this visit count d/t tx billed at eval)    Authorization Time Period Reporting period beginning 10/17/24    Authorization - Visit Number 8    Authorization - Number of Visits 24    Progress Note Due on Visit 10    OT Start Time 1315    OT Stop Time 1400    OT Time Calculation (min) 45 min    Equipment Utilized During Treatment none    Activity Tolerance Patient tolerated treatment well    Behavior During Therapy [facility] for tasks assessed/performed         Past Medical History:  Diagnosis Date   Acute CVA (cerebrovascular accident) (HCC) 10/04/2022   Acute ST elevation myocardial infarction (STEMI) involving left anterior descending (LAD) coronary artery (HCC) 08/06/2019   Anxiety 09/2022   After stroke   CAD (coronary artery disease) 10/04/2022   Cerebrovascular accident (CVA) (HCC) 10/07/2022   Chest pain    CHF (congestive heart failure) (HCC)    Cocaine abuse (HCC)    Cocaine use 10/06/2022   Depression December 2023   When stroke happened   Elevated troponin    Encounter for imaging to screen for metal prior to MRI 10/07/2022   Healthcare maintenance 10/07/2023   Heavy smoker    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 question possible MS   NSTEMI (non-ST elevated myocardial infarction) (HCC) 05/25/2015   Obesity    Polysubstance abuse (HCC)    a. tobacco, cocaine, crack, ecstasy, pills, anything but injections   Primary hypertension 10/05/2022   Sleep apnea    Always   STEMI (ST elevation myocardial  infarction) (HCC) 08/06/2019   Past Surgical History:  Procedure Laterality Date   CARDIAC CATHETERIZATION N/A 05/28/2015   Procedure: Left Heart Cath and Coronary Angiography;  Surgeon: Evalene JINNY Lunger, MD;  Location: [facility] INVASIVE CV LAB;  Service: Cardiovascular;  Laterality: N/A;   CORONARY ANGIOGRAPHY N/A 04/22/2021   Procedure: CORONARY ANGIOGRAPHY;  Surgeon: Ammon Blunt, MD;  Location: [facility] INVASIVE CV LAB;  Service: Cardiovascular;  Laterality: N/A;   CORONARY/GRAFT ACUTE MI REVASCULARIZATION N/A 08/06/2019   Procedure: Coronary/Graft Acute MI Revascularization;  Surgeon: Ammon Blunt, MD;  Location: [facility] INVASIVE CV LAB;  Service: Cardiovascular;  Laterality: N/A;   LEFT HEART CATH N/A 04/22/2021   Procedure: Left Heart Cath;  Surgeon: Ammon Blunt, MD;  Location: [facility] INVASIVE CV LAB;  Service: Cardiovascular;  Laterality: N/A;   LEFT HEART CATH AND CORONARY ANGIOGRAPHY N/A 08/06/2019   Procedure: LEFT HEART CATH AND CORONARY ANGIOGRAPHY;  Surgeon: Ammon Blunt, MD;  Location: [facility] INVASIVE CV LAB;  Service: Cardiovascular;  Laterality: N/A;   TEE WITHOUT CARDIOVERSION N/A 10/08/2022   Procedure: TRANSESOPHAGEAL ECHOCARDIOGRAM (TEE);  Surgeon: Darliss Rogue, MD;  Location: [facility] ORS;  Service: Cardiovascular;  Laterality: N/A;  11:30am   Patient Active Problem List   Diagnosis Date Noted   Chronic musculoskeletal pain 07/25/2024   Depression 05/25/2024   Biceps tendinitis, right 11/03/2023   Healthcare maintenance  10/07/2023   Sleep apnea 01/01/2023   Dysfunction of right rotator cuff 01/01/2023   History of CVA with residual deficit 12/04/2022   Mixed hyperlipidemia 12/04/2022   History of MI (myocardial infarction) 12/04/2022   Insomnia 10/17/2022   Slow transit constipation 10/17/2022   Osteoarthritis of right knee 10/17/2022   History of substance abuse (HCC) 10/15/2022   HFrEF (heart failure with reduced ejection fraction) (HCC)  10/07/2022   Expressive aphasia 10/05/2022   Right hemiplegia (HCC) 10/05/2022   CAD S/P percutaneous coronary angioplasty 10/05/2022   Polysubstance abuse (HCC) 10/04/2022   Essential hypertension 04/22/2021   Ischemic chest pain    ONSET DATE: 09/2022  REFERRING DIAG: CVA  THERAPY DIAG:  Muscle weakness (generalized)  Other lack of coordination  Left middle cerebral artery stroke (HCC)  Rationale for Evaluation and Treatment: Rehabilitation  SUBJECTIVE:  SUBJECTIVE STATEMENT: Pt requested OT tape shoulder to help with pain/discomfort.  See below. Pt accompanied by: self  PERTINENT HISTORY:  Pt. is a 49 y.o. male who has a history of an ischemic stroke in December of 2023 with  residual right hemiparesis and dysphasia. PMHx includes: arthritis-for which he has finished radiation treatments on his knees via radiation oncology services, major depressive disorder, shoulder ostearthritis, and heart failure with an EF of 25%.  PRECAUTIONS:  None  WEIGHT BEARING RESTRICTIONS: No  PAIN: 11/23/24: 3/10 R shoulder, reduced to 1/10 with activity (donning coat ) end of session with Ktape applied  Are you having pain? Arthritic pain in the right shoulder 1-2/10 during the eval  FALLS: Has patient fallen in last 6 months? No  LIVING ENVIRONMENT: Lives with: Resides alone Lives in: House/apartment-Resides in a [address]: one step  Has following equipment at home: Grab bars  PLOF: Independent  PATIENT GOALS:  Regain some everyday use with his right hand.  OBJECTIVE:  Note: Objective measures were completed at Evaluation unless otherwise noted.  HAND DOMINANCE: Right  ADLs:  Transfers/ambulation related to ADLs: Independent Eating: Uses the left hand,; tries to engage his right hand Grooming: holds the tooth brush with the right hand. UB Dressing: Uses his left hand using one armed dressing techniques LB Dressing: Uses the left hand; Pt. reports that he manages to  get it done. Toileting:  Pt. Uses his nondominant left hand Bathing:  Engages the right  hand to wash the left axilla region. Tub Shower transfers: Uses a shower chair Equipment:  grab bars shower, shower seat, cane  IADLs: Shopping: Uses the left hand Light housekeeping: Independent Meal Prep: Does not use dishes-uses paperware, plastic cups, and utensils. Community mobility:  Driving Medication management: Independent-stabilizes the bottle with the right hand, and uses the left hand to open Financial management: Reports having to plan things out more 2/2 with the right hand, and bilateral knee issues. Handwriting: 50% legible with left nondominant hand Typing: Is unable to type with the right hand-uses left hand Cellphone use: uses left hand- slower/more awkward Leisure/Hobbies: Video games Work History: professional Paediatric Nurse; cook  MOBILITY STATUS: Independent  FUNCTIONAL OUTCOME MEASURES: MAM-20: Eval: TBD   10/31/24: MAM-20 for musculoskeletal conditions: 10/26/24 (eval): 35/80 1= cannot do, 2=Very hard to do, 3=A little hard to do, 4=Easy to do Rating Choose only one number (definitions above)  1 Cut nails with a nail clipper  1 2. Tie shoes with laces  1 3. Cut meat on a plate   2 4. Wring a towel  3 5. Open a medicine bottle with a child  proof cap/top   1 6. Zip a jacket  1 7. Button clothes (medium sized buttons)  1 8. Write 3-4 lines legibly  2 9. Take things/cards out of a wallet   3 10. Open a wide-mouth jar or bottle previously opened   2 11. Handle/count monty (bills and coins)  3 12. Pick up a 1/2 full water pitcher  1 13. Turn key (to open a door)  1 14. Squeeze toothpaste onto a toothbrush  3 15. Use spoon or fork  2 16. Brush or comb hair  2 17. Dial or key in telephone numbers  1 18. Brush teeth   3 19. Wash hands  1 20. Use hand(s) to eat a sandwich   35/80      UPPER EXTREMITY ROM:    Active ROM Right Eval WFL Left Eval [facility]  Shoulder flexion     Shoulder abduction    Shoulder adduction    Shoulder extension    Shoulder internal rotation    Shoulder external rotation    Elbow flexion    Elbow extension    Wrist flexion    Wrist extension    Wrist ulnar deviation    Wrist radial deviation    Wrist pronation    Wrist supination    (Blank rows = not tested)  Active ROM Right Eval  Flex/ext Left Eval WFL  Thumb MCP (0-60)    Thumb IP (0-80) 55/35   Thumb Radial abd/add (0-55)     Thumb Palmar abd/add (0-45)     Thumb Opposition to Small Finger     Index MCP (0-90) Full/0    Index PIP (0-100) Full/40    Index DIP (0-70) Full/25     Long MCP (0-90)  Full/20    Long PIP (0-100) Full/25     Long DIP (0-70)  Full/15    Ring MCP (0-90) Full/25     Ring PIP (0-100) Full/20     Ring DIP (0-70)  Full/10    Little MCP (0-90)  Full/55    Little PIP (0-100)  Full/10    Little DIP (0-70) Full/0     (Blank rows = not tested)   10/17/2024: Pt. Is able to formulate a full composite fist with the right hand  UPPER EXTREMITY MMT:     MMT Right Eval  Left eval  Shoulder flexion 4- 5  Shoulder abduction 4- 5  Shoulder adduction    Shoulder extension    Shoulder internal rotation    Shoulder external rotation    Middle trapezius    Lower trapezius    Elbow flexion 4- 5  Elbow extension 4- 5  Wrist flexion    Wrist extension 4- 5  Wrist ulnar deviation    Wrist radial deviation    Wrist pronation    Wrist supination 3- 5  (Blank rows = not tested)  FUGL-MEYER ASSESSMENT   A. Upper Extremity   I. Reflex Activity:   11/07/24   Flexors (biceps) 2   Extensors (triceps) 2   Total 4/4                 II. Volitional Movement within Synergies:   11/07/24   Flexor Synergy:      Shoulder Elevation 1   Shoulder Retraction 2   Shoulder Abduction (at least 90*) 2   External Rotation 1   Elbow Flexion 2   Forearm Supination 1   Extensor Synergy:      Shoulder Adduction/Internal Rotation  2   Elbow Extension 2    Forearm Pronation 2   Total 15/18     III. Movement Combining Synergies:      Hand to Lumbar Spine 2   Shoulder Flexion at 90*, elbow at 0* 1   Pron/Sup w/elbow at 90* and shoulder at 0* 1 (lacking full supination)   Total 4/6     IV. Movement Out of Synergy      Shoulder Abduction to 90*, elbow at 0*, forearm pronated 2   Shoulder Flexion 90-180*, elbow at 0*, forearm in mid position 2 (limited by R shoulder pain, but forearm slightly over pronates/IR   Pron/sup, elbow at 0* and shoulder between 30-90* of flexion 1 (elbow flexes slightly and limited supination)   Total 5/6       B. Wrist      Stability of wrist at 15* ext, elbow at 90*, shoulder 0* 2   Repeated flex/ext, elbow at 90*, shoulder 0* 1   Stability of wrist at 15* ext, elbow at 0*, shoulder 30* 2   Flex/ext, elbow 0*, shoulder 30* 1   Circumduction  1   Total 7/10     C. Hand      Mass Flexion 1 (limited DIP flexion)    Mass Extension 1   Total 2/4       D. Grasp      Hook  0   Thumb Adduction - paper 1   Pincer - pen 0   Cylindrical - cup/can 2   Spherical - tennis ball 0 (2nd digit does not wrap around ball but does flex)   Total 3/10     E. Coordination/Speed      Tremor 1   Dysmetria 1   Time L 3.38, R 6.92 1   Total 3/6     FUGL-MEYER ASSESSMENT: UPPER EXTREMITY A-E TOTAL on (date): 43/66  HAND FUNCTION: Grip strength: Right: 20 lbs; Left: 110 lbs, Lateral pinch: Right: 5 lbs, Left: 18 lbs, and 3 point pinch: Right: unable lbs, Left: 16 lbs  COORDINATION: 9 Hole Peg Test:  Eval: Pt. Is unable to grasp/remove one vertical 9-Hole peg  SENSATION: Light touch: WFL Proprioception: WFL  EDEMA: N/A  MUSCLE TONE:  Flexor tone in the right hand  COGNITION: Overall cognitive status: Within functional limits for tasks assessed  VISION: Subjective report:  Requires readers now  PERCEPTION: Intact  PRAXIS:impaired                                                                                                                      TREATMENT DATE: 11/23/24 Neuro re-ed: -K-tape application for promoting wrist and digit ext: I strips applied to R hand/forearm for flexor tone reduction and to promote digit ext for digits 1-5, anchoring at the base of each nail bed, extending to mid dorsal forearm on ~75% stretch -Ktape application for R shoulder stability/weakness: Y strip anchored to R upper arm with Y tails directed around the deltoid at about 50%  stretch, and horizontal I strip anchored at anterior shoulder and inferior to clavicle, extending around to posterior shoulder.    Therapeutic Activity: -Facilitated R hand grasp/release task practice with formulation of lateral, 2 point, and 3 point pinch patterns to move wide base therapy resistant clothespins on/off a 1.5cm diameter vertical wooden dowel.  Initial reps required mod A for finger set up to formulate pinch patterns, with transition to donning oval 8 splints to L thumb and L IF to block IP flexion for easier ability to set up and sustain prehension on clips.  Intermittent min vc needed for self passive digit extension stretching or WB through the hand to normalize tone for improved pinch prehension.  Encouraged use of L hand as needed for R hand pinch set up.   Self Care: -General review of pt's shoulder MRI indicating some arthritis and tendinosis of rotator cuff, per MD record in chart.  Encouraged PT add on to address R shoulder pain and mobility to allow OT more focus on RUE neuro re-ed.  Pt in agreement with plan.  OT prepared PT order and front office will fax today to Dr. Kathlynn.  -Follow up re: phone call from [facility] nurse navigator.  Pt is scheduled to have phone call tomorrow.  Advised pt to notify mother to expect this call as mother's number is listed in pt's demographics which was sent to nurse.  Pt verbalized understanding.  -Reviewed recommendations for HEP when Metaflex glove, Ktape, and oval 8 splints are  donned: video games to promote thumb mobility, air writing alphabet with R thumb, grasp/release of household items to promote active digit ext  PATIENT EDUCATION: Education details: HEP review Person educated: Patient Education method: Explanation and Verbal cues Education comprehension: verbalized understanding, verbal cues required, and needs further education  HOME EXERCISE PROGRAM: R handed grasp/release task practice using Metaflex compression glove  GOALS: Goals reviewed with patient? Yes  SHORT TERM GOALS: Target date: 11/28/2024    Pt. Will be independent with HEP for RUE strength, and coordination skills. Baseline: Eval: No current HEP Goal status: INITIAL   LONG TERM GOALS: Target date: 01/09/2025   Pt. Will increase RUE strength by 2 muscle grades to assist with ADLs, and IADLs. Baseline: Eval:  RUE: shoulder flexion: 4-/5, abduction: 4-/5, elbow flexion: 4-/5, extension: 4-/5, wrist extension 4-/5, forearm supination: 3-/5 Goal status: INITIAL  2.  Pt. Will improve improve right digit extension to be able to independently assume a position of weightbearing, and proprioception through a flat hand. Baseline: Eval: Pt. Is initiating gross digit extension, however requires the assist of the left hand to assume a position of weightbearing. Goal status: INITIAL  3.  Pt. Will actively grasp and release 1 ADL objects efficiently 100% of the time with the right hand. Baseline: Eval: Pt. Is able to grasp  2 container, however is unable to actively release it from his hand Goal status: INITIAL  4.  Pt. Will increase right grip strength by 5# to be able to securely hold items of ADLs Baseline: Eval: Grip strength: Right: 20#, Left: 110  Goal status: INITIAL  5.  Pt. Will increase right lateral pinch strength by 2# to be able to hold, and efficiently use utensils Baseline: Eval:Lateral Pinch: Right: 5#, Left: 18# Pt. has difficulty efficiently holding, and using  utensils. Goal status: INITIAL  6.  Pt. Will identify/demonstrate adaptive/modifcations for ADLs/IADLs. Baseline: Eval: Education to be provided Goal status: INITIAL  ASSESSMENT:  CLINICAL IMPRESSION: Combination of Ktape to dorsal  R hand digits, donning Metaflex glove, and donning oval 8 splints to R thumb and R IF all continue to be beneficial to promote digit ext for grasp/release and pinching activities noted above. Without oval 8 splints, pt struggled to maintain prehension on clothespins, requiring mod A to set up pinch patterns and fingers subsequently slipping into PIP flexion.  With glove, Ktape, and oval 8 splints, pt was able to set up pinch patterns with occasional assist from the L hand, and was able to place and remove yellow clips (2 lbs) with fairly good efficiency, red clips (4 lbs) with increased effort, and green clips (6 lbs) with more effort and repeated attempts.  Initially attempted narrow base clothespins, and progressed to wide base (chip clips) for greater success.  Pt verbalized improved support and decreased pain with Ktape applied to R shoulder, noting decrease in shoulder pain from 3 to 1/10 when donning coat end of session after Ktape was applied.  Order sent to Dr Kathlynn for request for PT eval and tx for R shoulder rotator cuff tendinosis (per recent MRI) in order to allow OT to continue to focus on RUE neuro re-ed/functional use of the hand.  Pt will continue to benefit from OT services to work towards improving dominant RUE functioning in order increase engagement in, and maximize independence with ADLs, and IADLs.  [facility] Checklist Stroke Chronicity: >6 months post ischemic stroke  Affected Side: R upper extremity  Hand Dominance: R handed prior to CVA; currently uses L hand a majority of the time due to deficits with R dominant side.  Fugl-Meyer Score (UE):  43/66 (LEAPS Version) indicating moderate to severe impairment  ADL/IADL Status: Pt demonstrates  functional deficits with RUE ROM, coordination, and strength that negatively affects occupational performance and overall level of independence.    Currently, this pt is requiring increased time and compensation with use of non-dominant LUE to complete daily tasks (see Objective section for further details).  Spasticity: Spasticity is sufficiently managed with passive stretching to participate in high repetition therapy.  General Therapy History: Pt has been seen for RUE intervention in the outpatient setting(s) to address functional deficits following CVA.   Rehab Potential:  The patient is motivated to perform intensive outpatient occupational therapy.      PERFORMANCE DEFICITS: in functional skills including ADLs, IADLs, coordination, dexterity, ROM, strength, pain, Fine motor control, Gross motor control, and UE functional use, cognitive skills including, and psychosocial skills including coping strategies, environmental adaptation, and routines and behaviors.   IMPAIRMENTS: are limiting patient from ADLs, IADLs, and leisure.   CO-MORBIDITIES: may have co-morbidities  that affects occupational performance. Patient will benefit from skilled OT to address above impairments and improve overall function.  MODIFICATION OR ASSISTANCE TO COMPLETE EVALUATION: Min-Moderate modification of tasks or assist with assess necessary to complete an evaluation.  OT OCCUPATIONAL PROFILE AND HISTORY: Detailed assessment: Review of records and additional review of physical, cognitive, psychosocial history related to current functional performance.  CLINICAL DECISION MAKING: Moderate - several treatment options, min-mod task modification necessary  REHAB POTENTIAL: Good  EVALUATION COMPLEXITY: Moderate    PLAN:  OT FREQUENCY: 1-2x/week  OT DURATION: 12 weeks  PLANNED INTERVENTIONS: 97168 OT Re-evaluation, 97535 self care/ADL training, 02889 therapeutic exercise, 97530 therapeutic activity, 97112  neuromuscular re-education, 97140 manual therapy, 97018 paraffin, 02989 moist heat, 97034 contrast bath, 97760 Orthotic Initial, 97763 Orthotic/Prosthetic subsequent, energy conservation, patient/family education, and DME and/or AE instructions  RECOMMENDED OTHER SERVICES: N/A  CONSULTED AND AGREED WITH PLAN  OF CARE: Patient  PLAN FOR NEXT SESSION: Treatment   Inocente Blazing, MS, OTR/L   11/23/2024, 2:24 PM           "

## 2024-11-28 ENCOUNTER — Ambulatory Visit: Admitting: Occupational Therapy

## 2024-11-30 ENCOUNTER — Ambulatory Visit

## 2024-12-01 ENCOUNTER — Ambulatory Visit

## 2024-12-01 DIAGNOSIS — R2681 Unsteadiness on feet: Secondary | ICD-10-CM

## 2024-12-01 DIAGNOSIS — R279 Unspecified lack of coordination: Secondary | ICD-10-CM

## 2024-12-01 DIAGNOSIS — R2689 Other abnormalities of gait and mobility: Secondary | ICD-10-CM

## 2024-12-01 DIAGNOSIS — I63512 Cerebral infarction due to unspecified occlusion or stenosis of left middle cerebral artery: Secondary | ICD-10-CM

## 2024-12-01 DIAGNOSIS — M6281 Muscle weakness (generalized): Secondary | ICD-10-CM

## 2024-12-01 DIAGNOSIS — R278 Other lack of coordination: Secondary | ICD-10-CM

## 2024-12-01 NOTE — Therapy (Unsigned)
 " OUTPATIENT OCCUPATIONAL THERAPY NEURO TREATMENT NOTE  Patient Name: Ricardo Yang MRN: [id number] DOB:1976-08-09, 49 y.o., male Today's Date: 12/01/2024  REFERRING PROVIDER:  Lauraine Jonette BRAVO, PA-C  END OF SESSION:   Past Medical History:  Diagnosis Date   Acute CVA (cerebrovascular accident) (HCC) 10/04/2022   Acute ST elevation myocardial infarction (STEMI) involving left anterior descending (LAD) coronary artery (HCC) 08/06/2019   Anxiety 09/2022   After stroke   CAD (coronary artery disease) 10/04/2022   Cerebrovascular accident (CVA) (HCC) 10/07/2022   Chest pain    CHF (congestive heart failure) (HCC)    Cocaine abuse (HCC)    Cocaine use 10/06/2022   Depression December 2023   When stroke happened   Elevated troponin    Encounter for imaging to screen for metal prior to MRI 10/07/2022   Healthcare maintenance 10/07/2023   Heavy smoker    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 question possible MS   NSTEMI (non-ST elevated myocardial infarction) (HCC) 05/25/2015   Obesity    Polysubstance abuse (HCC)    a. tobacco, cocaine, crack, ecstasy, pills, anything but injections   Primary hypertension 10/05/2022   Sleep apnea    Always   STEMI (ST elevation myocardial infarction) (HCC) 08/06/2019   Past Surgical History:  Procedure Laterality Date   CARDIAC CATHETERIZATION N/A 05/28/2015   Procedure: Left Heart Cath and Coronary Angiography;  Surgeon: Evalene JINNY Lunger, MD;  Location: [facility];  Service: Cardiovascular;  Laterality: N/A;   CORONARY ANGIOGRAPHY N/A 04/22/2021   Procedure: CORONARY ANGIOGRAPHY;  Surgeon: Ammon Blunt, MD;  Location: [facility];  Service: Cardiovascular;  Laterality: N/A;   CORONARY/GRAFT ACUTE MI REVASCULARIZATION N/A 08/06/2019   Procedure: Coronary/Graft Acute MI Revascularization;  Surgeon: Ammon Blunt, MD;  Location: [facility];  Service:  Cardiovascular;  Laterality: N/A;   LEFT HEART CATH N/A 04/22/2021   Procedure: Left Heart Cath;  Surgeon: Ammon Blunt, MD;  Location: [facility];  Service: Cardiovascular;  Laterality: N/A;   LEFT HEART CATH AND CORONARY ANGIOGRAPHY N/A 08/06/2019   Procedure: LEFT HEART CATH AND CORONARY ANGIOGRAPHY;  Surgeon: Ammon Blunt, MD;  Location: [facility];  Service: Cardiovascular;  Laterality: N/A;   TEE WITHOUT CARDIOVERSION N/A 10/08/2022   Procedure: TRANSESOPHAGEAL ECHOCARDIOGRAM (TEE);  Surgeon: Darliss Rogue, MD;  Location: [facility];  Service: Cardiovascular;  Laterality: N/A;  11:30am   Patient Active Problem List   Diagnosis Date Noted   Chronic musculoskeletal pain 07/25/2024   Depression 05/25/2024   Biceps tendinitis, right 11/03/2023   Healthcare maintenance 10/07/2023   Sleep apnea 01/01/2023   Dysfunction of right rotator cuff 01/01/2023   History of CVA with residual deficit 12/04/2022   Mixed hyperlipidemia 12/04/2022   History of MI (myocardial infarction) 12/04/2022   Insomnia 10/17/2022   Slow transit constipation 10/17/2022   Osteoarthritis of right knee 10/17/2022   History of substance abuse (HCC) 10/15/2022   HFrEF (heart failure with reduced ejection fraction) (HCC) 10/07/2022   Expressive aphasia 10/05/2022   Right hemiplegia (HCC) 10/05/2022   CAD S/P percutaneous coronary angioplasty 10/05/2022   Polysubstance abuse (HCC) 10/04/2022   Essential hypertension 04/22/2021   Ischemic chest pain    ONSET DATE: 09/2022  REFERRING DIAG: CVA  THERAPY DIAG:  No diagnosis found.  Rationale for Evaluation and Treatment: Rehabilitation  SUBJECTIVE:  SUBJECTIVE STATEMENT: Pt requested OT tape shoulder to help with pain/discomfort.  See  below. Pt accompanied by: self  PERTINENT HISTORY:  Pt. is a 49 y.o. male who has a history of an ischemic stroke in December of 2023 with  residual right hemiparesis and dysphasia. PMHx  includes: arthritis-for which he has finished radiation treatments on his knees via radiation oncology services, major depressive disorder, shoulder ostearthritis, and heart failure with an EF of 25%.  PRECAUTIONS:  None  WEIGHT BEARING RESTRICTIONS: No  PAIN: 12/01/24: 1/10 R shoulder Are you having pain? Arthritic pain in the right shoulder 1-2/10 during the eval  FALLS: Has patient fallen in last 6 months? No  LIVING ENVIRONMENT: Lives with: Resides alone Lives in: House/apartment-Resides in a [id number]: one step  Has following equipment at home: Grab bars  PLOF: Independent  PATIENT GOALS:  Regain some everyday use with his right hand.  OBJECTIVE:  Note: Objective measures were completed at Evaluation unless otherwise noted.  HAND DOMINANCE: Right  ADLs:  Transfers/ambulation related to ADLs: Independent Eating: Uses the left hand,; tries to engage his right hand Grooming: holds the tooth brush with the right hand. UB Dressing: Uses his left hand using one armed dressing techniques LB Dressing: Uses the left hand; Pt. reports that he manages to get it done. Toileting:  Pt. Uses his nondominant left hand Bathing:  Engages the right  hand to wash the left axilla region. Tub Shower transfers: Uses a shower chair Equipment:  grab bars shower, shower seat, cane  IADLs: Shopping: Uses the left hand Light housekeeping: Independent Meal Prep: Does not use dishes-uses paperware, plastic cups, and utensils. Community mobility:  Driving Medication management: Independent-stabilizes the bottle with the right hand, and uses the left hand to open Financial management: Reports having to plan things out more 2/2 with the right hand, and bilateral knee issues. Handwriting: 50% legible with left nondominant hand Typing: Is unable to type with the right hand-uses left hand Cellphone use: uses left hand- slower/more awkward Leisure/Hobbies: Video games Work History:  professional Paediatric Nurse; cook  MOBILITY STATUS: Independent  FUNCTIONAL OUTCOME MEASURES: MAM-20: Eval: TBD   10/31/24: MAM-20 for musculoskeletal conditions: 10/26/24 (eval): 35/80 1= cannot do, 2=Very hard to do, 3=A little hard to do, 4=Easy to do Rating Choose only one number (definitions above)  1 Cut nails with a nail clipper  1 2. Tie shoes with laces  1 3. Cut meat on a plate   2 4. Wring a towel  3 5. Open a medicine bottle with a child proof cap/top   1 6. Zip a jacket  1 7. Button clothes (medium sized buttons)  1 8. Write 3-4 lines legibly  2 9. Take things/cards out of a wallet   3 10. Open a wide-mouth jar or bottle previously opened   2 11. Handle/count monty (bills and coins)  3 12. Pick up a 1/2 full water pitcher  1 13. Turn key (to open a door)  1 14. Squeeze toothpaste onto a toothbrush  3 15. Use spoon or fork  2 16. Brush or comb hair  2 17. Dial or key in telephone numbers  1 18. Brush teeth   3 19. Wash hands  1 20. Use hand(s) to eat a sandwich   35/80      UPPER EXTREMITY ROM:    Active ROM Right Eval WFL Left Eval [facility]  Shoulder flexion    Shoulder abduction    Shoulder adduction    Shoulder extension    Shoulder internal rotation    Shoulder  external rotation    Elbow flexion    Elbow extension    Wrist flexion    Wrist extension    Wrist ulnar deviation    Wrist radial deviation    Wrist pronation    Wrist supination    (Blank rows = not tested)  Active ROM Right Eval  Flex/ext Left Eval WFL  Thumb MCP (0-60)    Thumb IP (0-80) 55/35   Thumb Radial abd/add (0-55)     Thumb Palmar abd/add (0-45)     Thumb Opposition to Small Finger     Index MCP (0-90) Full/0    Index PIP (0-100) Full/40    Index DIP (0-70) Full/25     Long MCP (0-90)  Full/20    Long PIP (0-100) Full/25     Long DIP (0-70)  Full/15    Ring MCP (0-90) Full/25     Ring PIP (0-100) Full/20     Ring DIP (0-70)  Full/10    Little MCP (0-90)  Full/55     Little PIP (0-100)  Full/10    Little DIP (0-70) Full/0     (Blank rows = not tested)   10/17/2024: Pt. Is able to formulate a full composite fist with the right hand  UPPER EXTREMITY MMT:     MMT Right Eval  Left eval  Shoulder flexion 4- 5  Shoulder abduction 4- 5  Shoulder adduction    Shoulder extension    Shoulder internal rotation    Shoulder external rotation    Middle trapezius    Lower trapezius    Elbow flexion 4- 5  Elbow extension 4- 5  Wrist flexion    Wrist extension 4- 5  Wrist ulnar deviation    Wrist radial deviation    Wrist pronation    Wrist supination 3- 5  (Blank rows = not tested)  FUGL-MEYER ASSESSMENT   A. Upper Extremity   I. Reflex Activity:   11/07/24   Flexors (biceps) 2   Extensors (triceps) 2   Total 4/4                 II. Volitional Movement within Synergies:   11/07/24   Flexor Synergy:      Shoulder Elevation 1   Shoulder Retraction 2   Shoulder Abduction (at least 90*) 2   External Rotation 1   Elbow Flexion 2   Forearm Supination 1   Extensor Synergy:      Shoulder Adduction/Internal Rotation 2   Elbow Extension 2   Forearm Pronation 2   Total 15/18     III. Movement Combining Synergies:      Hand to Lumbar Spine 2   Shoulder Flexion at 90*, elbow at 0* 1   Pron/Sup w/elbow at 90* and shoulder at 0* 1 (lacking full supination)   Total 4/6     IV. Movement Out of Synergy      Shoulder Abduction to 90*, elbow at 0*, forearm pronated 2   Shoulder Flexion 90-180*, elbow at 0*, forearm in mid position 2 (limited by R shoulder pain, but forearm slightly over pronates/IR   Pron/sup, elbow at 0* and shoulder between 30-90* of flexion 1 (elbow flexes slightly and limited supination)   Total 5/6       B. Wrist      Stability of wrist at 15* ext, elbow at 90*, shoulder 0* 2   Repeated flex/ext, elbow at 90*, shoulder 0* 1   Stability of wrist at 15* ext,  elbow at 0*, shoulder 30* 2   Flex/ext, elbow 0*, shoulder 30* 1    Circumduction  1   Total 7/10     C. Hand      Mass Flexion 1 (limited DIP flexion)    Mass Extension 1   Total 2/4       D. Grasp      Hook  0   Thumb Adduction - paper 1   Pincer - pen 0   Cylindrical - cup/can 2   Spherical - tennis ball 0 (2nd digit does not wrap around ball but does flex)   Total 3/10     E. Coordination/Speed      Tremor 1   Dysmetria 1   Time L 3.38, R 6.92 1   Total 3/6     FUGL-MEYER ASSESSMENT: UPPER EXTREMITY A-E TOTAL on (date): 43/66  HAND FUNCTION: Grip strength: Right: 20 lbs; Left: 110 lbs, Lateral pinch: Right: 5 lbs, Left: 18 lbs, and 3 point pinch: Right: unable lbs, Left: 16 lbs  COORDINATION: 9 Hole Peg Test:  Eval: Pt. Is unable to grasp/remove one vertical 9-Hole peg  SENSATION: Light touch: WFL Proprioception: WFL  EDEMA: N/A  MUSCLE TONE:  Flexor tone in the right hand  COGNITION: Overall cognitive status: Within functional limits for tasks assessed  VISION: Subjective report:  Requires readers now  PERCEPTION: Intact  PRAXIS:impaired                                                                                                                     TREATMENT DATE: 11/23/24 Neuro re-ed: -K-tape application for promoting wrist and digit ext: I strips applied to R hand/forearm for flexor tone reduction and to promote digit ext for digits 1-5, anchoring at the base of each nail bed, extending to mid dorsal forearm on ~75% stretch -Ktape application for R shoulder stability/weakness: Y strip anchored to R upper arm with Y tails directed around the deltoid at about 50% stretch, and horizontal I strip anchored at anterior shoulder and inferior to clavicle, extending around to posterior shoulder.    Therapeutic Activity: -Facilitated R hand grasp/release task practice with formulation of lateral, 2 point, and 3 point pinch patterns to move wide base therapy resistant clothespins on/off a 1.5cm diameter vertical wooden  dowel.  Initial reps required mod A for finger set up to formulate pinch patterns, with transition to donning oval 8 splints to L thumb and L IF to block IP flexion for easier ability to set up and sustain prehension on clips.  Intermittent min vc needed for self passive digit extension stretching or WB through the hand to normalize tone for improved pinch prehension.  Encouraged use of L hand as needed for R hand pinch set up.   Self Care: -General review of pt's shoulder MRI indicating some arthritis and tendinosis of rotator cuff, per MD record in chart.  Encouraged PT add on to address R shoulder pain and mobility to allow OT more focus on  RUE neuro re-ed.  Pt in agreement with plan.  OT prepared PT order and front office will fax today to Dr. Kathlynn.  -Follow up re: phone call from [facility] nurse navigator.  Pt is scheduled to have phone call tomorrow.  Advised pt to notify mother to expect this call as mother's number is listed in pt's demographics which was sent to nurse.  Pt verbalized understanding.  -Reviewed recommendations for HEP when Metaflex glove, Ktape, and oval 8 splints are donned: video games to promote thumb mobility, air writing alphabet with R thumb, grasp/release of household items to promote active digit ext  PATIENT EDUCATION: Education details: HEP review Person educated: Patient Education method: Explanation and Verbal cues Education comprehension: verbalized understanding, verbal cues required, and needs further education  HOME EXERCISE PROGRAM: R handed grasp/release task practice using Metaflex compression glove  GOALS: Goals reviewed with patient? Yes  SHORT TERM GOALS: Target date: 11/28/2024    Pt. Will be independent with HEP for RUE strength, and coordination skills. Baseline: Eval: No current HEP Goal status: INITIAL   LONG TERM GOALS: Target date: 01/09/2025   Pt. Will increase RUE strength by 2 muscle grades to assist with ADLs, and IADLs. Baseline:  Eval:  RUE: shoulder flexion: 4-/5, abduction: 4-/5, elbow flexion: 4-/5, extension: 4-/5, wrist extension 4-/5, forearm supination: 3-/5 Goal status: INITIAL  2.  Pt. Will improve improve right digit extension to be able to independently assume a position of weightbearing, and proprioception through a flat hand. Baseline: Eval: Pt. Is initiating gross digit extension, however requires the assist of the left hand to assume a position of weightbearing. Goal status: INITIAL  3.  Pt. Will actively grasp and release 1 ADL objects efficiently 100% of the time with the right hand. Baseline: Eval: Pt. Is able to grasp  2 container, however is unable to actively release it from his hand Goal status: INITIAL  4.  Pt. Will increase right grip strength by 5# to be able to securely hold items of ADLs Baseline: Eval: Grip strength: Right: 20#, Left: 110  Goal status: INITIAL  5.  Pt. Will increase right lateral pinch strength by 2# to be able to hold, and efficiently use utensils Baseline: Eval:Lateral Pinch: Right: 5#, Left: 18# Pt. has difficulty efficiently holding, and using utensils. Goal status: INITIAL  6.  Pt. Will identify/demonstrate adaptive/modifcations for ADLs/IADLs. Baseline: Eval: Education to be provided Goal status: INITIAL  ASSESSMENT:  CLINICAL IMPRESSION: Combination of Ktape to dorsal R hand digits, donning Metaflex glove, and donning oval 8 splints to R thumb and R IF all continue to be beneficial to promote digit ext for grasp/release and pinching activities noted above. Without oval 8 splints, pt struggled to maintain prehension on clothespins, requiring mod A to set up pinch patterns and fingers subsequently slipping into PIP flexion.  With glove, Ktape, and oval 8 splints, pt was able to set up pinch patterns with occasional assist from the L hand, and was able to place and remove yellow clips (2 lbs) with fairly good efficiency, red clips (4 lbs) with increased effort, and  green clips (6 lbs) with more effort and repeated attempts.  Initially attempted narrow base clothespins, and progressed to wide base (chip clips) for greater success.  Pt verbalized improved support and decreased pain with Ktape applied to R shoulder, noting decrease in shoulder pain from 3 to 1/10 when donning coat end of session after Ktape was applied.  Order sent to Dr Kathlynn for request for PT  eval and tx for R shoulder rotator cuff tendinosis (per recent MRI) in order to allow OT to continue to focus on RUE neuro re-ed/functional use of the hand.  Pt will continue to benefit from OT services to work towards improving dominant RUE functioning in order increase engagement in, and maximize independence with ADLs, and IADLs.  [facility] Checklist Stroke Chronicity: >6 months post ischemic stroke  Affected Side: R upper extremity  Hand Dominance: R handed prior to CVA; currently uses L hand a majority of the time due to deficits with R dominant side.  Fugl-Meyer Score (UE):  43/66 (LEAPS Version) indicating moderate to severe impairment  ADL/IADL Status: Pt demonstrates functional deficits with RUE ROM, coordination, and strength that negatively affects occupational performance and overall level of independence.    Currently, this pt is requiring increased time and compensation with use of non-dominant LUE to complete daily tasks (see Objective section for further details).  Spasticity: Spasticity is sufficiently managed with passive stretching to participate in high repetition therapy.  General Therapy History: Pt has been seen for RUE intervention in the outpatient setting(s) to address functional deficits following CVA.   Rehab Potential:  The patient is motivated to perform intensive outpatient occupational therapy.      PERFORMANCE DEFICITS: in functional skills including ADLs, IADLs, coordination, dexterity, ROM, strength, pain, Fine motor control, Gross motor control, and UE functional use,  cognitive skills including, and psychosocial skills including coping strategies, environmental adaptation, and routines and behaviors.   IMPAIRMENTS: are limiting patient from ADLs, IADLs, and leisure.   CO-MORBIDITIES: may have co-morbidities  that affects occupational performance. Patient will benefit from skilled OT to address above impairments and improve overall function.  MODIFICATION OR ASSISTANCE TO COMPLETE EVALUATION: Min-Moderate modification of tasks or assist with assess necessary to complete an evaluation.  OT OCCUPATIONAL PROFILE AND HISTORY: Detailed assessment: Review of records and additional review of physical, cognitive, psychosocial history related to current functional performance.  CLINICAL DECISION MAKING: Moderate - several treatment options, min-mod task modification necessary  REHAB POTENTIAL: Good  EVALUATION COMPLEXITY: Moderate    PLAN:  OT FREQUENCY: 1-2x/week  OT DURATION: 12 weeks  PLANNED INTERVENTIONS: 97168 OT Re-evaluation, 97535 self care/ADL training, 02889 therapeutic exercise, 97530 therapeutic activity, 97112 neuromuscular re-education, 97140 manual therapy, 97018 paraffin, 02989 moist heat, 97034 contrast bath, 97760 Orthotic Initial, 97763 Orthotic/Prosthetic subsequent, energy conservation, patient/family education, and DME and/or AE instructions  RECOMMENDED OTHER SERVICES: N/A  CONSULTED AND AGREED WITH PLAN OF CARE: Patient  PLAN FOR NEXT SESSION: Treatment   Inocente Blazing, MS, OTR/L   12/01/2024, 1:22 PM       "

## 2024-12-01 NOTE — Therapy (Signed)
 " OUTPATIENT PHYSICAL THERAPY NEURO EVALUATION   Patient Name: Ricardo Yang MRN: [id number] DOB:09-13-1976, 49 y.o., male 17 Date: 12/01/2024   PCP: Alvia Selinda PARAS, MD  REFERRING PROVIDER: Kathlynn Sharper, MD   END OF SESSION:  PT End of Session - 12/01/24 1443     Visit Number 1    Date for Recertification  02/23/25    Progress Note Due on Visit 10    PT Start Time 1402    PT Stop Time 1442    PT Time Calculation (min) 40 min    Behavior During Therapy [facility] for tasks assessed/performed          Past Medical History:  Diagnosis Date   Acute CVA (cerebrovascular accident) (HCC) 10/04/2022   Acute ST elevation myocardial infarction (STEMI) involving left anterior descending (LAD) coronary artery (HCC) 08/06/2019   Anxiety 09/2022   After stroke   CAD (coronary artery disease) 10/04/2022   Cerebrovascular accident (CVA) (HCC) 10/07/2022   Chest pain    CHF (congestive heart failure) (HCC)    Cocaine abuse (HCC)    Cocaine use 10/06/2022   Depression December 2023   When stroke happened   Elevated troponin    Encounter for imaging to screen for metal prior to MRI 10/07/2022   Healthcare maintenance 10/07/2023   Heavy smoker    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 question possible MS   NSTEMI (non-ST elevated myocardial infarction) (HCC) 05/25/2015   Obesity    Polysubstance abuse (HCC)    a. tobacco, cocaine, crack, ecstasy, pills, anything but injections   Primary hypertension 10/05/2022   Sleep apnea    Always   STEMI (ST elevation myocardial infarction) (HCC) 08/06/2019   Past Surgical History:  Procedure Laterality Date   CARDIAC CATHETERIZATION N/A 05/28/2015   Procedure: Left Heart Cath and Coronary Angiography;  Surgeon: Evalene PARAS Lunger, MD;  Location: [facility] INVASIVE CV LAB;  Service: Cardiovascular;  Laterality: N/A;   CORONARY ANGIOGRAPHY N/A 04/22/2021   Procedure: CORONARY ANGIOGRAPHY;  Surgeon:  Ammon Blunt, MD;  Location: [facility] INV[facility]IVE CV [facility];  Service: Cardiovascular;  Laterality: N/A;   CORONARY/GRAFT ACUTE MI REVASCULARIZATION N/A 08/06/2019   Procedure: Coronary/Graft Acute MI Revascularization;  Surgeon: Ammon Blunt, MD;  Location: [facility] INV[facility] CV [facility];  Service: Cardiovascular;  Laterality: N/A;   LEFT HEART CATH N/A 04/22/2021   Procedure: Left Heart Cath;  Surgeon: Ammon Blunt, MD;  Location: [facility] INV[facility] CV [facility];  Service: Cardiovascular;  Laterality: N/A;   LEFT HEART CATH AND CORONARY ANGIOGRAPHY N/A 08/06/2019   Procedure: LEFT HEART CATH AND CORONARY ANGIOGRAPHY;  Surgeon: Ammon Blunt, MD;  Location: [facility] INV[facility]IVE CV [facility];  Service: Cardiovascular;  Laterality: N/A;   TEE WITHOUT CARDIOVERSION N/A 10/08/2022   Procedure: TRANSESOPHAGEAL ECHOCARDIOGRAM (TEE);  Surgeon: Darliss Rogue, MD;  Location: [facility] ORS;  Service: Cardiovascular;  Laterality: N/A;  11:30am   Patient Active Problem List   Diagnosis Date Noted   Chronic musculoskeletal pain 07/25/2024   Depression 05/25/2024   Biceps tendinitis, right 11/03/2023   Healthcare maintenance 10/07/2023   Sleep apnea 01/01/2023   Dysfunction of right rotator cuff 01/01/2023   History of CVA with residual deficit 12/04/2022   Mixed hyperlipidemia 12/04/2022   History of MI (myocardial infarction) 12/04/2022   Insomnia 10/17/2022   Slow transit constipation 10/17/2022   Osteoarthritis of right knee 10/17/2022   History of substance abuse (HCC) 10/15/2022   HFrEF (heart failure with reduced ejection  fraction) (HCC) 10/07/2022   Expressive aphasia 10/05/2022   Right hemiplegia (HCC) 10/05/2022   CAD S/P percutaneous coronary angioplasty 10/05/2022   Polysubstance abuse (HCC) 10/04/2022   Essential hypertension 04/22/2021   Ischemic chest pain     ONSET DATE: 3 years ago  REFERRING DIAG:  M67.911 (ICD-10-CM) - Disorder of rotator cuff, right  Z86.73 (ICD-10-CM) - History of  CVA (cerebrovascular accident)    THERAPY DIAG:  Muscle weakness (generalized)  Other lack of coordination  Left middle cerebral artery stroke (HCC)  Unsteadiness on feet  Balance disorder  Unspecified lack of coordination  Rationale for Evaluation and Treatment: Rehabilitation  SUBJECTIVE:                                                                                                                                                                                             SUBJECTIVE STATEMENT: Patient reports having a CVA about 3 years ago. He reports that since the stroke he has had R shoulder pain. He reports that he had imaging a few weeks ago and that OA was noted in the R shoulder. Patient's R hand was effected following stroke and he is able to grip some things that have larger diameter, but he is not able to pinch very well. Pt is also going to OT for the R hand and has been going to OT for about 6 weeks. Patient reports that R shoulder pain occurs when lifting the R arm up/out. He reports having just R shoulder discomfort when at rest.    Pt accompanied by: self  PERTINENT HISTORY: CVA 3 years ago resulting in hemiparesis on R side.  PAIN:  Are you having pain? Yes: NPRS scale: 1 Pain location: R shoulder Pain description: sharp  Aggravating factors: Lifting R arm.  Relieving factors: Hot shower, small movements to loosen it up.  PRECAUTIONS: None  RED FLAGS: None   WEIGHT BEARING RESTRICTIONS: No  FALLS: Has patient fallen in last 6 months? No  LIVING ENVIRONMENT: Lives with: lives alone Lives in: House/apartment Stairs: 2 to entry Has following equipment at home: None  PLOF: Independent  PATIENT GOALS: Decrease R shoulder pain.   OBJECTIVE:  Note: Objective measures were completed at Evaluation unless otherwise noted.  DIAGNOSTIC FINDINGS: IMPRESSION: 1. Mild supraspinatus and infraspinatus tendinosis. 2. Atrophy of the infraspinatus muscle.  No  intramuscular edema. 3. Moderate degenerative arthropathy of the acromioclavicular joint. Type 2/3 acromion. 4. Mild glenohumeral joint osteoarthritis.  COGNITION: Overall cognitive status: Within functional limits for tasks assessed   SENSATION: Light touch: Impaired R UE compared to L.    POSTURE: rounded shoulders  R shoulder:  AROM:   Flexion:140  Abduction: 118  IR: L5  ER: 30  L shoulder:   Flexion: 170  Abduction: 180  IR: T12  ER: 72  R shoulder MMT:    Flexion: 4-/5 MMT  Abduction: 4-/5 MMT  IR: 5/5 MMT  ER: 3/5 MMT    PATIENT SURVEYS:  UEFS  Extreme difficulty/unable (0), Quite a bit of difficulty (1), Moderate difficulty (2), Little difficulty (3), No difficulty (4) Survey date:    Any of your usual work, household or school activities   2. Your usual hobbies, recreational/sport activities    3. Lifting a bag of groceries to waist level    4. Lifting a bag of groceries above your head   5. Grooming your hair   6. Pushing up on your hands (I.e. from bathtub or chair)   7. Preparing food (I.e. peeling/cutting)   8. Driving    9. Vacuuming, sweeping, or raking   10. Dressing    11. Doing up buttons   12. Using tools/appliances   13. Opening doors   14. Cleaning    15. Tying or lacing shoes   16. Sleeping    17. Laundering clothes (I.e. washing, ironing, folding)   18. Opening a jar   19. Throwing a ball   20. Carrying a small suitcase with your affected limb.    Score total:  23/80                                                                                                                                 TREATMENT DATE: 12/01/2024    PATIENT EDUCATION: Education details: HEP Person educated: Patient Education method: Programmer, Multimedia, Facilities Manager, Verbal cues, and Handouts Education comprehension: verbalized understanding, returned demonstration, and verbal cues required  HOME EXERCISE PROGRAM: Access Code: [email or web address] URL:  [email or web address] Date: 12/01/2024 Prepared by: Norman Sharps  Exercises - Isometric Shoulder Flexion at Wall  - 1 x daily - 7 x weekly - 1 sets - 10 reps - 5 second hold - Standing Isometric Shoulder External Rotation with Doorway  - 1 x daily - 7 x weekly - 1 sets - 10 reps - 5 second hold - Isometric Shoulder Abduction at Wall  - 1 x daily - 7 x weekly - 1 sets - 10 reps - 5 second hold - Isometric Shoulder Extension at Wall  - 1 x daily - 7 x weekly - 1 sets - 10 reps - 5 second hold - Seated Shoulder Flexion Towel Slide at Table Top  - 1 x daily - 7 x weekly - 1 sets - 20 reps - 5 second hold  GOALS: Goals reviewed with patient? Yes  SHORT TERM GOALS: Target date: 12/15/2024  Patient will be independent with HEP by discharge allowing him to perform HEP with efficiency and increased safety at home. Baseline: HEP initiated at evaluation.  Goal status: INITIAL    LONG TERM GOALS: Target  date: 02/23/2025  1.  Patient will improve R shoulder flexion/abduction to 150 degrees or greater by discharge allowing him to reach objects above head.  Baseline: See table under AROM section. Goal status: INITIAL  2.  Patient will improve R shoulder strength to at least 4+/5 MMT by discharge allowing him to carry objects with decreased dificulty.  Baseline: See MMT table under Strength section. Goal status: INITIAL   3.  Patient will improve UEFI outcome score to 50/80 by discharge allowing him to perform ADLs/household chores with less difficulty.  Baseline: 23/80 Goal status: INITIAL  4.  Patient will increase R shoulder ER to 50 degrees or greater by discharge allowing him to bath/brush hair with less difficulty.  Baseline: 30 degrees Goal status: INITIAL  5.  Patient will report no greater than 3/10 pain at worst when lifting the R arm to perform tasks allowing him to perform tasks with R UE with less difficulty.  Baseline: Significant pain with lifting R arm.  Goal  status: INITIAL  ASSESSMENT:  CLINICAL IMPRESSION: Patient is a 49 y.o. male who was seen today for physical therapy evaluation and treatment for R shoulder pain/weakness. Patient reports having CVA 3 years ago causing hemiparesis to R side. He has since had minimal use of R UE resulting in decreased strength, decreased mobility, and pain with movement. MRI has been taken showing tendinosis in supraspinatus and mild OA in [facility] joint. Patient was noted to have decreased R shoulder AROM with goniometric measurements and decreased R shoulder strength with MMT making functional activities difficult/impossible to perform at this time. Patient is a good candidate for skilled PT 2x/week to improve on these deficits listed allowing him more functional mobility and decreased difficulty with ADLs.  OBJECTIVE IMPAIRMENTS: decreased ROM, decreased strength, impaired sensation, impaired UE functional use, postural dysfunction, and pain.   ACTIVITY LIMITATIONS: carrying, lifting, toileting, dressing, reach over head, and hygiene/grooming  PARTICIPATION LIMITATIONS: meal prep, cleaning, laundry, driving, community activity, and yard work  PERSONAL FACTORS: Past/current experiences and 3+ comorbidities: CVA, CAD, CHF are also affecting patient's functional outcome.   REHAB POTENTIAL: Good  CLINICAL DECISION MAKING: Stable/uncomplicated  EVALUATION COMPLEXITY: Moderate  PLAN:  PT FREQUENCY: 2x/week  PT DURATION: 12 weeks  PLANNED INTERVENTIONS: 97110-Therapeutic exercises, 97530- Therapeutic activity, [id number]- Neuromuscular re-education, 97535- Self Care, [id number]- Manual therapy, [phone]- Gait training, Patient/Family education, Balance training, Joint mobilization, and Joint manipulation  PLAN FOR NEXT SESSION: Review HEP and add AAROM.  Continue working to improve R shoulder strength and posture with progressive exercises to patient's tolerance level.    Norman KATHEE Sharps, PT 12/01/2024, 4:43 PM        "

## 2024-12-05 ENCOUNTER — Ambulatory Visit: Admitting: Occupational Therapy

## 2024-12-05 ENCOUNTER — Ambulatory Visit: Admitting: Physical Therapy

## 2024-12-07 ENCOUNTER — Ambulatory Visit: Admitting: Family

## 2024-12-07 ENCOUNTER — Ambulatory Visit: Admitting: Occupational Therapy

## 2024-12-12 ENCOUNTER — Ambulatory Visit: Admitting: Occupational Therapy

## 2024-12-15 ENCOUNTER — Ambulatory Visit

## 2024-12-19 ENCOUNTER — Ambulatory Visit: Admitting: Occupational Therapy

## 2024-12-19 ENCOUNTER — Ambulatory Visit

## 2024-12-20 ENCOUNTER — Ambulatory Visit

## 2024-12-21 ENCOUNTER — Ambulatory Visit

## 2024-12-21 ENCOUNTER — Ambulatory Visit: Admitting: Physical Therapy

## 2024-12-22 ENCOUNTER — Ambulatory Visit

## 2024-12-23 ENCOUNTER — Ambulatory Visit

## 2024-12-26 ENCOUNTER — Ambulatory Visit

## 2024-12-28 ENCOUNTER — Ambulatory Visit

## 2024-12-29 ENCOUNTER — Ambulatory Visit: Admitting: Occupational Therapy

## 2025-01-03 ENCOUNTER — Ambulatory Visit

## 2025-01-05 ENCOUNTER — Ambulatory Visit: Admitting: Occupational Therapy

## 2025-01-09 ENCOUNTER — Ambulatory Visit

## 2025-01-24 ENCOUNTER — Ambulatory Visit: Admitting: Family

## 2025-04-05 ENCOUNTER — Ambulatory Visit: Admitting: Radiation Oncology
